# Patient Record
Sex: Female | Born: 1935
Health system: Southern US, Community
[De-identification: ages and names within clinical notes are randomized; demographics above are authoritative.]

## PROBLEM LIST (undated history)

## (undated) DIAGNOSIS — E039 Hypothyroidism, unspecified: Secondary | ICD-10-CM

## (undated) DIAGNOSIS — F0390 Unspecified dementia without behavioral disturbance: Secondary | ICD-10-CM

## (undated) DIAGNOSIS — R0989 Other specified symptoms and signs involving the circulatory and respiratory systems: Secondary | ICD-10-CM

## (undated) DIAGNOSIS — I10 Essential (primary) hypertension: Secondary | ICD-10-CM

## (undated) DIAGNOSIS — E7800 Pure hypercholesterolemia, unspecified: Secondary | ICD-10-CM

## (undated) HISTORY — DX: Other specified symptoms and signs involving the circulatory and respiratory systems: R09.89

## (undated) HISTORY — PX: CARPAL TUNNEL RELEASE: SHX101

## (undated) HISTORY — DX: Essential (primary) hypertension: I10

## (undated) HISTORY — DX: Unspecified dementia, unspecified severity, without behavioral disturbance, psychotic disturbance, mood disturbance, and anxiety: F03.90

## (undated) HISTORY — DX: Hypothyroidism, unspecified: E03.9

## (undated) HISTORY — DX: Pure hypercholesterolemia, unspecified: E78.00

---

## 1989-01-27 HISTORY — PX: CHOLECYSTECTOMY: SHX55

## 2004-05-01 ENCOUNTER — Ambulatory Visit: Payer: Self-pay | Admitting: Internal Medicine

## 2004-05-02 ENCOUNTER — Ambulatory Visit: Payer: Self-pay | Admitting: Internal Medicine

## 2005-04-23 ENCOUNTER — Ambulatory Visit: Payer: Self-pay | Admitting: Internal Medicine

## 2005-05-06 ENCOUNTER — Ambulatory Visit: Payer: Self-pay | Admitting: Internal Medicine

## 2006-05-08 ENCOUNTER — Ambulatory Visit: Payer: Self-pay | Admitting: Internal Medicine

## 2006-06-11 ENCOUNTER — Ambulatory Visit: Payer: Self-pay | Admitting: Gastroenterology

## 2007-01-31 ENCOUNTER — Emergency Department: Payer: Self-pay | Admitting: Emergency Medicine

## 2007-05-11 ENCOUNTER — Ambulatory Visit: Payer: Self-pay | Admitting: Internal Medicine

## 2008-05-18 ENCOUNTER — Ambulatory Visit: Payer: Self-pay | Admitting: Internal Medicine

## 2008-05-23 ENCOUNTER — Ambulatory Visit: Payer: Self-pay | Admitting: Internal Medicine

## 2008-08-24 ENCOUNTER — Ambulatory Visit: Payer: Self-pay | Admitting: Internal Medicine

## 2009-01-30 ENCOUNTER — Emergency Department: Payer: Self-pay | Admitting: Emergency Medicine

## 2009-08-15 ENCOUNTER — Emergency Department: Payer: Self-pay | Admitting: Unknown Physician Specialty

## 2009-12-03 LAB — HM PAP SMEAR: HM Pap smear: NEGATIVE

## 2009-12-11 ENCOUNTER — Ambulatory Visit: Payer: Self-pay | Admitting: Internal Medicine

## 2009-12-13 ENCOUNTER — Ambulatory Visit: Payer: Self-pay | Admitting: Internal Medicine

## 2010-12-30 ENCOUNTER — Ambulatory Visit: Payer: Self-pay | Admitting: Internal Medicine

## 2011-06-05 ENCOUNTER — Emergency Department: Payer: Self-pay | Admitting: Emergency Medicine

## 2011-06-05 LAB — CBC
HCT: 41.4 % (ref 35.0–47.0)
HGB: 13.5 g/dL (ref 12.0–16.0)
MCH: 28.1 pg (ref 26.0–34.0)
MCHC: 32.5 g/dL (ref 32.0–36.0)
MCV: 86 fL (ref 80–100)
Platelet: 245 10*3/uL (ref 150–440)
RBC: 4.79 10*6/uL (ref 3.80–5.20)
RDW: 14 % (ref 11.5–14.5)

## 2011-06-05 LAB — URINALYSIS, COMPLETE
Bilirubin,UR: NEGATIVE
Blood: NEGATIVE
Nitrite: NEGATIVE
Protein: 100

## 2011-06-05 LAB — COMPREHENSIVE METABOLIC PANEL
Albumin: 3.4 g/dL (ref 3.4–5.0)
Alkaline Phosphatase: 104 U/L (ref 50–136)
Anion Gap: 7 (ref 7–16)
BUN: 13 mg/dL (ref 7–18)
Calcium, Total: 9 mg/dL (ref 8.5–10.1)
Co2: 27 mmol/L (ref 21–32)
EGFR (African American): >60
EGFR (Non-African Amer.): >60
Glucose: 137 mg/dL — ABNORMAL HIGH (ref 65–99)
Osmolality: 285 (ref 275–301)
Potassium: 4.2 mmol/L (ref 3.5–5.1)
SGPT (ALT): 17 U/L

## 2011-06-17 ENCOUNTER — Ambulatory Visit: Payer: Self-pay | Admitting: Internal Medicine

## 2011-10-30 ENCOUNTER — Emergency Department: Payer: Self-pay | Admitting: Emergency Medicine

## 2011-10-30 ENCOUNTER — Telehealth: Payer: Self-pay | Admitting: Internal Medicine

## 2011-10-30 DIAGNOSIS — R55 Syncope and collapse: Secondary | ICD-10-CM

## 2011-10-30 NOTE — Telephone Encounter (Signed)
Did EMS take her to er.  If so, need records.  What are specific symptoms now?  How has her blood pressure been running.  Is she still seeing neurology/cardiology?  If so are they aware of this reoccurrence.  Thanks.

## 2011-10-30 NOTE — Telephone Encounter (Signed)
Pt's husband called back he's says that EMS did not take her to ER, She is not having any symptoms as of right now. It only lasted about 10 to 15 min when she was passed out. Her b/p has been around 151/75. She hasn't seen Neuro dr. In a while. She isn't seeing a cardiologist.

## 2011-10-30 NOTE — Telephone Encounter (Signed)
Pt says she had an episode on Monday. She passed out and was saying how bad she felt and she doesn't remember any of it. They called EMS but they were wanting to see someone about this.

## 2011-10-30 NOTE — Telephone Encounter (Signed)
She has had reoccurring episodes like this.  I have previously ordered cardiac testing and holter.  She saw her neurologist - no etiology found.  I would like for her to see a cardiologist asap - (for reoccurring syncope, etc).  If any reoccurring episodes - she needs eval immediately.  Thanks.  If any problems, let us know.

## 2011-10-30 NOTE — Telephone Encounter (Signed)
Left a message for patient to return call.

## 2011-10-31 NOTE — Telephone Encounter (Signed)
Patients spouse advised via telephone, he stated that she hasn't had another syncope episode.  They would like to see the same Cardiologist that she has seen in the past.  They will wait to hear from [place] next week.

## 2011-11-03 NOTE — Telephone Encounter (Signed)
Order for cardiology referral placed.  Refer to Dr Gwen Pounds.

## 2011-11-04 ENCOUNTER — Inpatient Hospital Stay: Payer: Self-pay | Admitting: Internal Medicine

## 2011-11-04 ENCOUNTER — Telehealth: Payer: Self-pay | Admitting: Internal Medicine

## 2011-11-04 LAB — URINALYSIS, COMPLETE
Bacteria: NONE SEEN
Bilirubin,UR: NEGATIVE
Blood: NEGATIVE
Glucose,UR: NEGATIVE mg/dL (ref 0–75)
Ketone: NEGATIVE
Leukocyte Esterase: NEGATIVE
Ph: 7 (ref 4.5–8.0)
RBC,UR: 1 /HPF (ref 0–5)
Specific Gravity: 1.01 (ref 1.003–1.030)
Squamous Epithelial: 8

## 2011-11-04 LAB — COMPREHENSIVE METABOLIC PANEL
Albumin: 3 g/dL — ABNORMAL LOW (ref 3.4–5.0)
Alkaline Phosphatase: 100 U/L (ref 50–136)
Anion Gap: 7 (ref 7–16)
BUN: 14 mg/dL (ref 7–18)
Bilirubin,Total: 0.4 mg/dL (ref 0.2–1.0)
Calcium, Total: 9.1 mg/dL (ref 8.5–10.1)
Chloride: 107 mmol/L (ref 98–107)
Co2: 29 mmol/L (ref 21–32)
Creatinine: 0.97 mg/dL (ref 0.60–1.30)
EGFR (African American): >60
EGFR (Non-African Amer.): 57 — ABNORMAL LOW
Glucose: 103 mg/dL — ABNORMAL HIGH (ref 65–99)
Osmolality: 286 (ref 275–301)
Potassium: 4.2 mmol/L (ref 3.5–5.1)
SGOT(AST): 24 U/L (ref 15–37)
SGPT (ALT): 17 U/L (ref 12–78)
Sodium: 143 mmol/L (ref 136–145)
Total Protein: 7.2 g/dL (ref 6.4–8.2)

## 2011-11-04 LAB — CBC
HCT: 43.9 % (ref 35.0–47.0)
HGB: 14.5 g/dL (ref 12.0–16.0)
MCH: 28.1 pg (ref 26.0–34.0)
MCV: 85 fL (ref 80–100)
RBC: 5.16 10*6/uL (ref 3.80–5.20)

## 2011-11-04 LAB — TROPONIN I
Troponin-I: 0.1 ng/mL — ABNORMAL HIGH
Troponin-I: 0.09 ng/mL — ABNORMAL HIGH

## 2011-11-04 NOTE — Telephone Encounter (Signed)
Mr Amanda [name] came in today to let you know that Ms Amanda [name] had another spell this morning and he had to call ems ,  They came out twice today and once last week,.  The last time that ems came today they took her to [facility]  They admitted her and mr Amanda [name] stated that dr thought he might have been a slight heart attack.  Ms Amanda [name] is in room 421.  It looks like ms Amanda [name] has appointment with [facility] on 11/11/11.  Mr Amanda [name] wanted [place] to cancel this appointment.  Mr Amanda [name] wanted to make sure this was ok to cancel with you.

## 2011-11-04 NOTE — Telephone Encounter (Signed)
Pt in room 241.  Spoke to pt and her husband.  Medicine is planning for a cardiology consult while she is in the hospital.  Will keep scheduled appt for now.  Will keep me posted regarding w/up.

## 2011-11-05 ENCOUNTER — Telehealth: Payer: Self-pay | Admitting: Internal Medicine

## 2011-11-05 DIAGNOSIS — R55 Syncope and collapse: Secondary | ICD-10-CM

## 2011-11-05 LAB — TSH: Thyroid Stimulating Horm: 3.11 u[IU]/mL

## 2011-11-05 LAB — LIPID PANEL
Cholesterol: 194 mg/dL (ref 0–200)
Ldl Cholesterol, Calc: 128 mg/dL — ABNORMAL HIGH (ref 0–100)
Triglycerides: 161 mg/dL (ref 0–200)
VLDL Cholesterol, Calc: 32 mg/dL (ref 5–40)

## 2011-11-05 NOTE — Telephone Encounter (Signed)
Hospital follow up 10.24.13 @ 2:00.

## 2011-11-06 ENCOUNTER — Telehealth: Payer: Self-pay | Admitting: Internal Medicine

## 2011-11-06 NOTE — Telephone Encounter (Signed)
Pt was recently admitted.  She is already scheduled for a hospital follow up with me.

## 2011-11-10 ENCOUNTER — Telehealth: Payer: Self-pay | Admitting: Internal Medicine

## 2011-11-10 NOTE — Telephone Encounter (Signed)
If she is ok to wait until tomorrow - I will see her at 11:30 tomorrow.  Nikki please find out if pt ok to wait - she was just in the hospital.  If ok to wait, then can forward to schedule appt tomorrow.  If unable to wait, she can go to acute care today.  Thanks.

## 2011-11-10 NOTE — Telephone Encounter (Signed)
Pt has UTI and is wanting to know if she should be seen or if she could have something called in to [facility].

## 2011-11-10 NOTE — Telephone Encounter (Signed)
Caller: Crista/Patient; Patient Name: Amanda [name]; PCP: [facility]; Best Callback Phone Number: [phone]; Call regarding Urinary burning, onset 10-12.  All emergent symptoms ruled out per  Urinary Symptoms protocol, see in 24 hrs, due to one or more UTI symptoms and has not been previously evaluated.  No appts on 10-14 or 10-15.  Pt uses [facility], [place], [phone].  Please follow up with Pt.

## 2011-11-10 NOTE — Telephone Encounter (Signed)
Patient and husband notified as instructed by telephone. Was advised by patient's husband that he has an appointment scheduled in the morning with his cancer doctor, but will try to make the 11:30 appointment.

## 2011-11-10 NOTE — Telephone Encounter (Signed)
Patient has an appointment schedule tomorrow at 11:30.

## 2011-11-11 ENCOUNTER — Encounter: Payer: Self-pay | Admitting: Internal Medicine

## 2011-11-11 ENCOUNTER — Ambulatory Visit (INDEPENDENT_AMBULATORY_CARE_PROVIDER_SITE_OTHER): Payer: Medicare Other | Admitting: Internal Medicine

## 2011-11-11 VITALS — BP 122/70 | HR 87 | Temp 97.8°F | Wt 155.0 lb

## 2011-11-11 DIAGNOSIS — E7800 Pure hypercholesterolemia, unspecified: Secondary | ICD-10-CM

## 2011-11-11 DIAGNOSIS — R300 Dysuria: Secondary | ICD-10-CM

## 2011-11-11 DIAGNOSIS — I10 Essential (primary) hypertension: Secondary | ICD-10-CM

## 2011-11-11 DIAGNOSIS — F0390 Unspecified dementia without behavioral disturbance: Secondary | ICD-10-CM

## 2011-11-11 DIAGNOSIS — E039 Hypothyroidism, unspecified: Secondary | ICD-10-CM

## 2011-11-11 LAB — POCT URINALYSIS DIPSTICK
Bilirubin, UA: NEGATIVE
Ketones, UA: NEGATIVE
Nitrite, UA: NEGATIVE
pH, UA: 7

## 2011-11-11 MED ORDER — NITROFURANTOIN MONOHYD MACRO 100 MG PO CAPS: 100.0000 mg | ORAL_CAPSULE | Freq: Two times a day (BID) | ORAL | Status: DC

## 2011-11-11 MED ORDER — NYSTATIN 100000 UNIT/GM EX CREA: TOPICAL_CREAM | Freq: Two times a day (BID) | CUTANEOUS | Status: DC

## 2011-11-11 NOTE — Patient Instructions (Signed)
It was nice seeing you today.  I am sorry you have been having some problems.  I am going to give you an antibiotic today (macrobid).  Take in twice a day.  I also want you to use some nystatin cream on the outside of the vagina to help with the itching.  Let me know if persistent problems.

## 2011-11-12 ENCOUNTER — Ambulatory Visit: Payer: Self-pay | Admitting: Vascular Surgery

## 2011-11-13 ENCOUNTER — Encounter: Payer: Self-pay | Admitting: Internal Medicine

## 2011-11-13 DIAGNOSIS — I10 Essential (primary) hypertension: Secondary | ICD-10-CM | POA: Insufficient documentation

## 2011-11-13 DIAGNOSIS — F0390 Unspecified dementia without behavioral disturbance: Secondary | ICD-10-CM | POA: Insufficient documentation

## 2011-11-13 DIAGNOSIS — E039 Hypothyroidism, unspecified: Secondary | ICD-10-CM | POA: Insufficient documentation

## 2011-11-13 DIAGNOSIS — E7800 Pure hypercholesterolemia, unspecified: Secondary | ICD-10-CM | POA: Insufficient documentation

## 2011-11-13 NOTE — Assessment & Plan Note (Signed)
Seeing neurology.  Continue same med regimen.  Appears to be relatively stable at this point.

## 2011-11-13 NOTE — Assessment & Plan Note (Signed)
Continues on Atorvastatin.  Low cholesterol diet.  Follow lipid panel and liver function.

## 2011-11-13 NOTE — Assessment & Plan Note (Signed)
Continue current thyroid medication.  Follow tsh.

## 2011-11-13 NOTE — Assessment & Plan Note (Signed)
Blood pressure averaging in the 140s (systolid) at home.  138 systolic here (on my check).  Continue current meds as she is doing.  Follow blood pressure.

## 2011-11-13 NOTE — Progress Notes (Signed)
Subjective:    Patient ID: Amanda [name], female    DOB: Mar 17, 1933, 78 y.o.   MRN: [id number]  HPI 78 year old female with past history of hypertension and hypercholesterolemia who comes in today as a work in with concerns regarding a possible urinary tract infection.  These symptoms started 4-5 days ago.  Describes dysuria and some itching.  No discharge.  No intravaginal symptoms.  No hematuria.  She did take AZO prior to coming in.  No abdominal pain or back pain.    She was also recently admitted to [facility] for syncope(?).  Her husband accompanies her.  History obtained from both of them.  She has been having reoccurring episodes (in the morning), where she will start "feeling funny" and "not feeling well".  Will lose consciousness and her "breathing changes" according to her husband.  She saw her neurologist.  Did not feel there was an underlying neurological cause.  Back in the spring, she had a cardiac w/up.  ECHO and stress test unrevealing.  Had a CTA then - did not show a significant blockage.  Recently had more episodes and was admitted - last week.  Dr Mariah Milling evaluated her while in the hospital.  Had a repeat ECHO.  Stress test- not felt warranted at that time.  She apparently did have a follow up carotid ultrasound and is planning to have a follow up CTA tomorrow.  Seeing Dr Wyn Quaker.  Husband reports that the feeling was that these episodes were secondary to blockage and delayed flow to her brain.  These episodes occur in the am.  Husband reports that during the day - she is fine.  Stays active.  Blood pressure now averaging 140s systolic.    Past Medical History  Diagnosis Date  . Hypertension   . Hypercholesterolemia   . Hypothyroidism   . Dementia     Review of Systems Patient denies any headache, lightheadedness or dizziness currently.  No chest pain, tightness or palpitations now.  When these episodes occur, she does notice some chest tightness.  No increased shortness of breath, cough or  congestion.  No nausea or vomiting.  No acid reflux.  No abdominal pain or cramping.  No bowel change, such as diarrhea, constipation, BRBPR or melana.  Urinary symptoms as outlined.  No vaginal symptoms.  Eating and drinking well.         Objective:   Physical Exam Filed Vitals:   11/11/11 1125  BP: 122/70  Pulse: 87  Temp: 97.8 F (41.41 C)   78 year old female in no acute distress.   HEENT:  Nares - clear.  OP- without lesions or erythema.  NECK:  Supple, nontender.   HEART:  Appears to be regular. LUNGS:  Without crackles or wheezing audible.  Respirations even and unlabored.   RADIAL PULSE:  Equal bilaterally.  ABDOMEN:  Soft, nontender.  No audible abdominal bruit.   BACK:  Nontender.  No CVA tenderness.  EXTREMITIES:  No increased edema to be present.                     Assessment & Plan:  UTI.  Urine dip revealed trace leukocytes and trace blood.  Will send for culture.  She is on Amoxicillin for her mouth.  She only has a few pills left.  Will stop the Amoxicillin and start Macrobid 100mg  bid to cover for possible uti.  Fluids.  Explained to her if symptoms changed, worsened or did not resolve, she  was to be reevaluated.    "SPELLS".  Unclear as to the exact etiology.  See above for outline regarding previous w/up.  Her neurologist did not feel these were coming from an underlying neurological etiology.  Previous cardiac w/up negative.  Dr Mariah Milling evaluated her while she was in the hospital.  Had repeat ECHO.  No significant arrhythmia noted while wearing the monitor.  Stress test not felt warranted.  Did have a carotid ultrasound (which per husband did reveal "some blockage").  Planning to have  CTA tomorrow and follow up with Dr Wyn Quaker after the scan.  Continue daily aspirin.  Continue current meds as she is doing.

## 2011-11-14 LAB — URINE CULTURE: Colony Count: 3000

## 2011-11-20 ENCOUNTER — Ambulatory Visit (INDEPENDENT_AMBULATORY_CARE_PROVIDER_SITE_OTHER): Payer: Medicare Other | Admitting: Internal Medicine

## 2011-11-20 ENCOUNTER — Encounter: Payer: Self-pay | Admitting: Internal Medicine

## 2011-11-20 VITALS — BP 140/70 | HR 104 | Temp 97.9°F | Ht 63.0 in | Wt 154.8 lb

## 2011-11-20 DIAGNOSIS — F0390 Unspecified dementia without behavioral disturbance: Secondary | ICD-10-CM

## 2011-11-20 DIAGNOSIS — E039 Hypothyroidism, unspecified: Secondary | ICD-10-CM

## 2011-11-20 DIAGNOSIS — I10 Essential (primary) hypertension: Secondary | ICD-10-CM

## 2011-11-20 DIAGNOSIS — R55 Syncope and collapse: Secondary | ICD-10-CM

## 2011-11-20 MED ORDER — AMLODIPINE BESYLATE 5 MG PO TABS: 5.0000 mg | ORAL_TABLET | Freq: Every day | ORAL | Status: DC

## 2011-11-20 MED ORDER — LEVOTHYROXINE SODIUM 75 MCG PO TABS: 37.5000 ug | ORAL_TABLET | Freq: Every day | ORAL | Status: DC

## 2011-11-20 MED ORDER — LISINOPRIL 10 MG PO TABS: 10.0000 mg | ORAL_TABLET | Freq: Every day | ORAL | Status: DC

## 2011-11-20 NOTE — Patient Instructions (Addendum)
It was nice seeing you again today.  I am glad you have been doing better.  Let us know if any problems.  We will schedule an appt with neurology.

## 2011-11-26 ENCOUNTER — Encounter: Payer: Self-pay | Admitting: Internal Medicine

## 2011-11-26 NOTE — Assessment & Plan Note (Signed)
Blood pressure as outlined.  Continue same med regimen.

## 2011-11-26 NOTE — Assessment & Plan Note (Signed)
Remains on Synthroid.  Follow.   

## 2011-11-26 NOTE — Progress Notes (Signed)
  Subjective:    Patient ID: Amanda [name], female    DOB: 1933-12-15, 78 y.o.   MRN: [id number]  HPI 78 year old female with past history of hypertension, hypercholesterolemia and hypothyroidism who comes in today for a scheduled follow up.  She is accompanied by her husband.  History obtained from both of them.  Was recently admitted to the hospital for continued spells of unresponsiveness.  See last note for details.  Had a CTA of her neck after discharge.  Followed up with vascular surgery.  States everything checked out fine.  Did recently see Dr Gwen Pounds.  Is scheduled to have a stress test next week.  Since her last visit here, she has not had any other "spells".  Has done well.  Feels good.  Breathing stable.  No chest pain or tightness with increased activity or exertion.  Eating and drinking well.  No urinary symptoms now.  Previous symptoms have resolved.  Past Medical History  Diagnosis Date  . Hypertension   . Hypercholesterolemia   . Hypothyroidism   . Dementia     Review of Systems Patient denies any headache, lightheadedness or dizziness.  No chest pain, tightness or palpatations. No increased shortness of breath, cough or congestion.  No acid reflux.  No nausea or vomiting.  No abdominal pain or cramping.  No bowel change, such as diarrhea, constipation, BRBPR or melana.  No urine change.  Specifically denies any dysuria or increased frequency.  Blood pressure has been averaging 140s systolic readings.      Objective:   Physical Exam Filed Vitals:   11/20/11 1422  BP: 140/70  Pulse: 104  Temp: 97.9 F (67.20 C)   78 year old female in no acute distress.   HEENT:  Nares - clear.  OP- without lesions or erythema.  NECK:  Supple, nontender.     HEART:  Appears to be regular. LUNGS:  Without crackles or wheezing audible.  Respirations even and unlabored.   RADIAL PULSE:  Equal bilaterally.  ABDOMEN:  Soft, nontender.  No audible abdominal bruit.   EXTREMITIES:  No increased  edema to be present.      Assessment & Plan:  PERIODS OF UNRESPONSIVE.  Has had cardiac and vascular w/up.  CTA revealed no significant stenosis.  Recent ECHO and previous stress test unrevealing.  Saw Dr Gwen Pounds and is scheduled to have a follow up stress test next week.  These episodes occur in the am.  Have been reoccurring, however she has not had any since her last visit here.  Prior to her admission and a couple of days after her admission, they were occurring frequently.  Blood pressure medicine has been adjusted and it appears now her systolic readings are averaging in the 140s.  (not too low and not too high).  Have asked her husband to continue to monitor her blood pressure and let me know if extreme fluctuations. Still do no have a good answer as to the etiology of the episodes.  Will have her see her neurologist again (given vascular and cardiac w/up negative) to confirm no neurological etiology.  Pt comfortable with this plan.  Continue daily aspirin.   PREVIOUS UTI. Symptoms resolved.  Follow.

## 2011-11-26 NOTE — Assessment & Plan Note (Signed)
[place].  Sees neurology.

## 2011-11-30 ENCOUNTER — Emergency Department: Payer: Self-pay | Admitting: Emergency Medicine

## 2011-12-05 ENCOUNTER — Encounter: Payer: Self-pay | Admitting: Internal Medicine

## 2011-12-08 ENCOUNTER — Other Ambulatory Visit: Payer: Self-pay | Admitting: Internal Medicine

## 2011-12-08 ENCOUNTER — Telehealth: Payer: Self-pay | Admitting: Internal Medicine

## 2011-12-08 NOTE — Telephone Encounter (Signed)
I need hospital records asap to review.  I have no record of her being on these medications.  It is ok to put her on the schedule for the hospital follow up.

## 2011-12-08 NOTE — Telephone Encounter (Signed)
Pt is needing refill on Famotdine 20 mg and Diphenhydramine 25 mg. Pt also has her first appointment with you in January but I found a 30 min appointment next Friday the 22nd would this be ok ???

## 2011-12-08 NOTE — Telephone Encounter (Signed)
Faxed [facility] for records. 

## 2011-12-08 NOTE — Telephone Encounter (Signed)
Famotidine 20 mg and Diphenhydramine 25 mg Pt is needing refill on these meds and she was shceduled in January for her first visit with you from [place] but you had a 30 min slot for next Friday the 22nd would this be ok because she is needing a hospital f/u ???

## 2011-12-08 NOTE — Telephone Encounter (Signed)
I sent Marcelino Duster a message on this pt to get records and clarify meds.  I had a duplicate message.  It is ok to put her on my schedule for then.  Thanks.

## 2011-12-09 NOTE — Telephone Encounter (Signed)
Opened in error

## 2011-12-09 NOTE — Telephone Encounter (Signed)
Pt dropped off the notes from [facility] about pt having hives. She stopped by this morning and said she is itching really badly.

## 2011-12-09 NOTE — Telephone Encounter (Signed)
If increased itching despite prednisone and benadryl needs referral to dermatology. If agreeable - let me know.  If she has a preference of dermatologist she wants to see - let me know.  i will make referral.

## 2011-12-09 NOTE — Telephone Encounter (Signed)
i can see her tomorrow at 2:30 - work in for this

## 2011-12-09 NOTE — Telephone Encounter (Signed)
Called patient to let her know. She stated that she ran out of the medication that she received from [facility]. Her hives have gotten worse she was wanting to see you.

## 2011-12-09 NOTE — Telephone Encounter (Signed)
Left message for patient to return call. Will call back again later.  

## 2011-12-10 ENCOUNTER — Encounter: Payer: Self-pay | Admitting: Internal Medicine

## 2011-12-10 ENCOUNTER — Ambulatory Visit (INDEPENDENT_AMBULATORY_CARE_PROVIDER_SITE_OTHER): Payer: Medicare Other | Admitting: Internal Medicine

## 2011-12-10 VITALS — BP 159/76 | HR 94 | Temp 97.9°F | Ht 63.0 in | Wt 154.2 lb

## 2011-12-10 DIAGNOSIS — M949 Disorder of cartilage, unspecified: Secondary | ICD-10-CM

## 2011-12-10 DIAGNOSIS — I10 Essential (primary) hypertension: Secondary | ICD-10-CM

## 2011-12-10 DIAGNOSIS — M8580 Other specified disorders of bone density and structure, unspecified site: Secondary | ICD-10-CM

## 2011-12-10 NOTE — Patient Instructions (Signed)
It was good seeing you today.  I am sorry you have been having trouble with the rash and itching.  I am going to give you another 6 days of prednisone.  You can also try zyrtec or allegra (is over the counter).  Let me know if a persistent problem.

## 2011-12-14 ENCOUNTER — Encounter: Payer: Self-pay | Admitting: Internal Medicine

## 2011-12-14 DIAGNOSIS — M8580 Other specified disorders of bone density and structure, unspecified site: Secondary | ICD-10-CM | POA: Insufficient documentation

## 2011-12-14 NOTE — Assessment & Plan Note (Signed)
On no medication now and doing well.  Has had no further "spells" per husband.  Feels better.  Follow.

## 2011-12-14 NOTE — Progress Notes (Signed)
  Subjective:    Patient ID: Amanda [name], female    DOB: 1933/11/03, 78 y.o.   MRN: [id number]  HPI 78 year old female with past history of hypercholesterolemia, hypothyroidism and memory loss/mimory disturbance maintained on Aricept.  She comes in today as a work in with concerns regarding persistent hives.  She went to the ER recently.  Was given prednisone taper.  Improved, but then returned.  Denies any new contacts or exposures except for a matress pad.  No facial involvement.  Appears to be more on her legs and arms.  More right side of right leg and right side of left leg.  Increased itching.  No fever or joint aches.  Otherwise doing well.  She has stopped all of her blood pressure medicine.  Blood pressure remaining around 140.  Feels better and has not had any further "spells".  She is accompanied by her husband.  History obtained from both of them.   Past Medical History  Diagnosis Date  . Hypertension   . Hypercholesterolemia   . Hypothyroidism   . Dementia     Review of Systems Patient denies any headache, lightheadedness or dizziness.  No sinus symptoms.   No chest pain, tightness or palpitations.  No increased shortness of breath, cough or congestion.  No nausea or vomiting.  No abdominal pain or cramping.  No bowel change, such as diarrhea, constipation, BRBPR or melana.  No urine change.  Reports the hives and itching as outlined.  No fever or chills.       Objective:   Physical Exam Filed Vitals:   12/10/11 1418  BP: 159/76  Pulse: 94  Temp: 97.9 F (36.6 C)  . 78 year old female in no acute distress.   HEENT:  Nares - clear.  OP- without lesions or erythema.  NECK:  Supple, nontender.    HEART:  Appears to be regular. LUNGS:  Without crackles or wheezing audible.  Respirations even and unlabored.   RADIAL PULSE:  Equal bilaterally.  ABDOMEN:  Soft, nontender.   EXTREMITIES:  No increased edema to be present.              SKIN:  Rash consistent with hives.  More  localized to the armas and legs.  Lateral aspect of right leg and medial aspect of left leg.         Assessment & Plan:  RASH.  Appears to be consistent with hives.  See above.  Will remove the matress pad.  Try to follow for possible triggers or exposures.  Treat with steroid taper and an otc antihistamine.  Follow closely.   Notify me if - does not resolve.

## 2011-12-19 ENCOUNTER — Ambulatory Visit: Payer: Medicare Other | Admitting: Internal Medicine

## 2011-12-28 HISTORY — PX: THORACIC AORTA STENT: SHX2498

## 2012-01-19 ENCOUNTER — Other Ambulatory Visit: Payer: Self-pay | Admitting: Internal Medicine

## 2012-01-19 NOTE — Telephone Encounter (Signed)
Refill Donepezil 10 mg.

## 2012-01-20 MED ORDER — DONEPEZIL HCL 10 MG PO TABS: 5.0000 mg | ORAL_TABLET | Freq: Every day | ORAL | Status: DC

## 2012-01-20 NOTE — Telephone Encounter (Signed)
Sent in to pharmacy.  

## 2012-02-12 ENCOUNTER — Encounter: Payer: Self-pay | Admitting: Internal Medicine

## 2012-02-13 ENCOUNTER — Ambulatory Visit (INDEPENDENT_AMBULATORY_CARE_PROVIDER_SITE_OTHER): Payer: 59 | Admitting: Internal Medicine

## 2012-02-13 ENCOUNTER — Encounter: Payer: Self-pay | Admitting: Internal Medicine

## 2012-02-13 VITALS — BP 140/60 | HR 68 | Temp 97.4°F | Ht 63.0 in | Wt 155.0 lb

## 2012-02-13 DIAGNOSIS — Z139 Encounter for screening, unspecified: Secondary | ICD-10-CM

## 2012-02-13 DIAGNOSIS — E039 Hypothyroidism, unspecified: Secondary | ICD-10-CM

## 2012-02-13 DIAGNOSIS — F0390 Unspecified dementia without behavioral disturbance: Secondary | ICD-10-CM

## 2012-02-13 DIAGNOSIS — I2510 Atherosclerotic heart disease of native coronary artery without angina pectoris: Secondary | ICD-10-CM

## 2012-02-13 DIAGNOSIS — E7800 Pure hypercholesterolemia, unspecified: Secondary | ICD-10-CM

## 2012-02-13 DIAGNOSIS — I10 Essential (primary) hypertension: Secondary | ICD-10-CM

## 2012-02-15 ENCOUNTER — Encounter: Payer: Self-pay | Admitting: Internal Medicine

## 2012-02-15 NOTE — Assessment & Plan Note (Signed)
Stable on aricept.  Continue to follow up with neurology.    

## 2012-02-15 NOTE — Progress Notes (Signed)
Subjective:    Patient ID: Amanda [name], female    DOB: 02/16/1933, 79 y.o.   MRN: [id number]  HPI 79 year old female with past history of hypertension and hypercholesterolemia.  She was just recently admitted to [facility] and found to have CAD s/p stent placement.  She is currently on Plavix.  Feels better.  Doing well.  No further "spells".  No chest pain or tightness.  No sob.  Breathing stable.  Eating and drinking well.  Bowels stable.  No nausea or vomiting. Back on Lisinopril.  Blood pressure has been averaging 140/60.    Past Medical History  Diagnosis Date  . Hypertension   . Hypercholesterolemia   . Hypothyroidism   . Dementia   . Carotid bruit     bilaterally    Outpatient Encounter Prescriptions as of 02/13/2012  Medication Sig Dispense Refill  . atorvastatin (LIPITOR) 80 MG tablet Take 80 mg by mouth daily.      . clopidogrel (PLAVIX) 75 MG tablet Take 75 mg by mouth daily.      Marland Kitchen donepezil (ARICEPT) 10 MG tablet Take 0.5 tablets (5 mg total) by mouth at bedtime.  30 tablet  5  . levothyroxine (SYNTHROID) 75 MCG tablet Take 0.5 tablets (37.5 mcg total) by mouth daily.  90 tablet  3  . lisinopril (PRINIVIL,ZESTRIL) 5 MG tablet Take 5 mg by mouth daily.      . metoprolol succinate (TOPROL-XL) 25 MG 24 hr tablet Take 25 mg by mouth daily.      . [DISCONTINUED] amLODipine (NORVASC) 5 MG tablet Take 1 tablet (5 mg total) by mouth daily.  90 tablet  3  . [DISCONTINUED] amoxicillin (AMOXIL) 500 MG capsule Take 500 mg by mouth 4 (four) times daily.       . [DISCONTINUED] atorvastatin (LIPITOR) 10 MG tablet Take 10 mg by mouth daily.       . [DISCONTINUED] diflunisal (DOLOBID) 500 MG TABS Take 500-1,000 mg by mouth every 12 (twelve) hours as needed.       . [DISCONTINUED] lisinopril (PRINIVIL,ZESTRIL) 10 MG tablet Take 1 tablet (10 mg total) by mouth daily.  90 tablet  3  . [DISCONTINUED] nitrofurantoin, macrocrystal-monohydrate, (MACROBID) 100 MG capsule Take 1 capsule (100 mg total) by  mouth 2 (two) times daily.  10 capsule  0  . [DISCONTINUED] nystatin cream (MYCOSTATIN) Apply topically 2 (two) times daily.  30 g  0    Review of Systems Patient denies any headache, lightheadedness or dizziness. No sinus or allergy symptoms.   No chest pain, tightness or palpitations.  No increased shortness of breath, cough or congestion.  No nausea or vomiting.  No abdominal pain or cramping.  No bowel change, such as diarrhea, constipation, BRBPR or melana.  No urine change.   Blood pressure as outlined.  Feels better after her cardiac intervention.      Objective:   Physical Exam Filed Vitals:   02/13/12 0914  BP: 140/60  Pulse: 68  Temp: 97.4 F (36.3 C)   Blood pressure recheck:  34/66  79 year old female in no acute distress.   HEENT:  Nares - clear.  OP- without lesions or erythema.  NECK:  Supple, nontender.  No audible bruit.   HEART:  Appears to be regular. LUNGS:  Without crackles or wheezing audible.  Respirations even and unlabored.   RADIAL PULSE:  Equal bilaterally.  ABDOMEN:  Soft, nontender.  No audible abdominal bruit.   EXTREMITIES:  No increased edema to  be present.                    Assessment & Plan:  HEALTH MAINTENANCE.  Last physical 12/17/10.  Colonoscopy 06/11/06  - normal.  Mammogram 12/30/10 - BiRADS II.  Schedule a follow up mammogram.

## 2012-02-15 NOTE — Assessment & Plan Note (Signed)
On lipitor.  Just adjusted.  Check lipid panel and liver panel with next fasting labs.

## 2012-02-15 NOTE — Assessment & Plan Note (Signed)
Blood pressure is under good control.  Same medication.  Follow.   

## 2012-03-19 ENCOUNTER — Ambulatory Visit: Payer: Self-pay | Admitting: Internal Medicine

## 2012-04-05 ENCOUNTER — Encounter: Payer: Self-pay | Admitting: Internal Medicine

## 2012-05-06 ENCOUNTER — Telehealth: Payer: Self-pay | Admitting: Internal Medicine

## 2012-05-06 NOTE — Telephone Encounter (Signed)
Left message on voicemail asking for a return call. Please call Clydie Braun from [facility] at [phone] ext. [phone]

## 2012-05-13 ENCOUNTER — Encounter: Payer: Self-pay | Admitting: Internal Medicine

## 2012-05-13 ENCOUNTER — Ambulatory Visit (INDEPENDENT_AMBULATORY_CARE_PROVIDER_SITE_OTHER): Payer: 59 | Admitting: Internal Medicine

## 2012-05-13 VITALS — BP 140/70 | HR 88 | Temp 97.8°F | Ht 62.5 in | Wt 148.5 lb

## 2012-05-13 DIAGNOSIS — I10 Essential (primary) hypertension: Secondary | ICD-10-CM

## 2012-05-13 DIAGNOSIS — M899 Disorder of bone, unspecified: Secondary | ICD-10-CM

## 2012-05-13 DIAGNOSIS — E039 Hypothyroidism, unspecified: Secondary | ICD-10-CM

## 2012-05-13 DIAGNOSIS — M8580 Other specified disorders of bone density and structure, unspecified site: Secondary | ICD-10-CM

## 2012-05-13 DIAGNOSIS — E7800 Pure hypercholesterolemia, unspecified: Secondary | ICD-10-CM

## 2012-05-13 DIAGNOSIS — I2510 Atherosclerotic heart disease of native coronary artery without angina pectoris: Secondary | ICD-10-CM

## 2012-05-13 DIAGNOSIS — F0390 Unspecified dementia without behavioral disturbance: Secondary | ICD-10-CM

## 2012-05-15 ENCOUNTER — Encounter: Payer: Self-pay | Admitting: Internal Medicine

## 2012-05-15 DIAGNOSIS — I2510 Atherosclerotic heart disease of native coronary artery without angina pectoris: Secondary | ICD-10-CM | POA: Insufficient documentation

## 2012-05-15 NOTE — Assessment & Plan Note (Signed)
Stable on aricept.  Continue to follow up with neurology.    

## 2012-05-15 NOTE — Assessment & Plan Note (Signed)
S/p stent placement.  Off plavix.  Continues follow up with Dr Gwen Pounds.  Continue aggressive risk factor modification.  Currently doing well.  Asymptomatic.

## 2012-05-15 NOTE — Assessment & Plan Note (Signed)
Calcium.  Vitamin D.  Weight bearing exercise.    

## 2012-05-15 NOTE — Assessment & Plan Note (Signed)
Blood pressure is under good control.  Same medication.  Follow.  Check metabolic panel.

## 2012-05-15 NOTE — Progress Notes (Signed)
  Subjective:    Patient ID: Amanda [name], female    DOB: Feb 05, 1933, 79 y.o.   MRN: [id number]  HPI 79 year old female with past history of hypertension and hypercholesterolemia.  She was just recently admitted to [facility] and found to have CAD s/p stent placement.  She comes in today to follow up on these issues as well as for a complete physical exam.  She is accompanied by her husband.  History obtained from both of them.  States she is doing well.  No dizziness or lightheadedness.  No chest pain or tightness.  No further "spells".  Breathing stable.  No nausea or vomiting.  No bowel change.  Overall feels good.  Is followed by  Dr Gwen Pounds.  Off plavix.    Past Medical History  Diagnosis Date  . Hypertension   . Hypercholesterolemia   . Hypothyroidism   . Dementia   . Carotid bruit     bilaterally    Outpatient Encounter Prescriptions as of 05/13/2012  Medication Sig Dispense Refill  . aspirin 81 MG tablet Take 81 mg by mouth daily.      Marland Kitchen atorvastatin (LIPITOR) 80 MG tablet Take 80 mg by mouth daily.      Marland Kitchen donepezil (ARICEPT) 10 MG tablet Take 0.5 tablets (5 mg total) by mouth at bedtime.  30 tablet  5  . levothyroxine (SYNTHROID) 75 MCG tablet Take 0.5 tablets (37.5 mcg total) by mouth daily.  90 tablet  3  . lisinopril (PRINIVIL,ZESTRIL) 5 MG tablet Take 5 mg by mouth daily.      . metoprolol succinate (TOPROL-XL) 25 MG 24 hr tablet Take 25 mg by mouth daily.      . [DISCONTINUED] clopidogrel (PLAVIX) 75 MG tablet Take 75 mg by mouth daily.       No facility-administered encounter medications on file as of 05/13/2012.    Review of Systems Patient denies any headache, lightheadedness or dizziness. No sinus or allergy symptoms.   No chest pain, tightness or palpitations.  No increased shortness of breath, cough or congestion.  No nausea or vomiting.  No acid reflux.  No abdominal pain or cramping.  No bowel change, such as diarrhea, constipation, BRBPR or melana.  No urine change.    Blood pressures doing well.  Feels better after her cardiac intervention.  Overall feels she is doing well.      Objective:   Physical Exam  Filed Vitals:   05/13/12 1013  BP: 140/70  Pulse: 88  Temp: 97.8 F (36.6 C)   Blood pressure recheck:  33/88  79 year old female in no acute distress.   HEENT:  Nares- clear.  Oropharynx - without lesions. NECK:  Supple.  Nontender.  No audible bruit.  HEART:  Appears to be regular. LUNGS:  No crackles or wheezing audible.  Respirations even and unlabored.  RADIAL PULSE:  Equal bilaterally.    BREASTS:  No nipple discharge or nipple retraction present.  Could not appreciate any distinct nodules or axillary adenopathy.  ABDOMEN:  Soft, nontender.  Bowel sounds present and normal.  No audible abdominal bruit.  GU:  She declined.   EXTREMITIES:  No increased edema present.  DP pulses palpable and equal bilaterally.            Assessment & Plan:  HEALTH MAINTENANCE.  Physical today.  Colonoscopy 06/11/06  - normal.  Mammogram 03/19/12 - BiRADS II.

## 2012-05-15 NOTE — Assessment & Plan Note (Signed)
Remains on Synthroid.  Follow.   

## 2012-05-15 NOTE — Assessment & Plan Note (Signed)
On lipitor.  Just adjusted.  Check lipid panel and liver panel with next fasting labs.

## 2012-05-27 ENCOUNTER — Other Ambulatory Visit (INDEPENDENT_AMBULATORY_CARE_PROVIDER_SITE_OTHER): Payer: 59

## 2012-05-27 DIAGNOSIS — E039 Hypothyroidism, unspecified: Secondary | ICD-10-CM

## 2012-05-27 DIAGNOSIS — I10 Essential (primary) hypertension: Secondary | ICD-10-CM

## 2012-05-27 DIAGNOSIS — I2510 Atherosclerotic heart disease of native coronary artery without angina pectoris: Secondary | ICD-10-CM

## 2012-05-27 DIAGNOSIS — E7800 Pure hypercholesterolemia, unspecified: Secondary | ICD-10-CM

## 2012-05-27 LAB — CBC WITH DIFFERENTIAL/PLATELET
Basophils Absolute: 0 10*3/uL (ref 0.0–0.1)
Basophils Relative: 0.5 % (ref 0.0–3.0)
Hemoglobin: 13.4 g/dL (ref 12.0–15.0)
Lymphocytes Relative: 21.8 % (ref 12.0–46.0)
Monocytes Relative: 8.4 % (ref 3.0–12.0)
Neutro Abs: 5.2 10*3/uL (ref 1.4–7.7)
RBC: 4.81 Mil/uL (ref 3.87–5.11)
RDW: 14.6 % (ref 11.5–14.6)

## 2012-05-27 LAB — BASIC METABOLIC PANEL
CO2: 30 mEq/L (ref 19–32)
Chloride: 103 mEq/L (ref 96–112)
Creatinine, Ser: 0.8 mg/dL (ref 0.4–1.2)

## 2012-05-27 LAB — LIPID PANEL
Cholesterol: 120 mg/dL (ref 0–200)
LDL Cholesterol: 64 mg/dL (ref 0–99)
Total CHOL/HDL Ratio: 3

## 2012-05-27 LAB — HEPATIC FUNCTION PANEL
ALT: 12 U/L (ref 0–35)
AST: 16 U/L (ref 0–37)
Alkaline Phosphatase: 90 U/L (ref 39–117)
Total Bilirubin: 0.9 mg/dL (ref 0.3–1.2)

## 2012-05-28 ENCOUNTER — Encounter: Payer: Self-pay | Admitting: *Deleted

## 2012-06-10 ENCOUNTER — Ambulatory Visit (INDEPENDENT_AMBULATORY_CARE_PROVIDER_SITE_OTHER): Payer: Medicare Other | Admitting: Internal Medicine

## 2012-06-10 ENCOUNTER — Encounter: Payer: Self-pay | Admitting: Internal Medicine

## 2012-06-10 VITALS — BP 130/64 | HR 64 | Temp 97.8°F | Ht 62.5 in

## 2012-06-10 DIAGNOSIS — M899 Disorder of bone, unspecified: Secondary | ICD-10-CM

## 2012-06-10 DIAGNOSIS — E039 Hypothyroidism, unspecified: Secondary | ICD-10-CM

## 2012-06-10 DIAGNOSIS — I10 Essential (primary) hypertension: Secondary | ICD-10-CM

## 2012-06-10 DIAGNOSIS — E0789 Other specified disorders of thyroid: Secondary | ICD-10-CM

## 2012-06-10 DIAGNOSIS — F0390 Unspecified dementia without behavioral disturbance: Secondary | ICD-10-CM

## 2012-06-10 DIAGNOSIS — E7800 Pure hypercholesterolemia, unspecified: Secondary | ICD-10-CM

## 2012-06-10 DIAGNOSIS — M8580 Other specified disorders of bone density and structure, unspecified site: Secondary | ICD-10-CM

## 2012-06-10 DIAGNOSIS — I2510 Atherosclerotic heart disease of native coronary artery without angina pectoris: Secondary | ICD-10-CM

## 2012-06-14 ENCOUNTER — Encounter: Payer: Self-pay | Admitting: Internal Medicine

## 2012-06-14 NOTE — Assessment & Plan Note (Signed)
Blood pressure is under good control.  Same medication.  Follow.  Follow metabolic panel.   

## 2012-06-14 NOTE — Assessment & Plan Note (Signed)
On lipitor.  Lipid profile 05/27/12 revealed total cholesterol 120, triglycerides 90, HDL 38 and LDL 64.  Continue current med regimen and diet/exercise.

## 2012-06-14 NOTE — Progress Notes (Signed)
  Subjective:    Patient ID: Amanda [name], female    DOB: Jan 25, 1934, 79 y.o.   MRN: [id number]  HPI 79 year old female with past history of hypertension and hypercholesterolemia.  She was just recently admitted to [facility] and found to have CAD s/p stent placement.  She comes in today for a scheduled follow up. She is accompanied by her husband.  History obtained from both of them.  States she is doing well.  No dizziness or lightheadedness.  No chest pain or tightness.  No further "spells".  Breathing stable.  No nausea or vomiting.  No bowel change.  Overall feels good.  Is followed by  Dr Gwen Pounds.   Past Medical History  Diagnosis Date  . Hypertension   . Hypercholesterolemia   . Hypothyroidism   . Dementia   . Carotid bruit     bilaterally    Outpatient Encounter Prescriptions as of 06/10/2012  Medication Sig Dispense Refill  . aspirin 325 MG EC tablet Take 325 mg by mouth daily.      Marland Kitchen atorvastatin (LIPITOR) 80 MG tablet Take 80 mg by mouth daily.      Marland Kitchen donepezil (ARICEPT) 10 MG tablet Take 0.5 tablets (5 mg total) by mouth at bedtime.  30 tablet  5  . levothyroxine (SYNTHROID) 75 MCG tablet Take 0.5 tablets (37.5 mcg total) by mouth daily.  90 tablet  3  . lisinopril (PRINIVIL,ZESTRIL) 5 MG tablet Take 5 mg by mouth daily.      . metoprolol succinate (TOPROL-XL) 25 MG 24 hr tablet Take 25 mg by mouth daily.      . [DISCONTINUED] aspirin 81 MG tablet Take 81 mg by mouth daily.       No facility-administered encounter medications on file as of 06/10/2012.    Review of Systems Patient denies any headache, lightheadedness or dizziness. No sinus or allergy symptoms.   No chest pain, tightness or palpitations.  No increased shortness of breath, cough or congestion.  No nausea or vomiting.  No acid reflux.  No abdominal pain or cramping.  No bowel change, such as diarrhea, constipation, BRBPR or melana.  No urine change.   Blood pressures doing well.  Overall feels she is doing well.  Trying  to stay active.      Objective:   Physical Exam  Filed Vitals:   06/10/12 1342  BP: 130/64  Pulse: 64  Temp: 97.8 F (57.8 C)   79 year old female in no acute distress.   HEENT:  Nares- clear.  Oropharynx - without lesions. NECK:  Supple.  Nontender.  No audible bruit.  Increased thyroid fullness.  Non tender.  HEART:  Appears to be regular. LUNGS:  No crackles or wheezing audible.  Respirations even and unlabored.  RADIAL PULSE:  Equal bilaterally.   ABDOMEN:  Soft, nontender.  Bowel sounds present and normal.  No audible abdominal bruit.   EXTREMITIES:  No increased edema present.  DP pulses palpable and equal bilaterally.            Assessment & Plan:  NECK FULLNESS.  Question of thyroid fullness.  Check thyroid ultrasound.  On levothyroxine.  Recent tsh wnl.   HEALTH MAINTENANCE.  Physical last visit.  Colonoscopy 06/11/06  - normal.  Mammogram 03/19/12 - BiRADS II.

## 2012-06-14 NOTE — Assessment & Plan Note (Signed)
Stable on aricept.  Continue to follow up with neurology.    

## 2012-06-14 NOTE — Assessment & Plan Note (Signed)
S/p stent placement.  Off plavix.  Continues follow up with Dr Kowalski.  Continue aggressive risk factor modification.  Currently doing well.  Asymptomatic.   

## 2012-06-14 NOTE — Assessment & Plan Note (Signed)
Remains on Synthroid.  Follow.   

## 2012-06-14 NOTE — Assessment & Plan Note (Signed)
Calcium.  Vitamin D.  Weight bearing exercise.    

## 2012-06-16 ENCOUNTER — Ambulatory Visit: Payer: Self-pay | Admitting: Internal Medicine

## 2012-06-19 ENCOUNTER — Telehealth: Payer: Self-pay | Admitting: Internal Medicine

## 2012-06-19 DIAGNOSIS — E041 Nontoxic single thyroid nodule: Secondary | ICD-10-CM

## 2012-06-19 NOTE — Telephone Encounter (Signed)
pts husband notified of thyroid ultrasound results.  Left nodule 1.4cm - needs referral to endocrinology.  Order placed for Dr Renae Fickle to evaluate and biopsy if needed.

## 2012-11-12 ENCOUNTER — Ambulatory Visit (INDEPENDENT_AMBULATORY_CARE_PROVIDER_SITE_OTHER): Payer: Medicare Other | Admitting: Internal Medicine

## 2012-11-12 ENCOUNTER — Encounter: Payer: Self-pay | Admitting: Internal Medicine

## 2012-11-12 VITALS — BP 130/60 | HR 63 | Temp 97.8°F | Ht 62.5 in | Wt 152.5 lb

## 2012-11-12 DIAGNOSIS — M8580 Other specified disorders of bone density and structure, unspecified site: Secondary | ICD-10-CM

## 2012-11-12 DIAGNOSIS — E039 Hypothyroidism, unspecified: Secondary | ICD-10-CM

## 2012-11-12 DIAGNOSIS — I10 Essential (primary) hypertension: Secondary | ICD-10-CM

## 2012-11-12 DIAGNOSIS — E7800 Pure hypercholesterolemia, unspecified: Secondary | ICD-10-CM

## 2012-11-12 DIAGNOSIS — F0390 Unspecified dementia without behavioral disturbance: Secondary | ICD-10-CM

## 2012-11-12 DIAGNOSIS — I2510 Atherosclerotic heart disease of native coronary artery without angina pectoris: Secondary | ICD-10-CM

## 2012-11-12 DIAGNOSIS — Z23 Encounter for immunization: Secondary | ICD-10-CM

## 2012-11-12 DIAGNOSIS — M899 Disorder of bone, unspecified: Secondary | ICD-10-CM

## 2012-11-12 NOTE — Progress Notes (Signed)
  Subjective:    Patient ID: Amanda [name], female    DOB: [birth year], 79 y.o.   MRN: [id number]  HPI 79 year old female with past history of hypertension and hypercholesterolemia.  She was just recently admitted to [facility] and found to have CAD and is s/p stent placement.  She comes in today for a scheduled follow up. She is accompanied by her husband.  History obtained from both of them.  States she is doing well.  No dizziness or lightheadedness.  No chest pain or tightness.  No further "spells".  Breathing stable.  No nausea or vomiting.  No bowel change.  Overall feels good.  Is followed by  Dr Gwen Pounds.   Past Medical History  Diagnosis Date  . Hypertension   . Hypercholesterolemia   . Hypothyroidism   . Dementia   . Carotid bruit     bilaterally    Outpatient Encounter Prescriptions as of 11/12/2012  Medication Sig Dispense Refill  . aspirin 325 MG EC tablet Take 325 mg by mouth daily.      Marland Kitchen atorvastatin (LIPITOR) 80 MG tablet Take 80 mg by mouth daily.      Marland Kitchen donepezil (ARICEPT) 10 MG tablet Take 0.5 tablets (5 mg total) by mouth at bedtime.  30 tablet  5  . levothyroxine (SYNTHROID) 75 MCG tablet Take 0.5 tablets (37.5 mcg total) by mouth daily.  90 tablet  3  . lisinopril (PRINIVIL,ZESTRIL) 5 MG tablet Take 5 mg by mouth daily.      . metoprolol succinate (TOPROL-XL) 25 MG 24 hr tablet Take 25 mg by mouth daily.       No facility-administered encounter medications on file as of 11/12/2012.    Review of Systems Patient denies any headache, lightheadedness or dizziness. No sinus or allergy symptoms.   No chest pain, tightness or palpitations.  No increased shortness of breath, cough or congestion.  No nausea or vomiting.  No acid reflux.  No abdominal pain or cramping.  No bowel change, such as diarrhea, constipation, BRBPR or melana.  No urine change.   Blood pressures doing well.  Overall feels she is doing well.  Trying to stay active.  Is planning cataract surgery soon.  Has been  for her pre op evaluation.  We discussed contacting cardiology - for their clearance.       Objective:   Physical Exam  Filed Vitals:   11/12/12 0853  BP: 130/60  Pulse: 63  Temp: 97.8 F (36.6 C)   Blood pressure recheck:  148/64, pulse 37  79 year old female in no acute distress.   HEENT:  Nares- clear.  Oropharynx - without lesions. NECK:  Supple.  Nontender.   Increased thyroid fullness.  Non tender.  HEART:  Appears to be regular. LUNGS:  No crackles or wheezing audible.  Respirations even and unlabored.  RADIAL PULSE:  Equal bilaterally.   ABDOMEN:  Soft, nontender.  Bowel sounds present and normal.  No audible abdominal bruit.   EXTREMITIES:  No increased edema present.  DP pulses palpable and equal bilaterally.            Assessment & Plan:  NECK FULLNESS.  On levothyroxine.  Recent tsh wnl.   Followed by Dr Renae Fickle.  Follow planned in 1/15.   HEALTH MAINTENANCE.  Physical 05/13/12.  Colonoscopy 06/11/06  - normal.  Mammogram 03/19/12 - BiRADS II.

## 2012-11-12 NOTE — Assessment & Plan Note (Signed)
Stable on aricept.  Continue to follow up with neurology.    

## 2012-11-12 NOTE — Assessment & Plan Note (Signed)
Calcium.  Vitamin D.  Weight bearing exercise.    

## 2012-11-12 NOTE — Assessment & Plan Note (Addendum)
S/p stent placement.  Off plavix.  Continues follow up with Dr Gwen Pounds.  Continue aggressive risk factor modification.  Currently doing well.  Asymptomatic.  Has follow up planned with Dr Gwen Pounds in 12/14.  Instructed to contact him regarding up coming surgery - for cardiac clearance and recs.

## 2012-11-12 NOTE — Assessment & Plan Note (Signed)
On lipitor.  Lipid profile 05/27/12 revealed total cholesterol 120, triglycerides 90, HDL 38 and LDL 64.  Continue current med regimen and diet/exercise.

## 2012-11-12 NOTE — Assessment & Plan Note (Signed)
Remains on Synthroid.  Follow.   

## 2012-11-12 NOTE — Assessment & Plan Note (Signed)
Blood pressure is under good control.  Same medication.  Follow.  Follow metabolic panel.   

## 2012-11-14 ENCOUNTER — Encounter: Payer: Self-pay | Admitting: Internal Medicine

## 2012-11-17 ENCOUNTER — Ambulatory Visit: Payer: Self-pay | Admitting: Ophthalmology

## 2012-11-22 ENCOUNTER — Other Ambulatory Visit: Payer: Self-pay | Admitting: *Deleted

## 2012-11-22 MED ORDER — LEVOTHYROXINE SODIUM 75 MCG PO TABS: 37.5000 ug | ORAL_TABLET | Freq: Every day | ORAL | Status: DC

## 2012-11-22 NOTE — Telephone Encounter (Signed)
Eprescribed.

## 2012-12-01 ENCOUNTER — Other Ambulatory Visit: Payer: Self-pay | Admitting: *Deleted

## 2012-12-01 MED ORDER — DONEPEZIL HCL 10 MG PO TABS: 5.0000 mg | ORAL_TABLET | Freq: Every day | ORAL | Status: DC

## 2012-12-01 NOTE — Telephone Encounter (Signed)
Eprescribed.

## 2012-12-14 ENCOUNTER — Telehealth: Payer: Self-pay | Admitting: *Deleted

## 2012-12-14 DIAGNOSIS — E039 Hypothyroidism, unspecified: Secondary | ICD-10-CM

## 2012-12-14 DIAGNOSIS — E7800 Pure hypercholesterolemia, unspecified: Secondary | ICD-10-CM

## 2012-12-14 DIAGNOSIS — I10 Essential (primary) hypertension: Secondary | ICD-10-CM

## 2012-12-14 DIAGNOSIS — M8580 Other specified disorders of bone density and structure, unspecified site: Secondary | ICD-10-CM

## 2012-12-14 NOTE — Telephone Encounter (Signed)
Pt is coming in tomorrow what labs and dx?  

## 2012-12-14 NOTE — Telephone Encounter (Signed)
Orders placed for labs

## 2012-12-15 ENCOUNTER — Other Ambulatory Visit (INDEPENDENT_AMBULATORY_CARE_PROVIDER_SITE_OTHER): Payer: Medicare Other

## 2012-12-15 DIAGNOSIS — M8580 Other specified disorders of bone density and structure, unspecified site: Secondary | ICD-10-CM

## 2012-12-15 DIAGNOSIS — E039 Hypothyroidism, unspecified: Secondary | ICD-10-CM

## 2012-12-15 DIAGNOSIS — I10 Essential (primary) hypertension: Secondary | ICD-10-CM

## 2012-12-15 DIAGNOSIS — E7800 Pure hypercholesterolemia, unspecified: Secondary | ICD-10-CM

## 2012-12-15 LAB — BASIC METABOLIC PANEL
BUN: 14 mg/dL (ref 6–23)
CO2: 30 mEq/L (ref 19–32)
Calcium: 9.8 mg/dL (ref 8.4–10.5)
Creatinine, Ser: 0.8 mg/dL (ref 0.4–1.2)
GFR: 77.14 mL/min (ref >60.00)
Glucose, Bld: 106 mg/dL — ABNORMAL HIGH (ref 70–99)
Sodium: 138 mEq/L (ref 135–145)

## 2012-12-15 LAB — HEPATIC FUNCTION PANEL
ALT: 21 U/L (ref 0–35)
AST: 21 U/L (ref 0–37)
Alkaline Phosphatase: 96 U/L (ref 39–117)
Bilirubin, Direct: 0.2 mg/dL (ref 0.0–0.3)
Total Bilirubin: 1.2 mg/dL (ref 0.3–1.2)

## 2012-12-15 LAB — TSH: TSH: 2.29 u[IU]/mL (ref 0.35–5.50)

## 2012-12-15 LAB — LIPID PANEL
HDL: 40.2 mg/dL (ref >39.00)
Total CHOL/HDL Ratio: 3
Triglycerides: 106 mg/dL (ref 0.0–149.0)

## 2012-12-16 ENCOUNTER — Other Ambulatory Visit: Payer: Self-pay | Admitting: Internal Medicine

## 2012-12-16 ENCOUNTER — Encounter: Payer: Self-pay | Admitting: *Deleted

## 2012-12-16 DIAGNOSIS — E876 Hypokalemia: Secondary | ICD-10-CM

## 2012-12-16 DIAGNOSIS — R739 Hyperglycemia, unspecified: Secondary | ICD-10-CM

## 2012-12-16 NOTE — Progress Notes (Signed)
Orders placed for f/u labs.  

## 2012-12-29 ENCOUNTER — Other Ambulatory Visit: Payer: Self-pay | Admitting: *Deleted

## 2013-01-13 ENCOUNTER — Other Ambulatory Visit (INDEPENDENT_AMBULATORY_CARE_PROVIDER_SITE_OTHER): Payer: Medicare Other

## 2013-01-13 DIAGNOSIS — R739 Hyperglycemia, unspecified: Secondary | ICD-10-CM

## 2013-01-13 DIAGNOSIS — R7309 Other abnormal glucose: Secondary | ICD-10-CM

## 2013-01-13 DIAGNOSIS — E876 Hypokalemia: Secondary | ICD-10-CM

## 2013-01-13 LAB — HEMOGLOBIN A1C: Hgb A1c MFr Bld: 6.6 % — ABNORMAL HIGH (ref 4.6–6.5)

## 2013-01-14 LAB — GLUCOSE, FASTING: Glucose, Fasting: 100 mg/dL — ABNORMAL HIGH (ref 70–99)

## 2013-01-17 ENCOUNTER — Ambulatory Visit (INDEPENDENT_AMBULATORY_CARE_PROVIDER_SITE_OTHER): Payer: Medicare Other | Admitting: Internal Medicine

## 2013-01-17 ENCOUNTER — Encounter: Payer: Self-pay | Admitting: Internal Medicine

## 2013-01-17 VITALS — BP 120/70 | HR 69 | Temp 97.9°F | Ht 62.5 in | Wt 149.5 lb

## 2013-01-17 DIAGNOSIS — M899 Disorder of bone, unspecified: Secondary | ICD-10-CM

## 2013-01-17 DIAGNOSIS — E7800 Pure hypercholesterolemia, unspecified: Secondary | ICD-10-CM

## 2013-01-17 DIAGNOSIS — I2510 Atherosclerotic heart disease of native coronary artery without angina pectoris: Secondary | ICD-10-CM

## 2013-01-17 DIAGNOSIS — I10 Essential (primary) hypertension: Secondary | ICD-10-CM

## 2013-01-17 DIAGNOSIS — F0390 Unspecified dementia without behavioral disturbance: Secondary | ICD-10-CM

## 2013-01-17 DIAGNOSIS — M8580 Other specified disorders of bone density and structure, unspecified site: Secondary | ICD-10-CM

## 2013-01-17 DIAGNOSIS — E039 Hypothyroidism, unspecified: Secondary | ICD-10-CM

## 2013-01-17 NOTE — Progress Notes (Signed)
Pre-visit discussion using our clinic review tool. No additional management support is needed unless otherwise documented below in the visit note.  

## 2013-01-21 ENCOUNTER — Encounter: Payer: Self-pay | Admitting: Internal Medicine

## 2013-01-21 NOTE — Progress Notes (Signed)
  Subjective:    Patient ID: Amanda [name], female    DOB: [birth year], 79 y.o.   MRN: [id number]  HPI 79 year old female with past history of hypertension and hypercholesterolemia.  She was just recently admitted to [facility] and found to have CAD and is s/p stent placement.  She comes in today for a f/u to discuss elevated a1c.  She is accompanied by her husband.  History obtained from both of them.  States she is doing well.  [name] dizziness or lightheadedness.  [name] chest pain or tightness.  [name] further "spells".  Breathing stable.  [name] nausea or vomiting.  [name] bowel change.  Overall feels good.  Is followed by  Dr Gwen Pounds.  We discussed her recent elevated a1c.  Discussed diabetes.  Discussed low carb diet.  Discussed [facility] referral and monitoring sugars.  They desire to just try diet adjustment first.     Past Medical History  Diagnosis Date  . Hypertension   . Hypercholesterolemia   . Hypothyroidism   . Dementia   . Carotid bruit     bilaterally    Outpatient Encounter Prescriptions as of 01/17/2013  Medication Sig  . aspirin 325 MG EC tablet Take 325 mg by mouth daily.  Marland Kitchen atorvastatin (LIPITOR) 80 MG tablet Take 80 mg by mouth daily.  Marland Kitchen donepezil (ARICEPT) 10 MG tablet Take 0.5 tablets (5 mg total) by mouth at bedtime.  Marland Kitchen levothyroxine (SYNTHROID) 75 MCG tablet Take 0.5 tablets (37.5 mcg total) by mouth daily.  Marland Kitchen lisinopril (PRINIVIL,ZESTRIL) 5 MG tablet Take 5 mg by mouth daily.  . metoprolol succinate (TOPROL-XL) 25 MG 24 hr tablet Take 25 mg by mouth daily.    Review of Systems Patient denies any headache, lightheadedness or dizziness. [name] sinus or allergy symptoms.   [name] chest pain, tightness or palpitations.  [name] increased shortness of breath, cough or congestion.  [name] nausea or vomiting.  [name] acid reflux.  [name] abdominal pain or cramping.  [name] bowel change, such as diarrhea, constipation, BRBPR or melana.  [name] urine change.   Blood pressures doing well.  Overall feels she is doing well.  Trying  to stay active.  Discussed low carb diet.  Eats a lot of bread.  [name] soft drinks.        Objective:   Physical Exam  Filed Vitals:   01/17/13 1434  BP: 120/70  Pulse: 69  Temp: 97.9 F (36.6 C)   Blood pressure recheck:  148/64, pulse 15  79 year old female in [name] acute distress.   HEENT:  Nares- clear.  Oropharynx - without lesions. NECK:  Supple.  Nontender.   Increased thyroid fullness.  Non tender.  HEART:  Appears to be regular. LUNGS:  [name] crackles or wheezing audible.  Respirations even and unlabored.  RADIAL PULSE:  Equal bilaterally.   ABDOMEN:  Soft, nontender.  Bowel sounds present and normal.  [name] audible abdominal bruit.   EXTREMITIES:  [name] increased edema present.  DP pulses palpable and equal bilaterally.            Assessment & Plan:  NECK FULLNESS.  On levothyroxine.  Recent tsh wnl.   Followed by Dr Renae Fickle.  Follow planned in 1/15.   HEALTH MAINTENANCE.  Physical 05/13/12.  Colonoscopy 06/11/06  - normal.  Mammogram 03/19/12 - BiRADS II.

## 2013-01-21 NOTE — Assessment & Plan Note (Signed)
Blood pressure is under good control.  Same medication.  Follow.  Follow metabolic panel.   

## 2013-01-21 NOTE — Assessment & Plan Note (Signed)
Calcium.  Vitamin D.  Weight bearing exercise.    

## 2013-01-21 NOTE — Assessment & Plan Note (Signed)
Remains on Synthroid.  Follow.   

## 2013-01-21 NOTE — Assessment & Plan Note (Signed)
S/p stent placement.  Off plavix.  Continues follow up with Dr Gwen Pounds.  Continue aggressive risk factor modification.  Currently doing well.  Asymptomatic.  Continues follow up with Dr Gwen Pounds.

## 2013-01-21 NOTE — Assessment & Plan Note (Signed)
Stable on aricept.  Continue to follow up with neurology.    

## 2013-01-21 NOTE — Assessment & Plan Note (Signed)
On lipitor.  Lipid profile 12/15/12 revealed total cholesterol 129, triglycerides 106, HDL 40 and LDL 68.  Continue current med regimen and diet/exercise.

## 2013-02-01 ENCOUNTER — Other Ambulatory Visit: Payer: Self-pay | Admitting: *Deleted

## 2013-02-02 MED ORDER — ATORVASTATIN CALCIUM 80 MG PO TABS: 80.0000 mg | ORAL_TABLET | Freq: Every day | ORAL | Status: DC

## 2013-02-02 MED ORDER — LISINOPRIL 5 MG PO TABS: 5.0000 mg | ORAL_TABLET | Freq: Every day | ORAL | Status: DC

## 2013-02-02 MED ORDER — LEVOTHYROXINE SODIUM 75 MCG PO TABS: 37.5000 ug | ORAL_TABLET | Freq: Every day | ORAL | Status: DC

## 2013-02-09 ENCOUNTER — Ambulatory Visit: Payer: Self-pay | Admitting: Ophthalmology

## 2013-03-03 ENCOUNTER — Other Ambulatory Visit: Payer: Self-pay | Admitting: *Deleted

## 2013-03-03 MED ORDER — METOPROLOL SUCCINATE ER 25 MG PO TB24: 25.0000 mg | ORAL_TABLET | Freq: Every day | ORAL | Status: DC

## 2013-04-28 ENCOUNTER — Telehealth: Payer: Self-pay | Admitting: Internal Medicine

## 2013-04-28 NOTE — Telephone Encounter (Signed)
Came into office.  States needs refill of medication for memory.  They cannot remember the name of the medication.  States [facility] has sent over request with no response.

## 2013-04-28 NOTE — Telephone Encounter (Signed)
I spoke with pharmacy & notified them that all Rx's should still have refills remaining. She stated that the Aricept was being requested for refill too soon by the patient. I verified that directions are 1/2 tablet QHS #30. I also gave a verbal refill on the Levothyroxine (was sent in January '15). Tried to notify patient & phone number remained busy.

## 2013-05-17 ENCOUNTER — Encounter: Payer: Self-pay | Admitting: Internal Medicine

## 2013-05-17 ENCOUNTER — Ambulatory Visit (INDEPENDENT_AMBULATORY_CARE_PROVIDER_SITE_OTHER): Payer: Medicare Other | Admitting: Internal Medicine

## 2013-05-17 VITALS — BP 110/70 | HR 52 | Temp 97.7°F | Ht 63.0 in | Wt 135.0 lb

## 2013-05-17 DIAGNOSIS — I10 Essential (primary) hypertension: Secondary | ICD-10-CM

## 2013-05-17 DIAGNOSIS — Z23 Encounter for immunization: Secondary | ICD-10-CM

## 2013-05-17 DIAGNOSIS — E7800 Pure hypercholesterolemia, unspecified: Secondary | ICD-10-CM

## 2013-05-17 DIAGNOSIS — F0390 Unspecified dementia without behavioral disturbance: Secondary | ICD-10-CM

## 2013-05-17 DIAGNOSIS — E1159 Type 2 diabetes mellitus with other circulatory complications: Secondary | ICD-10-CM | POA: Insufficient documentation

## 2013-05-17 DIAGNOSIS — E119 Type 2 diabetes mellitus without complications: Secondary | ICD-10-CM

## 2013-05-17 DIAGNOSIS — E039 Hypothyroidism, unspecified: Secondary | ICD-10-CM

## 2013-05-17 DIAGNOSIS — M949 Disorder of cartilage, unspecified: Secondary | ICD-10-CM

## 2013-05-17 DIAGNOSIS — M899 Disorder of bone, unspecified: Secondary | ICD-10-CM

## 2013-05-17 DIAGNOSIS — I2510 Atherosclerotic heart disease of native coronary artery without angina pectoris: Secondary | ICD-10-CM

## 2013-05-17 DIAGNOSIS — Z1239 Encounter for other screening for malignant neoplasm of breast: Secondary | ICD-10-CM

## 2013-05-17 DIAGNOSIS — M8580 Other specified disorders of bone density and structure, unspecified site: Secondary | ICD-10-CM

## 2013-05-17 LAB — LIPID PANEL
CHOL/HDL RATIO: 3
Cholesterol: 109 mg/dL (ref 0–200)
HDL: 38.6 mg/dL — AB (ref >39.00)
LDL CALC: 59 mg/dL (ref 0–99)
Triglycerides: 58 mg/dL (ref 0.0–149.0)
VLDL: 11.6 mg/dL (ref 0.0–40.0)

## 2013-05-17 LAB — HEMOGLOBIN A1C: Hgb A1c MFr Bld: 6.2 % (ref 4.6–6.5)

## 2013-05-17 LAB — MICROALBUMIN / CREATININE URINE RATIO
CREATININE, U: 223.8 mg/dL
MICROALB UR: 1.3 mg/dL (ref 0.0–1.9)
MICROALB/CREAT RATIO: 0.6 mg/g (ref 0.0–30.0)

## 2013-05-17 LAB — CBC WITH DIFFERENTIAL/PLATELET
BASOS PCT: 0.7 % (ref 0.0–3.0)
Basophils Absolute: 0 10*3/uL (ref 0.0–0.1)
EOS PCT: 3.3 % (ref 0.0–5.0)
Eosinophils Absolute: 0.2 10*3/uL (ref 0.0–0.7)
HCT: 40.3 % (ref 36.0–46.0)
HEMOGLOBIN: 13.1 g/dL (ref 12.0–15.0)
LYMPHS PCT: 23.8 % (ref 12.0–46.0)
Lymphs Abs: 1.3 10*3/uL (ref 0.7–4.0)
MCHC: 32.6 g/dL (ref 30.0–36.0)
MCV: 87.1 fl (ref 78.0–100.0)
MONOS PCT: 10.3 % (ref 3.0–12.0)
Monocytes Absolute: 0.6 10*3/uL (ref 0.1–1.0)
NEUTROS ABS: 3.5 10*3/uL (ref 1.4–7.7)
NEUTROS PCT: 61.9 % (ref 43.0–77.0)
Platelets: 230 10*3/uL (ref 150.0–400.0)
RBC: 4.62 Mil/uL (ref 3.87–5.11)
RDW: 15.3 % — ABNORMAL HIGH (ref 11.5–14.6)
WBC: 5.6 10*3/uL (ref 4.5–10.5)

## 2013-05-17 LAB — BASIC METABOLIC PANEL
BUN: 14 mg/dL (ref 6–23)
CO2: 29 mEq/L (ref 19–32)
Calcium: 9.6 mg/dL (ref 8.4–10.5)
Chloride: 103 mEq/L (ref 96–112)
Creatinine, Ser: 0.7 mg/dL (ref 0.4–1.2)
GFR: 88.94 mL/min (ref >60.00)
Glucose, Bld: 94 mg/dL (ref 70–99)
Potassium: 5.1 mEq/L (ref 3.5–5.1)
Sodium: 141 mEq/L (ref 135–145)

## 2013-05-17 LAB — HEPATIC FUNCTION PANEL
ALT: 148 U/L — ABNORMAL HIGH (ref 0–35)
AST: 172 U/L — ABNORMAL HIGH (ref 0–37)
Albumin: 3.5 g/dL (ref 3.5–5.2)
Alkaline Phosphatase: 170 U/L — ABNORMAL HIGH (ref 39–117)
Bilirubin, Direct: 0.1 mg/dL (ref 0.0–0.3)
Total Bilirubin: 0.7 mg/dL (ref 0.3–1.2)
Total Protein: 6.9 g/dL (ref 6.0–8.3)

## 2013-05-17 NOTE — Progress Notes (Signed)
Subjective:    Patient ID: Junius CreamerJean Cappiello, female    DOB: May 23, 1933, 80 y.o.   MRN: [id number]  HPI 80 year old female with past history of hypertension and hypercholesterolemia.  She also has known CAD and is s/p stent placement.  She comes in today to follow up on these issues as well as for a complete physical exam.  She is accompanied by her husband.  History obtained from both of them.  No dizziness or lightheadedness.  No chest pain or tightness.  No further "spells".  Breathing stable.  No nausea or vomiting.  No bowel change.  Overall feels good.  Is followed by  Dr Gwen PoundsKowalski.  Previously had elevated a1c.  Has adjusted her diet.  Stays active.  Overall she feels she is doing well.      Past Medical History  Diagnosis Date  . Hypertension   . Hypercholesterolemia   . Hypothyroidism   . Dementia   . Carotid bruit     bilaterally    Outpatient Encounter Prescriptions as of 05/17/2013  Medication Sig  . aspirin 325 MG EC tablet Take 325 mg by mouth daily.  Marland Kitchen. atorvastatin (LIPITOR) 80 MG tablet Take 1 tablet (80 mg total) by mouth daily.  Marland Kitchen. donepezil (ARICEPT) 10 MG tablet Take 0.5 tablets (5 mg total) by mouth at bedtime.  Marland Kitchen. levothyroxine (SYNTHROID) 75 MCG tablet Take 0.5 tablets (37.5 mcg total) by mouth daily.  Marland Kitchen. lisinopril (PRINIVIL,ZESTRIL) 5 MG tablet Take 1 tablet (5 mg total) by mouth daily.  . metoprolol succinate (TOPROL-XL) 25 MG 24 hr tablet Take 1 tablet (25 mg total) by mouth daily.    Review of Systems Patient denies any headache, lightheadedness or dizziness.  No sinus or allergy symptoms.   No chest pain, tightness or palpitations.  No increased shortness of breath, cough or congestion.  No nausea or vomiting.  No acid reflux.  No abdominal pain or cramping.  No bowel change, such as diarrhea, constipation, BRBPR or melana.  No urine change.   Blood pressures doing well.  Overall feels she is doing well.  Trying to stay active.  Has adjusted her diet.  Has lost weight.  Eating regular meals.         Objective:   Physical Exam  Filed Vitals:   05/17/13 1027  BP: 110/70  Pulse: 52  Temp: 97.7 F (36.5 C)   Blood pressure recheck:  59140/2372  80 year old female in no acute distress.   HEENT:  Nares- clear.  Oropharynx - without lesions. NECK:  Supple.  Nontender.  No audible bruit.  HEART:  Appears to be regular. LUNGS:  No crackles or wheezing audible.  Respirations even and unlabored.  RADIAL PULSE:  Equal bilaterally.    BREASTS:  No nipple discharge or nipple retraction present.  Could not appreciate any distinct nodules or axillary adenopathy.  ABDOMEN:  Soft, nontender.  Bowel sounds present and normal.  No audible abdominal bruit.  GU:  Not performed.     EXTREMITIES:  No increased edema present.  DP pulses palpable and equal bilaterally.      FEET:  No skin lesions.        Assessment & Plan:  NECK FULLNESS.  On levothyroxine.  Recent tsh wnl.   Followed by Dr Renae FicklePaul.  Did not keep follow up.    HEALTH MAINTENANCE.  Physical today.  Colonoscopy 06/11/06  - normal.  Mammogram 03/19/12 - BiRADS II.  Schedule a f/u mammogram.

## 2013-05-17 NOTE — Progress Notes (Signed)
Pre visit review using our clinic review tool, if applicable. No additional management support is needed unless otherwise documented below in the visit note. 

## 2013-05-18 ENCOUNTER — Other Ambulatory Visit: Payer: Self-pay | Admitting: Internal Medicine

## 2013-05-18 DIAGNOSIS — R945 Abnormal results of liver function studies: Secondary | ICD-10-CM

## 2013-05-18 DIAGNOSIS — R7989 Other specified abnormal findings of blood chemistry: Secondary | ICD-10-CM

## 2013-05-18 NOTE — Progress Notes (Signed)
Order placed for f/u liver panel.  

## 2013-05-19 ENCOUNTER — Encounter: Payer: Self-pay | Admitting: Emergency Medicine

## 2013-05-21 ENCOUNTER — Encounter: Payer: Self-pay | Admitting: Internal Medicine

## 2013-05-21 NOTE — Assessment & Plan Note (Signed)
Calcium.  Vitamin D.  Weight bearing exercise.

## 2013-05-21 NOTE — Assessment & Plan Note (Signed)
Remains on Synthroid.  Follow.

## 2013-05-21 NOTE — Assessment & Plan Note (Signed)
S/p stent placement.  Off plavix.  Continue aggressive risk factor modification.  Currently doing well.  Asymptomatic.  Continues follow up with Dr Gwen PoundsKowalski.

## 2013-05-21 NOTE — Assessment & Plan Note (Signed)
On lipitor.  Continue current med regimen and diet/exercise.   Check lipid panel and liver function.

## 2013-05-21 NOTE — Assessment & Plan Note (Signed)
Has adjusted her diet.  Has lost weight.  Eating and drinking well.  Has cut out sugars.  Follow.  Check met b and a1c.

## 2013-05-21 NOTE — Assessment & Plan Note (Signed)
Blood pressure is under good control.  Same medication.  Follow.  Follow metabolic panel.

## 2013-05-21 NOTE — Assessment & Plan Note (Signed)
Stable on aricept.  Continue to follow up with neurology.

## 2013-05-24 ENCOUNTER — Other Ambulatory Visit (INDEPENDENT_AMBULATORY_CARE_PROVIDER_SITE_OTHER): Payer: Medicare Other

## 2013-05-24 DIAGNOSIS — R945 Abnormal results of liver function studies: Secondary | ICD-10-CM

## 2013-05-24 DIAGNOSIS — R7989 Other specified abnormal findings of blood chemistry: Secondary | ICD-10-CM

## 2013-05-24 LAB — HEPATIC FUNCTION PANEL
ALT: 123 U/L — ABNORMAL HIGH (ref 0–35)
AST: 132 U/L — ABNORMAL HIGH (ref 0–37)
Albumin: 3.4 g/dL — ABNORMAL LOW (ref 3.5–5.2)
Alkaline Phosphatase: 141 U/L — ABNORMAL HIGH (ref 39–117)
Bilirubin, Direct: 0 mg/dL (ref 0.0–0.3)
Total Bilirubin: 0.4 mg/dL (ref 0.3–1.2)
Total Protein: 6.5 g/dL (ref 6.0–8.3)

## 2013-05-25 ENCOUNTER — Telehealth: Payer: Self-pay | Admitting: Internal Medicine

## 2013-05-25 ENCOUNTER — Other Ambulatory Visit: Payer: Self-pay | Admitting: Internal Medicine

## 2013-05-25 DIAGNOSIS — R7989 Other specified abnormal findings of blood chemistry: Secondary | ICD-10-CM

## 2013-05-25 DIAGNOSIS — R945 Abnormal results of liver function studies: Secondary | ICD-10-CM

## 2013-05-25 NOTE — Telephone Encounter (Signed)
Order placed for abdominal ultrasound.   

## 2013-05-25 NOTE — Progress Notes (Signed)
Order placed for f/u labs.  

## 2013-05-31 ENCOUNTER — Ambulatory Visit: Payer: Self-pay | Admitting: Internal Medicine

## 2013-05-31 LAB — HM MAMMOGRAPHY: HM MAMMO: NEGATIVE

## 2013-06-01 ENCOUNTER — Encounter: Payer: Self-pay | Admitting: Internal Medicine

## 2013-06-07 ENCOUNTER — Ambulatory Visit: Payer: Self-pay | Admitting: Internal Medicine

## 2013-06-08 ENCOUNTER — Other Ambulatory Visit (INDEPENDENT_AMBULATORY_CARE_PROVIDER_SITE_OTHER): Payer: Medicare Other

## 2013-06-08 ENCOUNTER — Telehealth: Payer: Self-pay | Admitting: Internal Medicine

## 2013-06-08 ENCOUNTER — Encounter: Payer: Self-pay | Admitting: *Deleted

## 2013-06-08 DIAGNOSIS — R7989 Other specified abnormal findings of blood chemistry: Secondary | ICD-10-CM

## 2013-06-08 DIAGNOSIS — R945 Abnormal results of liver function studies: Secondary | ICD-10-CM

## 2013-06-08 LAB — HEPATIC FUNCTION PANEL
ALK PHOS: 101 U/L (ref 39–117)
ALT: 54 U/L — ABNORMAL HIGH (ref 0–35)
AST: 56 U/L — AB (ref 0–37)
Albumin: 3.4 g/dL — ABNORMAL LOW (ref 3.5–5.2)
BILIRUBIN TOTAL: 0.6 mg/dL (ref 0.2–1.2)
Bilirubin, Direct: 0 mg/dL (ref 0.0–0.3)
Total Protein: 6.2 g/dL (ref 6.0–8.3)

## 2013-06-08 NOTE — Telephone Encounter (Signed)
See phone message.  I placed the order for the referral to GI for abnormal liver function tests.  [facility]

## 2013-06-08 NOTE — Telephone Encounter (Signed)
Notify pts husband that Ms Amanda [name] recent abdominal ultrasound was negative - did not reveal any acute abnormality.  Should be off lipitor now.  Given the persistent elevation in her liver tests, I would like to refer her to GI for further evaluation.  If agreeable, let me know and I will place the order for the referral.  Should be coming in soon for repeat liver tests.  (looks like scheduled today 06/08/13).

## 2013-06-08 NOTE — Telephone Encounter (Signed)
Pt notified of results while in office today for labs. Pt willing to proceed with referral & would prefer to go somewhere in [place].

## 2013-06-09 ENCOUNTER — Encounter: Payer: Self-pay | Admitting: *Deleted

## 2013-06-19 NOTE — Telephone Encounter (Signed)
I have sent information to [facility] on 06/11/13 all other documentation will be in the note section of the referral.

## 2013-06-24 ENCOUNTER — Encounter: Payer: Self-pay | Admitting: Internal Medicine

## 2013-07-20 ENCOUNTER — Ambulatory Visit: Payer: Self-pay | Admitting: Gastroenterology

## 2013-07-20 LAB — CREATININE, SERUM
CREATININE: 0.81 mg/dL (ref 0.60–1.30)
EGFR (African American): >60
EGFR (Non-African Amer.): >60

## 2013-08-29 ENCOUNTER — Other Ambulatory Visit: Payer: Self-pay | Admitting: *Deleted

## 2013-08-29 MED ORDER — METOPROLOL SUCCINATE ER 25 MG PO TB24: 25.0000 mg | ORAL_TABLET | Freq: Every day | ORAL | Status: DC

## 2013-09-21 ENCOUNTER — Encounter: Payer: Self-pay | Admitting: Internal Medicine

## 2013-09-21 ENCOUNTER — Ambulatory Visit (INDEPENDENT_AMBULATORY_CARE_PROVIDER_SITE_OTHER): Payer: Medicare Other | Admitting: Internal Medicine

## 2013-09-21 VITALS — BP 120/60 | HR 60 | Temp 97.5°F | Ht 63.0 in | Wt 135.5 lb

## 2013-09-21 DIAGNOSIS — M8580 Other specified disorders of bone density and structure, unspecified site: Secondary | ICD-10-CM

## 2013-09-21 DIAGNOSIS — M899 Disorder of bone, unspecified: Secondary | ICD-10-CM

## 2013-09-21 DIAGNOSIS — M949 Disorder of cartilage, unspecified: Secondary | ICD-10-CM

## 2013-09-21 DIAGNOSIS — I10 Essential (primary) hypertension: Secondary | ICD-10-CM

## 2013-09-21 DIAGNOSIS — Z23 Encounter for immunization: Secondary | ICD-10-CM

## 2013-09-21 DIAGNOSIS — E7800 Pure hypercholesterolemia, unspecified: Secondary | ICD-10-CM

## 2013-09-21 DIAGNOSIS — E119 Type 2 diabetes mellitus without complications: Secondary | ICD-10-CM

## 2013-09-21 DIAGNOSIS — I2510 Atherosclerotic heart disease of native coronary artery without angina pectoris: Secondary | ICD-10-CM

## 2013-09-21 DIAGNOSIS — F0390 Unspecified dementia without behavioral disturbance: Secondary | ICD-10-CM

## 2013-09-21 DIAGNOSIS — E039 Hypothyroidism, unspecified: Secondary | ICD-10-CM

## 2013-09-21 MED ORDER — DONEPEZIL HCL 10 MG PO TABS: 10.0000 mg | ORAL_TABLET | Freq: Every day | ORAL | Status: DC

## 2013-09-21 NOTE — Progress Notes (Signed)
  Subjective:    Patient ID: Amanda [name], female    DOB: 1933-07-21, 80 y.o.   MRN: [id number]  HPI 80 year old female with past history of hypertension and hypercholesterolemia.  She also has known CAD and is s/p stent placement.  She comes in today for a scheduled follow up.   She is accompanied by her husband.  History obtained from both of them.  No dizziness or lightheadedness.  No chest pain or tightness.  No further "spells".  Breathing stable.  No nausea or vomiting.  No bowel change.  Overall feels good.  Is followed by Dr Gwen Pounds.  Previously had elevated a1c.  Has adjusted her diet.  Stays active.  Overall she feels she is doing well.   Husband states that her blood pressure was running lower a few days ago (116/48).  He held her lisinopril for a couple of days.  Back on now.  She is feeling good.     Past Medical History  Diagnosis Date  . Hypertension   . Hypercholesterolemia   . Hypothyroidism   . Dementia   . Carotid bruit     bilaterally    Outpatient Encounter Prescriptions as of 09/21/2013  Medication Sig  . aspirin 325 MG EC tablet Take 325 mg by mouth daily.  Marland Kitchen atorvastatin (LIPITOR) 80 MG tablet Take 1 tablet (80 mg total) by mouth daily.  Marland Kitchen donepezil (ARICEPT) 10 MG tablet Take 0.5 tablets (5 mg total) by mouth at bedtime.  Marland Kitchen levothyroxine (SYNTHROID) 75 MCG tablet Take 0.5 tablets (37.5 mcg total) by mouth daily.  Marland Kitchen lisinopril (PRINIVIL,ZESTRIL) 5 MG tablet Take 1 tablet (5 mg total) by mouth daily.  . metoprolol succinate (TOPROL-XL) 25 MG 24 hr tablet Take 1 tablet (25 mg total) by mouth daily.    Review of Systems Patient denies any headache, lightheadedness or dizziness.  No sinus or allergy symptoms.   No chest pain, tightness or palpitations.  No increased shortness of breath, cough or congestion.  No nausea or vomiting.  No acid reflux.  No abdominal pain or cramping.  No bowel change, such as diarrhea, constipation, BRBPR or melana.  No urine change.    Blood pressure as outlined.  Overall feels she is doing well.  Trying to stay active.  Has adjusted her diet.        Objective:   Physical Exam  Filed Vitals:   09/21/13 0930  BP: 120/60  Pulse: 60  Temp: 97.5 F (36.4 C)   Blood pressure recheck:  69/69  80 year old female in no acute distress.   HEENT:  Nares- clear.  Oropharynx - without lesions. NECK:  Supple.  Nontender.  No audible bruit.  HEART:  Appears to be regular. LUNGS:  No crackles or wheezing audible.  Respirations even and unlabored.  RADIAL PULSE:  Equal bilaterally.   ABDOMEN:  Soft, nontender.  Bowel sounds present and normal.  No audible abdominal bruit.    EXTREMITIES:  No increased edema present.  DP pulses palpable and equal bilaterally.      FEET:  No skin lesions.        Assessment & Plan:  NECK FULLNESS.  On levothyroxine.  Recent tsh wnl.   Was followed by Dr Renae Fickle.     HEALTH MAINTENANCE.  Physical 05/17/13  Colonoscopy 06/11/06  - normal.  Mammogram 05/31/13 - BiRADS I.

## 2013-09-21 NOTE — Progress Notes (Signed)
Pre visit review using our clinic review tool, if applicable. No additional management support is needed unless otherwise documented below in the visit note. 

## 2013-09-25 ENCOUNTER — Encounter: Payer: Self-pay | Admitting: Internal Medicine

## 2013-09-25 NOTE — Assessment & Plan Note (Signed)
Remains on Synthroid.  Follow.   

## 2013-09-25 NOTE — Assessment & Plan Note (Signed)
On lipitor.  Continue current med regimen and diet/exercise.   Check lipid panel and liver function.     

## 2013-09-25 NOTE — Assessment & Plan Note (Signed)
Blood pressure has been under good control.  Apparently checked low a few days ago.  Husband held the lisinopril for a couple of days.  Back on lisinopril now.  Blood pressure looks good.  Will continue same medication regimen for now.  Follow pressures.  Follow metabolic panel.

## 2013-09-25 NOTE — Assessment & Plan Note (Signed)
Relatively stable on aricept.  Husband would like to increase aricept to  q day.  Will increase.  Follow.  Continue to follow up with neurology.

## 2013-09-25 NOTE — Assessment & Plan Note (Signed)
S/p stent placement.  Off plavix.  Continue aggressive risk factor modification.  Currently doing well.  Asymptomatic.  Continues follow up with Dr Kowalski.    

## 2013-09-25 NOTE — Assessment & Plan Note (Signed)
Has adjusted her diet.  Eating and drinking well.  Follow.  Check met b and a1c.

## 2013-09-25 NOTE — Assessment & Plan Note (Signed)
Calcium.  Vitamin D.  Weight bearing exercise.    

## 2013-09-28 ENCOUNTER — Other Ambulatory Visit (INDEPENDENT_AMBULATORY_CARE_PROVIDER_SITE_OTHER): Payer: Medicare Other

## 2013-09-28 ENCOUNTER — Other Ambulatory Visit: Payer: Self-pay | Admitting: Internal Medicine

## 2013-09-28 DIAGNOSIS — E039 Hypothyroidism, unspecified: Secondary | ICD-10-CM

## 2013-09-28 DIAGNOSIS — E7800 Pure hypercholesterolemia, unspecified: Secondary | ICD-10-CM

## 2013-09-28 DIAGNOSIS — E119 Type 2 diabetes mellitus without complications: Secondary | ICD-10-CM

## 2013-09-28 LAB — TSH: TSH: 3 u[IU]/mL (ref 0.35–4.50)

## 2013-09-28 LAB — LIPID PANEL
CHOLESTEROL: 196 mg/dL (ref 0–200)
HDL: 46.6 mg/dL (ref >39.00)
LDL CALC: 127 mg/dL — AB (ref 0–99)
NonHDL: 149.4
TRIGLYCERIDES: 110 mg/dL (ref 0.0–149.0)
Total CHOL/HDL Ratio: 4
VLDL: 22 mg/dL (ref 0.0–40.0)

## 2013-09-28 LAB — BASIC METABOLIC PANEL
BUN: 11 mg/dL (ref 6–23)
CALCIUM: 9.4 mg/dL (ref 8.4–10.5)
CO2: 30 mEq/L (ref 19–32)
Chloride: 105 mEq/L (ref 96–112)
Creatinine, Ser: 0.8 mg/dL (ref 0.4–1.2)
GFR: 78.15 mL/min (ref >60.00)
GLUCOSE: 91 mg/dL (ref 70–99)
Potassium: 4.5 mEq/L (ref 3.5–5.1)
SODIUM: 142 meq/L (ref 135–145)

## 2013-09-28 LAB — HEPATIC FUNCTION PANEL
ALK PHOS: 80 U/L (ref 39–117)
ALT: 14 U/L (ref 0–35)
AST: 21 U/L (ref 0–37)
Albumin: 3.6 g/dL (ref 3.5–5.2)
BILIRUBIN DIRECT: 0.1 mg/dL (ref 0.0–0.3)
Total Bilirubin: 0.9 mg/dL (ref 0.2–1.2)
Total Protein: 6.6 g/dL (ref 6.0–8.3)

## 2013-09-28 LAB — HEMOGLOBIN A1C: Hgb A1c MFr Bld: 6.2 % (ref 4.6–6.5)

## 2013-09-28 NOTE — Progress Notes (Signed)
Order placed for f/u labs.  

## 2013-09-29 ENCOUNTER — Other Ambulatory Visit: Payer: Self-pay | Admitting: *Deleted

## 2013-09-29 MED ORDER — PRAVASTATIN SODIUM 10 MG PO TABS: 10.0000 mg | ORAL_TABLET | Freq: Every day | ORAL | Status: DC

## 2013-10-04 ENCOUNTER — Other Ambulatory Visit: Payer: Self-pay | Admitting: *Deleted

## 2013-10-04 MED ORDER — LISINOPRIL 5 MG PO TABS: 5.0000 mg | ORAL_TABLET | Freq: Every day | ORAL | Status: DC

## 2013-10-20 ENCOUNTER — Other Ambulatory Visit (INDEPENDENT_AMBULATORY_CARE_PROVIDER_SITE_OTHER): Payer: Medicare Other

## 2013-10-20 DIAGNOSIS — E7800 Pure hypercholesterolemia, unspecified: Secondary | ICD-10-CM

## 2013-10-20 LAB — HEPATIC FUNCTION PANEL
ALBUMIN: 3.9 g/dL (ref 3.5–5.2)
ALT: 18 U/L (ref 0–35)
AST: 25 U/L (ref 0–37)
Alkaline Phosphatase: 85 U/L (ref 39–117)
Bilirubin, Direct: 0.2 mg/dL (ref 0.0–0.3)
Total Bilirubin: 0.7 mg/dL (ref 0.2–1.2)
Total Protein: 6.4 g/dL (ref 6.0–8.3)

## 2013-11-09 ENCOUNTER — Telehealth: Payer: Self-pay | Admitting: *Deleted

## 2013-11-09 DIAGNOSIS — E7800 Pure hypercholesterolemia, unspecified: Secondary | ICD-10-CM

## 2013-11-09 NOTE — Telephone Encounter (Signed)
Order placed for labs.

## 2013-11-09 NOTE — Telephone Encounter (Signed)
Pt is coming in tomorrow what labs and dx?  

## 2013-11-10 ENCOUNTER — Other Ambulatory Visit (INDEPENDENT_AMBULATORY_CARE_PROVIDER_SITE_OTHER): Payer: Medicare Other

## 2013-11-10 DIAGNOSIS — E7800 Pure hypercholesterolemia, unspecified: Secondary | ICD-10-CM

## 2013-11-10 LAB — HEPATIC FUNCTION PANEL
ALT: 15 U/L (ref 0–35)
AST: 23 U/L (ref 0–37)
Albumin: 3.6 g/dL (ref 3.5–5.2)
Alkaline Phosphatase: 79 U/L (ref 39–117)
BILIRUBIN TOTAL: 0.7 mg/dL (ref 0.2–1.2)
Bilirubin, Direct: 0 mg/dL (ref 0.0–0.3)
Total Protein: 7.2 g/dL (ref 6.0–8.3)

## 2013-11-11 ENCOUNTER — Encounter: Payer: Self-pay | Admitting: *Deleted

## 2013-11-11 ENCOUNTER — Other Ambulatory Visit: Payer: Self-pay | Admitting: Internal Medicine

## 2013-11-11 DIAGNOSIS — Z79899 Other long term (current) drug therapy: Secondary | ICD-10-CM

## 2013-11-11 NOTE — Progress Notes (Signed)
Order placed for f/u liver panel.  

## 2013-12-12 ENCOUNTER — Other Ambulatory Visit: Payer: Medicare Other

## 2013-12-13 ENCOUNTER — Other Ambulatory Visit (INDEPENDENT_AMBULATORY_CARE_PROVIDER_SITE_OTHER): Payer: Medicare Other

## 2013-12-13 DIAGNOSIS — Z79899 Other long term (current) drug therapy: Secondary | ICD-10-CM

## 2013-12-14 ENCOUNTER — Encounter: Payer: Self-pay | Admitting: *Deleted

## 2013-12-14 LAB — HEPATIC FUNCTION PANEL
ALT: 16 U/L (ref 0–35)
AST: 20 U/L (ref 0–37)
Albumin: 3.7 g/dL (ref 3.5–5.2)
Alkaline Phosphatase: 72 U/L (ref 39–117)
BILIRUBIN DIRECT: 0.1 mg/dL (ref 0.0–0.3)
Total Bilirubin: 0.5 mg/dL (ref 0.2–1.2)
Total Protein: 6.5 g/dL (ref 6.0–8.3)

## 2013-12-19 ENCOUNTER — Other Ambulatory Visit: Payer: Self-pay | Admitting: *Deleted

## 2013-12-19 MED ORDER — PRAVASTATIN SODIUM 10 MG PO TABS: 10.0000 mg | ORAL_TABLET | Freq: Every day | ORAL | Status: DC

## 2014-01-24 ENCOUNTER — Ambulatory Visit: Payer: Medicare Other | Admitting: Internal Medicine

## 2014-03-16 ENCOUNTER — Encounter: Payer: Self-pay | Admitting: Internal Medicine

## 2014-03-16 ENCOUNTER — Ambulatory Visit (INDEPENDENT_AMBULATORY_CARE_PROVIDER_SITE_OTHER): Payer: Medicare Other | Admitting: Internal Medicine

## 2014-03-16 VITALS — BP 137/62 | HR 57 | Temp 97.4°F | Ht 63.0 in | Wt 132.2 lb

## 2014-03-16 DIAGNOSIS — I10 Essential (primary) hypertension: Secondary | ICD-10-CM

## 2014-03-16 DIAGNOSIS — R739 Hyperglycemia, unspecified: Secondary | ICD-10-CM

## 2014-03-16 DIAGNOSIS — E039 Hypothyroidism, unspecified: Secondary | ICD-10-CM

## 2014-03-16 DIAGNOSIS — I2510 Atherosclerotic heart disease of native coronary artery without angina pectoris: Secondary | ICD-10-CM

## 2014-03-16 DIAGNOSIS — E7800 Pure hypercholesterolemia, unspecified: Secondary | ICD-10-CM

## 2014-03-16 MED ORDER — LEVOTHYROXINE SODIUM 75 MCG PO TABS: 37.5000 ug | ORAL_TABLET | Freq: Every day | ORAL | Status: DC

## 2014-03-16 MED ORDER — PRAVASTATIN SODIUM 10 MG PO TABS: 10.0000 mg | ORAL_TABLET | Freq: Every day | ORAL | Status: DC

## 2014-03-16 MED ORDER — DONEPEZIL HCL 10 MG PO TABS: 10.0000 mg | ORAL_TABLET | Freq: Every day | ORAL | Status: DC

## 2014-03-16 MED ORDER — LISINOPRIL 5 MG PO TABS: 5.0000 mg | ORAL_TABLET | Freq: Every day | ORAL | Status: DC

## 2014-03-16 MED ORDER — METOPROLOL SUCCINATE ER 25 MG PO TB24: 25.0000 mg | ORAL_TABLET | Freq: Every day | ORAL | Status: DC

## 2014-03-16 NOTE — Progress Notes (Signed)
Pre visit review using our clinic review tool, if applicable. No additional management support is needed unless otherwise documented below in the visit note. 

## 2014-03-19 ENCOUNTER — Encounter: Payer: Self-pay | Admitting: Internal Medicine

## 2014-03-19 NOTE — Assessment & Plan Note (Signed)
Stable. Asymptomatic.

## 2014-03-19 NOTE — Progress Notes (Signed)
Patient ID: Amanda [name], female   DOB: 09-Jan-1934, 81 y.o.   MRN: [id number]   Subjective:    Patient ID: Amanda [name], female    DOB: 09-Jan-1934, 81 y.o.   MRN: [id number]  HPI  The patient is here for annual Medicare wellness examination and management of other chronic and acute problems.   The risk factors are reflected in the social history.  The roster of all physicians providing medical care to patient -  [facility] Dr Marlowe AschoffModlin - dentist Dr Gwen PoundsKowalski - cardiology  Activities of daily living:  The patient is 100% independent in all ADLs: dressing, toileting, feeding as well as independent mobility  Home safety : The patient has smoke detectors in the home. She  wears seatbelts.  There are firearms at home. There is no violence in the home.   There is no risks for hepatitis, STDs or HIV. There is no history of blood transfusion. She has no travel history to infectious disease endemic areas of the world.  The patient is followed regularly by her dentist.  She has seen her eye doctor in the last year.  She reports no hearing difficulty.   She does not  have excessive sun exposure. Discussed the need for sun protection: hats, long sleeves and use of sunscreen if there is significant sun exposure.   Diet: the importance of a healthy diet is discussed. She tries to watch what she eats.    The benefits of regular aerobic exercise were discussed. She tries to stay active.    Depression screen: there are no signs or vegative symptoms of depression- irritability, change in appetite, anhedonia, sadness/tearfullness.  Cognitive assessment: the patient manages some of her financial and personal affairs and is actively engaged. They could not relate day,date,year.  Recalled 1/3 objects at 3 minutes.    The following portions of the patient's history were reviewed and updated as appropriate: allergies, current medications, past family history, past medical history,  past surgical  history, past social history  and problem list.  Visual acuity was not assessed since she has regular follow up with her ophthalmologist. Hearing and body mass index were reviewed.   During the course of the visit the patient was educated and counseled about appropriate screening and preventive services including : fall prevention , diabetes screening, nutrition counseling, colorectal cancer screening, and recommended immunizations.     Past Medical History  Diagnosis Date  . Hypertension   . Hypercholesterolemia   . Hypothyroidism   . Dementia   . Carotid bruit     bilaterally    Outpatient Encounter Prescriptions as of 03/16/2014  Medication Sig  . aspirin 325 MG EC tablet Take 325 mg by mouth daily.  Marland Kitchen. donepezil (ARICEPT) 10 MG tablet Take 1 tablet (10 mg total) by mouth at bedtime.  Marland Kitchen. levothyroxine (SYNTHROID) 75 MCG tablet Take 0.5 tablets (37.5 mcg total) by mouth daily.  Marland Kitchen. lisinopril (PRINIVIL,ZESTRIL) 5 MG tablet Take 1 tablet (5 mg total) by mouth daily.  . metoprolol succinate (TOPROL-XL) 25 MG 24 hr tablet Take 1 tablet (25 mg total) by mouth daily.  . pravastatin (PRAVACHOL) 10 MG tablet Take 1 tablet (10 mg total) by mouth daily.  . [DISCONTINUED] donepezil (ARICEPT) 10 MG tablet Take 1 tablet (10 mg total) by mouth at bedtime.  . [DISCONTINUED] levothyroxine (SYNTHROID) 75 MCG tablet Take 0.5 tablets (37.5 mcg total) by mouth daily.  . [DISCONTINUED] lisinopril (PRINIVIL,ZESTRIL) 5 MG tablet Take 1 tablet (5 mg  total) by mouth daily.  . [DISCONTINUED] metoprolol succinate (TOPROL-XL) 25 MG 24 hr tablet Take 1 tablet (25 mg total) by mouth daily.  . [DISCONTINUED] pravastatin (PRAVACHOL) 10 MG tablet Take 1 tablet (10 mg total) by mouth daily.    Review of Systems  Constitutional: Negative for appetite change and unexpected weight change.  HENT: Negative for congestion and sinus pressure.   Respiratory: Negative for cough, chest tightness and shortness of breath.     Cardiovascular: Negative for chest pain, palpitations and leg swelling.  Gastrointestinal: Negative for nausea, vomiting, abdominal pain and diarrhea.  Genitourinary: Negative for dysuria and difficulty urinating.  Musculoskeletal: Negative for back pain and joint swelling.  Neurological: Negative for dizziness, light-headedness and headaches.  Psychiatric/Behavioral: Negative for behavioral problems and agitation.       Objective:    Physical Exam  Constitutional: She appears well-developed and well-nourished. No distress.  HENT:  Nose: Nose normal.  Mouth/Throat: Oropharynx is clear and moist.  Neck: Neck supple. No thyromegaly present.  Cardiovascular: Normal rate and regular rhythm.   Pulmonary/Chest: Breath sounds normal. No respiratory distress. She has no wheezes.  Abdominal: Soft. Bowel sounds are normal. There is no tenderness.  Musculoskeletal: She exhibits no edema.  Lymphadenopathy:    She has no cervical adenopathy.    BP 137/62 mmHg  Pulse 57  Temp(Src) 97.4 F (36.3 C) (Oral)  Ht  (1.6 m)  Wt 132 lb 4 oz (59.988 kg)  BMI 23.43 kg/m2  SpO2 97% Wt Readings from Last 3 Encounters:  03/16/14 132 lb 4 oz (59.988 kg)  09/21/13 135 lb 8 oz (61.462 kg)  05/17/13 135 lb (61.236 kg)     Lab Results  Component Value Date   WBC 5.6 05/17/2013   HGB 13.1 05/17/2013   HCT 40.3 05/17/2013   PLT 230.0 05/17/2013   GLUCOSE 91 09/28/2013   CHOL 196 09/28/2013   TRIG 110.0 09/28/2013   HDL 46.60 09/28/2013   LDLCALC 127* 09/28/2013   ALT 16 12/13/2013   AST 20 12/13/2013   NA 142 09/28/2013   K 4.5 09/28/2013   CL 105 09/28/2013   CREATININE 0.8 09/28/2013   BUN 11 09/28/2013   CO2 30 09/28/2013   TSH 3.00 09/28/2013   HGBA1C 6.2 09/28/2013   MICROALBUR 1.3 05/17/2013       Assessment & Plan:  This is a routine wellness examination for this patient.  I reviewed all health maintenance protocols including mammography, colonoscopy, bone density.   Needed referrals placed. Age and diagnosis appropriate screening labs were ordered.  Her immunization history was reviewed and appropriate vaccinations were ordered.  Her current medications and allergies were reviewed and needed refills of her chronic medications were ordered. The plan fo ryearly health maintenance was discussed.     MEDICARE ATTESTATION I have personally reviewed:  The patient's medical and social history. The use of alcohol, tobacco and illicit drugs. The current medications and supplements. The patient's function ability including ADLs, fall risks, home safety risks, cognitive, and hearing and visual impairment.   Diet and physical activities. Evaluation for depression and mood disorders.    The patient's weight, height and BMI have been recorded in the chart.  I have made referrals, counseled and provided education to the patient based on review of the above.   Problem List Items Addressed This Visit    CAD (coronary artery disease)    Stable.  Asymptomatic.        Relevant Medications  lisinopril (PRINIVIL,ZESTRIL) tablet   metoprolol succinate (TOPROL-XL) 24 hr tablet   pravastatin (PRAVACHOL) tablet   Hypercholesteremia    Low cholesterol diet and exercise.  Follow lipid panel.        Relevant Medications   lisinopril (PRINIVIL,ZESTRIL) tablet   metoprolol succinate (TOPROL-XL) 24 hr tablet   pravastatin (PRAVACHOL) tablet   Hypertension - Primary    Blood pressure well controlled.        Relevant Medications   lisinopril (PRINIVIL,ZESTRIL) tablet   metoprolol succinate (TOPROL-XL) 24 hr tablet   pravastatin (PRAVACHOL) tablet       Dale [place], MD

## 2014-03-19 NOTE — Assessment & Plan Note (Signed)
Low cholesterol diet and exercise.  Follow lipid panel.   

## 2014-03-19 NOTE — Assessment & Plan Note (Signed)
Blood pressure well controlled

## 2014-03-23 ENCOUNTER — Other Ambulatory Visit (INDEPENDENT_AMBULATORY_CARE_PROVIDER_SITE_OTHER): Payer: Medicare Other

## 2014-03-23 DIAGNOSIS — E7800 Pure hypercholesterolemia, unspecified: Secondary | ICD-10-CM

## 2014-03-23 DIAGNOSIS — R739 Hyperglycemia, unspecified: Secondary | ICD-10-CM | POA: Diagnosis not present

## 2014-03-23 DIAGNOSIS — E039 Hypothyroidism, unspecified: Secondary | ICD-10-CM | POA: Diagnosis not present

## 2014-03-23 DIAGNOSIS — I10 Essential (primary) hypertension: Secondary | ICD-10-CM | POA: Diagnosis not present

## 2014-03-23 DIAGNOSIS — E119 Type 2 diabetes mellitus without complications: Secondary | ICD-10-CM

## 2014-03-23 LAB — HEPATIC FUNCTION PANEL
ALT: 10 U/L (ref 0–35)
AST: 17 U/L (ref 0–37)
Albumin: 3.9 g/dL (ref 3.5–5.2)
Alkaline Phosphatase: 76 U/L (ref 39–117)
BILIRUBIN DIRECT: 0.2 mg/dL (ref 0.0–0.3)
BILIRUBIN TOTAL: 0.7 mg/dL (ref 0.2–1.2)
Total Protein: 6.7 g/dL (ref 6.0–8.3)

## 2014-03-23 LAB — BASIC METABOLIC PANEL
BUN: 19 mg/dL (ref 6–23)
CALCIUM: 9.7 mg/dL (ref 8.4–10.5)
CO2: 32 mEq/L (ref 19–32)
CREATININE: 0.88 mg/dL (ref 0.40–1.20)
Chloride: 104 mEq/L (ref 96–112)
GFR: 65.9 mL/min (ref >60.00)
Glucose, Bld: 102 mg/dL — ABNORMAL HIGH (ref 70–99)
Potassium: 4.8 mEq/L (ref 3.5–5.1)
Sodium: 140 mEq/L (ref 135–145)

## 2014-03-23 LAB — LIPID PANEL
Cholesterol: 186 mg/dL (ref 0–200)
HDL: 46.8 mg/dL (ref >39.00)
LDL Cholesterol: 119 mg/dL — ABNORMAL HIGH (ref 0–99)
NonHDL: 139.2
TRIGLYCERIDES: 99 mg/dL (ref 0.0–149.0)
Total CHOL/HDL Ratio: 4
VLDL: 19.8 mg/dL (ref 0.0–40.0)

## 2014-03-23 LAB — HEMOGLOBIN A1C: HEMOGLOBIN A1C: 6.2 % (ref 4.6–6.5)

## 2014-03-23 LAB — TSH: TSH: 1.89 u[IU]/mL (ref 0.35–4.50)

## 2014-03-24 ENCOUNTER — Other Ambulatory Visit: Payer: Self-pay | Admitting: *Deleted

## 2014-03-24 MED ORDER — PRAVASTATIN SODIUM 10 MG PO TABS: 20.0000 mg | ORAL_TABLET | Freq: Every day | ORAL | Status: DC

## 2014-04-14 ENCOUNTER — Telehealth: Payer: Self-pay | Admitting: *Deleted

## 2014-04-14 DIAGNOSIS — E7800 Pure hypercholesterolemia, unspecified: Secondary | ICD-10-CM

## 2014-04-14 NOTE — Telephone Encounter (Signed)
Order placed for labs.

## 2014-04-14 NOTE — Telephone Encounter (Signed)
Pt coming in Monday what labs and dx? 

## 2014-04-17 ENCOUNTER — Other Ambulatory Visit (INDEPENDENT_AMBULATORY_CARE_PROVIDER_SITE_OTHER): Payer: Medicare Other

## 2014-04-17 DIAGNOSIS — E7800 Pure hypercholesterolemia, unspecified: Secondary | ICD-10-CM

## 2014-04-17 LAB — HEPATIC FUNCTION PANEL
ALK PHOS: 70 U/L (ref 39–117)
ALT: 9 U/L (ref 0–35)
AST: 17 U/L (ref 0–37)
Albumin: 3.9 g/dL (ref 3.5–5.2)
BILIRUBIN DIRECT: 0.1 mg/dL (ref 0.0–0.3)
BILIRUBIN TOTAL: 0.5 mg/dL (ref 0.2–1.2)
Total Protein: 6.4 g/dL (ref 6.0–8.3)

## 2014-04-18 ENCOUNTER — Encounter: Payer: Self-pay | Admitting: *Deleted

## 2014-05-06 IMAGING — CT CT HEAD WITHOUT CONTRAST
1 series · 16 of 30 positions shown, 20 images · non-contrast
Comparison: none

REASON FOR EXAM: weakness
COMMENTS:

[Series 2: without · axial · non-contrast · 0.38mm/px · z∈[-635,-500]mm · 16 of 30 slices shown, 20 images]
[im 2/30  brain]
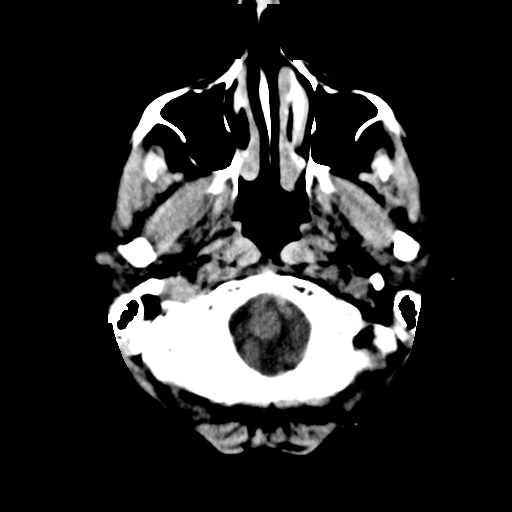
[im 2/30  bone]
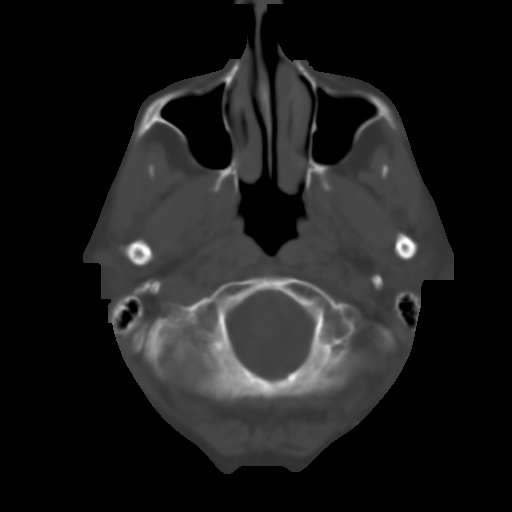
[im 4/30  brain]
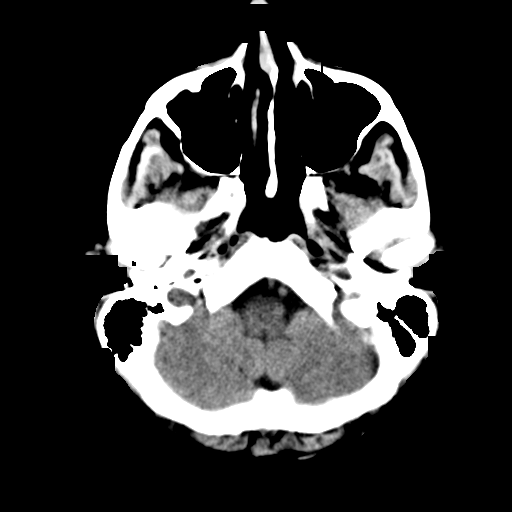
[im 6/30  brain]
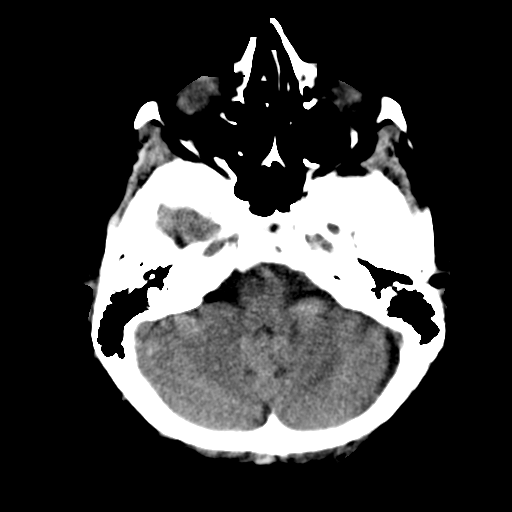
[im 8/30  brain]
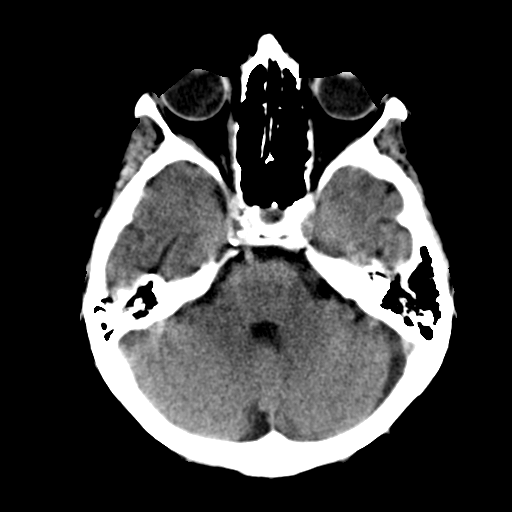
[im 9/30  brain]
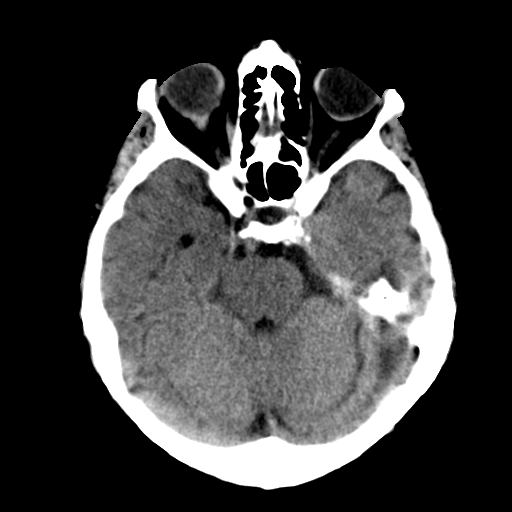
[im 9/30  bone]
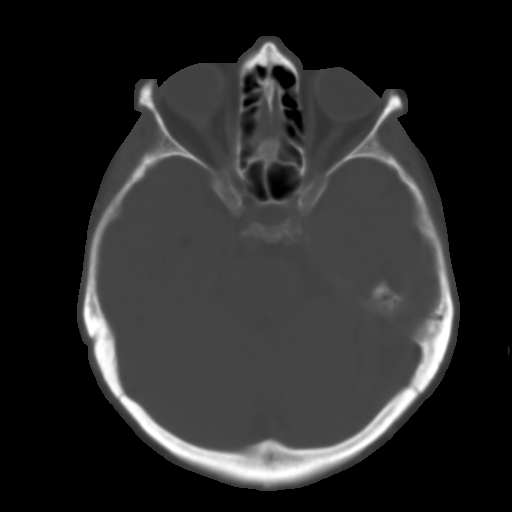
[im 11/30  brain]
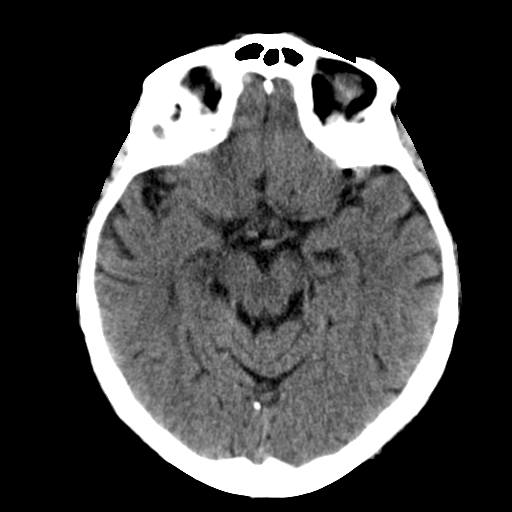
[im 13/30  brain]
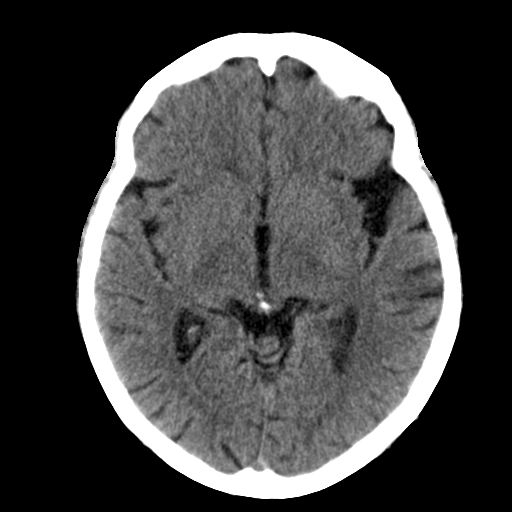
[im 15/30  brain]
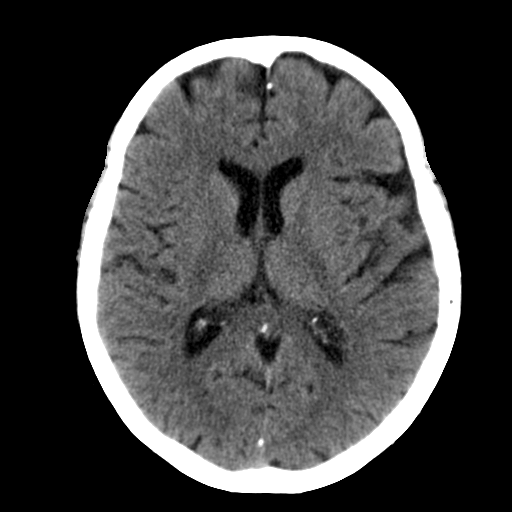
[im 16/30  brain]
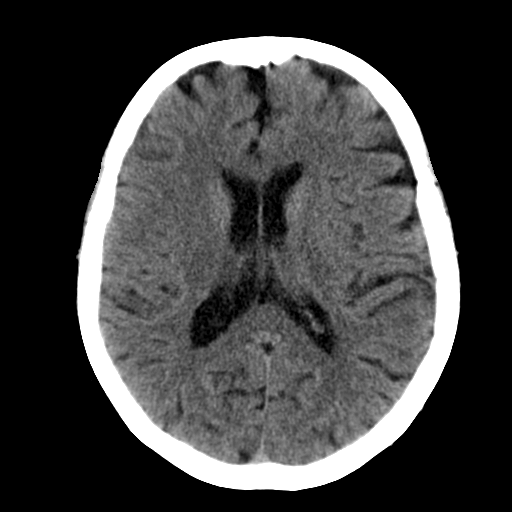
[im 16/30  bone]
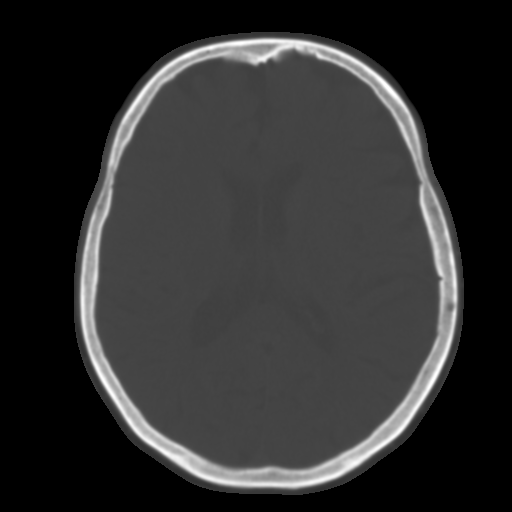
[im 18/30  brain]
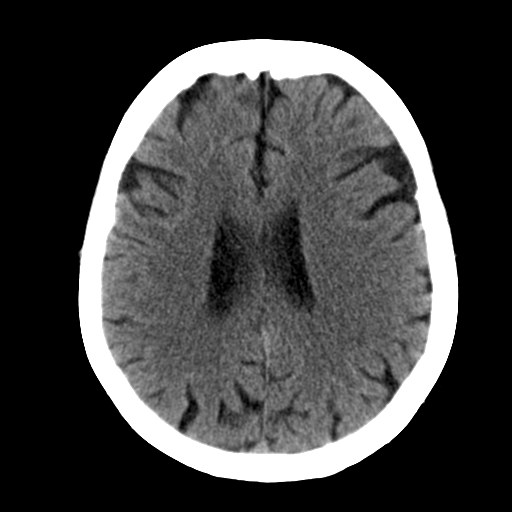
[im 20/30  brain]
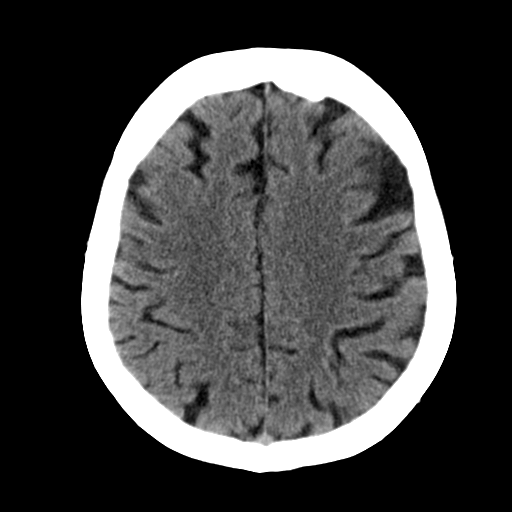
[im 22/30  brain]
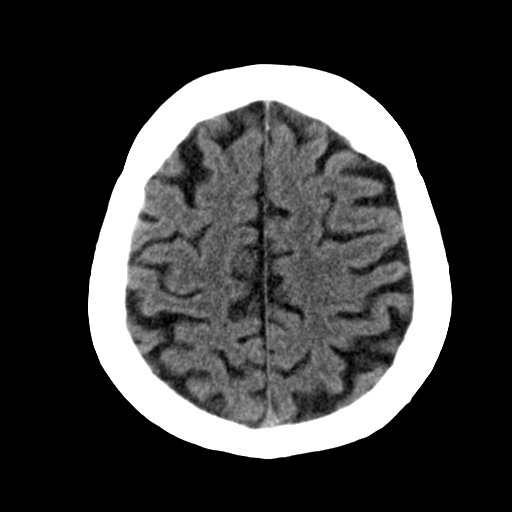
[im 23/30  brain]
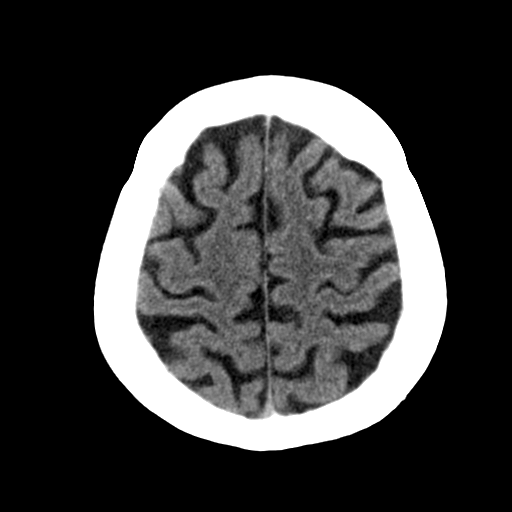
[im 23/30  bone]
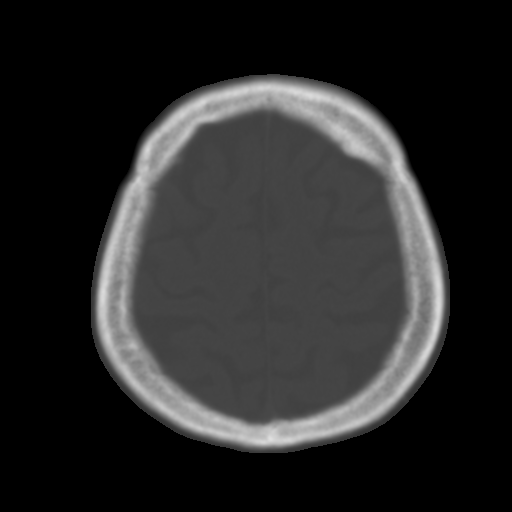
[im 25/30  brain]
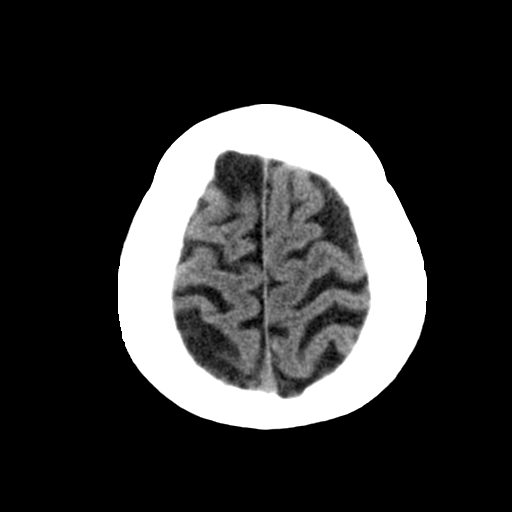
[im 27/30  brain]
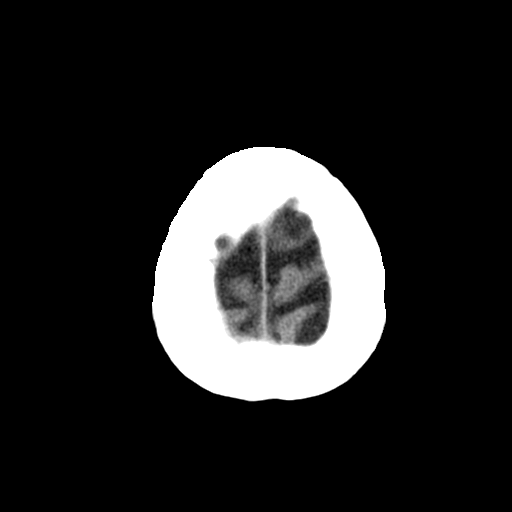
[im 29/30  brain]
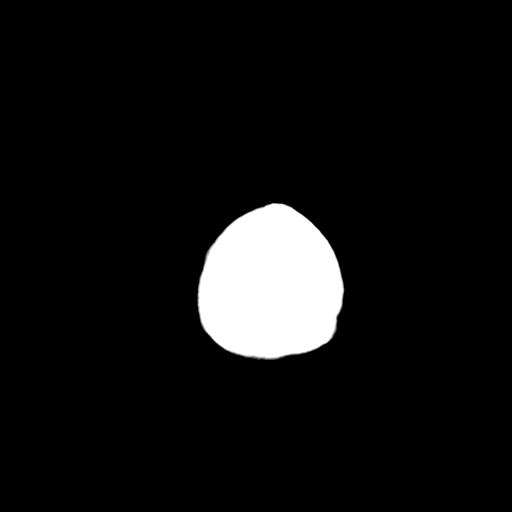

[16 of 30 positions shown; findings below may reference images not displayed]

PROCEDURE:     CT  - CT HEAD WITHOUT CONTRAST  - November 04, 2011 [DATE]

RESULT:     Noncontrast CT of the brain is compared to the study dated 05 June, 2011.

The ventricles and sulci are normal for the patient's age and show
appropriate atrophy. There is no intracranial hemorrhage, mass, mass effect
or territorial infarct evident. There is no significant change. The
ventricles and sulci are otherwise unremarkable. The calvarium is intact.
The sinuses are clear. The mastoid air cells are normal. The orbits are
unremarkable.
IMPRESSION: 1. Age-appropriate atrophy. No acute intracranial abnormality or significant
interval change.

[REDACTED]

## 2014-05-07 IMAGING — US US CAROTID DUPLEX BILAT
1 series · 13 of 24 positions shown · non-contrast
Comparison: none

REASON FOR EXAM: syncope
COMMENTS:

PROCEDURE:     US  - US CAROTID DOPPLER BILATERAL  - November 05, 2011  [DATE]
RESULT:     Comparison: 06/05/2011
TECHNIQUE: Gray-scale, color Doppler, and spectral Doppler images were
obtained of the extracranial carotid artery systems and vertebral arteries
in the neck.

[Series 1: us carotid duplex bilat · 13 of 71 slices shown]
[im 1/71]
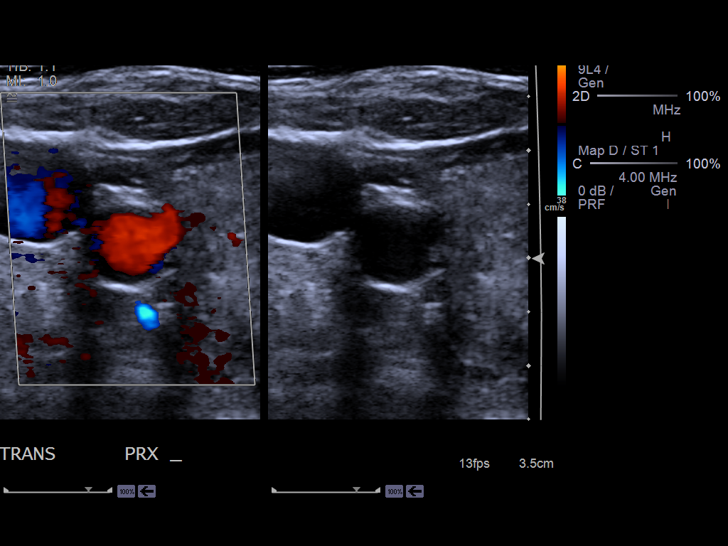
[im 7/71]
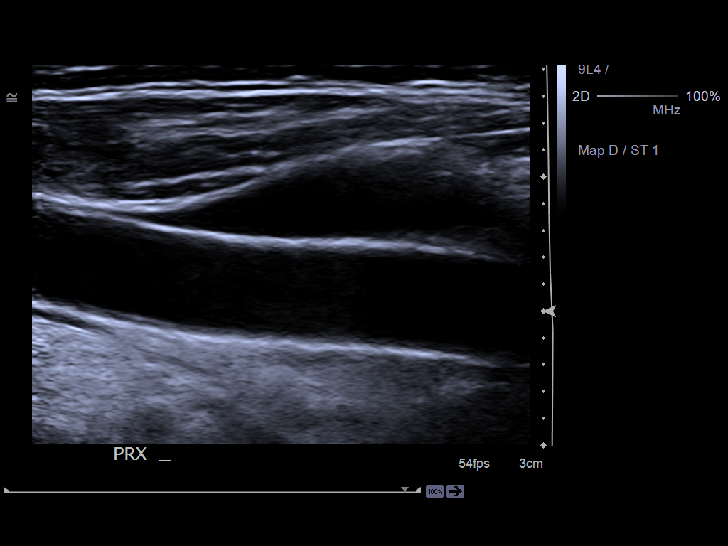
[im 13/71]
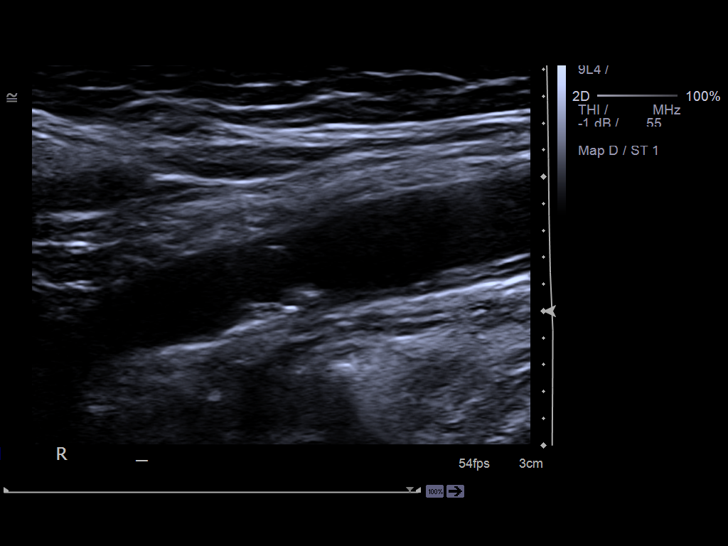
[im 19/71]
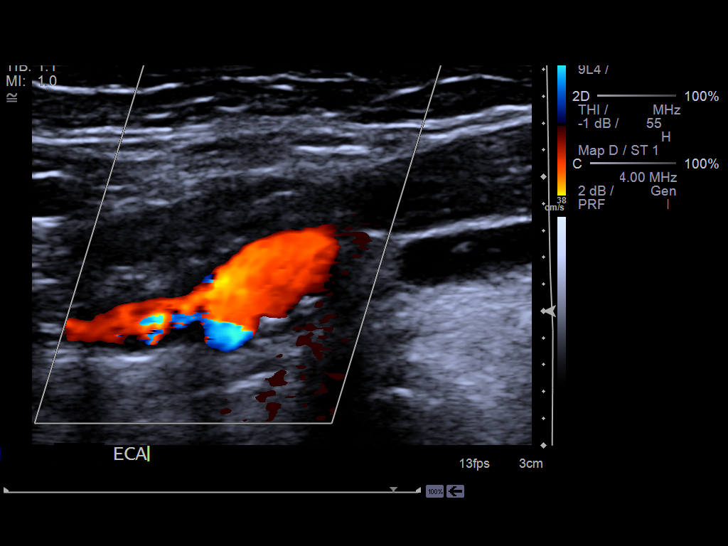
[im 25/71]
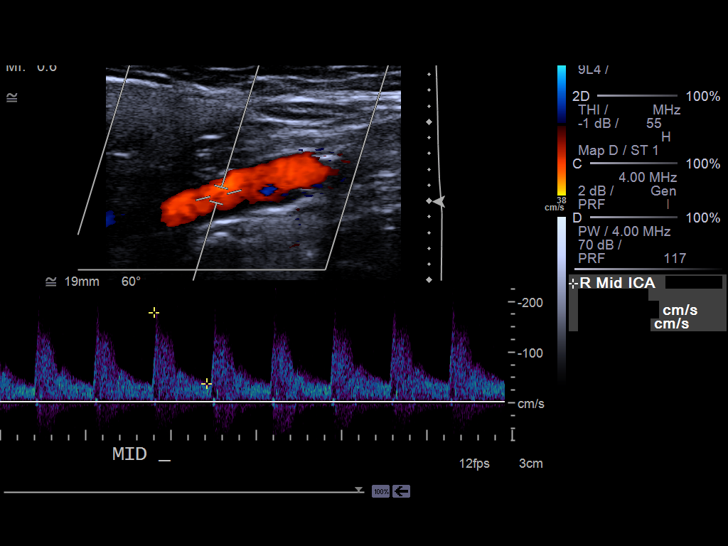
[im 31/71]
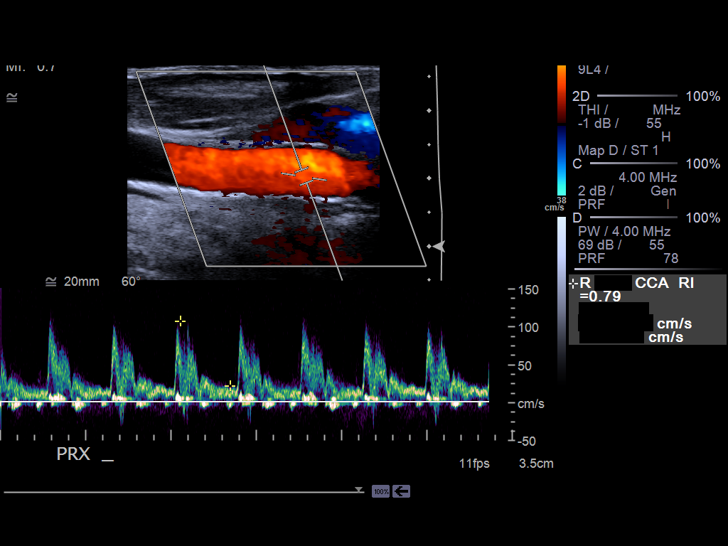
[im 37/71]
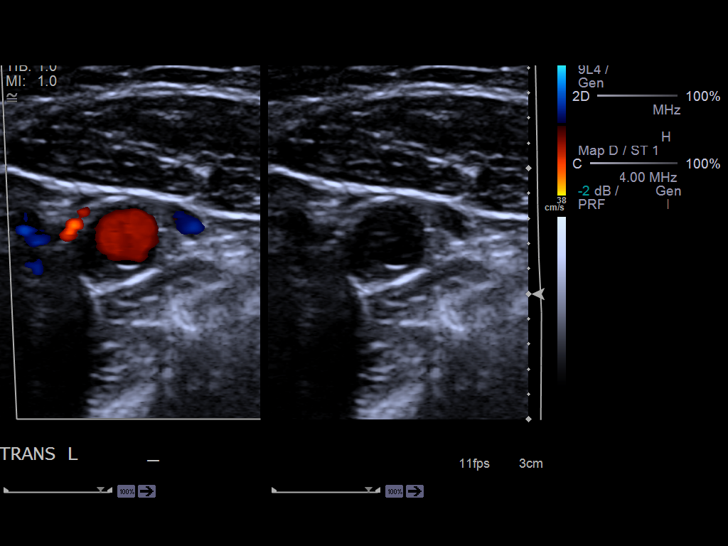
[im 40/71]
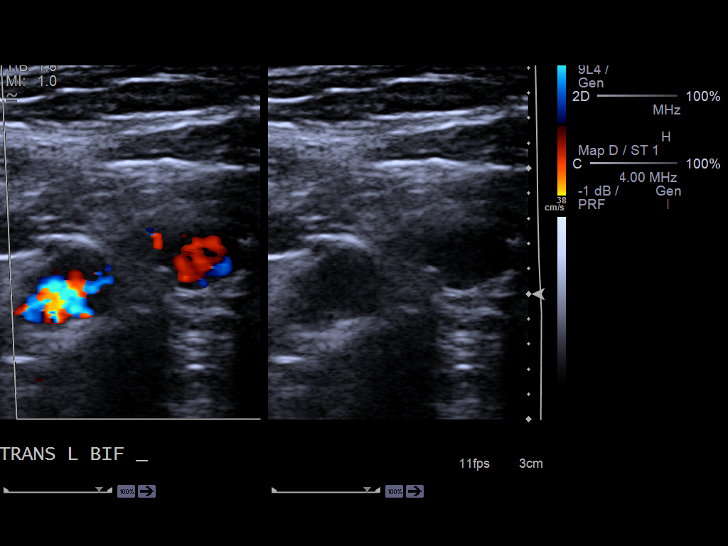
[im 46/71]
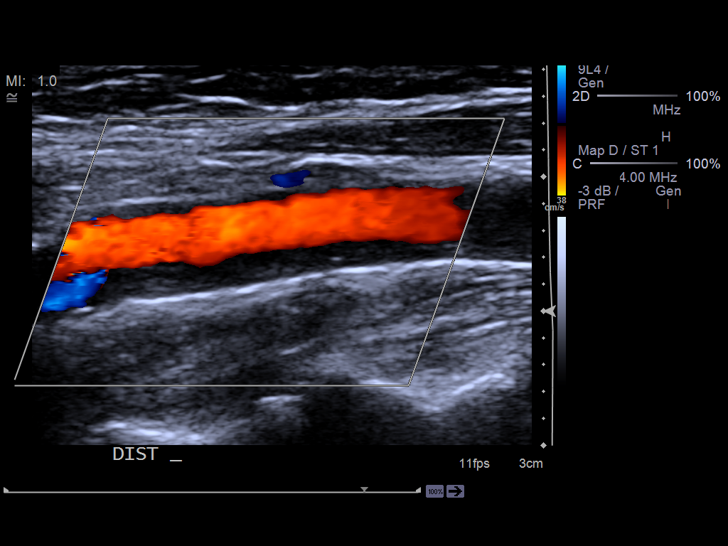
[im 52/71]
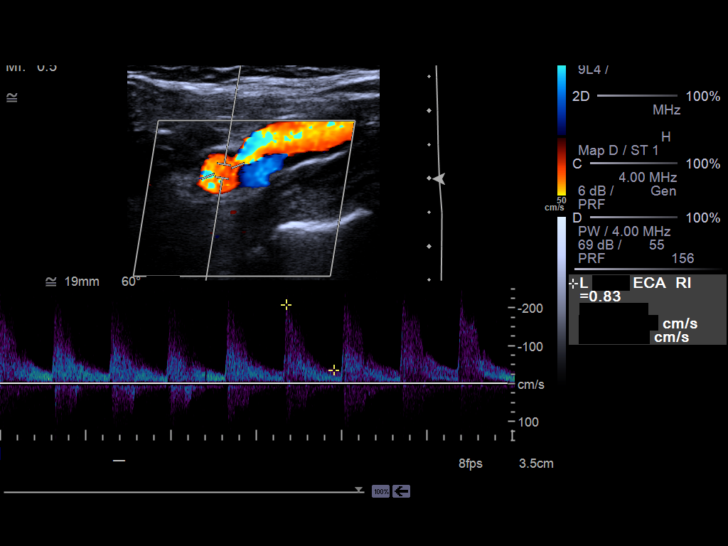
[im 58/71]
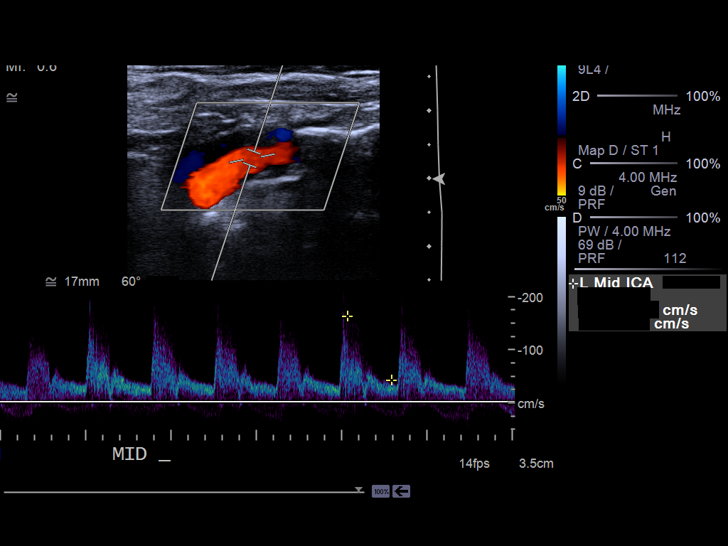
[im 64/71]
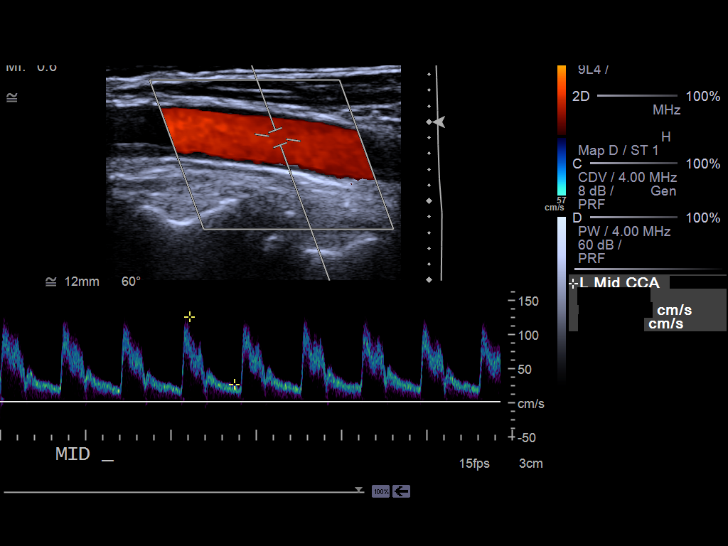
[im 71/71]
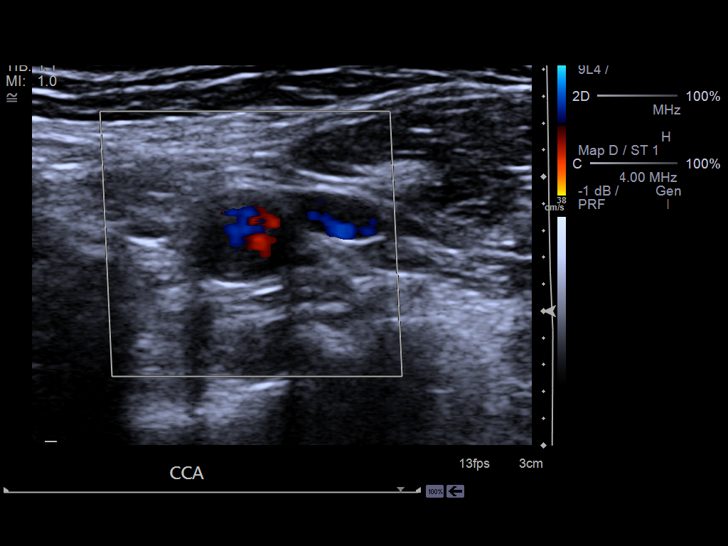

[13 of 24 positions shown; findings below may reference images not displayed]

FINDINGS: Mild atherosclerotic plaque is demonstrated in the right common carotid
artery, carotid bulb, and internal carotid artery. By visual inspection,
there is less than 50% stenosis. However, the peak systolic velocity in the
right mid internal carotid artery is 179 cm/sec, which would correlate with
an estimated stenosis of 50-75%. The right external carotid artery peak
systolic velocity is 304 cm/sec, which suggests a stenosis of this vessel.

Atherosclerotic plaque is demonstrated in the left common carotid artery,
carotid bulb, and left internal carotid artery. Shadowing from the
atherosclerotic plaque limits accurate visual inspection. However, there
appears to be an area of at least 50% stenosis in the internal carotid
artery. The peak systolic velocity in the distal left internal carotid
artery is 232 cm/sec, which would correlate with at least a 50-75% stenosis.

The vertebral arteries are patent bilaterally.
IMPRESSION: 1. Findings concerning for at least a 50-75% stenosis in the left internal
carotid artery. Further evaluation with CTA of the neck is suggested.
2. Peak systolic velocities in the right internal carotid suggest a 50-75%
stenosis. However, this is not appreciated on the grayscale images. This
could be further evaluated with the aforementioned CTA of the neck.

[REDACTED]

## 2014-05-16 NOTE — H&P (Signed)
PATIENT NAME:  Amanda [name], Amanda [name] MR#:  [id number] DATE OF BIRTH:  December 26, 1933  DATE OF ADMISSION:  11/04/2011  PRIMARY CARE PHYSICIAN: Dale [place], MD  REFERRING PHYSICIAN: Daryel November, MD  CHIEF COMPLAINT: Passed out today.  HISTORY OF PRESENT ILLNESS: The patient is a 80 year old Caucasian female with a history of hypertension, hyperlipidemia, and hypothyroidism who presented to the ED with syncope episode today. The patient is alert, awake, and oriented, in no acute distress. According to the patient and the patient's husband, the patient felt very sick this morning.  She looked very pale and passed out for about 3 to 5 minutes. The patient denies any headache, dizziness, nausea, or diaphoresis before and after syncope. The patient denies any dizziness, incontinence, or weakness. No fever, chills, or headache. No chest pain, palpitations, orthopnea, or nocturnal dyspnea. She was brought to the ED by EMS and was noted to have an elevated troponin at 0.1. She was treated with aspirin and Lovenox.   PAST MEDICAL HISTORY:  1. Hypertension. 2. Hyperlipidemia. 3. Hypothyroidism. 4. Dementia.   PAST SURGICAL HISTORY: Cholecystectomy.   FAMILY HISTORY: No obvious heart attack, stroke, hypertension, or diabetes.   REVIEW OF SYSTEMS: CONSTITUTIONAL: The patient denies any fever or chills. No headache or dizziness. No weakness or weight loss. EYES: No double vision or blurred vision.  ENT: No postnasal drip, epistaxis, slurred speech, or dysphagia. CARDIOVASCULAR: No chest pain, palpitations, orthopnea, or nocturnal dyspnea. No edema. PULMONARY: No cough, sputum, shortness of breath, or hematemesis. GASTROINTESTINAL: No abdominal pain, nausea, vomiting, or diarrhea. No melena or bloody stool. GU: No dysuria, hematuria, or incontinence. SKIN: No rash or jaundice. HEMATOLOGY: No easy bruising or bleeding. NEUROLOGY: Positive for syncope and loss of consciousness, but no seizure. No slurred speech or  dysphagia. PSYCH: No depression. No anxiety.   PHYSICAL EXAMINATION:   VITAL SIGNS: Temperature 98.3, blood pressure 188/83, pulse 73, respirations 18, and oxygen saturation 99% on room air.   GENERAL: The patient is alert, awake, and oriented, in no acute distress.   HEENT: Pupils are round, equal, and reactive to light and accommodation. Moist oral mucosa. Clear oropharynx.   NECK: Supple. No JVD or carotid bruits. No lymphadenopathy. No thyromegaly.   CARDIOVASCULAR: S1 and S2 regular rate and rhythm. No murmurs or gallops.  PULMONARY: Bilateral air entry. No wheezing or rales. No use of accessory muscles to breathe.   ABDOMEN: Soft. No distention or tenderness. No organomegaly. Bowel sounds present.   EXTREMITIES: No edema, clubbing, or cyanosis. No calf tenderness. Strong pedal pulses.   SKIN: No rash or jaundice.   NEUROLOGIC: Alert and oriented x3. No focal deficit. Power 5/5. Sensation intact. Deep tendon reflexes 2+.   LABS/RADIOLOGIC STUDIES: CAT scan of the head shows age appropriate atrophy. No acute intracranial abnormality.   Chest x-ray: No acute cardiopulmonary disease.  CBC normal. Glucose 103, BUN 14, and creatinine 0.97. Electrolytes normal.   Urinalysis is negative for urinary tract infection.   Troponin 0.1.   EKG showed normal sinus rhythm at 70 beats per minute.   IMPRESSION:  1. Syncope of unclear etiology. 2. Elevated troponin, rule out NSTEMI.  3. Accelerated hypertension.  4. Hyperlipidemia.  5. Hypothyroidism.  6. Dementia.   PLAN OF TREATMENT: The patient will be admitted to the telemetry floor. We will continue Lovenox 1 mg/kg subcutaneous every 12 hours. Continue aspirin 325 mg p.o. daily and start Zocor 40 mg at bedtime. We will continue lisinopril 10 mg p.o. daily and start Lopressor. We  will follow up troponin level, lipid panel, Hemoglobin A1c, and TSH. We will get cardiology consult. Continue home medication. GI prophylaxis.   I  discussed the patient's situation and plan of treatment with the patient and the patient's husband.   TIME SPENT: About 55 minutes.  ____________________________ Shaune PollackQing Jaequan Propes, MD qc:slb D: 11/04/2011 13:05:08 ET T: 11/04/2011 13:18:38 ET JOB#: [id number]  cc: Shaune PollackQing Meeka Cartelli, MD, <Dictator> Dale Durhamharlene Scott, MD Shaune PollackQING Andy Allende MD ELECTRONICALLY SIGNED 11/04/2011 15:20

## 2014-05-16 NOTE — H&P (Signed)
PATIENT NAME:  Amanda [name], Beadie N MR#:  [id number] DATE OF BIRTH:  01/18/1934  DATE OF ADMISSION:  11/04/2011  ADDENDUM:  ALLERGIES: Sulfa drugs.   HOME MEDICATIONS:  1. Vitamin D3 400 international units p.o. daily.  2. Vitamin B12 1000 mcg p.o. daily.  3. Synthroid 75 mcg 0.5 tab p.o. daily.  4. Lisinopril 10 mg p.o. daily.  5. Donepezil 10 mg p.o. daily.  6. Diflunisal 500 mg 1 to 2 tablets every 12 hours p.r.n. for pain.  7. Amoxicillin 500 mg p.o. 4 times a day.   ____________________________ Shaune PollackQing Miguelangel Korn, MD qc:drc D: 11/04/2011 13:04:53 ET T: 11/04/2011 13:14:37 ET JOB#: [id number]  cc: Shaune PollackQing Kadience Macchi, MD, <Dictator> Shaune PollackQING Eryc Bodey MD ELECTRONICALLY SIGNED 11/04/2011 15:21

## 2014-05-16 NOTE — Consult Note (Signed)
  General Aspect 81-year-old Caucasian female with a history of hypertension, hyperlipidemia, and hypothyroidism who presented to the ED with syncope episode. Cardiology was consulted for syncope.   The patient is a poor historian and her husband has provided the full history . He reports that she has had 4 episodes of near syncope/syncope over the past year. First episode was one year ago with significant workup at that time including evaluation at [facility] . Details are unavailable . Second episode occurred in the morning as she was waking up 6 months ago with an episode that appeared like she rolled her eyes in the back of her head, agonal breathing, holding her chest . Episode several weeks ago with similar, while she woke up in the morning holding her chest, laying back in the bed with labored breathing and loss of consciousness . She had episode yesterday morning the day of admission . He reports she had additional episode this morning while in the hospital on telemetry . The husband called the nurses, they evaluated the telemetry and there was no significant arrhythmia .   Patient denies any symptoms during the daytime. Husband reports episodes have typically been in bed.  According to the husband, yesterday She looked very pale and passed out for about 3 to 5 minutes. No postictal symptoms. The patient denies any dizziness, incontinence, or weakness. No fever, chills, or headache. No chest pain, palpitations, orthopnea, or nocturnal dyspnea.    Present Illness .  PAST MEDICAL HISTORY:  1. Hypertension. 2. Hyperlipidemia. 3. Hypothyroidism. 4. Dementia.   PAST SURGICAL HISTORY: Cholecystectomy.   FAMILY HISTORY: No obvious heart attack, stroke, hypertension, or diabetes.  Social history: No alcohol, no smoking, lives at home with her husband   Physical Exam:   GEN well developed, well nourished, no acute distress    HEENT red conjunctivae    NECK supple  bruit    RESP  normal resp effort    CARD Regular rate and rhythm  Murmur    Murmur Systolic    Systolic Murmur Out flow    ABD denies tenderness  soft    LYMPH negative neck    EXTR negative edema    SKIN normal to palpation    NEURO motor/sensory function intact    PSYCH alert, poor insight   Review of Systems:   Subjective/Chief Complaint syncope, episodes of lightheadedness, pallor, agonal breathing lasting for several minutes occurring early in the morning    General: No Complaints     Skin: No Complaints     ENT: No Complaints     Eyes: No Complaints     Neck: No Complaints     Respiratory: Short of breath     Cardiovascular: No Complaints     Gastrointestinal: No Complaints     Genitourinary: No Complaints     Vascular: No Complaints     Musculoskeletal: No Complaints     Neurologic: No Complaints     Hematologic: No Complaints     Endocrine: No Complaints     Psychiatric: Dementia     Review of Systems: All other systems were reviewed and found to be negative     ROS Pt not able to provide ROS     Medications/Allergies Reviewed Medications/Allergies reviewed      Dementia:    high cholesterol:    thyroid:    Cholecystectomy:        Admit Diagnosis:   ELEVATED TROPONIN: 04-Nov-2011, Active, ELEVATED TROPONIN        Admit Reason:   Elevated troponin: (790.6) Active, ICD9, Other abnormal blood chemistry  Home Medications: Medication Instructions Status  amoxicillin 500 mg oral capsule 1 cap(s) orally 4 times a day until gone  Active  lisinopril 10 mg oral tablet 1 tab(s) orally once a day Active  diflunisal 500 mg oral tablet 1-2 tab(s) orally every 12 hours, As Needed- for Pain  Active  Vitamin D3 400 intl units oral tablet 1 tab(s) orally once a day Active  donepezil 10 mg oral tablet 1 tab(s) orally once a day (at bedtime) Active  Synthroid 75 mcg (0.075 mg) oral tablet 0.5 tab(s) orally once a day first thing in the morning on an empty  stomach Active  Vitamin B12 1000 mcg oral tablet 1 tab(s) orally once a day Active   Lab Results:  Thyroid:  09-Oct-13 02:00    Thyroid Stimulating Hormone 3.11 (0.45-4.50 (International Unit)  ----------------------- Pregnant patients have  different reference  ranges for TSH:  - - - - - - - - - -  Pregnant, first trimetser:  0.36 - 2.50 uIU/mL)  Hepatic:  08-Oct-13 09:34    Bilirubin, Total 0.4   Alkaline Phosphatase 100   SGPT (ALT) 17   SGOT (AST) 24   Total Protein, Serum 7.2   Albumin, Serum  3.0  Routine Chem:  08-Oct-13 09:34    Result Comment TROPONIN - RESULTS VERIFIED BY REPEAT TESTING.  - CALLED TO SABRINA GRIFFIN AT 1113  - BY GA ON 11/04/11  - READ-BACK PROCESS PERFORMED.  Result(s) reported on 04 Nov 2011 at 11:15AM.   Glucose, Serum  103   BUN 14   Creatinine (comp) 0.97   Sodium, Serum 143   Potassium, Serum 4.2   Chloride, Serum 107   CO2, Serum 29   Calcium (Total), Serum 9.1   Osmolality (calc) 286   eGFR (African American) >60   eGFR (Non-African American)  57 (eGFR values <60mL/min/1.73 m2 may be an indication of chronic kidney disease (CKD). Calculated eGFR is useful in patients with stable renal function. The eGFR calculation will not be reliable in acutely ill patients when serum creatinine is changing rapidly. It is not useful in  patients on dialysis. The eGFR calculation may not be applicable to patients at the low and high extremes of body sizes, pregnant women, and vegetarians.)   Anion Gap 7  09-Oct-13 02:00    Result Comment troponin - RESULTS VERIFIED BY REPEAT TESTING.  - prev c/ @ 1113 11/04/11 mpg  Result(s) reported on 05 Nov 2011 at 02:45AM.   Hemoglobin A1c ([facility]) 6.1 (The American Diabetes Association recommends that a primary goal of therapy should be <7% and that physicians should reevaluate the treatment regimen in patients with HbA1c values consistently >8%.)   Cholesterol, Serum 194   Triglycerides, Serum 161   HDL  (INHOUSE)  34   VLDL Cholesterol Calculated 32   LDL Cholesterol Calculated  128 (Result(s) reported on 05 Nov 2011 at 02:42AM.)  Cardiac:  08-Oct-13 09:34    Troponin I  0.10 (0.00-0.05 0.05 ng/mL or less: NEGATIVE  Repeat testing in 3-6 hrs  if clinically indicated. >0.05 ng/mL: POTENTIAL  MYOCARDIAL INJURY. Repeat  testing in 3-6 hrs if  clinically indicated. NOTE: An increase or decrease  of 30% or more on serial  testing suggests a  clinically important change)  09-Oct-13 02:00    Troponin I  0.09 (0.00-0.05 0.05 ng/mL or less: NEGATIVE  Repeat testing in 3-6 hrs  if clinically   indicated. >0.05 ng/mL: POTENTIAL  MYOCARDIAL INJURY. Repeat  testing in 3-6 hrs if  clinically indicated. NOTE: An increase or decrease  of 30% or more on serial  testing suggests a  clinically important change)  Routine UA:  08-Oct-13 09:34    Color (UA) Yellow   Clarity (UA) Hazy   Glucose (UA) Negative   Bilirubin (UA) Negative   Ketones (UA) Negative   Specific Gravity (UA) 1.010   Blood (UA) Negative   pH (UA) 7.0   Protein (UA) Negative   Nitrite (UA) Negative   Leukocyte Esterase (UA) Negative (Result(s) reported on 04 Nov 2011 at 11:23AM.)   RBC (UA) 1 /HPF   WBC (UA) 3 /HPF   Bacteria (UA) NONE SEEN   Epithelial Cells (UA) 8 /HPF   Mucous (UA) PRESENT (Result(s) reported on 04 Nov 2011 at 11:23AM.)  Routine Hem:  08-Oct-13 09:34    WBC (CBC) 8.8   RBC (CBC) 5.16   Hemoglobin (CBC) 14.5   Hematocrit (CBC) 43.9   Platelet Count (CBC) 281 (Result(s) reported on 04 Nov 2011 at 10:04AM.)   MCV 85   MCH 28.1   MCHC 33.0   RDW 14.5   EKG:   Interpretation EKG shows NSR with no st or t wave changes, rate 70    Sulfa drugs: Rash  Vital Signs/Nurse's Notes: **Vital Signs.:   09-Oct-13 07:32   Vital Signs Type Routine   Temperature Temperature (F) 97.6   Celsius 36.4   Temperature Source oral   Pulse Pulse 75   Systolic BP Systolic BP 789   Diastolic BP (mmHg)  Diastolic BP (mmHg) 65   Mean BP 104   Pulse Ox % Pulse Ox % 95   Pulse Ox Activity Level  At rest   Oxygen Delivery Room Air/ 21 %     Impression 81 year old Caucasian female with a history of hypertension, hyperlipidemia, and hypothyroidism who presented to the ED with syncope episode. Cardiology was consulted for syncope.  1) Syncope episodes over the past year x 4, 2 epsiodes in the past several weeks,  3 of 4 episodes happened while in bed, getting up in the AM Echo is normal, no arrhythmia on telemetry She had an epsiode this Am, nothing noted on tele. --etiology uncertain, possible neurologic issue? Seizure? Though no postictal changes. She appears to have dementia, uncertain if related, period of hypoxia? --Could place noturnal oxymetry and monitor --cardiac enz neg, echo normal, less likely ischemia as no exertional symptoms, and episodes typically have been first thing in the morning. Stress test would likely be unrevealing. --Possible sleep apnea? --Need to obtain workup done at [facility] for same problem  2) carotid bruit husband reports pervious carotid u/s need results  uncertain if related  3) dementia Patient is a poor historian. Otherwise, physically relatively well functioning  4) hypertension On low-dose lisinopril   Electronic Signatures: Ida Rogue (MD)  (Signed 09-Oct-13 13:23)  Authored: General Aspect/Present Illness, History and Physical Exam, Review of System, Past Medical History, Health Issues, Home Medications, Labs, EKG , Allergies, Vital Signs/Nurse's Notes, Impression/Plan   Last Updated: 09-Oct-13 13:23 by Ida Rogue (MD)

## 2014-05-16 NOTE — Discharge Summary (Signed)
PATIENT NAME:  Amanda [name], Amanda [name] MR#:  [id number] DATE OF BIRTH:  10-Feb-1933  DATE OF ADMISSION:  11/04/2011 DATE OF DISCHARGE:  11/05/2011  PRESENTING COMPLAINT: Passed out today.  DISCHARGE DIAGNOSES:  1. Syncopal episode, unclear etiology. The patient has had extensive work-up as an outpatient and here in the hospital, negative.  2. Left carotid artery stenosis 50 to 75% stenosis in the left internal carotid.   3. Dementia.  4. Hyperlipidemia.  5. Hypothyroidism.   CONDITION ON DISCHARGE: Fair.   MEDICATIONS:  1. Amoxicillin 500 mg, 1 capsule 4 times daily until completed.  2. Lisinopril 10 mg daily.  3. Diflunisal 500 mg, 1 to 2 tablets every 12 hours as needed.  4. Vitamin D3 400 international units daily.  5. Donepezil10 mg at bedtime.  6. Synthroid 75 mcg p.o. daily.  7. Norvasc 5 mg daily.   FOLLOWUP:  1. Follow up with Dr. Dale Durhamharlene Scott on Thursday, 11/20/2011. 2. Follow up with Dr. Dew/Dr. Gilda CreaseSchnier in 1 to 2 weeks.   BRIEF SUMMARY OF HOSPITAL COURSE: Amanda [name] is a pleasant 81 year old Caucasian female with history of dementia and hyperlipidemia who comes into the Emergency Room, brought in by her husband, with:  1. Syncopal episode. Episode appears to be of unclear etiology.  Per her husband, the patient usually has these episodes in the morning where she is sitting and slumps down. No seizures noted. [facility]. She [facility] artery stenosis and was told to come back for followup, for which the patient has not made an appointment. This was about two years ago. Her ultrasound carotid this time showed 50 to 75% stenosis of the left carotid artery. CT of the head is negative. On telemetry the patient remained in normal sinus rhythm. Cardiac enzymes were negative. The patient was seen by Dr. Mariah MillingGollan. No further cardiac work-up was recommended.  2. Accelerated  hypertension, on lisinopril. The patient did have some fluctuations in blood pressure. We added a small dose of Norvasc.  3. Hypothyroidism. Remained a Synthroid.  4. Dementia.   The patient ambulated well. The discharge plan was discussed with the patient's husband. Further work-up as outpatient, which is followup with [facility], Dr. Wyn Quakerew and Dr. Gilda CreaseSchnier for CT angiography of the neck and  further evaluation of her left carotid artery stenosis.    CODE STATUS: The patient remained a FULL CODE.   TIME SPENT: 40 minutes.    ____________________________ Wylie HailSona A. Allena KatzPatel, MD sap:bjt D: 11/06/2011 13:45:31 ET T: 11/07/2011 11:07:19 ET JOB#: [id number]  cc: Jahlia Omura A. Allena KatzPatel, MD, <Dictator> Annice NeedyJason S. Dew, MD Renford DillsGregory G. Schnier, MD Dale Durhamharlene Scott, MD Willow OraSONA A Braidyn Scorsone MD ELECTRONICALLY SIGNED 11/07/2011 21:00

## 2014-05-19 ENCOUNTER — Encounter: Payer: Self-pay | Admitting: Internal Medicine

## 2014-05-19 ENCOUNTER — Ambulatory Visit (INDEPENDENT_AMBULATORY_CARE_PROVIDER_SITE_OTHER): Payer: Medicare Other | Admitting: Internal Medicine

## 2014-05-19 VITALS — BP 148/66 | HR 50 | Temp 97.7°F | Resp 14 | Ht 62.5 in | Wt 132.4 lb

## 2014-05-19 DIAGNOSIS — E7800 Pure hypercholesterolemia, unspecified: Secondary | ICD-10-CM

## 2014-05-19 DIAGNOSIS — I2510 Atherosclerotic heart disease of native coronary artery without angina pectoris: Secondary | ICD-10-CM | POA: Diagnosis not present

## 2014-05-19 DIAGNOSIS — Z1239 Encounter for other screening for malignant neoplasm of breast: Secondary | ICD-10-CM | POA: Diagnosis not present

## 2014-05-19 DIAGNOSIS — I10 Essential (primary) hypertension: Secondary | ICD-10-CM | POA: Diagnosis not present

## 2014-05-19 DIAGNOSIS — E039 Hypothyroidism, unspecified: Secondary | ICD-10-CM

## 2014-05-19 DIAGNOSIS — F0390 Unspecified dementia without behavioral disturbance: Secondary | ICD-10-CM

## 2014-05-19 DIAGNOSIS — E119 Type 2 diabetes mellitus without complications: Secondary | ICD-10-CM | POA: Diagnosis not present

## 2014-05-19 DIAGNOSIS — Z0000 Encounter for general adult medical examination without abnormal findings: Secondary | ICD-10-CM

## 2014-05-19 NOTE — Progress Notes (Signed)
Pre visit review using our clinic review tool, if applicable. No additional management support is needed unless otherwise documented below in the visit note. 

## 2014-05-19 NOTE — Progress Notes (Addendum)
Patient ID: SHERROL VICARS, female   DOB: Feb 08, 1933, 81 y.o.   MRN: [id number]   Subjective:    Patient ID: Javier Docker, female    DOB: Jul 06, 1933, 81 y.o.   MRN: [id number]  HPI Patient here for her physical exam.  Is doing well.  Feels good.  Staying active.  Denies any chest pain or tightness.  No sob.  Eating and drinking well.  Blood pressure has been doing well on outside checks.  Memory is stable - per husband.      Past Medical History  Diagnosis Date  . Hypertension   . Hypercholesterolemia   . Hypothyroidism   . Dementia   . Carotid bruit     bilaterally    Current Outpatient Prescriptions on File Prior to Visit  Medication Sig Dispense Refill  . aspirin 325 MG EC tablet Take 325 mg by mouth daily.    Marland Kitchen donepezil (ARICEPT) 10 MG tablet Take 1 tablet (10 mg total) by mouth at bedtime. 90 tablet 1  . levothyroxine (SYNTHROID) 75 MCG tablet Take 0.5 tablets (37.5 mcg total) by mouth daily. 90 tablet 1  . lisinopril (PRINIVIL,ZESTRIL) 5 MG tablet Take 1 tablet (5 mg total) by mouth daily. 90 tablet 1  . metoprolol succinate (TOPROL-XL) 25 MG 24 hr tablet Take 1 tablet (25 mg total) by mouth daily. 90 tablet 1  . pravastatin (PRAVACHOL) 10 MG tablet Take 2 tablets (20 mg total) by mouth daily. 180 tablet 1   No current facility-administered medications on file prior to visit.    Review of Systems  Constitutional: Negative for appetite change and unexpected weight change.  HENT: Negative for congestion and sinus pressure.   Respiratory: Negative for cough, chest tightness and shortness of breath.   Cardiovascular: Negative for chest pain, palpitations and leg swelling.  Gastrointestinal: Negative for nausea, vomiting, abdominal pain and diarrhea.  Endocrine: Negative for polyuria.  Neurological: Negative for dizziness, light-headedness and headaches.  Psychiatric/Behavioral: Negative for dysphoric mood and agitation.       Objective:     Blood pressure recheck:   146/68, pulse 56-60  Physical Exam  Constitutional: She appears well-developed and well-nourished. No distress.  HENT:  Nose: Nose normal.  Mouth/Throat: Oropharynx is clear and moist.  Neck: Neck supple. No thyromegaly present.  Cardiovascular: Normal rate and regular rhythm.   Pulmonary/Chest: Breath sounds normal. No respiratory distress. She has no wheezes.  Abdominal: Soft. Bowel sounds are normal. There is no tenderness.  Musculoskeletal: She exhibits no edema or tenderness.  Lymphadenopathy:    She has no cervical adenopathy.  Psychiatric: She has a normal mood and affect. Her behavior is normal.    BP 148/66 mmHg  Pulse 50  Temp(Src) 97.7 F (36.5 C) (Oral)  Resp 14  Ht 5' 2.5" (1.588 m)  Wt 132 lb 6.4 oz (60.056 kg)  BMI 23.82 kg/m2  SpO2 97% Wt Readings from Last 3 Encounters:  05/19/14 132 lb 6.4 oz (60.056 kg)  03/16/14 132 lb 4 oz (59.988 kg)  09/21/13 135 lb 8 oz (61.462 kg)     Lab Results  Component Value Date   WBC 5.6 05/17/2013   HGB 13.1 05/17/2013   HCT 40.3 05/17/2013   PLT 230.0 05/17/2013   GLUCOSE 102* 03/23/2014   CHOL 186 03/23/2014   TRIG 99.0 03/23/2014   HDL 46.80 03/23/2014   LDLCALC 119* 03/23/2014   ALT 9 04/17/2014   AST 17 04/17/2014   NA 140 03/23/2014   K  4.8 03/23/2014   CL 104 03/23/2014   CREATININE 0.88 03/23/2014   BUN 19 03/23/2014   CO2 32 03/23/2014   TSH 1.89 03/23/2014   HGBA1C 6.2 03/23/2014   MICROALBUR 1.3 05/17/2013       Assessment & Plan:   Problem List Items Addressed This Visit    CAD (coronary artery disease)    Seeing cardiology.  Currently asymptomatic.  Continue risk factor modification.       Relevant Orders   CBC with Differential/Platelet   Dementia    Husband reports memory is stable.  On aricept.  Follow.  Sees neurology.       Diabetes    Follow metabolic panel and a1c.  Sugars have been well controlled.  Weight stable.        Relevant Orders   Hemoglobin A1c   Microalbumin /  creatinine urine ratio   Health care maintenance    Mammogram 05/31/13 - Birads I.  Physical 05/19/14.  Colonoscopy 06/11/06 - normal.        Hypercholesteremia    Low cholesterol diet and exercise.  On pravastatin.  Follow lipid panel and liver function tests.        Relevant Orders   Lipid panel   Hepatic function panel   Hypertension    Blood pressure as outlined.  Has been doing well at home.  Same medication regimen.  Follow pressures.  Follow metabolic panel.        Relevant Orders   Basic metabolic panel   Hypothyroidism    On replacement.  Follow tsh.        Other Visit Diagnoses    Breast cancer screening    -  Primary    Relevant Orders    MM Digital Screening        Dale DurhamSCOTT, Valene Villa, MD

## 2014-05-20 ENCOUNTER — Encounter: Payer: Self-pay | Admitting: Internal Medicine

## 2014-05-20 DIAGNOSIS — Z515 Encounter for palliative care: Secondary | ICD-10-CM | POA: Insufficient documentation

## 2014-05-20 DIAGNOSIS — Z0000 Encounter for general adult medical examination without abnormal findings: Secondary | ICD-10-CM | POA: Insufficient documentation

## 2014-05-20 NOTE — Assessment & Plan Note (Signed)
Follow metabolic panel and a1c.  Sugars have been well controlled.  Weight stable.

## 2014-05-20 NOTE — Assessment & Plan Note (Signed)
Mammogram 05/31/13 - Birads I.  Physical 05/19/14.  Colonoscopy 06/11/06 - normal.

## 2014-05-20 NOTE — Assessment & Plan Note (Signed)
Husband reports memory is stable.  On aricept.  Follow.  Sees neurology.

## 2014-05-20 NOTE — Addendum Note (Signed)
Addended by: Charm BargesSCOTT, Jahaad Penado S on: 05/20/2014 06:18 AM   Modules accepted: Kipp BroodSmartSet

## 2014-05-20 NOTE — Assessment & Plan Note (Signed)
Blood pressure as outlined.  Has been doing well at home.  Same medication regimen.  Follow pressures.  Follow metabolic panel.

## 2014-05-20 NOTE — Assessment & Plan Note (Signed)
On replacement.  Follow tsh.  

## 2014-05-20 NOTE — Assessment & Plan Note (Signed)
Seeing cardiology.  Currently asymptomatic.  Continue risk factor modification.

## 2014-05-20 NOTE — Assessment & Plan Note (Signed)
Low cholesterol diet and exercise.  On pravastatin.  Follow lipid panel and liver function tests.   

## 2014-08-21 ENCOUNTER — Other Ambulatory Visit (INDEPENDENT_AMBULATORY_CARE_PROVIDER_SITE_OTHER): Payer: Medicare Other

## 2014-08-21 DIAGNOSIS — I10 Essential (primary) hypertension: Secondary | ICD-10-CM | POA: Diagnosis not present

## 2014-08-21 DIAGNOSIS — E119 Type 2 diabetes mellitus without complications: Secondary | ICD-10-CM

## 2014-08-21 DIAGNOSIS — I2510 Atherosclerotic heart disease of native coronary artery without angina pectoris: Secondary | ICD-10-CM | POA: Diagnosis not present

## 2014-08-21 DIAGNOSIS — E7800 Pure hypercholesterolemia, unspecified: Secondary | ICD-10-CM

## 2014-08-21 LAB — HEPATIC FUNCTION PANEL
ALBUMIN: 3.8 g/dL (ref 3.5–5.2)
ALK PHOS: 66 U/L (ref 39–117)
ALT: 8 U/L (ref 0–35)
AST: 15 U/L (ref 0–37)
BILIRUBIN TOTAL: 0.6 mg/dL (ref 0.2–1.2)
Bilirubin, Direct: 0.1 mg/dL (ref 0.0–0.3)
Total Protein: 6.1 g/dL (ref 6.0–8.3)

## 2014-08-21 LAB — CBC WITH DIFFERENTIAL/PLATELET
BASOS PCT: 0.6 % (ref 0.0–3.0)
Basophils Absolute: 0 10*3/uL (ref 0.0–0.1)
EOS PCT: 1.9 % (ref 0.0–5.0)
Eosinophils Absolute: 0.1 10*3/uL (ref 0.0–0.7)
HCT: 41.8 % (ref 36.0–46.0)
Hemoglobin: 13.9 g/dL (ref 12.0–15.0)
LYMPHS ABS: 1.5 10*3/uL (ref 0.7–4.0)
LYMPHS PCT: 22.3 % (ref 12.0–46.0)
MCHC: 33.3 g/dL (ref 30.0–36.0)
MCV: 88.6 fl (ref 78.0–100.0)
MONO ABS: 0.5 10*3/uL (ref 0.1–1.0)
Monocytes Relative: 7.6 % (ref 3.0–12.0)
NEUTROS ABS: 4.6 10*3/uL (ref 1.4–7.7)
Neutrophils Relative %: 67.6 % (ref 43.0–77.0)
Platelets: 203 10*3/uL (ref 150.0–400.0)
RBC: 4.72 Mil/uL (ref 3.87–5.11)
RDW: 14.1 % (ref 11.5–15.5)
WBC: 6.8 10*3/uL (ref 4.0–10.5)

## 2014-08-21 LAB — BASIC METABOLIC PANEL
BUN: 20 mg/dL (ref 6–23)
CALCIUM: 9.6 mg/dL (ref 8.4–10.5)
CHLORIDE: 105 meq/L (ref 96–112)
CO2: 30 mEq/L (ref 19–32)
CREATININE: 0.88 mg/dL (ref 0.40–1.20)
GFR: 65.83 mL/min (ref >60.00)
GLUCOSE: 83 mg/dL (ref 70–99)
Potassium: 4.4 mEq/L (ref 3.5–5.1)
Sodium: 143 mEq/L (ref 135–145)

## 2014-08-21 LAB — MICROALBUMIN / CREATININE URINE RATIO
Creatinine,U: 221.2 mg/dL
Microalb Creat Ratio: 0.8 mg/g (ref 0.0–30.0)
Microalb, Ur: 1.8 mg/dL (ref 0.0–1.9)

## 2014-08-21 LAB — LIPID PANEL
CHOLESTEROL: 156 mg/dL (ref 0–200)
HDL: 46.8 mg/dL (ref >39.00)
LDL Cholesterol: 93 mg/dL (ref 0–99)
NONHDL: 109.2
Total CHOL/HDL Ratio: 3
Triglycerides: 79 mg/dL (ref 0.0–149.0)
VLDL: 15.8 mg/dL (ref 0.0–40.0)

## 2014-08-21 LAB — HEMOGLOBIN A1C: Hgb A1c MFr Bld: 5.9 % (ref 4.6–6.5)

## 2014-08-23 ENCOUNTER — Encounter: Payer: Self-pay | Admitting: Internal Medicine

## 2014-08-23 ENCOUNTER — Ambulatory Visit (INDEPENDENT_AMBULATORY_CARE_PROVIDER_SITE_OTHER): Payer: Medicare Other | Admitting: Internal Medicine

## 2014-08-23 VITALS — BP 120/80 | HR 52 | Temp 97.9°F | Ht 62.5 in | Wt 135.1 lb

## 2014-08-23 DIAGNOSIS — E119 Type 2 diabetes mellitus without complications: Secondary | ICD-10-CM | POA: Diagnosis not present

## 2014-08-23 DIAGNOSIS — Z0000 Encounter for general adult medical examination without abnormal findings: Secondary | ICD-10-CM

## 2014-08-23 DIAGNOSIS — E039 Hypothyroidism, unspecified: Secondary | ICD-10-CM

## 2014-08-23 DIAGNOSIS — I2510 Atherosclerotic heart disease of native coronary artery without angina pectoris: Secondary | ICD-10-CM

## 2014-08-23 DIAGNOSIS — I10 Essential (primary) hypertension: Secondary | ICD-10-CM

## 2014-08-23 DIAGNOSIS — E7800 Pure hypercholesterolemia, unspecified: Secondary | ICD-10-CM

## 2014-08-23 DIAGNOSIS — F0390 Unspecified dementia without behavioral disturbance: Secondary | ICD-10-CM

## 2014-08-23 DIAGNOSIS — R42 Dizziness and giddiness: Secondary | ICD-10-CM

## 2014-08-23 NOTE — Progress Notes (Signed)
Pre visit review using our clinic review tool, if applicable. No additional management support is needed unless otherwise documented below in the visit note. 

## 2014-08-23 NOTE — Progress Notes (Signed)
Patient ID: Amanda [name], female   DOB: 12/11/1933, 81 y.o.   MRN: [id number]   Subjective:    Patient ID: Amanda [name], female    DOB: 11-Nov-1933, 81 y.o.   MRN: [id number]  HPI  Patient here for a scheduled follow up.  She is accompanied by her husband.  History obtained from both of them.  About three weeks ago, she had an episode of room spinning/dizziness.  Was in church - sitting.  Got up to leave and noticed room spinning.  Continued.  EMS called.  Evaluated.  Discussed ER evaluation.  Declined because was feeling better.  No headache.  Was fatigued.  Lasted approximately two days.  Resolved.  Has not reoccurred.  Does not have a history of inner ear.  No cardiac symptoms with increased activity or exertion.  No sob. Eating and drinking well.  No acid reflux.  No nausea or vomiting.  Bowels stable.     Past Medical History  Diagnosis Date  . Hypertension   . Hypercholesterolemia   . Hypothyroidism   . Dementia   . Carotid bruit     bilaterally    Outpatient Encounter Prescriptions as of 08/23/2014  Medication Sig  . aspirin 325 MG EC tablet Take 325 mg by mouth daily.  Marland Kitchen donepezil (ARICEPT) 10 MG tablet Take 1 tablet (10 mg total) by mouth at bedtime.  Marland Kitchen levothyroxine (SYNTHROID) 75 MCG tablet Take 0.5 tablets (37.5 mcg total) by mouth daily.  Marland Kitchen lisinopril (PRINIVIL,ZESTRIL) 5 MG tablet Take 1 tablet (5 mg total) by mouth daily.  . metoprolol succinate (TOPROL-XL) 25 MG 24 hr tablet Take 1 tablet (25 mg total) by mouth daily.  . pravastatin (PRAVACHOL) 10 MG tablet Take 2 tablets (20 mg total) by mouth daily. (Patient taking differently: Take 10 mg by mouth daily. )   No facility-administered encounter medications on file as of 08/23/2014.    Review of Systems  Constitutional: Negative for appetite change and unexpected weight change.  HENT: Negative for congestion and sinus pressure.   Respiratory: Negative for cough, chest tightness and shortness of breath.     Cardiovascular: Negative for chest pain, palpitations and leg swelling.  Gastrointestinal: Negative for nausea, vomiting, abdominal pain and diarrhea.  Genitourinary: Negative for dysuria and difficulty urinating.  Skin: Negative for color change and rash.  Neurological: Positive for dizziness (previous episode of dizziness/room spinning.  has resolved now. ). Negative for headaches.  Psychiatric/Behavioral: Negative for dysphoric mood and agitation.       Objective:    Physical Exam  Constitutional: She appears well-developed and well-nourished. No distress.  HENT:  Nose: Nose normal.  Mouth/Throat: Oropharynx is clear and moist.  Neck: Neck supple. No thyromegaly present.  Cardiovascular: Normal rate and regular rhythm.   Pulmonary/Chest: Breath sounds normal. No respiratory distress. She has no wheezes.  Abdominal: Soft. Bowel sounds are normal. There is no tenderness.  Musculoskeletal: She exhibits no edema or tenderness.  Lymphadenopathy:    She has no cervical adenopathy.  Skin: No rash noted. No erythema.  Psychiatric: She has a normal mood and affect. Her behavior is normal.    BP 120/80 mmHg  Pulse 52  Temp(Src) 97.9 F (36.6 C) (Oral)  Ht 5' 2.5" (1.588 m)  Wt 135 lb 2 oz (61.292 kg)  BMI 24.31 kg/m2  SpO2 98% Wt Readings from Last 3 Encounters:  08/23/14 135 lb 2 oz (61.292 kg)  05/19/14 132 lb 6.4 oz (60.056 kg)  03/16/14 132 lb  4 oz (59.988 kg)     Lab Results  Component Value Date   WBC 6.8 08/21/2014   HGB 13.9 08/21/2014   HCT 41.8 08/21/2014   PLT 203.0 08/21/2014   GLUCOSE 83 08/21/2014   CHOL 156 08/21/2014   TRIG 79.0 08/21/2014   HDL 46.80 08/21/2014   LDLCALC 93 08/21/2014   ALT 8 08/21/2014   AST 15 08/21/2014   NA 143 08/21/2014   K 4.4 08/21/2014   CL 105 08/21/2014   CREATININE 0.88 08/21/2014   BUN 20 08/21/2014   CO2 30 08/21/2014   TSH 1.89 03/23/2014   HGBA1C 5.9 08/21/2014   MICROALBUR 1.8 08/21/2014       Assessment  & Plan:   Problem List Items Addressed This Visit    CAD (coronary artery disease) - Primary    Seeing cardiology.  Continue risk factor modification.  Currently doing well.        Dementia    On aricept.  Sees neurology.        Diabetes    Follow sugars.  Low carb diet and exercise.  Follow met b and a1c.   Lab Results  Component Value Date   HGBA1C 5.9 08/21/2014        Relevant Orders   Hemoglobin A1c   Dizziness    Had the episode a few weeks ago as outlined.  Resolved.  No reoccurrence.  Discussed further w/up.  They want to monitor.  Follow.       Health care maintenance    Physical 05/19/14.  Colonoscopy 06/11/06.  Mammogram ordered.  Need to schedule.        Hypercholesteremia    On pravastatin.  Low cholesterol diet and exercise.  Follow lipid panel and liver function tests.        Relevant Orders   Hepatic function panel   Lipid panel   Hypertension    Blood pressure has been under good control.  Continue same medication regimen.  Follow pressures.  Follow metabolic panel.        Relevant Orders   Basic metabolic panel   Hypothyroidism    On thyroid replacement.  Follow tsh.         I spent 25 minutes with the patient and more than 50% of the time was spent in consultation regarding the above.     Einar Pheasant, MD

## 2014-08-27 ENCOUNTER — Encounter: Payer: Self-pay | Admitting: Internal Medicine

## 2014-08-27 DIAGNOSIS — R42 Dizziness and giddiness: Secondary | ICD-10-CM | POA: Insufficient documentation

## 2014-08-27 NOTE — Assessment & Plan Note (Signed)
Physical 05/19/14.  Colonoscopy 06/11/06.  Mammogram ordered.  Need to schedule.

## 2014-08-27 NOTE — Assessment & Plan Note (Signed)
On aricept.  Sees neurology.   

## 2014-08-27 NOTE — Assessment & Plan Note (Signed)
On thyroid replacement.  Follow tsh.  

## 2014-08-27 NOTE — Assessment & Plan Note (Signed)
Seeing cardiology.  Continue risk factor modification.  Currently doing well.

## 2014-08-27 NOTE — Assessment & Plan Note (Signed)
Had the episode a few weeks ago as outlined.  Resolved.  No reoccurrence.  Discussed further w/up.  They want to monitor.  Follow.

## 2014-08-27 NOTE — Assessment & Plan Note (Signed)
On pravastatin.  Low cholesterol diet and exercise.  Follow lipid panel and liver function tests.   

## 2014-08-27 NOTE — Assessment & Plan Note (Signed)
Follow sugars.  Low carb diet and exercise.  Follow met b and a1c.   Lab Results  Component Value Date   HGBA1C 5.9 08/21/2014

## 2014-08-27 NOTE — Assessment & Plan Note (Signed)
Blood pressure has been under good control.  Continue same medication regimen.  Follow pressures.  Follow metabolic panel.   

## 2014-08-31 ENCOUNTER — Ambulatory Visit
Admission: RE | Admit: 2014-08-31 | Discharge: 2014-08-31 | Disposition: A | Discharge status: Home / Self Care | Payer: Medicare Other | Source: Ambulatory Visit | Attending: Internal Medicine | Admitting: Internal Medicine

## 2014-08-31 DIAGNOSIS — Z1231 Encounter for screening mammogram for malignant neoplasm of breast: Secondary | ICD-10-CM | POA: Insufficient documentation

## 2014-08-31 DIAGNOSIS — R922 Inconclusive mammogram: Secondary | ICD-10-CM | POA: Diagnosis not present

## 2014-08-31 DIAGNOSIS — Z1239 Encounter for other screening for malignant neoplasm of breast: Secondary | ICD-10-CM

## 2014-09-01 ENCOUNTER — Other Ambulatory Visit: Payer: Self-pay | Admitting: Internal Medicine

## 2014-09-01 DIAGNOSIS — R928 Other abnormal and inconclusive findings on diagnostic imaging of breast: Secondary | ICD-10-CM

## 2014-09-01 NOTE — Progress Notes (Signed)
Order placed for f/u left breast mammogram and ultrasound.   

## 2014-09-04 ENCOUNTER — Other Ambulatory Visit: Payer: Self-pay | Admitting: Internal Medicine

## 2014-09-05 ENCOUNTER — Encounter: Payer: Self-pay | Admitting: *Deleted

## 2014-09-05 ENCOUNTER — Other Ambulatory Visit (INDEPENDENT_AMBULATORY_CARE_PROVIDER_SITE_OTHER): Payer: Medicare Other

## 2014-09-05 DIAGNOSIS — E119 Type 2 diabetes mellitus without complications: Secondary | ICD-10-CM | POA: Diagnosis not present

## 2014-09-05 DIAGNOSIS — I10 Essential (primary) hypertension: Secondary | ICD-10-CM | POA: Diagnosis not present

## 2014-09-05 DIAGNOSIS — E7800 Pure hypercholesterolemia, unspecified: Secondary | ICD-10-CM

## 2014-09-05 LAB — HEPATIC FUNCTION PANEL
ALBUMIN: 4 g/dL (ref 3.5–5.2)
ALT: 9 U/L (ref 0–35)
AST: 16 U/L (ref 0–37)
Alkaline Phosphatase: 66 U/L (ref 39–117)
Bilirubin, Direct: 0.2 mg/dL (ref 0.0–0.3)
TOTAL PROTEIN: 6.8 g/dL (ref 6.0–8.3)
Total Bilirubin: 0.5 mg/dL (ref 0.2–1.2)

## 2014-09-05 LAB — HEMOGLOBIN A1C: Hgb A1c MFr Bld: 6.1 % (ref 4.6–6.5)

## 2014-09-05 LAB — BASIC METABOLIC PANEL
BUN: 15 mg/dL (ref 6–23)
CHLORIDE: 104 meq/L (ref 96–112)
CO2: 31 mEq/L (ref 19–32)
Calcium: 9.9 mg/dL (ref 8.4–10.5)
Creatinine, Ser: 0.82 mg/dL (ref 0.40–1.20)
GFR: 71.42 mL/min (ref >60.00)
GLUCOSE: 101 mg/dL — AB (ref 70–99)
Potassium: 4.6 mEq/L (ref 3.5–5.1)
Sodium: 141 mEq/L (ref 135–145)

## 2014-09-05 LAB — LIPID PANEL
CHOL/HDL RATIO: 3
Cholesterol: 155 mg/dL (ref 0–200)
HDL: 47.3 mg/dL (ref >39.00)
LDL Cholesterol: 81 mg/dL (ref 0–99)
NonHDL: 107.94
Triglycerides: 134 mg/dL (ref 0.0–149.0)
VLDL: 26.8 mg/dL (ref 0.0–40.0)

## 2014-09-06 ENCOUNTER — Ambulatory Visit
Admission: RE | Admit: 2014-09-06 | Discharge: 2014-09-06 | Disposition: A | Discharge status: Home / Self Care | Payer: Medicare Other | Source: Ambulatory Visit | Attending: Internal Medicine | Admitting: Internal Medicine

## 2014-09-06 ENCOUNTER — Ambulatory Visit: Payer: Medicare Other

## 2014-09-06 ENCOUNTER — Other Ambulatory Visit: Payer: Self-pay | Admitting: Internal Medicine

## 2014-09-06 DIAGNOSIS — R928 Other abnormal and inconclusive findings on diagnostic imaging of breast: Secondary | ICD-10-CM | POA: Diagnosis present

## 2014-09-14 ENCOUNTER — Other Ambulatory Visit: Payer: Self-pay | Admitting: Internal Medicine

## 2014-09-26 ENCOUNTER — Ambulatory Visit (INDEPENDENT_AMBULATORY_CARE_PROVIDER_SITE_OTHER): Payer: Medicare Other | Admitting: *Deleted

## 2014-09-26 DIAGNOSIS — Z23 Encounter for immunization: Secondary | ICD-10-CM

## 2014-11-08 ENCOUNTER — Other Ambulatory Visit: Payer: Self-pay | Admitting: *Deleted

## 2014-11-08 MED ORDER — LISINOPRIL 5 MG PO TABS: 5.0000 mg | ORAL_TABLET | Freq: Every day | ORAL | Status: DC

## 2014-11-08 MED ORDER — PRAVASTATIN SODIUM 10 MG PO TABS: 10.0000 mg | ORAL_TABLET | Freq: Every day | ORAL | Status: DC

## 2014-12-26 ENCOUNTER — Other Ambulatory Visit (INDEPENDENT_AMBULATORY_CARE_PROVIDER_SITE_OTHER): Payer: Medicare Other

## 2014-12-26 ENCOUNTER — Telehealth: Payer: Self-pay | Admitting: *Deleted

## 2014-12-26 DIAGNOSIS — E119 Type 2 diabetes mellitus without complications: Secondary | ICD-10-CM

## 2014-12-26 DIAGNOSIS — I10 Essential (primary) hypertension: Secondary | ICD-10-CM | POA: Diagnosis not present

## 2014-12-26 DIAGNOSIS — E7800 Pure hypercholesterolemia, unspecified: Secondary | ICD-10-CM

## 2014-12-26 DIAGNOSIS — E039 Hypothyroidism, unspecified: Secondary | ICD-10-CM

## 2014-12-26 LAB — HEPATIC FUNCTION PANEL
ALT: 9 U/L (ref 0–35)
AST: 15 U/L (ref 0–37)
Albumin: 3.7 g/dL (ref 3.5–5.2)
Alkaline Phosphatase: 69 U/L (ref 39–117)
BILIRUBIN TOTAL: 0.6 mg/dL (ref 0.2–1.2)
Bilirubin, Direct: 0.1 mg/dL (ref 0.0–0.3)
Total Protein: 6.2 g/dL (ref 6.0–8.3)

## 2014-12-26 LAB — LIPID PANEL
Cholesterol: 183 mg/dL (ref 0–200)
HDL: 51.4 mg/dL (ref >39.00)
LDL CALC: 111 mg/dL — AB (ref 0–99)
NonHDL: 132.05
Total CHOL/HDL Ratio: 4
Triglycerides: 106 mg/dL (ref 0.0–149.0)
VLDL: 21.2 mg/dL (ref 0.0–40.0)

## 2014-12-26 LAB — BASIC METABOLIC PANEL
BUN: 18 mg/dL (ref 6–23)
CHLORIDE: 104 meq/L (ref 96–112)
CO2: 33 mEq/L — ABNORMAL HIGH (ref 19–32)
Calcium: 9.4 mg/dL (ref 8.4–10.5)
Creatinine, Ser: 0.83 mg/dL (ref 0.40–1.20)
GFR: 70.37 mL/min (ref >60.00)
Glucose, Bld: 82 mg/dL (ref 70–99)
Potassium: 4.9 mEq/L (ref 3.5–5.1)
SODIUM: 143 meq/L (ref 135–145)

## 2014-12-26 LAB — HEMOGLOBIN A1C: Hgb A1c MFr Bld: 6.1 % (ref 4.6–6.5)

## 2014-12-26 NOTE — Telephone Encounter (Signed)
Labs and dx?  

## 2014-12-26 NOTE — Telephone Encounter (Signed)
Orders placed for labs

## 2014-12-27 ENCOUNTER — Ambulatory Visit (INDEPENDENT_AMBULATORY_CARE_PROVIDER_SITE_OTHER): Payer: Medicare Other | Admitting: Internal Medicine

## 2014-12-27 ENCOUNTER — Encounter: Payer: Self-pay | Admitting: Internal Medicine

## 2014-12-27 VITALS — BP 130/70 | HR 80 | Temp 98.2°F | Resp 18 | Ht 63.0 in | Wt 140.0 lb

## 2014-12-27 DIAGNOSIS — I10 Essential (primary) hypertension: Secondary | ICD-10-CM

## 2014-12-27 DIAGNOSIS — Z0000 Encounter for general adult medical examination without abnormal findings: Secondary | ICD-10-CM | POA: Diagnosis not present

## 2014-12-27 DIAGNOSIS — W19XXXA Unspecified fall, initial encounter: Secondary | ICD-10-CM

## 2014-12-27 DIAGNOSIS — E039 Hypothyroidism, unspecified: Secondary | ICD-10-CM

## 2014-12-27 DIAGNOSIS — E7800 Pure hypercholesterolemia, unspecified: Secondary | ICD-10-CM

## 2014-12-27 DIAGNOSIS — I2510 Atherosclerotic heart disease of native coronary artery without angina pectoris: Secondary | ICD-10-CM

## 2014-12-27 DIAGNOSIS — E119 Type 2 diabetes mellitus without complications: Secondary | ICD-10-CM

## 2014-12-27 DIAGNOSIS — F0390 Unspecified dementia without behavioral disturbance: Secondary | ICD-10-CM

## 2014-12-27 MED ORDER — PRAVASTATIN SODIUM 20 MG PO TABS
20.0000 mg | ORAL_TABLET | Freq: Every day | ORAL | Status: DC
Start: 2014-12-27 — End: 2015-10-22

## 2014-12-27 MED ORDER — MUPIROCIN 2 % EX OINT: 1.0000 "application " | TOPICAL_OINTMENT | Freq: Two times a day (BID) | CUTANEOUS | Status: DC

## 2014-12-27 NOTE — Patient Instructions (Signed)
Change pravastatin to 20mg  per day

## 2014-12-27 NOTE — Progress Notes (Signed)
Pre-visit discussion using our clinic review tool. No additional management support is needed unless otherwise documented below in the visit note.  

## 2014-12-27 NOTE — Progress Notes (Signed)
Patient ID: Amanda [name], female   DOB: [birth year], 81 y.o.   MRN: [id number]   Subjective:    Patient ID: Amanda [name], female    DOB: 28-Dec-1933, 81 y.o.   MRN: [id number]  HPI  Patient with past history of hypercholesterolemia, CAD, hypertension and hypothyroidism.  She comes in today to follow up on these issues as well as for a complete physical exam. She is accompanied by her husband.  History obtained from both of them.  She tripped over the dishwasher door.  [place] [address] and hit her left leg and left upper arm.  Her left hip is bruised.  Is better.  Able to walk.  No significant pain.  No head injury.  No chest pain or tightness. No sob.  No acid reflux.  No abdominal pain or cramping.  Bowels stable.  Overall she feels good.     Past Medical History  Diagnosis Date  . Hypertension   . Hypercholesterolemia   . Hypothyroidism   . Dementia   . Carotid bruit     bilaterally   Past Surgical History  Procedure Laterality Date  . Carpal tunnel release    . Cholecystectomy  1991  . Thoracic aorta stent  12/13    heart stent   Family History  Problem Relation Age of Onset  . Hypertension Father   . Lung cancer Father   . Arthritis    . Hypertension Mother   . Dementia Mother   . Breast cancer      cousin  . Colon cancer Neg Hx    Social History   Social History  . Marital Status: Married    Spouse Name: N/A  . Number of Children: 2  . Years of Education: N/A   Occupational History  . retired Pharmacist, hospital    Social History Main Topics  . Smoking status: Never Smoker   . Smokeless tobacco: Never Used  . Alcohol Use: No  . Drug Use: No  . Sexual Activity: Not Asked   Other Topics Concern  . None   Social History Narrative   She is an only child. 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    Outpatient Encounter Prescriptions as of 12/27/2014  Medication Sig  . aspirin 325 MG EC tablet Take 325 mg by mouth daily.  Marland Kitchen donepezil (ARICEPT) 10  MG tablet TAKE ONE TABLET AT BEDTIME.  Marland Kitchen levothyroxine (SYNTHROID) 75 MCG tablet Take 0.5 tablets (37.5 mcg total) by mouth daily.  Marland Kitchen lisinopril (PRINIVIL,ZESTRIL) 5 MG tablet Take 1 tablet (5 mg total) by mouth daily.  . metoprolol succinate (TOPROL-XL) 25 MG 24 hr tablet TAKE (1) TABLET BY MOUTH EVERY DAY  . [DISCONTINUED] pravastatin (PRAVACHOL) 10 MG tablet Take 1 tablet (10 mg total) by mouth daily.  . mupirocin ointment (BACTROBAN) 2 % Place 1 application into the nose 2 (two) times daily.  . pravastatin (PRAVACHOL) 20 MG tablet Take 1 tablet (20 mg total) by mouth daily.   No facility-administered encounter medications on file as of 12/27/2014.    Review of Systems  Constitutional: Negative for appetite change and unexpected weight change.  HENT: Negative for congestion and sinus pressure.   Eyes: Negative for pain and visual disturbance.  Respiratory: Negative for cough, chest tightness and shortness of breath.   Cardiovascular: Negative for chest pain, palpitations and leg swelling.  Gastrointestinal: Negative for nausea, vomiting, abdominal pain and diarrhea.  Genitourinary: Negative for dysuria and difficulty urinating.  Musculoskeletal: Negative for back pain and joint swelling.  Skin: Negative for color change and rash.  Neurological: Negative for dizziness, light-headedness and headaches.  Hematological: Negative for adenopathy. Does not bruise/bleed easily.  Psychiatric/Behavioral: Negative for dysphoric mood and agitation.       Objective:    Physical Exam  Constitutional: She is oriented to person, place, and time. She appears well-developed and well-nourished. No distress.  HENT:  Nose: Nose normal.  Mouth/Throat: Oropharynx is clear and moist.  Eyes: Right eye exhibits no discharge. Left eye exhibits no discharge. No scleral icterus.  Neck: Neck supple. No thyromegaly present.  Cardiovascular: Normal rate and regular rhythm.   Pulmonary/Chest: Breath sounds  normal. No accessory muscle usage. No tachypnea. No respiratory distress. She has no decreased breath sounds. She has no wheezes. She has no rhonchi. Right breast exhibits no inverted nipple, no mass, no nipple discharge and no tenderness (no axillary adenopathy). Left breast exhibits no inverted nipple, no mass, no nipple discharge and no tenderness (no axilarry adenopathy).  Abdominal: Soft. Bowel sounds are normal. There is no tenderness.  Musculoskeletal: She exhibits no edema or tenderness.  Lymphadenopathy:    She has no cervical adenopathy.  Neurological: She is alert and oriented to person, place, and time.  Skin: Skin is warm. No rash noted. No erythema.  Hematoma left hip.  Able to walk without significant difficulty or pain.  Good rom.    Psychiatric: She has a normal mood and affect. Her behavior is normal.    BP 130/70 mmHg  Pulse 80  Temp(Src) 98.2 F (36.8 C) (Oral)  Resp 18  Ht _0  (1.6 m)  Wt 140 lb (63.504 kg)  BMI 24.81 kg/m2  SpO2 94% Wt Readings from Last 3 Encounters:  12/27/14 140 lb (63.504 kg)  08/23/14 135 lb 2 oz (61.292 kg)  05/19/14 132 lb 6.4 oz (60.056 kg)     Lab Results  Component Value Date   WBC 6.8 08/21/2014   HGB 13.9 08/21/2014   HCT 41.8 08/21/2014   PLT 203.0 08/21/2014   GLUCOSE 82 12/26/2014   CHOL 183 12/26/2014   TRIG 106.0 12/26/2014   HDL 51.40 12/26/2014   LDLCALC 111* 12/26/2014   ALT 9 12/26/2014   AST 15 12/26/2014   NA 143 12/26/2014   K 4.9 12/26/2014   CL 104 12/26/2014   CREATININE 0.83 12/26/2014   BUN 18 12/26/2014   CO2 33* 12/26/2014   TSH 1.89 03/23/2014   HGBA1C 6.1 12/26/2014   MICROALBUR 1.8 08/21/2014    Mm Diag Breast Tomo Uni Left  09/18/2014  CLINICAL DATA:  Recall from screening mammogram. EXAM: DIGITAL DIAGNOSTIC LEFT MAMMOGRAM WITH 3D TOMOSYNTHESIS AND CAD COMPARISON:  Previous exam(s). ACR Breast Density Category b: There are scattered areas of fibroglandular density. FINDINGS: Additional  evaluation of the left breast demonstrates no persistent abnormality. The appearance noted on the screening study is consistent with a summation shadow Mammographic images were processed with CAD. IMPRESSION: No persistent abnormality on additional evaluation of the left breast. RECOMMENDATION: Bilateral screening mammography in 1 year. I have discussed the findings and recommendations with the patient. Results were also provided in writing at the conclusion of the visit. If applicable, a reminder letter will be sent to the patient regarding the next appointment. BI-RADS CATEGORY  1: Negative. Electronically Signed   By: Altamese Cabal M.D.   On: 09/18/2014 11:47       Assessment & Plan:   Problem List Items Addressed This  Visit    CAD (coronary artery disease)    Sees cardiology.  Continue risk factor modification.  Stable.  Just evaluated 08/2014.        Relevant Medications   pravastatin (PRAVACHOL) 20 MG tablet   Dementia    On aricept.  Sees neurology.        Diabetes ([place])    Low carb diet and exercise.  Follow met b and a1c.        Relevant Medications   pravastatin (PRAVACHOL) 20 MG tablet   Fall    S/p fall - one week ago.  No significant pain.  Resolving bruising and hematoma - left upper arm and left leg.  Hold on xray.  Follow.        Health care maintenance    Physical today 12/27/14.   Mammogram 09/18/14 - Birads I.  Colonoscopy 06/11/06.        Hypercholesteremia    Lo w cholesterol diet and exercise.   Lab Results  Component Value Date   CHOL 183 12/26/2014   HDL 51.40 12/26/2014   LDLCALC 111* 12/26/2014   TRIG 106.0 12/26/2014   CHOLHDL 4 12/26/2014  LDL slightly elevated above goal with h/o CAD.  Increase pravastatin to 51m q day.  Follow.        Relevant Medications   pravastatin (PRAVACHOL) 20 MG tablet   Hypertension    Blood pressure has been under good control.  Continue same medication regimen.  Follow pressures.  Follow metabolic panel.          Relevant Medications   pravastatin (PRAVACHOL) 20 MG tablet   Hypothyroidism    On thyroid replacement.  Follow tsh.         Other Visit Diagnoses    Hypercholesterolemia    -  Primary    Relevant Medications    pravastatin (PRAVACHOL) 20 MG tablet    Other Relevant Orders    Hepatic function panel        SEinar Pheasant MD

## 2014-12-31 ENCOUNTER — Encounter: Payer: Self-pay | Admitting: Internal Medicine

## 2014-12-31 DIAGNOSIS — W19XXXA Unspecified fall, initial encounter: Secondary | ICD-10-CM | POA: Insufficient documentation

## 2014-12-31 NOTE — Assessment & Plan Note (Signed)
Lo w cholesterol diet and exercise.   Lab Results  Component Value Date   CHOL 183 12/26/2014   HDL 51.40 12/26/2014   LDLCALC 111* 12/26/2014   TRIG 106.0 12/26/2014   CHOLHDL 4 12/26/2014  LDL slightly elevated above goal with h/o CAD.  Increase pravastatin to 20mg  q day.  Follow.

## 2014-12-31 NOTE — Assessment & Plan Note (Signed)
Low carb diet and exercise.  Follow met b and a1c.   

## 2014-12-31 NOTE — Assessment & Plan Note (Signed)
Physical today 12/27/14.   Mammogram 09/18/14 - Birads I.  Colonoscopy 06/11/06.

## 2014-12-31 NOTE — Assessment & Plan Note (Signed)
On thyroid replacement.  Follow tsh.  

## 2014-12-31 NOTE — Assessment & Plan Note (Signed)
On aricept.  Sees neurology.

## 2014-12-31 NOTE — Assessment & Plan Note (Signed)
S/p fall - one week ago.  No significant pain.  Resolving bruising and hematoma - left upper arm and left leg.  Hold on xray.  Follow.

## 2014-12-31 NOTE — Assessment & Plan Note (Signed)
Sees cardiology.  Continue risk factor modification.  Stable.  Just evaluated 08/2014.

## 2014-12-31 NOTE — Assessment & Plan Note (Addendum)
Blood pressure has been under good control.  Continue same medication regimen.  Follow pressures.  Follow metabolic panel.   

## 2015-01-26 ENCOUNTER — Other Ambulatory Visit (INDEPENDENT_AMBULATORY_CARE_PROVIDER_SITE_OTHER): Payer: Medicare Other

## 2015-01-26 DIAGNOSIS — E7800 Pure hypercholesterolemia, unspecified: Secondary | ICD-10-CM

## 2015-01-26 LAB — HEPATIC FUNCTION PANEL
ALBUMIN: 4 g/dL (ref 3.5–5.2)
ALT: 9 U/L (ref 0–35)
AST: 16 U/L (ref 0–37)
Alkaline Phosphatase: 76 U/L (ref 39–117)
BILIRUBIN TOTAL: 0.6 mg/dL (ref 0.2–1.2)
Bilirubin, Direct: 0.1 mg/dL (ref 0.0–0.3)
Total Protein: 6.7 g/dL (ref 6.0–8.3)

## 2015-01-29 ENCOUNTER — Other Ambulatory Visit: Payer: Self-pay | Admitting: Internal Medicine

## 2015-01-29 DIAGNOSIS — E7800 Pure hypercholesterolemia, unspecified: Secondary | ICD-10-CM

## 2015-01-29 NOTE — Progress Notes (Signed)
Order placed for f/u liver panel.  

## 2015-01-30 ENCOUNTER — Encounter: Payer: Self-pay | Admitting: *Deleted

## 2015-02-19 ENCOUNTER — Other Ambulatory Visit: Payer: Self-pay | Admitting: Nurse Practitioner

## 2015-03-13 ENCOUNTER — Other Ambulatory Visit (INDEPENDENT_AMBULATORY_CARE_PROVIDER_SITE_OTHER): Payer: Medicare Other

## 2015-03-13 DIAGNOSIS — E7800 Pure hypercholesterolemia, unspecified: Secondary | ICD-10-CM

## 2015-03-13 LAB — HEPATIC FUNCTION PANEL
ALT: 8 U/L (ref 0–35)
AST: 16 U/L (ref 0–37)
Albumin: 3.7 g/dL (ref 3.5–5.2)
Alkaline Phosphatase: 63 U/L (ref 39–117)
BILIRUBIN TOTAL: 0.4 mg/dL (ref 0.2–1.2)
Bilirubin, Direct: 0.1 mg/dL (ref 0.0–0.3)
Total Protein: 6.3 g/dL (ref 6.0–8.3)

## 2015-03-14 ENCOUNTER — Encounter: Payer: Self-pay | Admitting: *Deleted

## 2015-04-09 ENCOUNTER — Other Ambulatory Visit: Payer: Self-pay

## 2015-04-09 MED ORDER — DONEPEZIL HCL 10 MG PO TABS: ORAL_TABLET | ORAL | Status: DC

## 2015-04-09 NOTE — Telephone Encounter (Signed)
Please advise refill, thanks 

## 2015-04-09 NOTE — Telephone Encounter (Signed)
ok'd refill aricept #90 with one refill.

## 2015-04-13 ENCOUNTER — Other Ambulatory Visit: Payer: Self-pay

## 2015-04-13 MED ORDER — LISINOPRIL 5 MG PO TABS: 5.0000 mg | ORAL_TABLET | Freq: Every day | ORAL | Status: DC

## 2015-04-17 ENCOUNTER — Other Ambulatory Visit: Payer: Self-pay

## 2015-04-17 MED ORDER — LEVOTHYROXINE SODIUM 75 MCG PO TABS: 37.5000 ug | ORAL_TABLET | Freq: Every day | ORAL | Status: DC

## 2015-04-27 ENCOUNTER — Ambulatory Visit (INDEPENDENT_AMBULATORY_CARE_PROVIDER_SITE_OTHER): Payer: Medicare Other | Admitting: Internal Medicine

## 2015-04-27 ENCOUNTER — Encounter: Payer: Self-pay | Admitting: Internal Medicine

## 2015-04-27 VITALS — BP 162/50 | HR 76 | Temp 97.7°F | Resp 17 | Ht 63.0 in | Wt 143.0 lb

## 2015-04-27 DIAGNOSIS — I2510 Atherosclerotic heart disease of native coronary artery without angina pectoris: Secondary | ICD-10-CM

## 2015-04-27 DIAGNOSIS — F0390 Unspecified dementia without behavioral disturbance: Secondary | ICD-10-CM

## 2015-04-27 DIAGNOSIS — E7800 Pure hypercholesterolemia, unspecified: Secondary | ICD-10-CM

## 2015-04-27 DIAGNOSIS — E119 Type 2 diabetes mellitus without complications: Secondary | ICD-10-CM

## 2015-04-27 DIAGNOSIS — I10 Essential (primary) hypertension: Secondary | ICD-10-CM

## 2015-04-27 DIAGNOSIS — E039 Hypothyroidism, unspecified: Secondary | ICD-10-CM | POA: Diagnosis not present

## 2015-04-27 MED ORDER — LISINOPRIL 10 MG PO TABS: 10.0000 mg | ORAL_TABLET | Freq: Every day | ORAL | Status: DC

## 2015-04-27 MED ORDER — METOPROLOL SUCCINATE ER 25 MG PO TB24: ORAL_TABLET | ORAL | Status: DC

## 2015-04-27 MED ORDER — LEVOTHYROXINE SODIUM 75 MCG PO TABS: 37.5000 ug | ORAL_TABLET | Freq: Every day | ORAL | Status: DC

## 2015-04-27 MED ORDER — DONEPEZIL HCL 10 MG PO TABS: ORAL_TABLET | ORAL | Status: DC

## 2015-04-27 NOTE — Progress Notes (Signed)
Patient ID: Amanda [name], female   DOB: 1933-09-09, 82 y.o.   MRN: [id number]   Subjective:    Patient ID: Amanda [name], female    DOB: 01-31-1933, 82 y.o.   MRN: [id number]  HPI  Patient here for a scheduled follow up.  She is accompanied by her husband.  History obtained from both of them.  She is doing well.  Feels good.  Stays active.  No cardiac symptoms with increased activity or exertion.  No sob.  No acid reflux.  No abdominal pain or cramping.  Bowels stable.  Memory stable.  On aricept.  Blood pressure elevated.  Saw cardiology.  They had recommended increasing lisinopril to 98m q day.  She continues on 564mper day now.     Past Medical History  Diagnosis Date  . Hypertension   . Hypercholesterolemia   . Hypothyroidism   . Dementia   . Carotid bruit     bilaterally   Past Surgical History  Procedure Laterality Date  . Carpal tunnel release    . Cholecystectomy  1991  . Thoracic aorta stent  12/13    heart stent   Family History  Problem Relation Age of Onset  . Hypertension Father   . Lung cancer Father   . Arthritis    . Hypertension Mother   . Dementia Mother   . Breast cancer      cousin  . Colon cancer Neg Hx    Social History   Social History  . Marital Status: Married    Spouse Name: N/A  . Number of Children: 2  . Years of Education: N/A   Occupational History  . retired tePharmacist, hospital  Social History Main Topics  . Smoking status: Never Smoker   . Smokeless tobacco: Never Used  . Alcohol Use: No  . Drug Use: No  . Sexual Activity: Not Asked   Other Topics Concern  . None   Social History Narrative   She is an only child. 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    Outpatient Encounter Prescriptions as of 04/27/2015  Medication Sig  . aspirin 325 MG EC tablet Take 325 mg by mouth daily.  . Marland Kitchenonepezil (ARICEPT) 10 MG tablet TAKE ONE TABLET AT BEDTIME.  . Marland Kitchenevothyroxine (SYNTHROID) 75 MCG tablet Take 0.5 tablets  (37.5 mcg total) by mouth daily.  . metoprolol succinate (TOPROL-XL) 25 MG 24 hr tablet TAKE ONE (1) TABLET BY MOUTH ONCE DAILY  . mupirocin ointment (BACTROBAN) 2 % Place 1 application into the nose 2 (two) times daily.  . pravastatin (PRAVACHOL) 20 MG tablet Take 1 tablet (20 mg total) by mouth daily.  . [DISCONTINUED] donepezil (ARICEPT) 10 MG tablet TAKE ONE TABLET AT BEDTIME.  . [DISCONTINUED] levothyroxine (SYNTHROID) 75 MCG tablet Take 0.5 tablets (37.5 mcg total) by mouth daily.  . [DISCONTINUED] lisinopril (PRINIVIL,ZESTRIL) 5 MG tablet Take 1 tablet (5 mg total) by mouth daily.  . [DISCONTINUED] metoprolol succinate (TOPROL-XL) 25 MG 24 hr tablet TAKE ONE (1) TABLET BY MOUTH ONCE DAILY  . lisinopril (PRINIVIL,ZESTRIL) 10 MG tablet Take 1 tablet (10 mg total) by mouth daily.   No facility-administered encounter medications on file as of 04/27/2015.    Review of Systems  Constitutional: Negative for appetite change and unexpected weight change.  HENT: Negative for congestion and sinus pressure.   Respiratory: Negative for cough, chest tightness and shortness of breath.   Cardiovascular: Negative for chest  pain, palpitations and leg swelling.  Gastrointestinal: Negative for nausea, vomiting, abdominal pain and diarrhea.  Genitourinary: Negative for dysuria and difficulty urinating.  Musculoskeletal: Negative for back pain and joint swelling.  Skin: Negative for color change and rash.  Neurological: Negative for dizziness, light-headedness and headaches.  Psychiatric/Behavioral: Negative for dysphoric mood and agitation.       Objective:     Blood pressure rechecked by me:  158/60  Physical Exam  Constitutional: She appears well-developed and well-nourished. No distress.  HENT:  Nose: Nose normal.  Mouth/Throat: Oropharynx is clear and moist.  Neck: Neck supple. No thyromegaly present.  Cardiovascular: Normal rate and regular rhythm.   Pulmonary/Chest: Breath sounds normal.  No respiratory distress. She has no wheezes.  Abdominal: Soft. Bowel sounds are normal. There is no tenderness.  Musculoskeletal: She exhibits no edema or tenderness.  Lymphadenopathy:    She has no cervical adenopathy.  Skin: No rash noted. No erythema.  Psychiatric: She has a normal mood and affect. Her behavior is normal.    BP 162/50 mmHg  Pulse 76  Temp(Src) 97.7 F (36.5 C) (Oral)  Resp 17  Ht 5' 3" (1.6 m)  Wt 143 lb (64.864 kg)  BMI 25.34 kg/m2  SpO2 96% Wt Readings from Last 3 Encounters:  04/27/15 143 lb (64.864 kg)  12/27/14 140 lb (63.504 kg)  08/23/14 135 lb 2 oz (61.292 kg)     Lab Results  Component Value Date   WBC 6.8 08/21/2014   HGB 13.9 08/21/2014   HCT 41.8 08/21/2014   PLT 203.0 08/21/2014   GLUCOSE 82 12/26/2014   CHOL 183 12/26/2014   TRIG 106.0 12/26/2014   HDL 51.40 12/26/2014   LDLCALC 111* 12/26/2014   ALT 8 03/13/2015   AST 16 03/13/2015   NA 143 12/26/2014   K 4.9 12/26/2014   CL 104 12/26/2014   CREATININE 0.83 12/26/2014   BUN 18 12/26/2014   CO2 33* 12/26/2014   TSH 1.89 03/23/2014   HGBA1C 6.1 12/26/2014   MICROALBUR 1.8 08/21/2014    Mm Diag Breast Tomo Uni Left  09/18/2014  CLINICAL DATA:  Recall from screening mammogram. EXAM: DIGITAL DIAGNOSTIC LEFT MAMMOGRAM WITH 3D TOMOSYNTHESIS AND CAD COMPARISON:  Previous exam(s). ACR Breast Density Category b: There are scattered areas of fibroglandular density. FINDINGS: Additional evaluation of the left breast demonstrates no persistent abnormality. The appearance noted on the screening study is consistent with a summation shadow Mammographic images were processed with CAD. IMPRESSION: No persistent abnormality on additional evaluation of the left breast. RECOMMENDATION: Bilateral screening mammography in 1 year. I have discussed the findings and recommendations with the patient. Results were also provided in writing at the conclusion of the visit. If applicable, a reminder letter will be  sent to the patient regarding the next appointment. BI-RADS CATEGORY  1: Negative. Electronically Signed   By: Polk  Jackson M.D.   On: 09/18/2014 11:47       Assessment & Plan:   Problem List Items Addressed This Visit    CAD (coronary artery disease)    Sees cardiology.  Stable.  Continue risk factor modification.        Relevant Medications   metoprolol succinate (TOPROL-XL) 25 MG 24 hr tablet   lisinopril (PRINIVIL,ZESTRIL) 10 MG tablet   Dementia    Followed by neurology.  Sees neurology.        Relevant Medications   donepezil (ARICEPT) 10 MG tablet   Diabetes ([facility])    Low carb diet   and exercise.  Follow met b and a1c.       Relevant Medications   lisinopril (PRINIVIL,ZESTRIL) 10 MG tablet   Other Relevant Orders   Hemoglobin A1c   Hypercholesteremia    Low cholesterol diet and exercise.  Follow lipid panel.  On pravastatin.  Follow liver panel.       Relevant Medications   metoprolol succinate (TOPROL-XL) 25 MG 24 hr tablet   lisinopril (PRINIVIL,ZESTRIL) 10 MG tablet   Other Relevant Orders   Lipid panel   Hepatic function panel   Hypertension - Primary    Blood pressure elevated.  Increase lisinopril to 52m q day.  Follow pressures.  Follow metabolic panel.  Have her husband spot check her pressure.        Relevant Medications   metoprolol succinate (TOPROL-XL) 25 MG 24 hr tablet   lisinopril (PRINIVIL,ZESTRIL) 10 MG tablet   Other Relevant Orders   Basic metabolic panel   Hypothyroidism    On thyroid replacement.  Follow tsh.        Relevant Medications   metoprolol succinate (TOPROL-XL) 25 MG 24 hr tablet   levothyroxine (SYNTHROID) 75 MCG tablet   Other Relevant Orders   TSH       SEinar Pheasant MD

## 2015-04-27 NOTE — Progress Notes (Signed)
Pre-visit discussion using our clinic review tool. No additional management support is needed unless otherwise documented below in the visit note.  

## 2015-04-29 ENCOUNTER — Encounter: Payer: Self-pay | Admitting: Internal Medicine

## 2015-04-29 NOTE — Assessment & Plan Note (Signed)
On thyroid replacement.  Follow tsh.  

## 2015-04-29 NOTE — Assessment & Plan Note (Signed)
Low cholesterol diet and exercise.  Follow lipid panel.  On pravastatin.  Follow liver panel.

## 2015-04-29 NOTE — Assessment & Plan Note (Signed)
Blood pressure elevated.  Increase lisinopril to 10mg  q day.  Follow pressures.  Follow metabolic panel.  Have her husband spot check her pressure.

## 2015-04-29 NOTE — Assessment & Plan Note (Signed)
Sees cardiology.  Stable.  Continue risk factor modification.

## 2015-04-29 NOTE — Assessment & Plan Note (Signed)
Low carb diet and exercise.  Follow met b and a1c.  

## 2015-04-29 NOTE — Assessment & Plan Note (Signed)
Followed by neurology.  Sees neurology.

## 2015-08-02 ENCOUNTER — Other Ambulatory Visit: Payer: Self-pay | Admitting: Internal Medicine

## 2015-08-02 DIAGNOSIS — Z1231 Encounter for screening mammogram for malignant neoplasm of breast: Secondary | ICD-10-CM

## 2015-08-23 ENCOUNTER — Other Ambulatory Visit (INDEPENDENT_AMBULATORY_CARE_PROVIDER_SITE_OTHER): Payer: Medicare Other

## 2015-08-23 DIAGNOSIS — I10 Essential (primary) hypertension: Secondary | ICD-10-CM | POA: Diagnosis not present

## 2015-08-23 DIAGNOSIS — E119 Type 2 diabetes mellitus without complications: Secondary | ICD-10-CM

## 2015-08-23 DIAGNOSIS — E7800 Pure hypercholesterolemia, unspecified: Secondary | ICD-10-CM | POA: Diagnosis not present

## 2015-08-23 DIAGNOSIS — E039 Hypothyroidism, unspecified: Secondary | ICD-10-CM | POA: Diagnosis not present

## 2015-08-23 LAB — HEPATIC FUNCTION PANEL
ALT: 9 U/L (ref 0–35)
AST: 15 U/L (ref 0–37)
Albumin: 4.2 g/dL (ref 3.5–5.2)
Alkaline Phosphatase: 76 U/L (ref 39–117)
BILIRUBIN DIRECT: 0.1 mg/dL (ref 0.0–0.3)
TOTAL PROTEIN: 7.4 g/dL (ref 6.0–8.3)
Total Bilirubin: 0.7 mg/dL (ref 0.2–1.2)

## 2015-08-23 LAB — LIPID PANEL
CHOLESTEROL: 202 mg/dL — AB (ref 0–200)
HDL: 50.8 mg/dL (ref >39.00)
LDL CALC: 123 mg/dL — AB (ref 0–99)
NonHDL: 150.78
TRIGLYCERIDES: 140 mg/dL (ref 0.0–149.0)
Total CHOL/HDL Ratio: 4
VLDL: 28 mg/dL (ref 0.0–40.0)

## 2015-08-23 LAB — TSH: TSH: 2.13 u[IU]/mL (ref 0.35–4.50)

## 2015-08-23 LAB — BASIC METABOLIC PANEL
BUN: 31 mg/dL — ABNORMAL HIGH (ref 6–23)
CALCIUM: 10.3 mg/dL (ref 8.4–10.5)
CO2: 31 meq/L (ref 19–32)
CREATININE: 1.22 mg/dL — AB (ref 0.40–1.20)
Chloride: 102 mEq/L (ref 96–112)
GFR: 45.04 mL/min — AB (ref >60.00)
GLUCOSE: 114 mg/dL — AB (ref 70–99)
Potassium: 5.2 mEq/L — ABNORMAL HIGH (ref 3.5–5.1)
SODIUM: 139 meq/L (ref 135–145)

## 2015-08-23 LAB — HEMOGLOBIN A1C: HEMOGLOBIN A1C: 6.3 % (ref 4.6–6.5)

## 2015-08-24 NOTE — Progress Notes (Signed)
Advised. Agrees with treatment plan. Allene Dillon, CMA

## 2015-08-28 ENCOUNTER — Ambulatory Visit (INDEPENDENT_AMBULATORY_CARE_PROVIDER_SITE_OTHER): Payer: Medicare Other | Admitting: Internal Medicine

## 2015-08-28 ENCOUNTER — Encounter: Payer: Self-pay | Admitting: Internal Medicine

## 2015-08-28 VITALS — BP 122/80 | HR 44 | Temp 97.6°F | Resp 17 | Ht 63.0 in | Wt 145.0 lb

## 2015-08-28 DIAGNOSIS — F0390 Unspecified dementia without behavioral disturbance: Secondary | ICD-10-CM | POA: Diagnosis not present

## 2015-08-28 DIAGNOSIS — R748 Abnormal levels of other serum enzymes: Secondary | ICD-10-CM

## 2015-08-28 DIAGNOSIS — E119 Type 2 diabetes mellitus without complications: Secondary | ICD-10-CM

## 2015-08-28 DIAGNOSIS — E7800 Pure hypercholesterolemia, unspecified: Secondary | ICD-10-CM

## 2015-08-28 DIAGNOSIS — E039 Hypothyroidism, unspecified: Secondary | ICD-10-CM

## 2015-08-28 DIAGNOSIS — I2510 Atherosclerotic heart disease of native coronary artery without angina pectoris: Secondary | ICD-10-CM

## 2015-08-28 DIAGNOSIS — I10 Essential (primary) hypertension: Secondary | ICD-10-CM

## 2015-08-28 DIAGNOSIS — R7989 Other specified abnormal findings of blood chemistry: Secondary | ICD-10-CM

## 2015-08-28 LAB — BASIC METABOLIC PANEL
BUN: 20 mg/dL (ref 6–23)
CALCIUM: 10.3 mg/dL (ref 8.4–10.5)
CO2: 31 mEq/L (ref 19–32)
Chloride: 104 mEq/L (ref 96–112)
Creatinine, Ser: 0.99 mg/dL (ref 0.40–1.20)
GFR: 57.32 mL/min — AB (ref >60.00)
GLUCOSE: 99 mg/dL (ref 70–99)
POTASSIUM: 5.3 meq/L — AB (ref 3.5–5.1)
Sodium: 143 mEq/L (ref 135–145)

## 2015-08-28 MED ORDER — DONEPEZIL HCL 10 MG PO TABS: ORAL_TABLET | ORAL | 3 refills | Status: DC

## 2015-08-28 NOTE — Progress Notes (Signed)
Patient ID: Amanda [name], female   DOB: October 09, 1933, 82 y.o.   MRN: [id number]   Subjective:    Patient ID: Amanda [name], female    DOB: 09-26-1933, 82 y.o.   MRN: [id number]  HPI  Patient here for a scheduled follow up.  She is accompanied by her husband.  History obtained from both of them.  She is doing well.  Feels good.  Stays active.  No cardiac symptoms with increased activity or exertion.  No sob.  No acid reflux.  No abdominal pain or cramping.  Bowels stable.  Discussed staying hydrated.  Discussed recent labs.  Elevated creatinine.  Memory stable.     Past Medical History:  Diagnosis Date  . Carotid bruit    bilaterally  . Dementia   . Hypercholesterolemia   . Hypertension   . Hypothyroidism    Past Surgical History:  Procedure Laterality Date  . CARPAL TUNNEL RELEASE    . CHOLECYSTECTOMY  1991  . THORACIC AORTA STENT  12/13   heart stent   Family History  Problem Relation Age of Onset  . Hypertension Father   . Lung cancer Father   . Hypertension Mother   . Dementia Mother   . Arthritis    . Breast cancer      cousin  . Colon cancer Neg Hx    Social History   Social History  . Marital status: Married    Spouse name: N/A  . Number of children: 2  . Years of education: N/A   Occupational History  . retired Pharmacist, hospital    Social History Main Topics  . Smoking status: Never Smoker  . Smokeless tobacco: Never Used  . Alcohol use No  . Drug use: No  . Sexual activity: Not Asked   Other Topics Concern  . None   Social History Narrative   She is an only child. 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    Outpatient Encounter Prescriptions as of 08/28/2015  Medication Sig  . aspirin 325 MG EC tablet Take 325 mg by mouth daily.  Marland Kitchen donepezil (ARICEPT) 10 MG tablet TAKE ONE TABLET AT BEDTIME.  Marland Kitchen levothyroxine (SYNTHROID) 75 MCG tablet Take 0.5 tablets (37.5 mcg total) by mouth daily.  Marland Kitchen lisinopril (PRINIVIL,ZESTRIL) 20 MG  tablet Take 20 mg by mouth daily.  . metoprolol succinate (TOPROL-XL) 25 MG 24 hr tablet TAKE ONE (1) TABLET BY MOUTH ONCE DAILY  . pravastatin (PRAVACHOL) 20 MG tablet Take 1 tablet (20 mg total) by mouth daily.  . [DISCONTINUED] donepezil (ARICEPT) 10 MG tablet TAKE ONE TABLET AT BEDTIME.  . [DISCONTINUED] lisinopril (PRINIVIL,ZESTRIL) 10 MG tablet Take 1 tablet (10 mg total) by mouth daily.  . [DISCONTINUED] mupirocin ointment (BACTROBAN) 2 % Place 1 application into the nose 2 (two) times daily.   No facility-administered encounter medications on file as of 08/28/2015.     Review of Systems  Constitutional: Negative for appetite change and unexpected weight change.  HENT: Negative for congestion and sinus pressure.   Respiratory: Negative for cough, chest tightness and shortness of breath.   Cardiovascular: Negative for chest pain, palpitations and leg swelling.  Gastrointestinal: Negative for abdominal pain, diarrhea, nausea and vomiting.  Genitourinary: Negative for difficulty urinating and dysuria.  Musculoskeletal: Negative for back pain and joint swelling.  Skin: Negative for color change and rash.  Neurological: Negative for dizziness, light-headedness and headaches.  Psychiatric/Behavioral: Negative for agitation and dysphoric mood.  Objective:     Blood pressure rechecked by me:  138/68  Physical Exam  Constitutional: She appears well-developed and well-nourished. No distress.  HENT:  Nose: Nose normal.  Mouth/Throat: Oropharynx is clear and moist.  Neck: Neck supple. No thyromegaly present.  Cardiovascular: Normal rate and regular rhythm.   Pulmonary/Chest: Breath sounds normal. No respiratory distress. She has no wheezes.  Abdominal: Soft. Bowel sounds are normal. There is no tenderness.  Musculoskeletal: She exhibits no edema or tenderness.  Lymphadenopathy:    She has no cervical adenopathy.  Skin: No rash noted. No erythema.  Psychiatric: She has a normal  mood and affect. Her behavior is normal.    BP 122/80 (BP Location: Left Arm, Patient Position: Sitting, Cuff Size: Normal)   Pulse (!) 44   Temp 97.6 F (36.4 C) (Oral)   Resp 17   Ht 5' 3"  (1.6 m)   Wt 145 lb (65.8 kg)   SpO2 99%   BMI 25.69 kg/m  Wt Readings from Last 3 Encounters:  08/28/15 145 lb (65.8 kg)  04/27/15 143 lb (64.9 kg)  12/27/14 140 lb (63.5 kg)     Lab Results  Component Value Date   WBC 6.8 08/21/2014   HGB 13.9 08/21/2014   HCT 41.8 08/21/2014   PLT 203.0 08/21/2014   GLUCOSE 99 08/28/2015   CHOL 202 (H) 08/23/2015   TRIG 140.0 08/23/2015   HDL 50.80 08/23/2015   LDLCALC 123 (H) 08/23/2015   ALT 9 08/23/2015   AST 15 08/23/2015   NA 143 08/28/2015   K 5.3 (H) 08/28/2015   CL 104 08/28/2015   CREATININE 0.99 08/28/2015   BUN 20 08/28/2015   CO2 31 08/28/2015   TSH 2.13 08/23/2015   HGBA1C 6.3 08/23/2015   MICROALBUR 1.8 08/21/2014    Mm Diag Breast Tomo Uni Left  Result Date: 09/18/2014 CLINICAL DATA:  Recall from screening mammogram. EXAM: DIGITAL DIAGNOSTIC LEFT MAMMOGRAM WITH 3D TOMOSYNTHESIS AND CAD COMPARISON:  Previous exam(s). ACR Breast Density Category b: There are scattered areas of fibroglandular density. FINDINGS: Additional evaluation of the left breast demonstrates no persistent abnormality. The appearance noted on the screening study is consistent with a summation shadow Mammographic images were processed with CAD. IMPRESSION: No persistent abnormality on additional evaluation of the left breast. RECOMMENDATION: Bilateral screening mammography in 1 year. I have discussed the findings and recommendations with the patient. Results were also provided in writing at the conclusion of the visit. If applicable, a reminder letter will be sent to the patient regarding the next appointment. BI-RADS CATEGORY  1: Negative. Electronically Signed   By: Altamese Cabal M.D.   On: 09/18/2014 11:47       Assessment & Plan:   Problem List Items  Addressed This Visit    CAD (coronary artery disease)    Sees cardiology.  Continue risk factor modification.  Stable.       Relevant Medications   lisinopril (PRINIVIL,ZESTRIL) 20 MG tablet   Dementia    Followed by neurology.  On aricept.  Relatively stable.       Relevant Medications   donepezil (ARICEPT) 10 MG tablet   Diabetes (HCC)    Low carb diet and exercise.  Follow met b and a1c        Relevant Medications   lisinopril (PRINIVIL,ZESTRIL) 20 MG tablet   Elevated serum creatinine - Primary    Discussed with her and her husband today.  Recent lab with slightly elevated creatinine.  Stay hydrated.  Recheck.      Relevant Orders   Basic metabolic panel (Completed)   Hypercholesteremia    On pravastatin.  Low cholesterol diet and exercise.  Follow lipid panel and liver function tests.  Discussed recent lab values.  Hold on increasing.  Follow.       Relevant Medications   lisinopril (PRINIVIL,ZESTRIL) 20 MG tablet   Hypertension    Blood pressure under good control.  Continue same medication regimen.  Follow pressures.  Follow metabolic panel.        Relevant Medications   lisinopril (PRINIVIL,ZESTRIL) 20 MG tablet   Hypothyroidism    On thyroid replacement.  Follow tsh.        Other Visit Diagnoses   None.      Einar Pheasant, MD

## 2015-08-28 NOTE — Progress Notes (Signed)
Pre-visit discussion using our clinic review tool. No additional management support is needed unless otherwise documented below in the visit note.  

## 2015-08-30 ENCOUNTER — Telehealth: Payer: Self-pay | Admitting: Internal Medicine

## 2015-08-30 DIAGNOSIS — E875 Hyperkalemia: Secondary | ICD-10-CM

## 2015-08-30 NOTE — Telephone Encounter (Signed)
Order placed for stat potassium to be drawn at [facility]

## 2015-09-02 ENCOUNTER — Encounter: Payer: Self-pay | Admitting: Internal Medicine

## 2015-09-02 DIAGNOSIS — R7989 Other specified abnormal findings of blood chemistry: Secondary | ICD-10-CM | POA: Insufficient documentation

## 2015-09-02 NOTE — Assessment & Plan Note (Signed)
On thyroid replacement.  Follow tsh.  

## 2015-09-02 NOTE — Assessment & Plan Note (Signed)
Followed by neurology.  On aricept.  Relatively stable.

## 2015-09-02 NOTE — Assessment & Plan Note (Signed)
Discussed with her and her husband today.  Recent lab with slightly elevated creatinine.  Stay hydrated.  Recheck.

## 2015-09-02 NOTE — Assessment & Plan Note (Signed)
Low carb diet and exercise.  Follow met b and a1c.   

## 2015-09-02 NOTE — Assessment & Plan Note (Signed)
Blood pressure under good control.  Continue same medication regimen.  Follow pressures.  Follow metabolic panel.   

## 2015-09-02 NOTE — Assessment & Plan Note (Signed)
On pravastatin.  Low cholesterol diet and exercise.  Follow lipid panel and liver function tests.  Discussed recent lab values.  Hold on increasing.  Follow.

## 2015-09-02 NOTE — Assessment & Plan Note (Signed)
Sees cardiology.  Continue risk factor modification.  Stable.

## 2015-09-03 ENCOUNTER — Other Ambulatory Visit
Admission: RE | Admit: 2015-09-03 | Discharge: 2015-09-03 | Disposition: A | Discharge status: Home / Self Care | Payer: Medicare Other | Source: Ambulatory Visit | Attending: Internal Medicine | Admitting: Internal Medicine

## 2015-09-03 ENCOUNTER — Other Ambulatory Visit: Payer: Self-pay | Admitting: Internal Medicine

## 2015-09-03 ENCOUNTER — Ambulatory Visit
Admission: RE | Admit: 2015-09-03 | Discharge: 2015-09-03 | Disposition: A | Discharge status: Home / Self Care | Payer: Medicare Other | Source: Ambulatory Visit | Attending: Internal Medicine | Admitting: Internal Medicine

## 2015-09-03 DIAGNOSIS — E875 Hyperkalemia: Secondary | ICD-10-CM | POA: Insufficient documentation

## 2015-09-03 DIAGNOSIS — Z1231 Encounter for screening mammogram for malignant neoplasm of breast: Secondary | ICD-10-CM | POA: Diagnosis present

## 2015-09-03 LAB — POTASSIUM: Potassium: 4.1 mmol/L (ref 3.5–5.1)

## 2015-10-22 ENCOUNTER — Other Ambulatory Visit: Payer: Self-pay

## 2015-10-22 MED ORDER — PRAVASTATIN SODIUM 20 MG PO TABS: 20.0000 mg | ORAL_TABLET | Freq: Every day | ORAL | 1 refills | Status: DC

## 2015-12-28 ENCOUNTER — Encounter: Payer: Self-pay | Admitting: Internal Medicine

## 2015-12-28 ENCOUNTER — Ambulatory Visit (INDEPENDENT_AMBULATORY_CARE_PROVIDER_SITE_OTHER): Payer: Medicare Other | Admitting: Internal Medicine

## 2015-12-28 DIAGNOSIS — E039 Hypothyroidism, unspecified: Secondary | ICD-10-CM | POA: Diagnosis not present

## 2015-12-28 DIAGNOSIS — F0390 Unspecified dementia without behavioral disturbance: Secondary | ICD-10-CM

## 2015-12-28 DIAGNOSIS — E119 Type 2 diabetes mellitus without complications: Secondary | ICD-10-CM | POA: Diagnosis not present

## 2015-12-28 DIAGNOSIS — I2510 Atherosclerotic heart disease of native coronary artery without angina pectoris: Secondary | ICD-10-CM

## 2015-12-28 DIAGNOSIS — E7800 Pure hypercholesterolemia, unspecified: Secondary | ICD-10-CM

## 2015-12-28 DIAGNOSIS — I10 Essential (primary) hypertension: Secondary | ICD-10-CM

## 2015-12-28 MED ORDER — DONEPEZIL HCL 10 MG PO TABS: ORAL_TABLET | ORAL | 3 refills | Status: DC

## 2015-12-28 MED ORDER — LEVOTHYROXINE SODIUM 75 MCG PO TABS: 37.5000 ug | ORAL_TABLET | Freq: Every day | ORAL | 1 refills | Status: DC

## 2015-12-28 MED ORDER — METOPROLOL SUCCINATE ER 25 MG PO TB24: ORAL_TABLET | ORAL | 3 refills | Status: DC

## 2015-12-28 MED ORDER — LISINOPRIL 20 MG PO TABS: 20.0000 mg | ORAL_TABLET | Freq: Every day | ORAL | 3 refills | Status: DC

## 2015-12-28 MED ORDER — PRAVASTATIN SODIUM 20 MG PO TABS: 20.0000 mg | ORAL_TABLET | Freq: Every day | ORAL | 3 refills | Status: DC

## 2015-12-28 NOTE — Progress Notes (Signed)
Patient ID: Amanda [name], female   DOB: 1933/05/09, 82 y.o.   MRN: [id number]   Subjective:    Patient ID: Amanda [name], female    DOB: 1933-07-12, 82 y.o.   MRN: [id number]  HPI  Patient here for her physical exam.  She is accompanied by her husband.  History obtained from both of them.  She has a history of CAD.  Sees cardiology.  Just evaluated 12/11/15.  Felt things were stable.  She feels good.  Stays active.  No chest pain.  No sob.  No acid reflux.  No abdominal pain or cramping.  Bowels stable.  Overall feels good.  Husband reports her memory change - stable.     Past Medical History:  Diagnosis Date  . Carotid bruit    bilaterally  . Dementia   . Hypercholesterolemia   . Hypertension   . Hypothyroidism    Past Surgical History:  Procedure Laterality Date  . CARPAL TUNNEL RELEASE    . CHOLECYSTECTOMY  1991  . THORACIC AORTA STENT  12/13   heart stent   Family History  Problem Relation Age of Onset  . Hypertension Father   . Lung cancer Father   . Hypertension Mother   . Dementia Mother   . Arthritis    . Breast cancer Cousin   . Colon cancer Neg Hx    Social History   Social History  . Marital status: Married    Spouse name: N/A  . Number of children: 2  . Years of education: N/A   Occupational History  . retired Pharmacist, hospital    Social History Main Topics  . Smoking status: Never Smoker  . Smokeless tobacco: Never Used  . Alcohol use No  . Drug use: No  . Sexual activity: Not Asked   Other Topics Concern  . None   Social History Narrative   She is an only child. 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    Outpatient Encounter Prescriptions as of 12/28/2015  Medication Sig  . aspirin 325 MG EC tablet Take 325 mg by mouth daily.  Marland Kitchen donepezil (ARICEPT) 10 MG tablet TAKE ONE TABLET AT BEDTIME.  Marland Kitchen levothyroxine (SYNTHROID) 75 MCG tablet Take 0.5 tablets (37.5 mcg total) by mouth daily.  Marland Kitchen lisinopril (PRINIVIL,ZESTRIL) 20 MG  tablet Take 1 tablet (20 mg total) by mouth daily.  . metoprolol succinate (TOPROL-XL) 25 MG 24 hr tablet TAKE ONE (1) TABLET BY MOUTH ONCE DAILY  . pravastatin (PRAVACHOL) 20 MG tablet Take 1 tablet (20 mg total) by mouth daily.  . [DISCONTINUED] donepezil (ARICEPT) 10 MG tablet TAKE ONE TABLET AT BEDTIME.  . [DISCONTINUED] levothyroxine (SYNTHROID) 75 MCG tablet Take 0.5 tablets (37.5 mcg total) by mouth daily.  . [DISCONTINUED] lisinopril (PRINIVIL,ZESTRIL) 20 MG tablet Take 20 mg by mouth daily.  . [DISCONTINUED] metoprolol succinate (TOPROL-XL) 25 MG 24 hr tablet TAKE ONE (1) TABLET BY MOUTH ONCE DAILY  . [DISCONTINUED] pravastatin (PRAVACHOL) 20 MG tablet Take 1 tablet (20 mg total) by mouth daily.   No facility-administered encounter medications on file as of 12/28/2015.     Review of Systems  Constitutional: Negative for appetite change and unexpected weight change.  HENT: Negative for congestion and sinus pain.   Respiratory: Negative for cough, chest tightness and shortness of breath.   Cardiovascular: Negative for chest pain, palpitations and leg swelling.  Gastrointestinal: Negative for abdominal pain, diarrhea, nausea and vomiting.  Musculoskeletal: Negative for  back pain and joint swelling.  Skin: Negative for color change and rash.  Neurological: Negative for dizziness, light-headedness and headaches.  Psychiatric/Behavioral: Negative for agitation and dysphoric mood.       Objective:     Blood pressure rechecked by me:  156/78  Physical Exam  Constitutional: She appears well-developed and well-nourished. No distress.  HENT:  Nose: Nose normal.  Mouth/Throat: Oropharynx is clear and moist.  Neck: Neck supple. No thyromegaly present.  Cardiovascular: Normal rate and regular rhythm.   Pulmonary/Chest: Breath sounds normal. No respiratory distress. She has no wheezes.  Abdominal: Soft. Bowel sounds are normal. There is no tenderness.  Musculoskeletal: She exhibits no  edema or tenderness.  Lymphadenopathy:    She has no cervical adenopathy.  Skin: No rash noted. No erythema.  Psychiatric: She has a normal mood and affect. Her behavior is normal.    BP (!) 162/82   Pulse 60   Temp 97.6 F (36.4 C) (Oral)   Ht 5' 3"  (1.6 m)   Wt 149 lb 9.6 oz (67.9 kg)   SpO2 98%   BMI 26.50 kg/m  Wt Readings from Last 3 Encounters:  12/28/15 149 lb 9.6 oz (67.9 kg)  08/28/15 145 lb (65.8 kg)  04/27/15 143 lb (64.9 kg)     Lab Results  Component Value Date   WBC 6.8 08/21/2014   HGB 13.9 08/21/2014   HCT 41.8 08/21/2014   PLT 203.0 08/21/2014   GLUCOSE 99 08/28/2015   CHOL 202 (H) 08/23/2015   TRIG 140.0 08/23/2015   HDL 50.80 08/23/2015   LDLCALC 123 (H) 08/23/2015   ALT 9 08/23/2015   AST 15 08/23/2015   NA 143 08/28/2015   K 4.1 09/03/2015   CL 104 08/28/2015   CREATININE 0.99 08/28/2015   BUN 20 08/28/2015   CO2 31 08/28/2015   TSH 2.13 08/23/2015   HGBA1C 6.3 08/23/2015   MICROALBUR 1.8 08/21/2014    Mm Screening Breast Tomo Bilateral  Result Date: 09/03/2015 CLINICAL DATA:  Screening. EXAM: 2D DIGITAL SCREENING BILATERAL MAMMOGRAM WITH CAD AND ADJUNCT TOMO COMPARISON:  Previous exam(s). ACR Breast Density Category b: There are scattered areas of fibroglandular density. FINDINGS: There are no findings suspicious for malignancy. Images were processed with CAD. IMPRESSION: No mammographic evidence of malignancy. A result letter of this screening mammogram will be mailed directly to the patient. RECOMMENDATION: Screening mammogram in one year. (Code:SM-B-01Y) BI-RADS CATEGORY  1: Negative. Electronically Signed   By: Franki Cabot M.D.   On: 09/03/2015 11:17       Assessment & Plan:   Problem List Items Addressed This Visit    CAD (coronary artery disease)    Continue risk factor modification.  Followed by cardiology.       Relevant Medications   lisinopril (PRINIVIL,ZESTRIL) 20 MG tablet   metoprolol succinate (TOPROL-XL) 25 MG 24 hr  tablet   pravastatin (PRAVACHOL) 20 MG tablet   Dementia    Followed by neurology.  Stable.  On aricept.        Relevant Medications   donepezil (ARICEPT) 10 MG tablet   Diabetes (HCC)    Low carb diet and exercise.  Follow met b and a1c.       Relevant Medications   lisinopril (PRINIVIL,ZESTRIL) 20 MG tablet   pravastatin (PRAVACHOL) 20 MG tablet   Other Relevant Orders   Hemoglobin A1c   Hypercholesteremia    On pravastatin.  Low cholesterol diet and exercise.  Follow lipid panel and liver  function tests.        Relevant Medications   lisinopril (PRINIVIL,ZESTRIL) 20 MG tablet   metoprolol succinate (TOPROL-XL) 25 MG 24 hr tablet   pravastatin (PRAVACHOL) 20 MG tablet   Other Relevant Orders   Hepatic function panel   Lipid panel   Hypertension    Blood pressure elevated today.  Has been under good control at home. [place] with cardiology.  Hold on making changes in her medications.  Follow pressures.  Follow metabolic panel.        Relevant Medications   lisinopril (PRINIVIL,ZESTRIL) 20 MG tablet   metoprolol succinate (TOPROL-XL) 25 MG 24 hr tablet   pravastatin (PRAVACHOL) 20 MG tablet   Other Relevant Orders   CBC with Differential/Platelet   Basic metabolic panel   Hypothyroidism    On thyroid replacement.  Follow tsh.        Relevant Medications   levothyroxine (SYNTHROID) 75 MCG tablet   metoprolol succinate (TOPROL-XL) 25 MG 24 hr tablet       Einar Pheasant, MD

## 2015-12-28 NOTE — Progress Notes (Signed)
Pre visit review using our clinic review tool, if applicable. No additional management support is needed unless otherwise documented below in the visit note. 

## 2015-12-29 ENCOUNTER — Encounter: Payer: Self-pay | Admitting: Internal Medicine

## 2015-12-29 NOTE — Assessment & Plan Note (Signed)
Continue risk factor modification.  Followed by cardiology.  

## 2015-12-29 NOTE — Assessment & Plan Note (Signed)
On thyroid replacement.  Follow tsh.  

## 2015-12-29 NOTE — Assessment & Plan Note (Signed)
Followed by neurology.  Stable.  On aricept.

## 2015-12-29 NOTE — Assessment & Plan Note (Signed)
On pravastatin.  Low cholesterol diet and exercise.  Follow lipid panel and liver function tests.   

## 2015-12-29 NOTE — Assessment & Plan Note (Signed)
Blood pressure elevated today.  Has been under good control at home. [place] with cardiology.  Hold on making changes in her medications.  Follow pressures.  Follow metabolic panel.

## 2015-12-29 NOTE — Assessment & Plan Note (Signed)
Low carb diet and exercise.  Follow met b and a1c.  

## 2016-03-06 ENCOUNTER — Other Ambulatory Visit (INDEPENDENT_AMBULATORY_CARE_PROVIDER_SITE_OTHER): Payer: Medicare Other

## 2016-03-06 DIAGNOSIS — E119 Type 2 diabetes mellitus without complications: Secondary | ICD-10-CM

## 2016-03-06 DIAGNOSIS — I10 Essential (primary) hypertension: Secondary | ICD-10-CM

## 2016-03-06 DIAGNOSIS — E7800 Pure hypercholesterolemia, unspecified: Secondary | ICD-10-CM

## 2016-03-06 LAB — HEPATIC FUNCTION PANEL
ALBUMIN: 3.9 g/dL (ref 3.5–5.2)
ALK PHOS: 72 U/L (ref 39–117)
ALT: 7 U/L (ref 0–35)
AST: 14 U/L (ref 0–37)
BILIRUBIN TOTAL: 0.5 mg/dL (ref 0.2–1.2)
Bilirubin, Direct: 0.1 mg/dL (ref 0.0–0.3)
Total Protein: 6.6 g/dL (ref 6.0–8.3)

## 2016-03-06 LAB — BASIC METABOLIC PANEL
BUN: 14 mg/dL (ref 6–23)
CALCIUM: 9.5 mg/dL (ref 8.4–10.5)
CO2: 34 mEq/L — ABNORMAL HIGH (ref 19–32)
CREATININE: 0.97 mg/dL (ref 0.40–1.20)
Chloride: 107 mEq/L (ref 96–112)
GFR: 58.61 mL/min — AB (ref >60.00)
GLUCOSE: 112 mg/dL — AB (ref 70–99)
Potassium: 5 mEq/L (ref 3.5–5.1)
Sodium: 143 mEq/L (ref 135–145)

## 2016-03-06 LAB — CBC WITH DIFFERENTIAL/PLATELET
BASOS ABS: 0.1 10*3/uL (ref 0.0–0.1)
Basophils Relative: 1.3 % (ref 0.0–3.0)
Eosinophils Absolute: 0.2 10*3/uL (ref 0.0–0.7)
Eosinophils Relative: 2.8 % (ref 0.0–5.0)
HCT: 43.8 % (ref 36.0–46.0)
Hemoglobin: 14.4 g/dL (ref 12.0–15.0)
LYMPHS ABS: 1.6 10*3/uL (ref 0.7–4.0)
Lymphocytes Relative: 22.2 % (ref 12.0–46.0)
MCHC: 33 g/dL (ref 30.0–36.0)
MCV: 87.8 fl (ref 78.0–100.0)
MONOS PCT: 11.1 % (ref 3.0–12.0)
Monocytes Absolute: 0.8 10*3/uL (ref 0.1–1.0)
NEUTROS PCT: 62.6 % (ref 43.0–77.0)
Neutro Abs: 4.4 10*3/uL (ref 1.4–7.7)
Platelets: 278 10*3/uL (ref 150.0–400.0)
RBC: 4.99 Mil/uL (ref 3.87–5.11)
RDW: 14.3 % (ref 11.5–15.5)
WBC: 7.1 10*3/uL (ref 4.0–10.5)

## 2016-03-06 LAB — LIPID PANEL
CHOLESTEROL: 176 mg/dL (ref 0–200)
HDL: 45.6 mg/dL (ref >39.00)
LDL Cholesterol: 104 mg/dL — ABNORMAL HIGH (ref 0–99)
NonHDL: 130.54
Total CHOL/HDL Ratio: 4
Triglycerides: 134 mg/dL (ref 0.0–149.0)
VLDL: 26.8 mg/dL (ref 0.0–40.0)

## 2016-03-06 LAB — HEMOGLOBIN A1C: Hgb A1c MFr Bld: 6.4 % (ref 4.6–6.5)

## 2016-03-10 ENCOUNTER — Other Ambulatory Visit: Payer: Self-pay

## 2016-03-10 MED ORDER — PRAVASTATIN SODIUM 40 MG PO TABS: 40.0000 mg | ORAL_TABLET | Freq: Every day | ORAL | 1 refills | Status: DC

## 2016-05-07 ENCOUNTER — Encounter: Payer: Self-pay | Admitting: Internal Medicine

## 2016-05-07 ENCOUNTER — Ambulatory Visit (INDEPENDENT_AMBULATORY_CARE_PROVIDER_SITE_OTHER): Payer: Medicare Other | Admitting: Internal Medicine

## 2016-05-07 VITALS — BP 140/80 | HR 65 | Temp 97.6°F | Resp 12 | Ht 63.0 in | Wt 155.8 lb

## 2016-05-07 DIAGNOSIS — E7800 Pure hypercholesterolemia, unspecified: Secondary | ICD-10-CM

## 2016-05-07 DIAGNOSIS — F0390 Unspecified dementia without behavioral disturbance: Secondary | ICD-10-CM

## 2016-05-07 DIAGNOSIS — E119 Type 2 diabetes mellitus without complications: Secondary | ICD-10-CM | POA: Diagnosis not present

## 2016-05-07 DIAGNOSIS — I2510 Atherosclerotic heart disease of native coronary artery without angina pectoris: Secondary | ICD-10-CM | POA: Diagnosis not present

## 2016-05-07 DIAGNOSIS — Z23 Encounter for immunization: Secondary | ICD-10-CM

## 2016-05-07 DIAGNOSIS — I10 Essential (primary) hypertension: Secondary | ICD-10-CM | POA: Diagnosis not present

## 2016-05-07 DIAGNOSIS — Z1231 Encounter for screening mammogram for malignant neoplasm of breast: Secondary | ICD-10-CM | POA: Diagnosis not present

## 2016-05-07 DIAGNOSIS — Z1239 Encounter for other screening for malignant neoplasm of breast: Secondary | ICD-10-CM

## 2016-05-07 DIAGNOSIS — E039 Hypothyroidism, unspecified: Secondary | ICD-10-CM

## 2016-05-07 NOTE — Progress Notes (Signed)
Patient ID: Amanda [name], female   DOB: 12/26/1933, 83 y.o.   MRN: [id number]   Subjective:    Patient ID: Amanda [name], female    DOB: [birth year], 84 y.o.   MRN: [id number]  HPI  Patient here for a scheduled follow up.  She has known CAD.  Followed by Dr Amanda [name].  Stable.  Stays active. No chest pain.  No sob.  No acid reflux.  No abdominal pain.  Bowels moving.  Discussed recent labs.  On increased dose of pravastatin now.  Overall feels good.  Husband feels memory is overall stable.     Past Medical History:  Diagnosis Date  . Carotid bruit    bilaterally  . Dementia   . Hypercholesterolemia   . Hypertension   . Hypothyroidism    Past Surgical History:  Procedure Laterality Date  . CARPAL TUNNEL RELEASE    . CHOLECYSTECTOMY  1991  . THORACIC AORTA STENT  12/13   heart stent   Family History  Problem Relation Age of Onset  . Hypertension Father   . Lung cancer Father   . Hypertension Mother   . Dementia Mother   . Arthritis    . Breast cancer Cousin   . Colon cancer Neg Hx    Social History   Social History  . Marital status: Married    Spouse name: N/A  . Number of children: 2  . Years of education: N/A   Occupational History  . retired Pharmacist, hospital    Social History Main Topics  . Smoking status: Never Smoker  . Smokeless tobacco: Never Used  . Alcohol use No  . Drug use: No  . Sexual activity: Not Asked   Other Topics Concern  . None   Social History Narrative   She is an only child. 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    Outpatient Encounter Prescriptions as of 05/07/2016  Medication Sig  . aspirin 325 MG EC tablet Take 325 mg by mouth daily.  Marland Kitchen donepezil (ARICEPT) 10 MG tablet TAKE ONE TABLET AT BEDTIME.  Marland Kitchen levothyroxine (SYNTHROID) 75 MCG tablet Take 0.5 tablets (37.5 mcg total) by mouth daily.  Marland Kitchen lisinopril (PRINIVIL,ZESTRIL) 20 MG tablet Take 1 tablet (20 mg total) by mouth daily.  . metoprolol succinate  (TOPROL-XL) 25 MG 24 hr tablet TAKE ONE (1) TABLET BY MOUTH ONCE DAILY  . pravastatin (PRAVACHOL) 40 MG tablet Take 1 tablet (40 mg total) by mouth daily.   No facility-administered encounter medications on file as of 05/07/2016.     Review of Systems  Constitutional: Negative for appetite change and unexpected weight change.  HENT: Negative for congestion and sinus pressure.   Respiratory: Negative for cough, chest tightness and shortness of breath.   Cardiovascular: Negative for chest pain, palpitations and leg swelling.  Gastrointestinal: Negative for abdominal pain, diarrhea, nausea and vomiting.  Genitourinary: Negative for difficulty urinating and dysuria.  Musculoskeletal: Negative for back pain and joint swelling.  Skin: Negative for color change and rash.  Neurological: Negative for dizziness, light-headedness and headaches.  Psychiatric/Behavioral: Negative for agitation and dysphoric mood.       Objective:    Physical Exam  Constitutional: She appears well-developed and well-nourished. No distress.  HENT:  Nose: Nose normal.  Mouth/Throat: Oropharynx is clear and moist.  Neck: Neck supple. No thyromegaly present.  Cardiovascular: Normal rate and regular rhythm.   Pulmonary/Chest: Breath sounds normal. No respiratory distress. She has no  wheezes.  Abdominal: Soft. Bowel sounds are normal. There is no tenderness.  Musculoskeletal: She exhibits no edema or tenderness.  Lymphadenopathy:    She has no cervical adenopathy.  Skin: No rash noted. No erythema.  Psychiatric: She has a normal mood and affect. Her behavior is normal.    BP 140/80 (BP Location: Left Arm, Patient Position: Sitting, Cuff Size: Normal)   Pulse 65   Temp 97.6 F (36.4 C) (Oral)   Resp 12   Ht 5' 3" (1.6 m)   Wt 155 lb 12.8 oz (70.7 kg)   SpO2 96%   BMI 27.60 kg/m  Wt Readings from Last 3 Encounters:  05/07/16 155 lb 12.8 oz (70.7 kg)  12/28/15 149 lb 9.6 oz (67.9 kg)  08/28/15 145 lb (65.8  kg)     Lab Results  Component Value Date   WBC 7.1 03/06/2016   HGB 14.4 03/06/2016   HCT 43.8 03/06/2016   PLT 278.0 03/06/2016   GLUCOSE 112 (H) 03/06/2016   CHOL 176 03/06/2016   TRIG 134.0 03/06/2016   HDL 45.60 03/06/2016   LDLCALC 104 (H) 03/06/2016   ALT 7 03/06/2016   AST 14 03/06/2016   NA 143 03/06/2016   K 5.0 03/06/2016   CL 107 03/06/2016   CREATININE 0.97 03/06/2016   BUN 14 03/06/2016   CO2 34 (H) 03/06/2016   TSH 2.13 08/23/2015   HGBA1C 6.4 03/06/2016   MICROALBUR 1.8 08/21/2014    Mm Screening Breast Tomo Bilateral  Result Date: 09/03/2015 CLINICAL DATA:  Screening. EXAM: 2D DIGITAL SCREENING BILATERAL MAMMOGRAM WITH CAD AND ADJUNCT TOMO COMPARISON:  Previous exam(s). ACR Breast Density Category b: There are scattered areas of fibroglandular density. FINDINGS: There are no findings suspicious for malignancy. Images were processed with CAD. IMPRESSION: No mammographic evidence of malignancy. A result letter of this screening mammogram will be mailed directly to the patient. RECOMMENDATION: Screening mammogram in one year. (Code:SM-B-01Y) BI-RADS CATEGORY  1: Negative. Electronically Signed   By: Franki Cabot M.D.   On: 09/03/2015 11:17       Assessment & Plan:   Problem List Items Addressed This Visit    CAD (coronary artery disease)    Continue risk factor modification.  Stable.  Followed by cardiology.       Dementia    Followed by neurology.  Stable.  On aricept.        Diabetes ([place])    Low carb diet and exercise.  Follow met b and a1c.        Relevant Orders   Hemoglobin A1c   Microalbumin / creatinine urine ratio   Hypercholesteremia    Low cholesterol diet and exercise.  On pravastatin.  Follow lipid panel and liver function tests.        Relevant Orders   Lipid panel   Hepatic function panel   Hypertension    Blood pressure as outlined.  Same medication regimen.  Follow pressures.  Have him spot check her pressure.  Notify me if  elevation.  Follow metabolic panel.        Relevant Orders   Basic metabolic panel   Hypothyroidism    On thyroid replacement.  Follow tsh.       Relevant Orders   TSH    Other Visit Diagnoses    Screening for breast cancer    -  Primary   Relevant Orders   MM DIGITAL SCREENING BILATERAL   Need for pneumococcal vaccination  Einar Pheasant, MD

## 2016-05-07 NOTE — Progress Notes (Signed)
Pre-visit discussion using our clinic review tool. No additional management support is needed unless otherwise documented below in the visit note.  

## 2016-05-11 ENCOUNTER — Encounter: Payer: Self-pay | Admitting: Internal Medicine

## 2016-05-11 NOTE — Assessment & Plan Note (Signed)
On thyroid replacement.  Follow tsh.  

## 2016-05-11 NOTE — Assessment & Plan Note (Signed)
Followed by neurology.  Stable.  On aricept.   

## 2016-05-11 NOTE — Assessment & Plan Note (Signed)
Continue risk factor modification.  Stable.  Followed by cardiology.  

## 2016-05-11 NOTE — Assessment & Plan Note (Signed)
Low cholesterol diet and exercise.  On pravastatin.  Follow lipid panel and liver function tests.   

## 2016-05-11 NOTE — Assessment & Plan Note (Signed)
Blood pressure as outlined.  Same medication regimen.  Follow pressures.  Have him spot check her pressure.  Notify me if elevation.  Follow metabolic panel.

## 2016-05-11 NOTE — Assessment & Plan Note (Signed)
Low carb diet and exercise.  Follow met b and a1c.   

## 2016-05-24 ENCOUNTER — Ambulatory Visit
Admission: EM | Admit: 2016-05-24 | Discharge: 2016-05-24 | Disposition: A | Discharge status: Home / Self Care | Payer: Medicare Other | Attending: Family Medicine | Admitting: Family Medicine

## 2016-05-24 DIAGNOSIS — L03116 Cellulitis of left lower limb: Secondary | ICD-10-CM

## 2016-05-24 MED ORDER — DOXYCYCLINE HYCLATE 100 MG PO CAPS: 100.0000 mg | ORAL_CAPSULE | Freq: Two times a day (BID) | ORAL | 0 refills | Status: DC

## 2016-05-24 NOTE — ED Triage Notes (Signed)
Patient complains of insect bite on her left calf. Patient states that this occurred one week ago. Patient states that area has been worsening and she drew a ring around the redness last night and it has gone outside of the ring.

## 2016-05-24 NOTE — Discharge Instructions (Signed)
Antibiotic twice daily with food.  Avoid direct sunlight.  If it worsens, go to ER.  Take care  Dr. Adriana Simas

## 2016-05-24 NOTE — ED Provider Notes (Signed)
[facility]    CSN: [id number] Arrival date & time: 05/24/16  4098  History   Chief Complaint Chief Complaint  Patient presents with  . Insect Bite   HPI  83 year old female with dementia and prediabetes presents with the above complaint.  Patient states that she noticed an area of concern on her left lower leg, middle calf approximately 1 week ago. She is not sure whether she suffered an insect bite or she injured it some other way. She states that over the past week the redness has been worsening and has started to spread. No associated fevers or chills. No nausea or vomiting. She otherwise feels well. No medications or interventions tried. No other associated symptoms. No other complaints at this time.   Past Medical History:  Diagnosis Date  . Carotid bruit    bilaterally  . Dementia   . Hypercholesterolemia   . Hypertension   . Hypothyroidism    Patient Active Problem List   Diagnosis Date Noted  . Elevated serum creatinine 09/02/2015  . Fall 12/31/2014  . Health care maintenance 05/20/2014  . Prediabetes 05/17/2013  . CAD (coronary artery disease) 05/15/2012  . Osteopenia 12/14/2011  . Hypertension 11/13/2011  . Hypercholesteremia 11/13/2011  . Dementia 11/13/2011  . Hypothyroidism 11/13/2011   Past Surgical History:  Procedure Laterality Date  . CARPAL TUNNEL RELEASE    . CHOLECYSTECTOMY  1991  . THORACIC AORTA STENT  12/13   heart stent   OB History    No data available     Home Medications    Prior to Admission medications   Medication Sig Start Date End Date Taking? Authorizing Provider  aspirin 325 MG EC tablet Take 325 mg by mouth daily.   Yes Historical Provider, MD  donepezil (ARICEPT) 10 MG tablet TAKE ONE TABLET AT BEDTIME. 12/28/15  Yes Dale Fosston, MD  levothyroxine (SYNTHROID) 75 MCG tablet Take 0.5 tablets (37.5 mcg total) by mouth daily. 12/28/15  Yes Dale [place], MD  lisinopril (PRINIVIL,ZESTRIL) 20 MG tablet Take 1  tablet (20 mg total) by mouth daily. 12/28/15  Yes Dale Eschbach, MD  metoprolol succinate (TOPROL-XL) 25 MG 24 hr tablet TAKE ONE (1) TABLET BY MOUTH ONCE DAILY 12/28/15  Yes Dale [place], MD  pravastatin (PRAVACHOL) 40 MG tablet Take 1 tablet (40 mg total) by mouth daily. 03/10/16  Yes Dale [place], MD  doxycycline (VIBRAMYCIN) 100 MG capsule Take 1 capsule (100 mg total) by mouth 2 (two) times daily. 05/24/16   Tommie Sams, DO    Family History Family History  Problem Relation Age of Onset  . Hypertension Father   . Lung cancer Father   . Hypertension Mother   . Dementia Mother   . Arthritis    . Breast cancer Cousin   . Colon cancer Neg Hx     Social History Social History  Substance Use Topics  . Smoking status: Never Smoker  . Smokeless tobacco: Never Used  . Alcohol use No     Allergies   Sulfa antibiotics   Review of Systems Review of Systems  Skin:       Papule and redness.   All other systems reviewed and are negative.  Physical Exam Triage Vital Signs ED Triage Vitals  Enc Vitals Group     BP 05/24/16 0848 (!) 142/76     Pulse Rate 05/24/16 0848 80     Resp 05/24/16 0848 17     Temp 05/24/16 0848 97.6 F (36.4 C)  Temp Source 05/24/16 0848 Oral     SpO2 05/24/16 0848 100 %     Weight 05/24/16 0847 155 lb (70.3 kg)     Height 05/24/16 0847  (1.6 m)     Head Circumference --      Peak Flow --      Pain Score 05/24/16 0847 0     Pain Loc --      Pain Edu? --      Excl. in GC? --     Updated Vital Signs BP (!) 142/76 (BP Location: Left Arm)   Pulse 80   Temp 97.6 F (36.4 C) (Oral)   Resp 17   Ht  (1.6 m)   Wt 155 lb (70.3 kg)   SpO2 100%   BMI 27.46 kg/m   Physical Exam  Constitutional: She appears well-developed. No distress.  HENT:  Head: Normocephalic and atraumatic.  Eyes: Conjunctivae are normal.  Neck: Normal range of motion.  Cardiovascular: Normal rate and regular rhythm.   Pulmonary/Chest: Effort normal and  breath sounds normal.  Abdominal: Soft. She exhibits no distension. There is no tenderness.  Musculoskeletal: Normal range of motion.  Neurological: She is alert.  Skin:  Left lower leg -middle calf with a 5 mm papule with surrounding erythema which extends out approximately 8 cm. Palpable warmth. She has some induration. No fluctuance. No drainage.   Psychiatric: She has a normal mood and affect.  Vitals reviewed.  UC Treatments / Results  Labs (all labs ordered are listed, but only abnormal results are displayed) Labs Reviewed - No data to display  EKG  EKG Interpretation None       Radiology No results found.  Procedures Procedures (including critical care time)  Medications Ordered in UC Medications - No data to display   Initial Impression / Assessment and Plan / UC Course  I have reviewed the triage vital signs and the nursing notes.  Pertinent labs & imaging results that were available during my care of the patient were reviewed by me and considered in my medical decision making (see chart for details).   83 year old female presents with a possible insect bite. There appears to be significant cellulitis. Treating with doxycycline.  Final Clinical Impressions(s) / UC Diagnoses   Final diagnoses:  Cellulitis of left lower extremity   New Prescriptions New Prescriptions   DOXYCYCLINE (VIBRAMYCIN) 100 MG CAPSULE    Take 1 capsule (100 mg total) by mouth 2 (two) times daily.     Tommie Sams, DO 05/24/16 [phone]

## 2016-05-28 ENCOUNTER — Ambulatory Visit (INDEPENDENT_AMBULATORY_CARE_PROVIDER_SITE_OTHER): Payer: Medicare Other

## 2016-05-28 VITALS — BP 138/76 | HR 64 | Temp 98.4°F | Resp 12 | Ht 63.0 in | Wt 154.0 lb

## 2016-05-28 DIAGNOSIS — Z0000 Encounter for general adult medical examination without abnormal findings: Secondary | ICD-10-CM | POA: Diagnosis not present

## 2016-05-28 NOTE — Progress Notes (Signed)
Subjective:   Amanda [name] is a 83 y.o. female who presents for Medicare Annual (Subsequent) preventive examination.  Review of Systems:  No ROS.  Medicare Wellness Visit.  Cardiac Risk Factors include: advanced age (>75men, >6 women);hypertension     Objective:     Vitals: BP 138/76 (BP Location: Left Arm, Patient Position: Sitting, Cuff Size: Normal)   Pulse 64   Temp 98.4 F (36.9 C) (Oral)   Resp 12   Ht  (1.6 m)   Wt 154 lb (69.9 kg)   SpO2 97%   BMI 27.28 kg/m   Body mass index is 27.28 kg/m.   Tobacco History  Smoking Status  . Never Smoker  Smokeless Tobacco  . Never Used     Counseling given: Not Answered   Past Medical History:  Diagnosis Date  . Carotid bruit    bilaterally  . Dementia   . Hypercholesterolemia   . Hypertension   . Hypothyroidism    Past Surgical History:  Procedure Laterality Date  . CARPAL TUNNEL RELEASE    . CHOLECYSTECTOMY  1991  . THORACIC AORTA STENT  12/13   heart stent   Family History  Problem Relation Age of Onset  . Hypertension Father   . Lung cancer Father   . Hypertension Mother   . Dementia Mother   . Arthritis    . Breast cancer Cousin   . Colon cancer Neg Hx    History  Sexual Activity  . Sexual activity: Not on file    Outpatient Encounter Prescriptions as of 05/28/2016  Medication Sig  . aspirin 325 MG EC tablet Take 325 mg by mouth daily.  Marland Kitchen donepezil (ARICEPT) 10 MG tablet TAKE ONE TABLET AT BEDTIME.  Marland Kitchen doxycycline (VIBRAMYCIN) 100 MG capsule Take 1 capsule (100 mg total) by mouth 2 (two) times daily.  Marland Kitchen levothyroxine (SYNTHROID) 75 MCG tablet Take 0.5 tablets (37.5 mcg total) by mouth daily.  Marland Kitchen lisinopril (PRINIVIL,ZESTRIL) 20 MG tablet Take 1 tablet (20 mg total) by mouth daily.  . metoprolol succinate (TOPROL-XL) 25 MG 24 hr tablet TAKE ONE (1) TABLET BY MOUTH ONCE DAILY  . pravastatin (PRAVACHOL) 40 MG tablet Take 1 tablet (40 mg total) by mouth daily.   No facility-administered  encounter medications on file as of 05/28/2016.     Activities of Daily Living In your present state of health, do you have any difficulty performing the following activities: 05/28/2016  Hearing? N  Vision? N  Difficulty concentrating or making decisions? Y  Walking or climbing stairs? N  Dressing or bathing? N  Doing errands, shopping? N  Preparing Food and eating ? Y  Using the Toilet? N  In the past six months, have you accidently leaked urine? N  Do you have problems with loss of bowel control? N  Managing your Medications? Y  Managing your Finances? Y  Housekeeping or managing your Housekeeping? Y  Some recent data might be hidden    Patient Care Team: Dale [place], MD as PCP - General (Internal Medicine)    Assessment:    This is a routine wellness examination for Amanda [name]. The goal of the wellness visit is to assist the patient how to close the gaps in care and create a preventative care plan for the patient.Husband presents at appointment, HIPAA compliant.  Osteoporosis risk reviewed.  Medications reviewed; taking without issues or barriers.  Safety issues reviewed; smoke detectors in the home. No firearms in the home.  Wears seatbelts when  driving or riding with others. Patient does wear sunscreen or protective clothing when in direct sunlight. No violence in the home.  Depression- PHQ 2 &9 complete.  No signs/symptoms or verbal communication regarding little pleasure in doing things, feeling down, depressed or hopeless. No changes in sleeping, energy, eating, concentrating.  No thoughts of self harm or harm towards others.  Time spent on this topic is 8 minutes.   Patient is alert, normal appearance, oriented to person/place/and time.   Patient displays appropriate judgement and can read correct time from watch face.  No new identified risk were noted.  No failures at ADL's or IADL's. Husband assists as needed.  BMI- discussed the importance of a healthy diet, water  intake and exercise. Educational material provided.   HTN- followed by PCP.  Dental- every six months.  Eye- Visual acuity not assessed per patient preference since they have regular follow up with the ophthalmologist.  Wears corrective lenses.  Sleep patterns- Sleeps 8 hours at night.  Wakes feeling rested.  TDAP vaccine deferred per patient preference.  Follow up with insurance.  Educational material provided.  Dexa Scan discussed. Declined per patient preference.  Patient Concerns: None at this time. Follow up with PCP as needed.  Exercise Activities and Dietary recommendations Current Exercise Habits: Home exercise routine, Type of exercise: walking (Yard work), Time (Minutes): 20, Frequency (Times/Week): 4, Weekly Exercise (Minutes/Week): 80, Intensity: Mild  Goals    . Healthy Lifestyle          Stay active Stay hydrated Low carb foods Continue cross word puzzles/reading      Fall Risk Fall Risk  05/28/2016 12/28/2015 12/27/2014 03/16/2014 05/17/2013  Falls in the past year? No No Yes No No  Number falls in past yr: - - 1 - -  Injury with Fall? - - No - -   Depression Screen PHQ 2/9 Scores 05/28/2016 12/28/2015 12/27/2014 03/16/2014  PHQ - 2 Score 0 0 0 0  PHQ- 9 Score 0 - - -     Cognitive Function  Dx of Dementia on file.   6CIT Screen 05/28/2016  What Year? 4 points  What month? 3 points  What time? 0 points  Count back from 20 0 points  Months in reverse 0 points  Repeat phrase 10 points  Total Score 17    Immunization History  Administered Date(s) Administered  . Influenza,inj,Quad PF,36+ Mos 11/12/2012, 09/21/2013, 09/26/2014  . Influenza-Unspecified 11/28/2015  . Pneumococcal Conjugate-13 05/17/2013  . Pneumococcal Polysaccharide-23 05/07/2016   Screening Tests Health Maintenance  Topic Date Due  . FOOT EXAM  05/09/1945  . OPHTHALMOLOGY EXAM  05/09/1945  . TETANUS/TDAP  05/10/1954  . DEXA SCAN  05/09/2000  . INFLUENZA VACCINE  08/27/2016  .  MAMMOGRAM  09/02/2016  . HEMOGLOBIN A1C  09/03/2016  . PNA vac Low Risk Adult  Completed      Plan:    End of life planning; Advance aging; Advanced directives discussed. Copy of current HCPOA/Living Will requested.    I have personally reviewed and noted the following in the patient's chart:   . Medical and social history . Use of alcohol, tobacco or illicit drugs  . Current medications and supplements . Functional ability and status . Nutritional status . Physical activity . Advanced directives . List of other physicians . Hospitalizations, surgeries, and ER visits in previous 12 months . Vitals . Screenings to include cognitive, depression, and falls . Referrals and appointments  In addition, I have reviewed and discussed  with patient certain preventive protocols, quality metrics, and best practice recommendations. A written personalized care plan for preventive services as well as general preventive health recommendations were provided to patient.     Ashok Pall, LPN  02/01/1094

## 2016-05-28 NOTE — Patient Instructions (Addendum)
  Amanda [name] , Thank you for taking time to come for your Medicare Wellness Visit. I appreciate your ongoing commitment to your health goals. Please review the following plan we discussed and let me know if I can assist you in the future.   Follow up with Dr. Lorin Picket as needed.    Bring a copy of your Health Care Power of Attorney and/or Living Will to be scanned into chart.  Have a great day! These are the goals we discussed: Goals    . Healthy Lifestyle          Stay active Stay hydrated Low carb foods Continue cross word puzzles/reading       This is a list of the screening recommended for you and due dates:  Health Maintenance  Topic Date Due  . Complete foot exam   05/09/1945  . Eye exam for diabetics  05/09/1945  . Tetanus Vaccine  05/10/1954  . DEXA scan (bone density measurement)  05/09/2000  . Flu Shot  08/27/2016  . Mammogram  09/02/2016  . Hemoglobin A1C  09/03/2016  . Pneumonia vaccines  Completed

## 2016-05-29 NOTE — Progress Notes (Signed)
  I have reviewed the above information and agree with above.   Ceola Para, MD 

## 2016-05-30 ENCOUNTER — Ambulatory Visit (INDEPENDENT_AMBULATORY_CARE_PROVIDER_SITE_OTHER): Payer: Medicare Other | Admitting: Internal Medicine

## 2016-05-30 ENCOUNTER — Encounter: Payer: Self-pay | Admitting: Internal Medicine

## 2016-05-30 VITALS — BP 128/64 | HR 61 | Temp 97.9°F | Resp 14 | Wt 154.1 lb

## 2016-05-30 DIAGNOSIS — I10 Essential (primary) hypertension: Secondary | ICD-10-CM | POA: Diagnosis not present

## 2016-05-30 DIAGNOSIS — L03119 Cellulitis of unspecified part of limb: Secondary | ICD-10-CM | POA: Diagnosis not present

## 2016-05-30 NOTE — Progress Notes (Signed)
Pre visit review using our clinic review tool, if applicable. No additional management support is needed unless otherwise documented below in the visit note. 

## 2016-05-30 NOTE — Progress Notes (Signed)
Patient ID: Amanda [name], female   DOB: [birth year], 83 y.o.   MRN: [id number]   Subjective:    Patient ID: Amanda [name], female    DOB: 1933-05-30, 83 y.o.   MRN: [id number]  HPI  Patient here as a work in with concerns regarding f/u for cellulitis of left lower leg.  She saw Dr Adriana Simas on 05/24/16.  Note reviewed.  Placed on doxycycline.  She is accompanied by her husband.  History obtained from both of them.  Leg improved.  No surrounding redness.  No pain.  Eating and drinking well.  No nausea or vomiting.  Bowels moving.     Past Medical History:  Diagnosis Date  . Carotid bruit    bilaterally  . Dementia   . Hypercholesterolemia   . Hypertension   . Hypothyroidism    Past Surgical History:  Procedure Laterality Date  . CARPAL TUNNEL RELEASE    . CHOLECYSTECTOMY  1991  . THORACIC AORTA STENT  12/13   heart stent   Family History  Problem Relation Age of Onset  . Hypertension Father   . Lung cancer Father   . Hypertension Mother   . Dementia Mother   . Arthritis Unknown   . Breast cancer Cousin   . Colon cancer Neg Hx    Social History   Social History  . Marital status: Married    Spouse name: N/A  . Number of children: 2  . Years of education: N/A   Occupational History  . retired Runner, broadcasting/film/video    Social History Main Topics  . Smoking status: Never Smoker  . Smokeless tobacco: Never Used  . Alcohol use No  . Drug use: No  . Sexual activity: Not Asked   Other Topics Concern  . None   Social History Narrative   She is an only child. 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    Outpatient Encounter Prescriptions as of 05/30/2016  Medication Sig  . aspirin 325 MG EC tablet Take 325 mg by mouth daily.  Marland Kitchen donepezil (ARICEPT) 10 MG tablet TAKE ONE TABLET AT BEDTIME.  Marland Kitchen doxycycline (VIBRAMYCIN) 100 MG capsule Take 1 capsule (100 mg total) by mouth 2 (two) times daily.  Marland Kitchen levothyroxine (SYNTHROID) 75 MCG tablet Take 0.5 tablets (37.5 mcg  total) by mouth daily.  Marland Kitchen lisinopril (PRINIVIL,ZESTRIL) 20 MG tablet Take 1 tablet (20 mg total) by mouth daily.  . metoprolol succinate (TOPROL-XL) 25 MG 24 hr tablet TAKE ONE (1) TABLET BY MOUTH ONCE DAILY  . pravastatin (PRAVACHOL) 40 MG tablet Take 1 tablet (40 mg total) by mouth daily.   No facility-administered encounter medications on file as of 05/30/2016.     Review of Systems  Constitutional: Negative for appetite change, fever and unexpected weight change.  Cardiovascular: Negative for leg swelling.  Gastrointestinal: Negative for diarrhea, nausea and vomiting.  Musculoskeletal: Negative for joint swelling and myalgias.  Skin: Negative for color change and rash.       Objective:    Physical Exam  Constitutional: She appears well-developed and well-nourished. No distress.  HENT:  Nose: Nose normal.  Mouth/Throat: Oropharynx is clear and moist.  Cardiovascular: Normal rate and regular rhythm.   Pulmonary/Chest: Breath sounds normal. No respiratory distress. She has no wheezes.  Musculoskeletal: She exhibits no edema or tenderness.  Small lesion - lower leg.  No surrounding erythema.  No pain.  No swelling.    Skin: No rash noted. No  erythema.    BP 128/64 (BP Location: Left Arm, Patient Position: Sitting, Cuff Size: Normal)   Pulse 61   Temp 97.9 F (36.6 C) (Oral)   Resp 14   Wt 154 lb 2 oz (69.9 kg)   SpO2 98%   BMI 27.30 kg/m  Wt Readings from Last 3 Encounters:  05/30/16 154 lb 2 oz (69.9 kg)  05/28/16 154 lb (69.9 kg)  05/24/16 155 lb (70.3 kg)     Lab Results  Component Value Date   WBC 7.1 03/06/2016   HGB 14.4 03/06/2016   HCT 43.8 03/06/2016   PLT 278.0 03/06/2016   GLUCOSE 112 (H) 03/06/2016   CHOL 176 03/06/2016   TRIG 134.0 03/06/2016   HDL 45.60 03/06/2016   LDLCALC 104 (H) 03/06/2016   ALT 7 03/06/2016   AST 14 03/06/2016   NA 143 03/06/2016   K 5.0 03/06/2016   CL 107 03/06/2016   CREATININE 0.97 03/06/2016   BUN 14 03/06/2016    CO2 34 (H) 03/06/2016   TSH 2.13 08/23/2015   HGBA1C 6.4 03/06/2016   MICROALBUR 1.8 08/21/2014       Assessment & Plan:   Problem List Items Addressed This Visit    Hypertension    Blood pressure under good control.  Continue same medication regimen.  Follow pressures.  Follow metabolic panel.         Other Visit Diagnoses    Cellulitis of lower extremity, unspecified laterality    -  Primary   improved/resolved.   instructed to complete abx.  no swelling.  no erythema.  notify me if any worsening.         Dale DurhamSCOTT, Faith Patricelli, MD

## 2016-05-30 NOTE — Patient Instructions (Signed)
Complete the antibiotic.    Examples of probiotics are:  florastor, align or culturelle.

## 2016-06-08 ENCOUNTER — Encounter: Payer: Self-pay | Admitting: Internal Medicine

## 2016-06-08 NOTE — Assessment & Plan Note (Signed)
Blood pressure under good control.  Continue same medication regimen.  Follow pressures.  Follow metabolic panel.   

## 2016-07-03 ENCOUNTER — Other Ambulatory Visit: Payer: Self-pay

## 2016-07-03 MED ORDER — PRAVASTATIN SODIUM 40 MG PO TABS: 40.0000 mg | ORAL_TABLET | Freq: Every day | ORAL | 1 refills | Status: DC

## 2016-09-04 ENCOUNTER — Ambulatory Visit: Admission: RE | Admit: 2016-09-04 | Payer: Medicare Other | Source: Ambulatory Visit

## 2016-09-08 ENCOUNTER — Other Ambulatory Visit: Payer: Medicare Other

## 2016-09-09 ENCOUNTER — Ambulatory Visit: Payer: Medicare Other | Admitting: Internal Medicine

## 2016-09-17 ENCOUNTER — Ambulatory Visit
Admission: RE | Admit: 2016-09-17 | Discharge: 2016-09-17 | Disposition: A | Discharge status: Home / Self Care | Payer: Medicare Other | Source: Ambulatory Visit | Attending: Internal Medicine | Admitting: Internal Medicine

## 2016-09-17 DIAGNOSIS — Z1231 Encounter for screening mammogram for malignant neoplasm of breast: Secondary | ICD-10-CM | POA: Insufficient documentation

## 2016-09-17 DIAGNOSIS — Z1239 Encounter for other screening for malignant neoplasm of breast: Secondary | ICD-10-CM

## 2016-09-23 ENCOUNTER — Other Ambulatory Visit: Payer: Self-pay

## 2016-09-23 MED ORDER — PRAVASTATIN SODIUM 40 MG PO TABS: 40.0000 mg | ORAL_TABLET | Freq: Every day | ORAL | 2 refills | Status: DC

## 2016-10-24 ENCOUNTER — Other Ambulatory Visit (INDEPENDENT_AMBULATORY_CARE_PROVIDER_SITE_OTHER): Payer: Medicare Other

## 2016-10-24 DIAGNOSIS — E7800 Pure hypercholesterolemia, unspecified: Secondary | ICD-10-CM

## 2016-10-24 DIAGNOSIS — E039 Hypothyroidism, unspecified: Secondary | ICD-10-CM

## 2016-10-24 DIAGNOSIS — E119 Type 2 diabetes mellitus without complications: Secondary | ICD-10-CM | POA: Diagnosis not present

## 2016-10-24 DIAGNOSIS — I10 Essential (primary) hypertension: Secondary | ICD-10-CM

## 2016-10-24 LAB — BASIC METABOLIC PANEL
BUN: 12 mg/dL (ref 6–23)
CALCIUM: 9.8 mg/dL (ref 8.4–10.5)
CO2: 31 meq/L (ref 19–32)
Chloride: 102 mEq/L (ref 96–112)
Creatinine, Ser: 0.94 mg/dL (ref 0.40–1.20)
GFR: 60.67 mL/min (ref >60.00)
Glucose, Bld: 112 mg/dL — ABNORMAL HIGH (ref 70–99)
Potassium: 4.6 mEq/L (ref 3.5–5.1)
SODIUM: 138 meq/L (ref 135–145)

## 2016-10-24 LAB — HEPATIC FUNCTION PANEL
ALT: 8 U/L (ref 0–35)
AST: 14 U/L (ref 0–37)
Albumin: 3.9 g/dL (ref 3.5–5.2)
Alkaline Phosphatase: 65 U/L (ref 39–117)
BILIRUBIN DIRECT: 0.1 mg/dL (ref 0.0–0.3)
BILIRUBIN TOTAL: 0.5 mg/dL (ref 0.2–1.2)
Total Protein: 7 g/dL (ref 6.0–8.3)

## 2016-10-24 LAB — LIPID PANEL
CHOLESTEROL: 176 mg/dL (ref 0–200)
HDL: 41.6 mg/dL (ref >39.00)
LDL CALC: 111 mg/dL — AB (ref 0–99)
NONHDL: 134.56
Total CHOL/HDL Ratio: 4
Triglycerides: 116 mg/dL (ref 0.0–149.0)
VLDL: 23.2 mg/dL (ref 0.0–40.0)

## 2016-10-24 LAB — TSH: TSH: 1.86 u[IU]/mL (ref 0.35–4.50)

## 2016-10-24 LAB — HEMOGLOBIN A1C: HEMOGLOBIN A1C: 6.3 % (ref 4.6–6.5)

## 2016-10-24 LAB — MICROALBUMIN / CREATININE URINE RATIO
CREATININE, U: 451.5 mg/dL
MICROALB/CREAT RATIO: 1 mg/g (ref 0.0–30.0)
Microalb, Ur: 4.5 mg/dL — ABNORMAL HIGH (ref 0.0–1.9)

## 2016-10-27 ENCOUNTER — Encounter: Payer: Self-pay | Admitting: Internal Medicine

## 2016-10-27 ENCOUNTER — Ambulatory Visit (INDEPENDENT_AMBULATORY_CARE_PROVIDER_SITE_OTHER): Payer: Medicare Other | Admitting: Internal Medicine

## 2016-10-27 DIAGNOSIS — I10 Essential (primary) hypertension: Secondary | ICD-10-CM | POA: Diagnosis not present

## 2016-10-27 DIAGNOSIS — E039 Hypothyroidism, unspecified: Secondary | ICD-10-CM | POA: Diagnosis not present

## 2016-10-27 DIAGNOSIS — I2510 Atherosclerotic heart disease of native coronary artery without angina pectoris: Secondary | ICD-10-CM | POA: Diagnosis not present

## 2016-10-27 DIAGNOSIS — F0390 Unspecified dementia without behavioral disturbance: Secondary | ICD-10-CM

## 2016-10-27 DIAGNOSIS — Z23 Encounter for immunization: Secondary | ICD-10-CM | POA: Diagnosis not present

## 2016-10-27 DIAGNOSIS — R739 Hyperglycemia, unspecified: Secondary | ICD-10-CM | POA: Diagnosis not present

## 2016-10-27 DIAGNOSIS — E7800 Pure hypercholesterolemia, unspecified: Secondary | ICD-10-CM

## 2016-10-27 MED ORDER — METOPROLOL SUCCINATE ER 25 MG PO TB24: ORAL_TABLET | ORAL | 3 refills | Status: DC

## 2016-10-27 MED ORDER — LISINOPRIL 20 MG PO TABS: 20.0000 mg | ORAL_TABLET | Freq: Every day | ORAL | 3 refills | Status: DC

## 2016-10-27 MED ORDER — PRAVASTATIN SODIUM 40 MG PO TABS: 40.0000 mg | ORAL_TABLET | Freq: Every day | ORAL | 3 refills | Status: DC

## 2016-10-27 MED ORDER — LEVOTHYROXINE SODIUM 75 MCG PO TABS: 37.5000 ug | ORAL_TABLET | Freq: Every day | ORAL | 3 refills | Status: DC

## 2016-10-27 MED ORDER — DONEPEZIL HCL 10 MG PO TABS: ORAL_TABLET | ORAL | 3 refills | Status: DC

## 2016-10-27 NOTE — Progress Notes (Signed)
Patient ID: Amanda [name], female   DOB: [birth year], 83 y.o.   MRN: [id number]   Subjective:    Patient ID: Amanda [name], female    DOB: 06-Oct-1933, 83 y.o.   MRN: [id number]  HPI  Patient here for a scheduled follow up.  Cardiology evaluated 10/15/16.  Felt things were stable.  No changes made.  She is accompanied by her husband.  History obtained from both of them.  Overall they feel she is doing well.  No chest pain.  No sob.  No acid reflux.  No abdominal pain.  Bowels moving.  Still having issues with her memory.  Husband does not feel needs anything more at this time.  Husband to start chemo soon.     Past Medical History:  Diagnosis Date  . Carotid bruit    bilaterally  . Dementia   . Hypercholesterolemia   . Hypertension   . Hypothyroidism    Past Surgical History:  Procedure Laterality Date  . CARPAL TUNNEL RELEASE    . CHOLECYSTECTOMY  1991  . THORACIC AORTA STENT  12/13   heart stent   Family History  Problem Relation Age of Onset  . Hypertension Father   . Lung cancer Father   . Hypertension Mother   . Dementia Mother   . Arthritis Unknown   . Breast cancer Cousin   . Colon cancer Neg Hx    Social History   Social History  . Marital status: Married    Spouse name: N/A  . Number of children: 2  . Years of education: N/A   Occupational History  . retired Pharmacist, hospital    Social History Main Topics  . Smoking status: Never Smoker  . Smokeless tobacco: Never Used  . Alcohol use No  . Drug use: No  . Sexual activity: Not Asked   Other Topics Concern  . None   Social History Narrative   She is an only child. 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    Outpatient Encounter Prescriptions as of 10/27/2016  Medication Sig  . aspirin 325 MG EC tablet Take 325 mg by mouth daily.  Marland Kitchen donepezil (ARICEPT) 10 MG tablet TAKE ONE TABLET AT BEDTIME.  Marland Kitchen doxycycline (VIBRAMYCIN) 100 MG capsule Take 1 capsule (100 mg total) by mouth 2 (two)  times daily.  Marland Kitchen levothyroxine (SYNTHROID) 75 MCG tablet Take 0.5 tablets (37.5 mcg total) by mouth daily.  Marland Kitchen lisinopril (PRINIVIL,ZESTRIL) 20 MG tablet Take 1 tablet (20 mg total) by mouth daily.  . metoprolol succinate (TOPROL-XL) 25 MG 24 hr tablet TAKE ONE (1) TABLET BY MOUTH ONCE DAILY  . pravastatin (PRAVACHOL) 40 MG tablet Take 1 tablet (40 mg total) by mouth daily.  . [DISCONTINUED] donepezil (ARICEPT) 10 MG tablet TAKE ONE TABLET AT BEDTIME.  . [DISCONTINUED] levothyroxine (SYNTHROID) 75 MCG tablet Take 0.5 tablets (37.5 mcg total) by mouth daily.  . [DISCONTINUED] lisinopril (PRINIVIL,ZESTRIL) 20 MG tablet Take 1 tablet (20 mg total) by mouth daily.  . [DISCONTINUED] metoprolol succinate (TOPROL-XL) 25 MG 24 hr tablet TAKE ONE (1) TABLET BY MOUTH ONCE DAILY  . [DISCONTINUED] pravastatin (PRAVACHOL) 40 MG tablet Take 1 tablet (40 mg total) by mouth daily.   No facility-administered encounter medications on file as of 10/27/2016.     Review of Systems  Constitutional: Negative for appetite change and unexpected weight change.  HENT: Negative for congestion and sinus pressure.   Respiratory: Negative for cough, chest tightness and  shortness of breath.   Cardiovascular: Negative for chest pain, palpitations and leg swelling.  Gastrointestinal: Negative for abdominal pain, diarrhea, nausea and vomiting.  Genitourinary: Negative for difficulty urinating and dysuria.  Musculoskeletal: Negative for back pain and joint swelling.  Skin: Negative for color change and rash.  Neurological: Negative for dizziness, light-headedness and headaches.  Psychiatric/Behavioral: Negative for agitation and dysphoric mood.       Objective:    Physical Exam  Constitutional: She appears well-developed and well-nourished. No distress.  HENT:  Nose: Nose normal.  Mouth/Throat: Oropharynx is clear and moist.  Neck: Neck supple. No thyromegaly present.  Cardiovascular: Normal rate and regular rhythm.     Pulmonary/Chest: Breath sounds normal. No respiratory distress. She has no wheezes.  Abdominal: Soft. Bowel sounds are normal. There is no tenderness.  Musculoskeletal: She exhibits no edema or tenderness.  Lymphadenopathy:    She has no cervical adenopathy.  Skin: No rash noted. No erythema.  Psychiatric: She has a normal mood and affect. Her behavior is normal.    BP 130/62 (BP Location: Left Arm, Patient Position: Sitting, Cuff Size: Normal)   Pulse 62   Temp 98.6 F (37 C) (Oral)   Ht _0  (1.6 m)   Wt 150 lb 6.4 oz (68.2 kg)   SpO2 96%   BMI 26.64 kg/m  Wt Readings from Last 3 Encounters:  10/27/16 150 lb 6.4 oz (68.2 kg)  05/30/16 154 lb 2 oz (69.9 kg)  05/28/16 154 lb (69.9 kg)     Lab Results  Component Value Date   WBC 7.1 03/06/2016   HGB 14.4 03/06/2016   HCT 43.8 03/06/2016   PLT 278.0 03/06/2016   GLUCOSE 112 (H) 10/24/2016   CHOL 176 10/24/2016   TRIG 116.0 10/24/2016   HDL 41.60 10/24/2016   LDLCALC 111 (H) 10/24/2016   ALT 8 10/24/2016   AST 14 10/24/2016   NA 138 10/24/2016   K 4.6 10/24/2016   CL 102 10/24/2016   CREATININE 0.94 10/24/2016   BUN 12 10/24/2016   CO2 31 10/24/2016   TSH 1.86 10/24/2016   HGBA1C 6.3 10/24/2016   MICROALBUR 4.5 (H) 10/24/2016    Mm Screening Breast Tomo Bilateral  Result Date: 09/18/2016 CLINICAL DATA:  Screening. EXAM: 2D DIGITAL SCREENING BILATERAL MAMMOGRAM WITH CAD AND ADJUNCT TOMO COMPARISON:  Previous exam(s). ACR Breast Density Category b: There are scattered areas of fibroglandular density. FINDINGS: There are no findings suspicious for malignancy. Images were processed with CAD. IMPRESSION: No mammographic evidence of malignancy. A result letter of this screening mammogram will be mailed directly to the patient. RECOMMENDATION: Screening mammogram in one year. (Code:SM-B-01Y) BI-RADS CATEGORY  1: Negative. Electronically Signed   By: Ammie Ferrier M.D.   On: 09/18/2016 08:58       Assessment & Plan:    Problem List Items Addressed This Visit    CAD (coronary artery disease)    Just evaluated by cardiology.  No changes made.  Stable.        Relevant Medications   lisinopril (PRINIVIL,ZESTRIL) 20 MG tablet   metoprolol succinate (TOPROL-XL) 25 MG 24 hr tablet   pravastatin (PRAVACHOL) 40 MG tablet   Dementia    On aricept.  Followed by neurology.       Relevant Medications   donepezil (ARICEPT) 10 MG tablet   Hypercholesteremia    On pravastatin 4m q day.  Liver function tests increased on lipitor.  Discussed increasing pravastatin.  Husband prefers not to make changes  at this time.  Follow.        Relevant Medications   lisinopril (PRINIVIL,ZESTRIL) 20 MG tablet   metoprolol succinate (TOPROL-XL) 25 MG 24 hr tablet   pravastatin (PRAVACHOL) 40 MG tablet   Other Relevant Orders   Hepatic function panel   Lipid panel   Hyperglycemia    Low carb diet and exercise.  Follow met b and a1c.        Relevant Orders   Hemoglobin A1c   Hypertension    Blood pressure under reasonable. control.  Continue same medication regimen.  Follow pressures.  Follow metabolic panel.  Discussed blood pressure today.  Desires not to adjust medication.        Relevant Medications   lisinopril (PRINIVIL,ZESTRIL) 20 MG tablet   metoprolol succinate (TOPROL-XL) 25 MG 24 hr tablet   pravastatin (PRAVACHOL) 40 MG tablet   Other Relevant Orders   CBC with Differential/Platelet   Basic metabolic panel   Hypothyroidism    On thyroid replacement.  Follow tsh.       Relevant Medications   levothyroxine (SYNTHROID) 75 MCG tablet   metoprolol succinate (TOPROL-XL) 25 MG 24 hr tablet    Other Visit Diagnoses    Encounter for immunization       Relevant Medications   donepezil (ARICEPT) 10 MG tablet   levothyroxine (SYNTHROID) 75 MCG tablet   lisinopril (PRINIVIL,ZESTRIL) 20 MG tablet   metoprolol succinate (TOPROL-XL) 25 MG 24 hr tablet   pravastatin (PRAVACHOL) 40 MG tablet   Other  Relevant Orders   Flu vaccine HIGH DOSE PF (Completed)       Einar Pheasant, MD

## 2016-10-28 ENCOUNTER — Encounter: Payer: Self-pay | Admitting: Internal Medicine

## 2016-10-28 NOTE — Assessment & Plan Note (Addendum)
Blood pressure under reasonable. control.  Continue same medication regimen.  Follow pressures.  Follow metabolic panel.  Discussed blood pressure today.  Desires not to adjust medication.

## 2016-10-28 NOTE — Assessment & Plan Note (Signed)
Just evaluated by cardiology.  No changes made.  Stable.

## 2016-10-28 NOTE — Assessment & Plan Note (Signed)
Low carb diet and exercise.  Follow met b and a1c.   

## 2016-10-28 NOTE — Assessment & Plan Note (Signed)
On aricept.  Followed by neurology.  

## 2016-10-28 NOTE — Assessment & Plan Note (Signed)
On pravastatin  q day.  Liver function tests increased on lipitor.  Discussed increasing pravastatin.  Husband prefers not to make changes at this time.  Follow.

## 2016-10-28 NOTE — Assessment & Plan Note (Signed)
On thyroid replacement.  Follow tsh.  

## 2016-11-03 ENCOUNTER — Telehealth: Payer: Self-pay | Admitting: Internal Medicine

## 2016-11-03 NOTE — Telephone Encounter (Signed)
Pt daughter Harriett Sine called and stated that she would like to speak with Dr. Lorin Picket about her mom's overall health. She has some concerns about her dementia and noticed that pt has been very anxious and sobbing all the time. Daughter would like to know if something for the anxiety. No DPR listed so going to have them come in and sign one.

## 2016-11-04 NOTE — Telephone Encounter (Signed)
Need information.  Pt was just seen and doing ok.  Husband reported pt stable.   Just need more information.

## 2016-11-04 NOTE — Telephone Encounter (Signed)
Do you know this patient family? Am I ok to talk to daughter?

## 2016-11-05 NOTE — Telephone Encounter (Signed)
Called daughter states she went to visit the last weekend they have noticed a big change in her anxiety level last few weeks. She is very tearfull and becoming suspicious thinks family is talking about her. Wanted to make you aware and see if there are any medications that may help. She was informed that we do not have DPR to talk to her about records. But I have documented all information. And will let you know and we will address.

## 2016-11-05 NOTE — Telephone Encounter (Signed)
She is seeing neurology and is on aricept.  Can schedule a f/u appt if desires and can discuss in more detail.

## 2016-11-10 NOTE — Telephone Encounter (Signed)
Spoke to husband wold like to have app. I do not see anything open.

## 2016-11-10 NOTE — Telephone Encounter (Signed)
I can see her on 06/21/16 if no one else holding to be put in at this time.

## 2016-11-11 NOTE — Telephone Encounter (Signed)
Is this correct date that you want me to check?

## 2016-11-11 NOTE — Telephone Encounter (Signed)
Left message to return call to our office.  

## 2016-11-11 NOTE — Telephone Encounter (Signed)
See if can come in 11/21/16 at 12:00 if not being held for someone.

## 2016-11-12 NOTE — Telephone Encounter (Signed)
I have called and let patient know the can come in on 10/26 at 12:00. Can you put in schedule?

## 2016-11-12 NOTE — Telephone Encounter (Signed)
Scheduled

## 2016-11-21 ENCOUNTER — Encounter: Payer: Self-pay | Admitting: Internal Medicine

## 2016-11-21 ENCOUNTER — Ambulatory Visit (INDEPENDENT_AMBULATORY_CARE_PROVIDER_SITE_OTHER): Payer: Medicare Other | Admitting: Internal Medicine

## 2016-11-21 DIAGNOSIS — E039 Hypothyroidism, unspecified: Secondary | ICD-10-CM

## 2016-11-21 DIAGNOSIS — I2510 Atherosclerotic heart disease of native coronary artery without angina pectoris: Secondary | ICD-10-CM

## 2016-11-21 DIAGNOSIS — I10 Essential (primary) hypertension: Secondary | ICD-10-CM | POA: Diagnosis not present

## 2016-11-21 DIAGNOSIS — E7800 Pure hypercholesterolemia, unspecified: Secondary | ICD-10-CM

## 2016-11-21 DIAGNOSIS — F0390 Unspecified dementia without behavioral disturbance: Secondary | ICD-10-CM

## 2016-11-21 DIAGNOSIS — R739 Hyperglycemia, unspecified: Secondary | ICD-10-CM

## 2016-11-21 MED ORDER — MEMANTINE HCL 5 MG PO TABS: 5.0000 mg | ORAL_TABLET | Freq: Two times a day (BID) | ORAL | 2 refills | Status: DC

## 2016-11-21 NOTE — Progress Notes (Signed)
Patient ID: Amanda [name], female   DOB: 08/03/1933, 83 y.o.   MRN: [id number]   Subjective:    Patient ID: Amanda [name], female    DOB: [birth year], 83 y.o.   MRN: [id number]  HPI  Patient here as a work in to discuss some concerns regarding memory and increased emotions.  Daughter was visiting and was concerned.  appt made.  She is accompanied by her husband.  History obtained from both of them.  Husband reports no increased crying episodes.  Emotionally - stable.  Does question if there are any other medications to use for her memory.  Discussed treatment options.  Eating.  No nausea or vomiting.  Bowels moving.  No abdominal pain.     Past Medical History:  Diagnosis Date  . Carotid bruit    bilaterally  . Dementia   . Hypercholesterolemia   . Hypertension   . Hypothyroidism    Past Surgical History:  Procedure Laterality Date  . CARPAL TUNNEL RELEASE    . CHOLECYSTECTOMY  1991  . THORACIC AORTA STENT  12/13   heart stent   Family History  Problem Relation Age of Onset  . Hypertension Father   . Lung cancer Father   . Hypertension Mother   . Dementia Mother   . Arthritis Unknown   . Breast cancer Cousin   . Colon cancer Neg Hx    Social History   Social History  . Marital status: Married    Spouse name: N/A  . Number of children: 2  . Years of education: N/A   Occupational History  . retired Pharmacist, hospital    Social History Main Topics  . Smoking status: Never Smoker  . Smokeless tobacco: Never Used  . Alcohol use No  . Drug use: No  . Sexual activity: Not Asked   Other Topics Concern  . None   Social History Narrative   She is an only child. 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    Outpatient Encounter Prescriptions as of 11/21/2016  Medication Sig  . aspirin 325 MG EC tablet Take 325 mg by mouth daily.  Marland Kitchen donepezil (ARICEPT) 10 MG tablet TAKE ONE TABLET AT BEDTIME.  Marland Kitchen levothyroxine (SYNTHROID) 75 MCG tablet Take 0.5  tablets (37.5 mcg total) by mouth daily.  Marland Kitchen lisinopril (PRINIVIL,ZESTRIL) 20 MG tablet Take 1 tablet (20 mg total) by mouth daily.  . metoprolol succinate (TOPROL-XL) 25 MG 24 hr tablet TAKE ONE (1) TABLET BY MOUTH ONCE DAILY  . pravastatin (PRAVACHOL) 40 MG tablet Take 1 tablet (40 mg total) by mouth daily.  . [DISCONTINUED] doxycycline (VIBRAMYCIN) 100 MG capsule Take 1 capsule (100 mg total) by mouth 2 (two) times daily.  . memantine (NAMENDA) 5 MG tablet Take 1 tablet (5 mg total) by mouth 2 (two) times daily.   No facility-administered encounter medications on file as of 11/21/2016.     Review of Systems  Constitutional: Negative for appetite change and unexpected weight change.  HENT: Negative for congestion and sinus pressure.   Respiratory: Negative for cough, chest tightness and shortness of breath.   Cardiovascular: Negative for chest pain, palpitations and leg swelling.  Gastrointestinal: Negative for abdominal pain, diarrhea, nausea and vomiting.  Genitourinary: Negative for difficulty urinating and dysuria.  Musculoskeletal: Negative for joint swelling and myalgias.  Skin: Negative for color change and rash.  Neurological: Negative for dizziness, light-headedness and headaches.  Psychiatric/Behavioral: Negative for agitation and dysphoric mood.  Objective:    Physical Exam  Constitutional: She appears well-developed and well-nourished. No distress.  HENT:  Nose: Nose normal.  Mouth/Throat: Oropharynx is clear and moist.  Neck: Neck supple. No thyromegaly present.  Cardiovascular: Normal rate and regular rhythm.   Pulmonary/Chest: Breath sounds normal. No respiratory distress. She has no wheezes.  Abdominal: Soft. Bowel sounds are normal. There is no tenderness.  Musculoskeletal: She exhibits no edema or tenderness.  Lymphadenopathy:    She has no cervical adenopathy.  Skin: No rash noted. No erythema.  Psychiatric: She has a normal mood and affect. Her  behavior is normal.    BP 132/64 (BP Location: Left Arm, Patient Position: Sitting, Cuff Size: Normal)   Pulse 82   Temp 98.6 F (37 C) (Oral)   Resp 14   Ht 5' 3"  (1.6 m)   Wt 152 lb 12.8 oz (69.3 kg)   BMI 27.07 kg/m  Wt Readings from Last 3 Encounters:  11/21/16 152 lb 12.8 oz (69.3 kg)  10/27/16 150 lb 6.4 oz (68.2 kg)  05/30/16 154 lb 2 oz (69.9 kg)     Lab Results  Component Value Date   WBC 7.1 03/06/2016   HGB 14.4 03/06/2016   HCT 43.8 03/06/2016   PLT 278.0 03/06/2016   GLUCOSE 112 (H) 10/24/2016   CHOL 176 10/24/2016   TRIG 116.0 10/24/2016   HDL 41.60 10/24/2016   LDLCALC 111 (H) 10/24/2016   ALT 8 10/24/2016   AST 14 10/24/2016   NA 138 10/24/2016   K 4.6 10/24/2016   CL 102 10/24/2016   CREATININE 0.94 10/24/2016   BUN 12 10/24/2016   CO2 31 10/24/2016   TSH 1.86 10/24/2016   HGBA1C 6.3 10/24/2016   MICROALBUR 4.5 (H) 10/24/2016    Mm Screening Breast Tomo Bilateral  Result Date: 09/18/2016 CLINICAL DATA:  Screening. EXAM: 2D DIGITAL SCREENING BILATERAL MAMMOGRAM WITH CAD AND ADJUNCT TOMO COMPARISON:  Previous exam(s). ACR Breast Density Category b: There are scattered areas of fibroglandular density. FINDINGS: There are no findings suspicious for malignancy. Images were processed with CAD. IMPRESSION: No mammographic evidence of malignancy. A result letter of this screening mammogram will be mailed directly to the patient. RECOMMENDATION: Screening mammogram in one year. (Code:SM-B-01Y) BI-RADS CATEGORY  1: Negative. Electronically Signed   By: Ammie Ferrier M.D.   On: 09/18/2016 08:58       Assessment & Plan:   Problem List Items Addressed This Visit    CAD (coronary artery disease)    Followed by cardiology.  Stable.        Dementia    Discussed with them today.  Discussed treatment options.  On aricept.  Will add namenda 24m bid.  Follow closely.  Call with update.        Relevant Medications   memantine (NAMENDA) 5 MG tablet    Hypercholesteremia    On pravastatin.  Follow lipid panel and liver function tests.        Hyperglycemia    Low carb diet and exercise.  Follow met b and a1c.        Hypertension    Blood pressure under good control.  Continue same medication regimen.  Follow pressures.  Follow metabolic panel.        Hypothyroidism    On thyroid replacement.  Follow tsh.            SEinar Pheasant MD

## 2016-11-23 ENCOUNTER — Encounter: Payer: Self-pay | Admitting: Internal Medicine

## 2016-11-23 NOTE — Assessment & Plan Note (Signed)
Blood pressure under good control.  Continue same medication regimen.  Follow pressures.  Follow metabolic panel.   

## 2016-11-23 NOTE — Assessment & Plan Note (Signed)
Low carb diet and exercise.  Follow met b and a1c.   

## 2016-11-23 NOTE — Assessment & Plan Note (Signed)
Followed by cardiology. Stable.   

## 2016-11-23 NOTE — Assessment & Plan Note (Signed)
On pravastatin.  Follow lipid panel and liver function tests.   

## 2016-11-23 NOTE — Assessment & Plan Note (Signed)
On thyroid replacement.  Follow tsh.  

## 2016-11-23 NOTE — Assessment & Plan Note (Signed)
Discussed with them today.  Discussed treatment options.  On aricept.  Will add namenda 5mg  bid.  Follow closely.  Call with update.

## 2017-02-27 ENCOUNTER — Other Ambulatory Visit (INDEPENDENT_AMBULATORY_CARE_PROVIDER_SITE_OTHER): Payer: Medicare Other

## 2017-02-27 DIAGNOSIS — E7800 Pure hypercholesterolemia, unspecified: Secondary | ICD-10-CM

## 2017-02-27 DIAGNOSIS — R739 Hyperglycemia, unspecified: Secondary | ICD-10-CM

## 2017-02-27 DIAGNOSIS — I10 Essential (primary) hypertension: Secondary | ICD-10-CM | POA: Diagnosis not present

## 2017-02-27 LAB — HEPATIC FUNCTION PANEL
ALT: 7 U/L (ref 0–35)
AST: 12 U/L (ref 0–37)
Albumin: 3.9 g/dL (ref 3.5–5.2)
Alkaline Phosphatase: 74 U/L (ref 39–117)
BILIRUBIN DIRECT: 0.1 mg/dL (ref 0.0–0.3)
BILIRUBIN TOTAL: 0.5 mg/dL (ref 0.2–1.2)
Total Protein: 7.5 g/dL (ref 6.0–8.3)

## 2017-02-27 LAB — CBC WITH DIFFERENTIAL/PLATELET
BASOS PCT: 0.8 % (ref 0.0–3.0)
Basophils Absolute: 0.1 10*3/uL (ref 0.0–0.1)
Eosinophils Absolute: 0.2 10*3/uL (ref 0.0–0.7)
Eosinophils Relative: 1.8 % (ref 0.0–5.0)
HEMATOCRIT: 42 % (ref 36.0–46.0)
HEMOGLOBIN: 13.7 g/dL (ref 12.0–15.0)
LYMPHS PCT: 16.4 % (ref 12.0–46.0)
Lymphs Abs: 1.7 10*3/uL (ref 0.7–4.0)
MCHC: 32.5 g/dL (ref 30.0–36.0)
MCV: 87.2 fl (ref 78.0–100.0)
MONOS PCT: 9 % (ref 3.0–12.0)
Monocytes Absolute: 0.9 10*3/uL (ref 0.1–1.0)
Neutro Abs: 7.4 10*3/uL (ref 1.4–7.7)
Neutrophils Relative %: 72 % (ref 43.0–77.0)
Platelets: 346 10*3/uL (ref 150.0–400.0)
RBC: 4.82 Mil/uL (ref 3.87–5.11)
RDW: 14.7 % (ref 11.5–15.5)
WBC: 10.3 10*3/uL (ref 4.0–10.5)

## 2017-02-27 LAB — LIPID PANEL
CHOLESTEROL: 190 mg/dL (ref 0–200)
HDL: 47.6 mg/dL (ref >39.00)
LDL CALC: 117 mg/dL — AB (ref 0–99)
NONHDL: 142.45
Total CHOL/HDL Ratio: 4
Triglycerides: 125 mg/dL (ref 0.0–149.0)
VLDL: 25 mg/dL (ref 0.0–40.0)

## 2017-02-27 LAB — BASIC METABOLIC PANEL
BUN: 18 mg/dL (ref 6–23)
CHLORIDE: 103 meq/L (ref 96–112)
CO2: 32 mEq/L (ref 19–32)
Calcium: 9.8 mg/dL (ref 8.4–10.5)
Creatinine, Ser: 0.99 mg/dL (ref 0.40–1.20)
GFR: 57.1 mL/min — ABNORMAL LOW (ref >60.00)
GLUCOSE: 120 mg/dL — AB (ref 70–99)
Potassium: 4.9 mEq/L (ref 3.5–5.1)
Sodium: 141 mEq/L (ref 135–145)

## 2017-02-27 LAB — HEMOGLOBIN A1C: HEMOGLOBIN A1C: 6.6 % — AB (ref 4.6–6.5)

## 2017-03-03 ENCOUNTER — Encounter: Payer: Self-pay | Admitting: Internal Medicine

## 2017-03-03 ENCOUNTER — Ambulatory Visit (INDEPENDENT_AMBULATORY_CARE_PROVIDER_SITE_OTHER): Payer: Medicare Other | Admitting: Internal Medicine

## 2017-03-03 VITALS — BP 136/62 | HR 78 | Temp 97.6°F | Resp 18 | Wt 151.6 lb

## 2017-03-03 DIAGNOSIS — Z0001 Encounter for general adult medical examination with abnormal findings: Secondary | ICD-10-CM | POA: Diagnosis not present

## 2017-03-03 DIAGNOSIS — F0390 Unspecified dementia without behavioral disturbance: Secondary | ICD-10-CM

## 2017-03-03 DIAGNOSIS — Z0000 Encounter for general adult medical examination without abnormal findings: Secondary | ICD-10-CM

## 2017-03-03 DIAGNOSIS — E039 Hypothyroidism, unspecified: Secondary | ICD-10-CM

## 2017-03-03 DIAGNOSIS — I2510 Atherosclerotic heart disease of native coronary artery without angina pectoris: Secondary | ICD-10-CM

## 2017-03-03 DIAGNOSIS — E7800 Pure hypercholesterolemia, unspecified: Secondary | ICD-10-CM

## 2017-03-03 DIAGNOSIS — E1159 Type 2 diabetes mellitus with other circulatory complications: Secondary | ICD-10-CM | POA: Diagnosis not present

## 2017-03-03 DIAGNOSIS — M791 Myalgia, unspecified site: Secondary | ICD-10-CM | POA: Diagnosis not present

## 2017-03-03 NOTE — Assessment & Plan Note (Addendum)
Physical today 03/03/17.  Mammogram 09/18/16 - Birads I.  Colonoscopy 06/11/06.  Discussed shingles vaccine (shingrix)

## 2017-03-03 NOTE — Progress Notes (Addendum)
Patient ID: Amanda [name], female   DOB: 12/14/1933, 83 y.o.   MRN: [id number]   Subjective:    Patient ID: Amanda [name], female    DOB: 02/11/1933, 83 y.o.   MRN: [id number]  HPI  Patient here for her physical exam.  She is accompanied by her husband.  History obtained from both of them.  She reports she is doing relatively well.  Husband reports that she was having problems with muscle aching.  States she was having problems getting up, etc.  He stopped the cholesterol medication two weeks ago and states she is much improved.  She reports still noticing some aching, but "not bad".  Husband also wants to stop namenda.  States he is concerned this is contributing as well.  No chest pain.  Breathing stable.  No acid reflux.  No abdominal pain.  Bowels moving.  Discussed labs.     Past Medical History:  Diagnosis Date  . Carotid bruit    bilaterally  . Dementia   . Hypercholesterolemia   . Hypertension   . Hypothyroidism    Past Surgical History:  Procedure Laterality Date  . CARPAL TUNNEL RELEASE    . CHOLECYSTECTOMY  1991  . THORACIC AORTA STENT  12/13   heart stent   Family History  Problem Relation Age of Onset  . Hypertension Father   . Lung cancer Father   . Hypertension Mother   . Dementia Mother   . Arthritis Unknown   . Breast cancer Cousin   . Colon cancer Neg Hx    Social History   Socioeconomic History  . Marital status: Married    Spouse name: None  . Number of children: 2  . Years of education: None  . Highest education level: None  Social Needs  . Financial resource strain: None  . Food insecurity - worry: None  . Food insecurity - inability: None  . Transportation needs - medical: None  . Transportation needs - non-medical: None  Occupational History  . Occupation: retired teacher  Tobacco Use  . Smoking status: Never Smoker  . Smokeless tobacco: Never Used  Substance and Sexual Activity  . Alcohol use: No    Alcohol/week: 0.0 oz  . Drug use: No    . Sexual activity: None  Other Topics Concern  . None  Social History Narrative   She is an only child. 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    Outpatient Encounter Medications as of 03/03/2017  Medication Sig  . aspirin 325 MG EC tablet Take 325 mg by mouth daily.  . donepezil (ARICEPT) 10 MG tablet TAKE ONE TABLET AT BEDTIME.  . levothyroxine (SYNTHROID) 75 MCG tablet Take 0.5 tablets (37.5 mcg total) by mouth daily.  . lisinopril (PRINIVIL,ZESTRIL) 20 MG tablet Take 1 tablet (20 mg total) by mouth daily.  . metoprolol succinate (TOPROL-XL) 25 MG 24 hr tablet TAKE ONE (1) TABLET BY MOUTH ONCE DAILY  . [DISCONTINUED] memantine (NAMENDA) 5 MG tablet Take 1 tablet (5 mg total) by mouth 2 (two) times daily. (Patient not taking: Reported on 03/03/2017)  . [DISCONTINUED] pravastatin (PRAVACHOL) 40 MG tablet Take 1 tablet (40 mg total) by mouth daily. (Patient not taking: Reported on 03/03/2017)   No facility-administered encounter medications on file as of 03/03/2017.     Review of Systems  Constitutional: Negative for appetite change and unexpected weight change.  HENT: Negative for congestion and sinus pressure.   Eyes:   Negative for pain and visual disturbance.  Respiratory: Negative for cough, chest tightness and shortness of breath.   Cardiovascular: Negative for chest pain, palpitations and leg swelling.  Gastrointestinal: Negative for abdominal pain, diarrhea, nausea and vomiting.  Genitourinary: Negative for difficulty urinating and dysuria.  Musculoskeletal: Negative for joint swelling.       Muscle aches as outlined.  Improved.    Skin: Negative for color change and rash.  Neurological: Negative for dizziness, light-headedness and headaches.  Hematological: Negative for adenopathy. Does not bruise/bleed easily.  Psychiatric/Behavioral: Negative for agitation and dysphoric mood.       Objective:     Blood pressure rechecked by me:   136/62  Physical Exam  Constitutional: She is oriented to person, place, and time. She appears well-developed and well-nourished. No distress.  HENT:  Nose: Nose normal.  Mouth/Throat: Oropharynx is clear and moist.  Eyes: Right eye exhibits no discharge. Left eye exhibits no discharge. No scleral icterus.  Neck: Neck supple. No thyromegaly present.  Cardiovascular: Normal rate and regular rhythm.  Pulmonary/Chest: Breath sounds normal. No accessory muscle usage. No tachypnea. No respiratory distress. She has no decreased breath sounds. She has no wheezes. She has no rhonchi. Right breast exhibits no inverted nipple, no mass, no nipple discharge and no tenderness (no axillary adenopathy). Left breast exhibits no inverted nipple, no mass, no nipple discharge and no tenderness (no axilarry adenopathy).  Abdominal: Soft. Bowel sounds are normal. There is no tenderness.  Musculoskeletal: She exhibits no edema or tenderness.  Lymphadenopathy:    She has no cervical adenopathy.  Neurological: She is alert and oriented to person, place, and time.  Skin: Skin is warm. No rash noted. No erythema.  Psychiatric: She has a normal mood and affect. Her behavior is normal.    BP 136/62   Pulse 78   Temp 97.6 F (36.4 C) (Oral)   Resp 18   Wt 151 lb 9.6 oz (68.8 kg)   SpO2 97%   BMI 26.85 kg/m  Wt Readings from Last 3 Encounters:  03/03/17 151 lb 9.6 oz (68.8 kg)  11/21/16 152 lb 12.8 oz (69.3 kg)  10/27/16 150 lb 6.4 oz (68.2 kg)     Lab Results  Component Value Date   WBC 10.3 02/27/2017   HGB 13.7 02/27/2017   HCT 42.0 02/27/2017   PLT 346.0 02/27/2017   GLUCOSE 120 (H) 02/27/2017   CHOL 190 02/27/2017   TRIG 125.0 02/27/2017   HDL 47.60 02/27/2017   LDLCALC 117 (H) 02/27/2017   ALT 7 02/27/2017   AST 12 02/27/2017   NA 141 02/27/2017   K 4.9 02/27/2017   CL 103 02/27/2017   CREATININE 0.99 02/27/2017   BUN 18 02/27/2017   CO2 32 02/27/2017   TSH 1.86 10/24/2016   HGBA1C  6.6 (H) 02/27/2017   MICROALBUR 4.5 (H) 10/24/2016    Mm Screening Breast Tomo Bilateral  Result Date: 09/18/2016 CLINICAL DATA:  Screening. EXAM: 2D DIGITAL SCREENING BILATERAL MAMMOGRAM WITH CAD AND ADJUNCT TOMO COMPARISON:  Previous exam(s). ACR Breast Density Category b: There are scattered areas of fibroglandular density. FINDINGS: There are no findings suspicious for malignancy. Images were processed with CAD. IMPRESSION: No mammographic evidence of malignancy. A result letter of this screening mammogram will be mailed directly to the patient. RECOMMENDATION: Screening mammogram in one year. (Code:SM-B-01Y) BI-RADS CATEGORY  1: Negative. Electronically Signed   By: Michelle  Collins M.D.   On: 09/18/2016 08:58       Assessment &   Plan:   Problem List Items Addressed This Visit    CAD (coronary artery disease)    Followed by cardiology.  Stable. Continue risk factor modification.  Off cholesterol medication as outlined.        Dementia    On aricept.  Wants to stop namenda.  Feels not helping.  Husband also concerned may be contributing to her muscles aches.  Discussed with him.  Will stop.  Follow.        Diabetes mellitus with circulatory complication (HCC)    Low carb diet and exercise.  Recent a1c 6.6.  Follow met b and a1c.       Health care maintenance    Physical today 03/03/17.  Mammogram 09/18/16 - [facility] I.  Colonoscopy 06/11/06.  Discussed shingles vaccine (shingrix)      Hypercholesteremia    Was on pravastatin.  Off now secondary to muscle aches.  Aching has improved.  Follow.  Low cholesterol diet and exercise.  Discussed recent labs.   Lab Results  Component Value Date   CHOL 190 02/27/2017   HDL 47.60 02/27/2017   LDLCALC 117 (H) 02/27/2017   TRIG 125.0 02/27/2017   CHOLHDL 4 02/27/2017        Hypothyroidism    On thyroid replacement.  Follow tsh.       Muscle ache    Was having increased muscle aches as outlined.  Husband stopped pravastatin.  Has  improved.  Wants to stop namenda.  Discussed further w/up and evlauation.  Discussed labs, including esr.  Wants to continue medication adjustment first. If does not continue to improve or if worsens, he will notify me.         Other Visit Diagnoses    Routine general medical examination at a health care facility    -  Primary       Einar Pheasant, MD

## 2017-03-06 ENCOUNTER — Encounter: Payer: Self-pay | Admitting: Internal Medicine

## 2017-03-06 DIAGNOSIS — M791 Myalgia, unspecified site: Secondary | ICD-10-CM | POA: Insufficient documentation

## 2017-03-06 NOTE — Assessment & Plan Note (Signed)
On aricept.  Wants to stop namenda.  Feels not helping.  Husband also concerned may be contributing to her muscles aches.  Discussed with him.  Will stop.  Follow.

## 2017-03-06 NOTE — Assessment & Plan Note (Signed)
Followed by cardiology.  Stable. Continue risk factor modification.  Off cholesterol medication as outlined.

## 2017-03-06 NOTE — Assessment & Plan Note (Signed)
Low carb diet and exercise.  Recent a1c 6.6.  Follow met b and a1c.

## 2017-03-06 NOTE — Assessment & Plan Note (Signed)
Was having increased muscle aches as outlined.  Husband stopped pravastatin.  Has improved.  Wants to stop namenda.  Discussed further w/up and evlauation.  Discussed labs, including esr.  Wants to continue medication adjustment first. If does not continue to improve or if worsens, he will notify me.

## 2017-03-06 NOTE — Assessment & Plan Note (Signed)
Was on pravastatin.  Off now secondary to muscle aches.  Aching has improved.  Follow.  Low cholesterol diet and exercise.  Discussed recent labs.   Lab Results  Component Value Date   CHOL 190 02/27/2017   HDL 47.60 02/27/2017   LDLCALC 117 (H) 02/27/2017   TRIG 125.0 02/27/2017   CHOLHDL 4 02/27/2017

## 2017-03-06 NOTE — Assessment & Plan Note (Signed)
On thyroid replacement.  Follow tsh.  

## 2017-05-07 ENCOUNTER — Ambulatory Visit: Payer: Medicare Other | Admitting: Internal Medicine

## 2017-05-07 ENCOUNTER — Encounter: Payer: Self-pay | Admitting: Internal Medicine

## 2017-05-07 DIAGNOSIS — E039 Hypothyroidism, unspecified: Secondary | ICD-10-CM | POA: Diagnosis not present

## 2017-05-07 DIAGNOSIS — F0390 Unspecified dementia without behavioral disturbance: Secondary | ICD-10-CM

## 2017-05-07 DIAGNOSIS — E7800 Pure hypercholesterolemia, unspecified: Secondary | ICD-10-CM | POA: Diagnosis not present

## 2017-05-07 DIAGNOSIS — E1159 Type 2 diabetes mellitus with other circulatory complications: Secondary | ICD-10-CM | POA: Diagnosis not present

## 2017-05-07 DIAGNOSIS — I2510 Atherosclerotic heart disease of native coronary artery without angina pectoris: Secondary | ICD-10-CM | POA: Diagnosis not present

## 2017-05-07 NOTE — Progress Notes (Signed)
Patient ID: Amanda [name], female   DOB: 27-Feb-1933, 84 y.o.   MRN: [id number]   Subjective:    Patient ID: Amanda [name], female    DOB: May 09, 1933, 84 y.o.   MRN: [id number]  HPI  Patient here for a scheduled follow up.  She is accompanied by her husband.  History obtained from both of them.  Reports she is doing well.  Had intolerance to statin medication.  See last note for details.  Statin stopped.  Pt back to normal.  Energy level is good.  No chest pain.  No sob.  No acid reflux.  No abdominal pain.  Bowels moving.  Overall feels good.  Memory stable on current regimen.  Blood pressure doing well.     Past Medical History:  Diagnosis Date  . Carotid bruit    bilaterally  . Dementia   . Hypercholesterolemia   . Hypertension   . Hypothyroidism    Past Surgical History:  Procedure Laterality Date  . CARPAL TUNNEL RELEASE    . CHOLECYSTECTOMY  1991  . THORACIC AORTA STENT  12/13   heart stent   Family History  Problem Relation Age of Onset  . Hypertension Father   . Lung cancer Father   . Hypertension Mother   . Dementia Mother   . Arthritis Unknown   . Breast cancer Cousin   . Colon cancer Neg Hx    Social History   Socioeconomic History  . Marital status: Married    Spouse name: Not on file  . Number of children: 2  . Years of education: Not on file  . Highest education level: Not on file  Occupational History  . Occupation: retired Tour manager  . Financial resource strain: Not on file  . Food insecurity:    Worry: Not on file    Inability: Not on file  . Transportation needs:    Medical: Not on file    Non-medical: Not on file  Tobacco Use  . Smoking status: Never Smoker  . Smokeless tobacco: Never Used  Substance and Sexual Activity  . Alcohol use: No    Alcohol/week: 0.0 oz  . Drug use: No  . Sexual activity: Not on file  Lifestyle  . Physical activity:    Days per week: Not on file    Minutes per session: Not on file  . Stress: Not on  file  Relationships  . Social connections:    Talks on phone: Not on file    Gets together: Not on file    Attends religious service: Not on file    Active member of club or organization: Not on file    Attends meetings of clubs or organizations: Not on file    Relationship status: Not on file  Other Topics Concern  . Not on file  Social History Narrative   She is an only child. 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    Outpatient Encounter Medications as of 05/07/2017  Medication Sig  . aspirin 325 MG EC tablet Take 325 mg by mouth daily.  Marland Kitchen donepezil (ARICEPT) 10 MG tablet TAKE ONE TABLET AT BEDTIME.  Marland Kitchen levothyroxine (SYNTHROID) 75 MCG tablet Take 0.5 tablets (37.5 mcg total) by mouth daily.  Marland Kitchen lisinopril (PRINIVIL,ZESTRIL) 20 MG tablet Take 1 tablet (20 mg total) by mouth daily.  . metoprolol succinate (TOPROL-XL) 25 MG 24 hr tablet TAKE ONE (1) TABLET BY MOUTH ONCE DAILY  No facility-administered encounter medications on file as of 05/07/2017.     Review of Systems  Constitutional: Negative for appetite change and unexpected weight change.  HENT: Negative for congestion and sinus pressure.   Respiratory: Negative for cough, chest tightness and shortness of breath.   Cardiovascular: Negative for chest pain, palpitations and leg swelling.  Gastrointestinal: Negative for abdominal pain, diarrhea, nausea and vomiting.  Genitourinary: Negative for difficulty urinating and dysuria.  Musculoskeletal: Negative for joint swelling and myalgias.  Skin: Negative for color change and rash.  Neurological: Negative for dizziness, light-headedness and headaches.  Psychiatric/Behavioral: Negative for agitation and dysphoric mood.       Objective:    Physical Exam  Constitutional: She appears well-developed and well-nourished. No distress.  HENT:  Nose: Nose normal.  Mouth/Throat: Oropharynx is clear and moist.  Neck: Neck supple. No thyromegaly  present.  Cardiovascular: Normal rate and regular rhythm.  Pulmonary/Chest: Breath sounds normal. No respiratory distress. She has no wheezes.  Abdominal: Soft. Bowel sounds are normal. There is no tenderness.  Musculoskeletal: She exhibits no edema or tenderness.  Lymphadenopathy:    She has no cervical adenopathy.  Skin: No rash noted. No erythema.  Psychiatric: She has a normal mood and affect. Her behavior is normal.    BP 136/74 (BP Location: Left Arm, Patient Position: Sitting, Cuff Size: Normal)   Pulse 77   Temp 97.7 F (36.5 C) (Oral)   Resp 16   Wt 151 lb 3.2 oz (68.6 kg)   SpO2 94%   BMI 26.78 kg/m  Wt Readings from Last 3 Encounters:  05/07/17 151 lb 3.2 oz (68.6 kg)  03/03/17 151 lb 9.6 oz (68.8 kg)  11/21/16 152 lb 12.8 oz (69.3 kg)     Lab Results  Component Value Date   WBC 10.3 02/27/2017   HGB 13.7 02/27/2017   HCT 42.0 02/27/2017   PLT 346.0 02/27/2017   GLUCOSE 120 (H) 02/27/2017   CHOL 190 02/27/2017   TRIG 125.0 02/27/2017   HDL 47.60 02/27/2017   LDLCALC 117 (H) 02/27/2017   ALT 7 02/27/2017   AST 12 02/27/2017   NA 141 02/27/2017   K 4.9 02/27/2017   CL 103 02/27/2017   CREATININE 0.99 02/27/2017   BUN 18 02/27/2017   CO2 32 02/27/2017   TSH 1.86 10/24/2016   HGBA1C 6.6 (H) 02/27/2017   MICROALBUR 4.5 (H) 10/24/2016    Mm Screening Breast Tomo Bilateral  Result Date: 09/18/2016 CLINICAL DATA:  Screening. EXAM: 2D DIGITAL SCREENING BILATERAL MAMMOGRAM WITH CAD AND ADJUNCT TOMO COMPARISON:  Previous exam(s). ACR Breast Density Category b: There are scattered areas of fibroglandular density. FINDINGS: There are no findings suspicious for malignancy. Images were processed with CAD. IMPRESSION: No mammographic evidence of malignancy. A result letter of this screening mammogram will be mailed directly to the patient. RECOMMENDATION: Screening mammogram in one year. (Code:SM-B-01Y) BI-RADS CATEGORY  1: Negative. Electronically Signed   By:  Ammie Ferrier M.D.   On: 09/18/2016 08:58       Assessment & Plan:   Problem List Items Addressed This Visit    CAD (coronary artery disease)    Followed by cardiology.  Currently without symptoms.  Continue risk factor modification.  Unable to take statin medication.       Dementia    On aricept.  Husband feels stable.        Diabetes mellitus with circulatory complication (HCC)    Low carb diet and exercise.  Follow met b  and a1c.        Relevant Orders   Hemoglobin F6O   Basic metabolic panel   Hypercholesteremia    Off statin medication secondary to intolerance as outlined.  Low cholesterol diet and exercise.  Follow lipid panel and liver function tests.        Relevant Orders   Hepatic function panel   Lipid panel   Hypothyroidism    On thyroid replacement.  Follow tsh.            Einar Pheasant, MD

## 2017-05-10 ENCOUNTER — Encounter: Payer: Self-pay | Admitting: Internal Medicine

## 2017-05-10 NOTE — Assessment & Plan Note (Signed)
Off statin medication secondary to intolerance as outlined.  Low cholesterol diet and exercise.  Follow lipid panel and liver function tests.

## 2017-05-10 NOTE — Assessment & Plan Note (Signed)
On thyroid replacement.  Follow tsh.  

## 2017-05-10 NOTE — Assessment & Plan Note (Signed)
On aricept.  Husband feels stable.

## 2017-05-10 NOTE — Assessment & Plan Note (Signed)
Followed by cardiology.  Currently without symptoms.  Continue risk factor modification.  Unable to take statin medication.

## 2017-05-10 NOTE — Assessment & Plan Note (Signed)
Low carb diet and exercise.  Follow met b and a1c.   

## 2017-05-29 ENCOUNTER — Ambulatory Visit (INDEPENDENT_AMBULATORY_CARE_PROVIDER_SITE_OTHER): Payer: Medicare Other

## 2017-05-29 VITALS — BP 134/72 | HR 78 | Temp 98.1°F | Resp 14 | Ht 63.0 in | Wt 151.8 lb

## 2017-05-29 DIAGNOSIS — Z0000 Encounter for general adult medical examination without abnormal findings: Secondary | ICD-10-CM

## 2017-05-29 NOTE — Progress Notes (Addendum)
Subjective:   Amanda [name] is a 84 y.o. female who presents for Medicare Annual (Subsequent) preventive examination.  Review of Systems:  No ROS.  Medicare Wellness Visit. Additional risk factors are reflected in the social history.  Cardiac Risk Factors include: advanced age (>90men, >54 women);hypertension     Objective:     Vitals: BP 134/72 (BP Location: Left Arm, Patient Position: Sitting, Cuff Size: Normal)   Pulse 78   Temp 98.1 F (36.7 C) (Oral)   Resp 14   Ht  (1.6 m)   Wt 151 lb 12.8 oz (68.9 kg)   SpO2 96%   BMI 26.89 kg/m   Body mass index is 26.89 kg/m.  Advanced Directives 05/29/2017 05/28/2016 05/24/2016  Does Patient Have a Medical Advance Directive? Yes Yes Yes  Type of Advance Directive Living will;Healthcare Power of [employer] Power of [place];Living will Healthcare Power of [place];Living will  Does patient want to make changes to medical advance directive? No - Patient declined No - Patient declined -  Copy of Healthcare Power of Attorney in Chart? No - copy requested No - copy requested -    Tobacco Social History   Tobacco Use  Smoking Status Never Smoker  Smokeless Tobacco Never Used     Counseling given: Not Answered   Clinical Intake:  Pre-visit preparation completed: Yes  Pain : No/denies pain     Nutritional Status: BMI 25 -29 Overweight Diabetes: Yes(Followed by PCP)  How often do you need to have someone help you when you read instructions, pamphlets, or other written materials from your doctor or pharmacy?: 3 - Sometimes  Interpreter Needed?: No     Past Medical History:  Diagnosis Date  . Carotid bruit    bilaterally  . Dementia   . Hypercholesterolemia   . Hypertension   . Hypothyroidism    Past Surgical History:  Procedure Laterality Date  . CARPAL TUNNEL RELEASE    . CHOLECYSTECTOMY  1991  . THORACIC AORTA STENT  12/13   heart stent   Family History  Problem Relation Age of Onset  .  Hypertension Father   . Lung cancer Father   . Hypertension Mother   . Dementia Mother   . Arthritis Unknown   . Breast cancer Cousin   . Colon cancer Neg Hx    Social History   Socioeconomic History  . Marital status: Married    Spouse name: Not on file  . Number of children: 2  . Years of education: Not on file  . Highest education level: Not on file  Occupational History  . Occupation: retired Data processing manager  . Financial resource strain: Not hard at all  . Food insecurity:    Worry: Never true    Inability: Never true  . Transportation needs:    Medical: No    Non-medical: No  Tobacco Use  . Smoking status: Never Smoker  . Smokeless tobacco: Never Used  Substance and Sexual Activity  . Alcohol use: No    Alcohol/week: 0.0 oz  . Drug use: No  . Sexual activity: Not on file  Lifestyle  . Physical activity:    Days per week: Not on file    Minutes per session: Not on file  . Stress: Not on file  Relationships  . Social connections:    Talks on phone: Not on file    Gets together: Not on file    Attends religious service: Not on file  Active member of club or organization: Not on file    Attends meetings of clubs or organizations: Not on file    Relationship status: Not on file  Other Topics Concern  . Not on file  Social History Narrative   She is an only child. 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    Outpatient Encounter Medications as of 05/29/2017  Medication Sig  . aspirin 325 MG EC tablet Take 325 mg by mouth daily.  Marland Kitchen donepezil (ARICEPT) 10 MG tablet TAKE ONE TABLET AT BEDTIME.  Marland Kitchen levothyroxine (SYNTHROID) 75 MCG tablet Take 0.5 tablets (37.5 mcg total) by mouth daily.  Marland Kitchen lisinopril (PRINIVIL,ZESTRIL) 20 MG tablet Take 1 tablet (20 mg total) by mouth daily.  . metoprolol succinate (TOPROL-XL) 25 MG 24 hr tablet TAKE ONE (1) TABLET BY MOUTH ONCE DAILY   No facility-administered encounter medications on file as  of 05/29/2017.     Activities of Daily Living In your present state of health, do you have any difficulty performing the following activities: 05/29/2017  Hearing? N  Vision? N  Difficulty concentrating or making decisions? Y  Comment Dementia  Walking or climbing stairs? N  Dressing or bathing? N  Doing errands, shopping? Y  Comment She does not Engineer, manufacturing and eating ? N  Using the Toilet? N  In the past six months, have you accidently leaked urine? N  Do you have problems with loss of bowel control? N  Managing your Medications? Y  Comment Husband assists as needed  Managing your Finances? Y  Comment Husband assists  Housekeeping or managing your Housekeeping? Y  Comment Husband assists as needed  Some recent data might be hidden    Patient Care Team: Dale [place], MD as PCP - General (Internal Medicine)    Assessment:   This is a routine wellness examination for Amanda [name]. The goal of the wellness visit is to assist the patient how to close the gaps in care and create a preventative care plan for the patient.   The roster of all physicians providing medical care to patient is listed in the Snapshot section of the chart.  Osteoporosis risk reviewed.    Safety issues reviewed; Smoke and carbon monoxide detectors in the home. No firearms in the home. Wears seatbelts when riding with others. No violence in the home.  They do not have excessive sun exposure.  Discussed the need for sun protection: hats, long sleeves and the use of sunscreen if there is significant sun exposure.  BMI- discussed the importance of a healthy diet, water intake and the benefits of aerobic exercise. Educational material provided.   24 hour diet recall: Regular diet  Dental- every 6 months.  Eye- Visual acuity not assessed per patient preference since they have regular follow up with the ophthalmologist.  Wears corrective lenses.  Sleep patterns- Sleeps through the night without  issues.    Patient Concerns: None at this time. Follow up with PCP as needed.  Exercise Activities and Dietary recommendations Current Exercise Habits: Home exercise routine, Type of exercise: stretching;walking, Time (Minutes): 20, Frequency (Times/Week): 4, Weekly Exercise (Minutes/Week): 80, Intensity: Mild  Goals    . INCREASE WATER INTAKE     Stay hydrated       Fall Risk Fall Risk  05/29/2017 05/28/2016 12/28/2015 12/27/2014 03/16/2014  Falls in the past year? No No No Yes No  Number falls in past yr: - - - 1 -  Injury with Fall? - - - No -   Depression Screen PHQ 2/9 Scores 05/29/2017 05/28/2016 12/28/2015 12/27/2014  PHQ - 2 Score 0 0 0 0  PHQ- 9 Score - 0 - -     Cognitive Function MMSE - Mini Mental State Exam 05/29/2017  Not completed: Unable to complete     6CIT Screen 05/28/2016  What Year? 4 points  What month? 3 points  What time? 0 points  Count back from 20 0 points  Months in reverse 0 points  Repeat phrase 10 points  Total Score 17    Immunization History  Administered Date(s) Administered  . Influenza, High Dose Seasonal PF 10/27/2016  . Influenza,inj,Quad PF,6+ Mos 11/12/2012, 09/21/2013, 09/26/2014  . Influenza-Unspecified 12/02/2006, 11/04/2007, 11/13/2008, 11/28/2015  . Pneumococcal Conjugate-13 05/17/2013  . Pneumococcal Polysaccharide-23 05/07/2016    Screening Tests Health Maintenance  Topic Date Due  . FOOT EXAM  05/09/1945  . OPHTHALMOLOGY EXAM  05/09/1945  . TETANUS/TDAP  05/10/1954  . DEXA SCAN  05/09/2000  . INFLUENZA VACCINE  08/27/2017  . HEMOGLOBIN A1C  08/27/2017  . MAMMOGRAM  09/17/2017  . PNA vac Low Risk Adult  Completed      Plan:    End of life planning; Advance aging; Advanced directives discussed. Copy of current HCPOA/Living Will requested.    I have personally reviewed and noted the following in the patient's chart:   . Medical and social history . Use of alcohol, tobacco or illicit drugs  . Current medications and  supplements . Functional ability and status . Nutritional status . Physical activity . Advanced directives . List of other physicians . Hospitalizations, surgeries, and ER visits in previous 12 months . Vitals . Screenings to include cognitive, depression, and falls . Referrals and appointments  In addition, I have reviewed and discussed with patient certain preventive protocols, quality metrics, and best practice recommendations. A written personalized care plan for preventive services as well as general preventive health recommendations were provided to patient.     Ashok Pall, LPN  02/01/1094   Reviewed above information.  Agree with assessment and plan.    Dr Lorin Picket

## 2017-05-29 NOTE — Patient Instructions (Addendum)
  Ms. Viramontes , Thank you for taking time to come for your Medicare Wellness Visit. I appreciate your ongoing commitment to your health goals. Please review the following plan we discussed and let me know if I can assist you in the future.    These are the goals we discussed: Goals    . INCREASE WATER INTAKE     Stay hydrated       This is a list of the screening recommended for you and due dates:  Health Maintenance  Topic Date Due  . Complete foot exam   05/09/1945  . Eye exam for diabetics  05/09/1945  . Tetanus Vaccine  05/10/1954  . DEXA scan (bone density measurement)  05/09/2000  . Flu Shot  08/27/2017  . Hemoglobin A1C  08/27/2017  . Mammogram  09/17/2017  . Pneumonia vaccines  Completed

## 2017-08-11 ENCOUNTER — Other Ambulatory Visit: Payer: Self-pay | Admitting: Internal Medicine

## 2017-08-11 DIAGNOSIS — Z1231 Encounter for screening mammogram for malignant neoplasm of breast: Secondary | ICD-10-CM

## 2017-09-09 ENCOUNTER — Other Ambulatory Visit (INDEPENDENT_AMBULATORY_CARE_PROVIDER_SITE_OTHER): Payer: Medicare Other

## 2017-09-09 DIAGNOSIS — E1159 Type 2 diabetes mellitus with other circulatory complications: Secondary | ICD-10-CM | POA: Diagnosis not present

## 2017-09-09 DIAGNOSIS — E7800 Pure hypercholesterolemia, unspecified: Secondary | ICD-10-CM | POA: Diagnosis not present

## 2017-09-09 LAB — BASIC METABOLIC PANEL
BUN: 15 mg/dL (ref 6–23)
CHLORIDE: 105 meq/L (ref 96–112)
CO2: 30 meq/L (ref 19–32)
Calcium: 10.1 mg/dL (ref 8.4–10.5)
Creatinine, Ser: 1.04 mg/dL (ref 0.40–1.20)
GFR: 53.88 mL/min — ABNORMAL LOW (ref >60.00)
Glucose, Bld: 108 mg/dL — ABNORMAL HIGH (ref 70–99)
Potassium: 4.5 mEq/L (ref 3.5–5.1)
SODIUM: 141 meq/L (ref 135–145)

## 2017-09-09 LAB — LIPID PANEL
CHOL/HDL RATIO: 6
Cholesterol: 220 mg/dL — ABNORMAL HIGH (ref 0–200)
HDL: 38.3 mg/dL — AB (ref >39.00)
LDL CALC: 144 mg/dL — AB (ref 0–99)
NonHDL: 181.21
TRIGLYCERIDES: 184 mg/dL — AB (ref 0.0–149.0)
VLDL: 36.8 mg/dL (ref 0.0–40.0)

## 2017-09-09 LAB — HEPATIC FUNCTION PANEL
ALBUMIN: 3.9 g/dL (ref 3.5–5.2)
ALT: 11 U/L (ref 0–35)
AST: 16 U/L (ref 0–37)
Alkaline Phosphatase: 74 U/L (ref 39–117)
Bilirubin, Direct: 0.1 mg/dL (ref 0.0–0.3)
TOTAL PROTEIN: 7.1 g/dL (ref 6.0–8.3)
Total Bilirubin: 0.5 mg/dL (ref 0.2–1.2)

## 2017-09-09 LAB — HEMOGLOBIN A1C: Hgb A1c MFr Bld: 6.6 % — ABNORMAL HIGH (ref 4.6–6.5)

## 2017-09-11 ENCOUNTER — Ambulatory Visit: Payer: Medicare Other | Admitting: Internal Medicine

## 2017-09-11 ENCOUNTER — Encounter: Payer: Self-pay | Admitting: Internal Medicine

## 2017-09-11 DIAGNOSIS — E1159 Type 2 diabetes mellitus with other circulatory complications: Secondary | ICD-10-CM | POA: Diagnosis not present

## 2017-09-11 DIAGNOSIS — F0390 Unspecified dementia without behavioral disturbance: Secondary | ICD-10-CM

## 2017-09-11 DIAGNOSIS — E039 Hypothyroidism, unspecified: Secondary | ICD-10-CM

## 2017-09-11 DIAGNOSIS — I2510 Atherosclerotic heart disease of native coronary artery without angina pectoris: Secondary | ICD-10-CM | POA: Diagnosis not present

## 2017-09-11 DIAGNOSIS — E7800 Pure hypercholesterolemia, unspecified: Secondary | ICD-10-CM | POA: Diagnosis not present

## 2017-09-11 DIAGNOSIS — R0989 Other specified symptoms and signs involving the circulatory and respiratory systems: Secondary | ICD-10-CM

## 2017-09-11 NOTE — Progress Notes (Signed)
Patient ID: Amanda [name], female   DOB: 03-Mar-1933, 84 y.o.   MRN: [id number]   Subjective:    Patient ID: Amanda [name], female    DOB: [birth year], 84 y.o.   MRN: [id number]  HPI  Patient here for a scheduled follow up.  She is accompanied by her husband.  History obtained from both of them.  She reports she feels good.  Trying to stay active.  No chest pain.  No sob.  No acid reflux.  No abdominal pain.  Bowels moving.  No urine change.  Memory stable.  Had significant side effects with statin medications.  Recent LDL increased.  Discussed cholesterol risk and cholesterol treatment.  Unable to take statin medication.  Discussed other options.  Discussed zetia.     Past Medical History:  Diagnosis Date  . Carotid bruit    bilaterally  . Dementia   . Hypercholesterolemia   . Hypertension   . Hypothyroidism    Past Surgical History:  Procedure Laterality Date  . CARPAL TUNNEL RELEASE    . CHOLECYSTECTOMY  1991  . THORACIC AORTA STENT  12/13   heart stent   Family History  Problem Relation Age of Onset  . Hypertension Father   . Lung cancer Father   . Hypertension Mother   . Dementia Mother   . Arthritis Unknown   . Breast cancer Cousin   . Colon cancer Neg Hx    Social History   Socioeconomic History  . Marital status: Married    Spouse name: Not on file  . Number of children: 2  . Years of education: Not on file  . Highest education level: Not on file  Occupational History  . Occupation: retired Tour manager  . Financial resource strain: Not hard at all  . Food insecurity:    Worry: Never true    Inability: Never true  . Transportation needs:    Medical: No    Non-medical: No  Tobacco Use  . Smoking status: Never Smoker  . Smokeless tobacco: Never Used  Substance and Sexual Activity  . Alcohol use: No    Alcohol/week: 0.0 standard drinks  . Drug use: No  . Sexual activity: Not on file  Lifestyle  . Physical activity:    Days per week: Not on  file    Minutes per session: Not on file  . Stress: Not on file  Relationships  . Social connections:    Talks on phone: Not on file    Gets together: Not on file    Attends religious service: Not on file    Active member of club or organization: Not on file    Attends meetings of clubs or organizations: Not on file    Relationship status: Not on file  Other Topics Concern  . Not on file  Social History Narrative   She is an only child. 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    Outpatient Encounter Medications as of 09/11/2017  Medication Sig  . aspirin 325 MG EC tablet Take 325 mg by mouth daily.  Marland Kitchen donepezil (ARICEPT) 10 MG tablet TAKE ONE TABLET AT BEDTIME.  Marland Kitchen levothyroxine (SYNTHROID) 75 MCG tablet Take 0.5 tablets (37.5 mcg total) by mouth daily.  Marland Kitchen lisinopril (PRINIVIL,ZESTRIL) 20 MG tablet Take 1 tablet (20 mg total) by mouth daily.  . metoprolol succinate (TOPROL-XL) 25 MG 24 hr tablet TAKE ONE (1) TABLET BY MOUTH ONCE DAILY  No facility-administered encounter medications on file as of 09/11/2017.     Review of Systems  Constitutional: Negative for appetite change and unexpected weight change.  HENT: Negative for congestion and sinus pressure.   Respiratory: Negative for cough, chest tightness and shortness of breath.   Cardiovascular: Negative for chest pain, palpitations and leg swelling.  Gastrointestinal: Negative for abdominal pain, diarrhea, nausea and vomiting.  Genitourinary: Negative for difficulty urinating and dysuria.  Musculoskeletal: Negative for joint swelling and myalgias.  Skin: Negative for color change and rash.  Neurological: Negative for dizziness, light-headedness and headaches.  Psychiatric/Behavioral: Negative for agitation and dysphoric mood.       Objective:    Physical Exam  Constitutional: She appears well-developed and well-nourished. No distress.  HENT:  Nose: Nose normal.  Mouth/Throat: Oropharynx  is clear and moist.  Neck: Neck supple. No thyromegaly present.  Cardiovascular: Normal rate and regular rhythm.  Aortic bruit.    Pulmonary/Chest: Breath sounds normal. No respiratory distress. She has no wheezes.  Abdominal: Soft. Bowel sounds are normal. There is no tenderness.  Musculoskeletal: She exhibits no edema or tenderness.  Lymphadenopathy:    She has no cervical adenopathy.  Skin: No rash noted. No erythema.  Psychiatric: She has a normal mood and affect. Her behavior is normal.    BP (!) 142/80   Pulse 72   Temp 98.2 F (36.8 C) (Oral)   Ht '5\' 3"'$  (1.6 m)   Wt 154 lb 3.2 oz (69.9 kg)   SpO2 96%   BMI 27.32 kg/m  Wt Readings from Last 3 Encounters:  09/11/17 154 lb 3.2 oz (69.9 kg)  05/29/17 151 lb 12.8 oz (68.9 kg)  05/07/17 151 lb 3.2 oz (68.6 kg)     Lab Results  Component Value Date   WBC 10.3 02/27/2017   HGB 13.7 02/27/2017   HCT 42.0 02/27/2017   PLT 346.0 02/27/2017   GLUCOSE 108 (H) 09/09/2017   CHOL 220 (H) 09/09/2017   TRIG 184.0 (H) 09/09/2017   HDL 38.30 (L) 09/09/2017   LDLCALC 144 (H) 09/09/2017   ALT 11 09/09/2017   AST 16 09/09/2017   NA 141 09/09/2017   K 4.5 09/09/2017   CL 105 09/09/2017   CREATININE 1.04 09/09/2017   BUN 15 09/09/2017   CO2 30 09/09/2017   TSH 1.86 10/24/2016   HGBA1C 6.6 (H) 09/09/2017   MICROALBUR 4.5 (H) 10/24/2016    Mm Screening Breast Tomo Bilateral  Result Date: 09/18/2016 CLINICAL DATA:  Screening. EXAM: 2D DIGITAL SCREENING BILATERAL MAMMOGRAM WITH CAD AND ADJUNCT TOMO COMPARISON:  Previous exam(s). ACR Breast Density Category b: There are scattered areas of fibroglandular density. FINDINGS: There are no findings suspicious for malignancy. Images were processed with CAD. IMPRESSION: No mammographic evidence of malignancy. A result letter of this screening mammogram will be mailed directly to the patient. RECOMMENDATION: Screening mammogram in one year. (Code:SM-B-01Y) BI-RADS CATEGORY  1: Negative.  Electronically Signed   By: Ammie Ferrier M.D.   On: 09/18/2016 08:58       Assessment & Plan:   Problem List Items Addressed This Visit    Abdominal bruit    Schedule aortic ultrasound.        Relevant Orders   US Aorta   CAD (coronary artery disease)    Continue risk factor modifications.  Currently without symptoms.  Sees cardiology.        Dementia    On aricept.  Stable.        Diabetes  mellitus with circulatory complication (HCC)    Low carb diet and exercise.  Follow met b and a1c.        Relevant Orders   Hemoglobin K3C   Basic metabolic panel   Hypercholesteremia    Low cholesterol diet and exercise.  Had intolerance to statin medication.  Discussed other treatment options.  Discussed zetia.  Wants to work on diet and exercise and hold on any further treatment at this time.  Follow lipid panel.        Relevant Orders   Lipid panel   Hepatic function panel   Hypothyroidism    On thyroid replacement.  Follow tsh.        Relevant Orders   TSH       Einar Pheasant, MD

## 2017-09-14 ENCOUNTER — Telehealth: Payer: Self-pay

## 2017-09-14 DIAGNOSIS — R0989 Other specified symptoms and signs involving the circulatory and respiratory systems: Secondary | ICD-10-CM

## 2017-09-14 NOTE — Assessment & Plan Note (Signed)
Low cholesterol diet and exercise.  Had intolerance to statin medication.  Discussed other treatment options.  Discussed zetia.  Wants to work on diet and exercise and hold on any further treatment at this time.  Follow lipid panel.

## 2017-09-14 NOTE — Assessment & Plan Note (Signed)
On thyroid replacement.  Follow tsh.  

## 2017-09-14 NOTE — Assessment & Plan Note (Signed)
Low carb diet and exercise.  Follow met b and a1c.   

## 2017-09-14 NOTE — Assessment & Plan Note (Signed)
Schedule aortic ultrasound.   

## 2017-09-14 NOTE — Assessment & Plan Note (Signed)
Continue risk factor modifications.  Currently without symptoms.  Sees cardiology.

## 2017-09-14 NOTE — Assessment & Plan Note (Signed)
On aricept.  Stable.

## 2017-09-21 ENCOUNTER — Ambulatory Visit
Admission: RE | Admit: 2017-09-21 | Discharge: 2017-09-21 | Disposition: A | Discharge status: Home / Self Care | Payer: Medicare Other | Source: Ambulatory Visit | Attending: Internal Medicine | Admitting: Internal Medicine

## 2017-09-21 DIAGNOSIS — R0989 Other specified symptoms and signs involving the circulatory and respiratory systems: Secondary | ICD-10-CM | POA: Diagnosis not present

## 2017-09-21 DIAGNOSIS — Z1231 Encounter for screening mammogram for malignant neoplasm of breast: Secondary | ICD-10-CM | POA: Insufficient documentation

## 2017-09-22 ENCOUNTER — Ambulatory Visit
Admission: RE | Admit: 2017-09-22 | Discharge: 2017-09-22 | Disposition: A | Discharge status: Home / Self Care | Payer: Medicare Other | Source: Ambulatory Visit | Attending: Internal Medicine | Admitting: Internal Medicine

## 2017-09-22 DIAGNOSIS — R0989 Other specified symptoms and signs involving the circulatory and respiratory systems: Secondary | ICD-10-CM

## 2017-10-27 ENCOUNTER — Emergency Department
Admission: EM | Admit: 2017-10-27 | Discharge: 2017-10-27 | Disposition: A | Discharge status: Home / Self Care | Payer: Medicare Other | Attending: Emergency Medicine | Admitting: Emergency Medicine

## 2017-10-27 ENCOUNTER — Encounter: Payer: Self-pay | Admitting: Emergency Medicine

## 2017-10-27 ENCOUNTER — Emergency Department: Payer: Medicare Other

## 2017-10-27 ENCOUNTER — Other Ambulatory Visit: Payer: Self-pay

## 2017-10-27 DIAGNOSIS — E039 Hypothyroidism, unspecified: Secondary | ICD-10-CM | POA: Diagnosis not present

## 2017-10-27 DIAGNOSIS — R202 Paresthesia of skin: Secondary | ICD-10-CM

## 2017-10-27 DIAGNOSIS — E1159 Type 2 diabetes mellitus with other circulatory complications: Secondary | ICD-10-CM | POA: Diagnosis not present

## 2017-10-27 DIAGNOSIS — F0280 Dementia in other diseases classified elsewhere without behavioral disturbance: Secondary | ICD-10-CM | POA: Diagnosis not present

## 2017-10-27 DIAGNOSIS — G309 Alzheimer's disease, unspecified: Secondary | ICD-10-CM | POA: Insufficient documentation

## 2017-10-27 DIAGNOSIS — Z79899 Other long term (current) drug therapy: Secondary | ICD-10-CM | POA: Diagnosis not present

## 2017-10-27 DIAGNOSIS — I10 Essential (primary) hypertension: Secondary | ICD-10-CM | POA: Insufficient documentation

## 2017-10-27 DIAGNOSIS — Z7982 Long term (current) use of aspirin: Secondary | ICD-10-CM | POA: Insufficient documentation

## 2017-10-27 LAB — COMPREHENSIVE METABOLIC PANEL
ALBUMIN: 4.1 g/dL (ref 3.5–5.0)
ALT: 11 U/L (ref 0–44)
AST: 22 U/L (ref 15–41)
Alkaline Phosphatase: 78 U/L (ref 38–126)
Anion gap: 8 (ref 5–15)
BUN: 21 mg/dL (ref 8–23)
CHLORIDE: 104 mmol/L (ref 98–111)
CO2: 27 mmol/L (ref 22–32)
CREATININE: 0.89 mg/dL (ref 0.44–1.00)
Calcium: 9.5 mg/dL (ref 8.9–10.3)
GFR calc Af Amer: >60 mL/min (ref >60)
GFR calc non Af Amer: 59 mL/min — ABNORMAL LOW (ref >60)
Glucose, Bld: 148 mg/dL — ABNORMAL HIGH (ref 70–99)
POTASSIUM: 4.1 mmol/L (ref 3.5–5.1)
Sodium: 139 mmol/L (ref 135–145)
Total Bilirubin: 0.5 mg/dL (ref 0.3–1.2)
Total Protein: 7.4 g/dL (ref 6.5–8.1)

## 2017-10-27 LAB — URINALYSIS, COMPLETE (UACMP) WITH MICROSCOPIC
Bacteria, UA: NONE SEEN
Bilirubin Urine: NEGATIVE
GLUCOSE, UA: NEGATIVE mg/dL
Hgb urine dipstick: NEGATIVE
Ketones, ur: NEGATIVE mg/dL
Leukocytes, UA: NEGATIVE
Nitrite: NEGATIVE
PH: 8 (ref 5.0–8.0)
Protein, ur: NEGATIVE mg/dL
SPECIFIC GRAVITY, URINE: 1.015 (ref 1.005–1.030)

## 2017-10-27 LAB — CBC WITH DIFFERENTIAL/PLATELET
BASOS ABS: 0.1 10*3/uL (ref 0–0.1)
Basophils Relative: 1 %
EOS PCT: 1 %
Eosinophils Absolute: 0 10*3/uL (ref 0–0.7)
HEMATOCRIT: 41.2 % (ref 35.0–47.0)
Hemoglobin: 14.2 g/dL (ref 12.0–16.0)
LYMPHS ABS: 1.1 10*3/uL (ref 1.0–3.6)
LYMPHS PCT: 12 %
MCH: 29.7 pg (ref 26.0–34.0)
MCHC: 34.6 g/dL (ref 32.0–36.0)
MCV: 86.1 fL (ref 80.0–100.0)
MONO ABS: 0.7 10*3/uL (ref 0.2–0.9)
Monocytes Relative: 8 %
NEUTROS ABS: 6.9 10*3/uL — AB (ref 1.4–6.5)
Neutrophils Relative %: 78 %
PLATELETS: 270 10*3/uL (ref 150–440)
RBC: 4.79 MIL/uL (ref 3.80–5.20)
RDW: 14.2 % (ref 11.5–14.5)
WBC: 8.8 10*3/uL (ref 3.6–11.0)

## 2017-10-27 LAB — TROPONIN I: Troponin I: <0.03 ng/mL (ref <0.03)

## 2017-10-27 LAB — MAGNESIUM: MAGNESIUM: 2.3 mg/dL (ref 1.7–2.4)

## 2017-10-27 LAB — TSH: TSH: 1.561 u[IU]/mL (ref 0.350–4.500)

## 2017-10-27 NOTE — ED Notes (Signed)
Pt walked to nurses station and back with little to no assistance.  Gait steady.  MD made aware.

## 2017-10-27 NOTE — ED Notes (Signed)
To CT Scan

## 2017-10-27 NOTE — ED Provider Notes (Addendum)
[facility] Emergency Department Provider Note  ____________________________________________   I have reviewed the triage vital signs and the nursing notes. Where available I have reviewed prior notes and, if possible and indicated, outside hospital notes.    HISTORY  Chief Complaint Tingling    HPI Amanda [name] is a 84 y.o. female  Who presents today complaining of tingling over her entire body, both sides, initially was her whole body, and then it was just her hands and feet, now just the tips of her toes bilaterally.  Patient does suffer from Alzheimer's she is at her baseline.  No other neurologic complaints could complains of.  Patient has been apparently complaining of tingling since yesterday but is almost gone now.  No close head injury no neck injury, no difficulty speaking, family states that that she seemed to be a little unsteady on her feet but is back to baseline now.  No fever no chills no abdominal pain no difficulty speaking no other complaints.  No neck pain no trauma no fall     Past Medical History:  Diagnosis Date  . Carotid bruit    bilaterally  . Dementia ([place])   . Hypercholesterolemia   . Hypertension   . Hypothyroidism     Patient Active Problem List   Diagnosis Date Noted  . Abdominal bruit 09/14/2017  . Muscle ache 03/06/2017  . Fall 12/31/2014  . Health care maintenance 05/20/2014  . Diabetes mellitus with circulatory complication ([place]) 16/83/7290  . CAD (coronary artery disease) 05/15/2012  . Osteopenia 12/14/2011  . Hypercholesteremia 11/13/2011  . Dementia ([place]) 11/13/2011  . Hypothyroidism 11/13/2011    Past Surgical History:  Procedure Laterality Date  . CARPAL TUNNEL RELEASE    . CHOLECYSTECTOMY  1991  . THORACIC AORTA STENT  12/13   heart stent    Prior to Admission medications   Medication Sig Start Date End Date Taking? Authorizing Provider  aspirin 325 MG EC tablet Take 325 mg by mouth daily.     [provider]  donepezil (ARICEPT) 10 MG tablet TAKE ONE TABLET AT BEDTIME. 10/27/16   Einar Pheasant, MD  levothyroxine (SYNTHROID) 75 MCG tablet Take 0.5 tablets (37.5 mcg total) by mouth daily. 10/27/16   Einar Pheasant, MD  lisinopril (PRINIVIL,ZESTRIL) 20 MG tablet Take 1 tablet (20 mg total) by mouth daily. 10/27/16   Einar Pheasant, MD  metoprolol succinate (TOPROL-XL) 25 MG 24 hr tablet TAKE ONE (1) TABLET BY MOUTH ONCE DAILY 10/27/16   Einar Pheasant, MD    Allergies Sulfa antibiotics  Family History  Problem Relation Age of Onset  . Hypertension Father   . Lung cancer Father   . Hypertension Mother   . Dementia Mother   . Arthritis Unknown   . Breast cancer Cousin        mat cousin  . Colon cancer Neg Hx     Social History Social History   Tobacco Use  . Smoking status: Never Smoker  . Smokeless tobacco: Never Used  Substance Use Topics  . Alcohol use: No    Alcohol/week: 0.0 standard drinks  . Drug use: No    Review of Systems Constitutional: No fever/chills Eyes: No visual changes. ENT: No sore throat. No stiff neck no neck pain Cardiovascular: Denies chest pain. Respiratory: Denies shortness of breath. Gastrointestinal:   no vomiting.  No diarrhea.  No constipation. Genitourinary: Negative for dysuria. Musculoskeletal: Negative lower extremity swelling Skin: Negative for rash. Neurological: Negative for severe headaches, focal  weakness or numbness.   ____________________________________________   PHYSICAL EXAM:  VITAL SIGNS: ED Triage Vitals  Enc Vitals Group     BP 10/27/17 1039 (!) 144/57     Pulse Rate 10/27/17 1039 79     Resp 10/27/17 1039 16     Temp 10/27/17 1039 (!) 97.3 F (36.3 C)     Temp Source 10/27/17 1039 Oral     SpO2 10/27/17 1039 99 %     Weight 10/27/17 1038 153 lb 14.1 oz (69.8 kg)     Height 10/27/17 1038 5' 6" (1.676 m)     Head Circumference --      Peak Flow --      Pain Score --      Pain Loc --       Pain Edu? --      Excl. in [place]? --     Constitutional: Alert and oriented to name and place unsure of the date, at her baseline. Well appearing and in no acute distress. Eyes: Conjunctivae are normal Head: Atraumatic HEENT: No congestion/rhinnorhea. Mucous membranes are moist.  Oropharynx non-erythematous Neck:   Nontender with no meningismus, no masses, no stridor Cardiovascular: Normal rate, regular rhythm. Grossly normal heart sounds.  Good peripheral circulation. Respiratory: Normal respiratory effort.  No retractions. Lungs CTAB. Abdominal: Soft and nontender. No distention. No guarding no rebound Back:  There is no focal tenderness or step off.  there is no midline tenderness there are no lesions noted. there is no CVA tenderness Musculoskeletal: No lower extremity tenderness, no upper extremity tenderness. No joint effusions, no DVT signs strong distal pulses no edema Neurologic: Cranial nerves II through XII are grossly intact 5 out of 5 strength bilateral upper and lower extremity. Finger to nose within normal limits heel to shin within normal limits, speech is normal with no word finding difficulty or dysarthria, reflexes symmetric, pupils are equally round and reactive to light, there is no pronator drift, sensation is normal, vision is intact to confrontation, gait is within normal limits, there is no nystagmus, normal neurologic exam Skin:  Skin is warm, dry and intact. No rash noted. Psychiatric: Mood and affect are normal. Speech and behavior are normal.  ____________________________________________   LABS (all labs ordered are listed, but only abnormal results are displayed)  Labs Reviewed  COMPREHENSIVE METABOLIC PANEL - Abnormal; Notable for the following components:      Result Value   Glucose, Bld 148 (*)    GFR calc non Af Amer 59 (*)    All other components within normal limits  CBC WITH DIFFERENTIAL/PLATELET - Abnormal; Notable for the following components:    Neutro Abs 6.9 (*)    All other components within normal limits  MAGNESIUM  TROPONIN I  TSH  URINALYSIS, COMPLETE (UACMP) WITH MICROSCOPIC    Pertinent labs  results that were available during my care of the patient were reviewed by me and considered in my medical decision making (see chart for details). ____________________________________________  EKG  I personally interpreted any EKGs ordered by me or triage Normal sinus rhythm rate 71 bpm no acute ST elevation or depression, normal axis unremarkable EKG ____________________________________________  RADIOLOGY  Pertinent labs & imaging results that were available during my care of the patient were reviewed by me and considered in my medical decision making (see chart for details). If possible, patient and/or family made aware of any abnormal findings.  Dg Chest 2 View  Result Date: 10/27/2017 CLINICAL DATA:  Bilateral hand tingling.  EXAM: CHEST - 2 VIEW COMPARISON:  11/04/2011. FINDINGS: Mediastinum hilar structures normal. Lungs are clear. No pleural effusion or pneumothorax. Heart size normal. No acute bony abnormality. Degenerative change thoracic spine. IMPRESSION: No acute cardiopulmonary disease. Electronically Signed   By: Marcello Moores  Register   On: 10/27/2017 12:00   Ct Head Wo Contrast  Result Date: 10/27/2017 CLINICAL DATA:  Tingling of both hands beginning when patient woke up this morning, baseline dementia, grip strength equal, gait abnormalities, history hypertension EXAM: CT HEAD WITHOUT CONTRAST TECHNIQUE: Contiguous axial images were obtained from the base of the skull through the vertex without intravenous contrast. Sagittal and coronal MPR images reconstructed from axial data set. COMPARISON:  11/04/2011 FINDINGS: Brain: Generalized atrophy. Normal ventricular morphology. No midline shift or mass effect. Otherwise normal appearance of brain parenchyma. No intracranial hemorrhage, mass lesion or evidence acute infarction.  No extra-axial fluid collections. Vascular: Mild atherosclerotic calcification of internal carotid and vertebral arteries at skull base Skull: Intact Sinuses/Orbits: Clear Other: N/A IMPRESSION: Age-related atrophy. No acute intracranial abnormalities. Electronically Signed   By: Lavonia Dana M.D.   On: 10/27/2017 11:48   ____________________________________________    PROCEDURES  Procedure(s) performed: None  Procedures  Critical Care performed: None  ____________________________________________   INITIAL IMPRESSION / ASSESSMENT AND PLAN / ED COURSE  Pertinent labs & imaging results that were available during my care of the patient were reviewed by me and considered in my medical decision making (see chart for details).  She is here with tingling diffusely including her toes and hands and whole body, neurologically intact with no symptoms at this time did have some tingling at the of her toes bilaterally when I met her but that is completely resolved and she walks with a normal gait.  Because of her history of Alzheimer's and some question about the history I did a CT scan which is negative, she has no evidence of acute spinal stenosis she has a normal neurologic exam.  Spinal cord injury would not usually get better completely.  She has reassuring vital signs and blood work.  I am going to talk to neurology about what else we would be able to do for this patient.  There is nothing to indicate that the tingling in her toes is ACS PE or dissection, there is nothing to indicate that she is had a vascular catastrophe or other event of that variety.  There is no indication on her NIH stroke scale that she has had a stroke.     ----------------------------------------- 2:16 PM on 10/27/2017 ----------------------------------------- Patient remains a symptomatic with a normal gait, family feels that she is 100% back to her baseline.  I talked to Dr. Doy Mince, he feels very unlikely this was a  CVA, she does suggest that if there is ongoing family concern a MRI could be obtained prior to discharge.  I discussed this with the family did offer and even recommend an MRI to be definitive given her somewhat atypical symptoms, but they refused.  She is already taking an aspirin every day.  They do not want to stay longer in the emergency room for an MRI.  She feels fine and is eager to go and does not want to go on the machine again she states.  And family are very careful taking her home.  Given that they declined an MRI at this time, we will discharge them with close outpatient follow-up and return precautions given and understood.  He is neurologically intact at discharge.  ____________________________________________  FINAL CLINICAL IMPRESSION(S) / ED DIAGNOSES  Final diagnoses:  None      This chart was dictated using voice recognition software.  Despite best efforts to proofread,  errors can occur which can change meaning.      Schuyler Amor, MD 10/27/17 1417    Schuyler Amor, MD 10/27/17 [phone]

## 2017-10-27 NOTE — Discharge Instructions (Addendum)
It is unclear why you had this tingling, if you have tingling again, or if you have, of more concern, numbness, weakness, difficulty talking or speaking difficulty walking or other neurologic or other concerns, return to the emergency room.  Please follow closely with primary care in the next couple days.  We have offered you an MRI in the emergency department you would prefer to go home which is not unreasonable but it does limit a little bit our work-up here, as we discussed.  Your CT scan was negative.  Continue taking her medications and return to [place] if you have any concerns

## 2017-10-27 NOTE — ED Triage Notes (Addendum)
PT arrived with complaints of tingling of left and right hand beginning per pt when she woke up. Husband reports to ems the symptoms started 2 days ago  CBG 230 for ems. PT had dementia at baseline. Husband not currently at bedside. Pt denies chest pain or shortness of breath. Grips equal. No acute neuro deficits present in triage process.

## 2018-01-13 ENCOUNTER — Other Ambulatory Visit (INDEPENDENT_AMBULATORY_CARE_PROVIDER_SITE_OTHER): Payer: Medicare Other

## 2018-01-13 DIAGNOSIS — E1159 Type 2 diabetes mellitus with other circulatory complications: Secondary | ICD-10-CM

## 2018-01-13 DIAGNOSIS — E7800 Pure hypercholesterolemia, unspecified: Secondary | ICD-10-CM

## 2018-01-13 DIAGNOSIS — E039 Hypothyroidism, unspecified: Secondary | ICD-10-CM | POA: Diagnosis not present

## 2018-01-13 LAB — BASIC METABOLIC PANEL
BUN: 19 mg/dL (ref 6–23)
CALCIUM: 9.5 mg/dL (ref 8.4–10.5)
CO2: 29 mEq/L (ref 19–32)
Chloride: 106 mEq/L (ref 96–112)
Creatinine, Ser: 1.1 mg/dL (ref 0.40–1.20)
GFR: 50.46 mL/min — AB (ref >60.00)
Glucose, Bld: 115 mg/dL — ABNORMAL HIGH (ref 70–99)
Potassium: 4.8 mEq/L (ref 3.5–5.1)
Sodium: 140 mEq/L (ref 135–145)

## 2018-01-13 LAB — LIPID PANEL
CHOL/HDL RATIO: 6
Cholesterol: 226 mg/dL — ABNORMAL HIGH (ref 0–200)
HDL: 38.2 mg/dL — AB (ref >39.00)
LDL CALC: 162 mg/dL — AB (ref 0–99)
NonHDL: 187.61
TRIGLYCERIDES: 126 mg/dL (ref 0.0–149.0)
VLDL: 25.2 mg/dL (ref 0.0–40.0)

## 2018-01-13 LAB — HEPATIC FUNCTION PANEL
ALT: 10 U/L (ref 0–35)
AST: 14 U/L (ref 0–37)
Albumin: 3.9 g/dL (ref 3.5–5.2)
Alkaline Phosphatase: 75 U/L (ref 39–117)
BILIRUBIN DIRECT: 0 mg/dL (ref 0.0–0.3)
BILIRUBIN TOTAL: 0.4 mg/dL (ref 0.2–1.2)
Total Protein: 6.9 g/dL (ref 6.0–8.3)

## 2018-01-13 LAB — TSH: TSH: 4.75 u[IU]/mL — ABNORMAL HIGH (ref 0.35–4.50)

## 2018-01-13 LAB — HEMOGLOBIN A1C: Hgb A1c MFr Bld: 6.5 % (ref 4.6–6.5)

## 2018-01-15 ENCOUNTER — Ambulatory Visit (INDEPENDENT_AMBULATORY_CARE_PROVIDER_SITE_OTHER): Payer: Medicare Other | Admitting: Internal Medicine

## 2018-01-15 ENCOUNTER — Encounter: Payer: Self-pay | Admitting: Internal Medicine

## 2018-01-15 DIAGNOSIS — F0390 Unspecified dementia without behavioral disturbance: Secondary | ICD-10-CM | POA: Diagnosis not present

## 2018-01-15 DIAGNOSIS — E7800 Pure hypercholesterolemia, unspecified: Secondary | ICD-10-CM

## 2018-01-15 DIAGNOSIS — R0989 Other specified symptoms and signs involving the circulatory and respiratory systems: Secondary | ICD-10-CM

## 2018-01-15 DIAGNOSIS — I10 Essential (primary) hypertension: Secondary | ICD-10-CM

## 2018-01-15 DIAGNOSIS — E039 Hypothyroidism, unspecified: Secondary | ICD-10-CM

## 2018-01-15 DIAGNOSIS — I2510 Atherosclerotic heart disease of native coronary artery without angina pectoris: Secondary | ICD-10-CM | POA: Diagnosis not present

## 2018-01-15 DIAGNOSIS — Z23 Encounter for immunization: Secondary | ICD-10-CM

## 2018-01-15 DIAGNOSIS — E1159 Type 2 diabetes mellitus with other circulatory complications: Secondary | ICD-10-CM | POA: Diagnosis not present

## 2018-01-15 MED ORDER — METOPROLOL SUCCINATE ER 25 MG PO TB24: 25.0000 mg | ORAL_TABLET | Freq: Every day | ORAL | 1 refills | Status: DC

## 2018-01-15 MED ORDER — LISINOPRIL 20 MG PO TABS: 20.0000 mg | ORAL_TABLET | Freq: Every day | ORAL | 3 refills | Status: DC

## 2018-01-15 MED ORDER — DONEPEZIL HCL 10 MG PO TABS: 10.0000 mg | ORAL_TABLET | Freq: Every day | ORAL | 3 refills | Status: DC

## 2018-01-15 MED ORDER — LEVOTHYROXINE SODIUM 75 MCG PO TABS: ORAL_TABLET | ORAL | 3 refills | Status: DC

## 2018-01-15 MED ORDER — EZETIMIBE 10 MG PO TABS: 10.0000 mg | ORAL_TABLET | Freq: Every day | ORAL | 1 refills | Status: DC

## 2018-01-15 NOTE — Progress Notes (Signed)
Patient ID: Amanda [name], female   DOB: 1933-11-10, 84 y.o.   MRN: [id number]   Subjective:    Patient ID: Amanda [name], female    DOB: 05-03-1933, 84 y.o.   MRN: [id number]  HPI  Patient here for a scheduled follow up.  She is accompanied by her husband.  History obtained from both of them.  Has been out of her medication.  Husband reports not able to get prescriptions.  No chest pain.  No sob.  No acid reflux.  No abdominal pain.  Bowels moving.  Discussed labs.  Unable to take statin medication.  Intolerance.  Discussed zetia.  Still with memory issues.  Has been on aricept.  Seeing neurology.     Past Medical History:  Diagnosis Date  . Carotid bruit    bilaterally  . Dementia ([place])   . Hypercholesterolemia   . Hypertension   . Hypothyroidism    Past Surgical History:  Procedure Laterality Date  . CARPAL TUNNEL RELEASE    . CHOLECYSTECTOMY  1991  . THORACIC AORTA STENT  12/13   heart stent   Family History  Problem Relation Age of Onset  . Hypertension Father   . Lung cancer Father   . Hypertension Mother   . Dementia Mother   . Arthritis Other   . Breast cancer Cousin        mat cousin  . Colon cancer Neg Hx    Social History   Socioeconomic History  . Marital status: Married    Spouse name: Not on file  . Number of children: 2  . Years of education: Not on file  . Highest education level: Not on file  Occupational History  . Occupation: retired Tour manager  . Financial resource strain: Not hard at all  . Food insecurity:    Worry: Never true    Inability: Never true  . Transportation needs:    Medical: No    Non-medical: No  Tobacco Use  . Smoking status: Never Smoker  . Smokeless tobacco: Never Used  Substance and Sexual Activity  . Alcohol use: No    Alcohol/week: 0.0 standard drinks  . Drug use: No  . Sexual activity: Not on file  Lifestyle  . Physical activity:    Days per week: Not on file    Minutes per session: Not on file  .  Stress: Not on file  Relationships  . Social connections:    Talks on phone: Not on file    Gets together: Not on file    Attends religious service: Not on file    Active member of club or organization: Not on file    Attends meetings of clubs or organizations: Not on file    Relationship status: Not on file  Other Topics Concern  . Not on file  Social History Narrative   She is an only child. 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    Outpatient Encounter Medications as of 01/15/2018  Medication Sig  . aspirin 325 MG EC tablet Take 325 mg by mouth daily.  . [DISCONTINUED] donepezil (ARICEPT) 10 MG tablet TAKE ONE TABLET AT BEDTIME.  . [DISCONTINUED] levothyroxine (SYNTHROID) 75 MCG tablet Take 0.5 tablets (37.5 mcg total) by mouth daily.  . [DISCONTINUED] lisinopril (PRINIVIL,ZESTRIL) 20 MG tablet Take 1 tablet (20 mg total) by mouth daily.  . [DISCONTINUED] metoprolol succinate (TOPROL-XL) 25 MG 24 hr tablet TAKE ONE (1) TABLET  BY MOUTH ONCE DAILY  . donepezil (ARICEPT) 10 MG tablet Take 1 tablet (10 mg total) by mouth at bedtime.  Marland Kitchen ezetimibe (ZETIA) 10 MG tablet Take 1 tablet (10 mg total) by mouth daily.  Marland Kitchen levothyroxine (SYNTHROID) 75 MCG tablet 1/2 tablet q day  . lisinopril (PRINIVIL,ZESTRIL) 20 MG tablet Take 1 tablet (20 mg total) by mouth daily.  . metoprolol succinate (TOPROL XL) 25 MG 24 hr tablet Take 1 tablet (25 mg total) by mouth daily.   No facility-administered encounter medications on file as of 01/15/2018.     Review of Systems  Constitutional: Negative for appetite change and unexpected weight change.  HENT: Negative for congestion and sinus pressure.   Respiratory: Negative for cough, chest tightness and shortness of breath.   Cardiovascular: Negative for chest pain, palpitations and leg swelling.  Gastrointestinal: Negative for abdominal pain, diarrhea, nausea and vomiting.  Genitourinary: Negative for difficulty urinating  and dysuria.  Musculoskeletal: Negative for joint swelling and myalgias.  Skin: Negative for color change and rash.  Neurological: Negative for dizziness, light-headedness and headaches.  Psychiatric/Behavioral: Negative for agitation and dysphoric mood.       Objective:    Physical Exam Constitutional:      General: She is not in acute distress.    Appearance: Normal appearance.  HENT:     Nose: Nose normal. No congestion.     Mouth/Throat:     Pharynx: No oropharyngeal exudate or posterior oropharyngeal erythema.  Neck:     Musculoskeletal: Neck supple. No muscular tenderness.     Thyroid: No thyromegaly.  Cardiovascular:     Rate and Rhythm: Normal rate and regular rhythm.  Pulmonary:     Effort: No respiratory distress.     Breath sounds: Normal breath sounds. No wheezing.  Abdominal:     General: Bowel sounds are normal.     Palpations: Abdomen is soft.     Tenderness: There is no abdominal tenderness.  Musculoskeletal:        General: No swelling or tenderness.  Lymphadenopathy:     Cervical: No cervical adenopathy.  Skin:    Findings: No erythema or rash.  Neurological:     Mental Status: She is alert.  Psychiatric:        Mood and Affect: Mood normal.        Behavior: Behavior normal.     BP 136/78 (BP Location: Left Arm, Cuff Size: Normal)   Pulse 80   Temp 97.6 F (36.4 C) (Oral)   Resp 16   Wt 159 lb 6.4 oz (72.3 kg)   SpO2 95%   BMI 25.73 kg/m  Wt Readings from Last 3 Encounters:  01/15/18 159 lb 6.4 oz (72.3 kg)  10/27/17 153 lb 14.1 oz (69.8 kg)  09/11/17 154 lb 3.2 oz (69.9 kg)     Lab Results  Component Value Date   WBC 8.8 10/27/2017   HGB 14.2 10/27/2017   HCT 41.2 10/27/2017   PLT 270 10/27/2017   GLUCOSE 115 (H) 01/13/2018   CHOL 226 (H) 01/13/2018   TRIG 126.0 01/13/2018   HDL 38.20 (L) 01/13/2018   LDLCALC 162 (H) 01/13/2018   ALT 10 01/13/2018   AST 14 01/13/2018   NA 140 01/13/2018   K 4.8 01/13/2018   CL 106  01/13/2018   CREATININE 1.10 01/13/2018   BUN 19 01/13/2018   CO2 29 01/13/2018   TSH 4.75 (H) 01/13/2018   HGBA1C 6.5 01/13/2018   MICROALBUR 4.5 (H) 10/24/2016  Dg Chest 2 View  Result Date: 10/27/2017 CLINICAL DATA:  Bilateral hand tingling. EXAM: CHEST - 2 VIEW COMPARISON:  11/04/2011. FINDINGS: Mediastinum hilar structures normal. Lungs are clear. No pleural effusion or pneumothorax. Heart size normal. No acute bony abnormality. Degenerative change thoracic spine. IMPRESSION: No acute cardiopulmonary disease. Electronically Signed   By: Marcello Moores  Register   On: 10/27/2017 12:00   Ct Head Wo Contrast  Result Date: 10/27/2017 CLINICAL DATA:  Tingling of both hands beginning when patient woke up this morning, baseline dementia, grip strength equal, gait abnormalities, history hypertension EXAM: CT HEAD WITHOUT CONTRAST TECHNIQUE: Contiguous axial images were obtained from the base of the skull through the vertex without intravenous contrast. Sagittal and coronal MPR images reconstructed from axial data set. COMPARISON:  11/04/2011 FINDINGS: Brain: Generalized atrophy. Normal ventricular morphology. No midline shift or mass effect. Otherwise normal appearance of brain parenchyma. No intracranial hemorrhage, mass lesion or evidence acute infarction. No extra-axial fluid collections. Vascular: Mild atherosclerotic calcification of internal carotid and vertebral arteries at skull base Skull: Intact Sinuses/Orbits: Clear Other: N/A IMPRESSION: Age-related atrophy. No acute intracranial abnormalities. Electronically Signed   By: Lavonia Dana M.D.   On: 10/27/2017 11:48       Assessment & Plan:   Problem List Items Addressed This Visit    Abdominal bruit    Aortic ultrasound 08/2017 - no aneurysm.        CAD (coronary artery disease)    Currently asymptomatic.  Continue risk factor modification.  Followed by cardiology.        Relevant Medications   metoprolol succinate (TOPROL XL) 25 MG 24  hr tablet   lisinopril (PRINIVIL,ZESTRIL) 20 MG tablet   ezetimibe (ZETIA) 10 MG tablet   Dementia (HCC)    On aricept.  Stable.  Followed by neurology.        Relevant Medications   donepezil (ARICEPT) 10 MG tablet   Diabetes mellitus with circulatory complication (HCC)    Low carb diet and exercise.  Follow met b and a1c.        Relevant Medications   lisinopril (PRINIVIL,ZESTRIL) 20 MG tablet   Hypercholesteremia    Discussed recent lab results.  Unable to take statin medication.  Intolerant.  Discussed zetia.  Start zetia. Low cholesterol diet and exercise.  Follow lipid panel.        Relevant Medications   metoprolol succinate (TOPROL XL) 25 MG 24 hr tablet   lisinopril (PRINIVIL,ZESTRIL) 20 MG tablet   ezetimibe (ZETIA) 10 MG tablet   Hypertension, essential    Blood pressure as outlined.  Continue current medication.  Has been out of medication.  Restart.  Follow blood pressure and metabolic panel.        Relevant Medications   metoprolol succinate (TOPROL XL) 25 MG 24 hr tablet   lisinopril (PRINIVIL,ZESTRIL) 20 MG tablet   ezetimibe (ZETIA) 10 MG tablet   Other Relevant Orders   Basic metabolic panel   Hypothyroidism    On thyroid replacement. Has been out of medication.  Restart same dose.  Follow tsh.        Relevant Medications   metoprolol succinate (TOPROL XL) 25 MG 24 hr tablet   levothyroxine (SYNTHROID) 75 MCG tablet   Other Relevant Orders   TSH    Other Visit Diagnoses    Encounter for immunization       Relevant Orders   Flu vaccine HIGH DOSE PF (Completed)       Einar Pheasant, MD

## 2018-01-23 ENCOUNTER — Encounter: Payer: Self-pay | Admitting: Internal Medicine

## 2018-01-23 NOTE — Assessment & Plan Note (Signed)
Aortic ultrasound 08/2017 - no aneurysm.

## 2018-01-23 NOTE — Assessment & Plan Note (Signed)
Discussed recent lab results.  Unable to take statin medication.  Intolerant.  Discussed zetia.  Start zetia. Low cholesterol diet and exercise.  Follow lipid panel.

## 2018-01-23 NOTE — Assessment & Plan Note (Signed)
Low carb diet and exercise.  Follow met b and a1c.   

## 2018-01-23 NOTE — Assessment & Plan Note (Signed)
Blood pressure as outlined.  Continue current medication.  Has been out of medication.  Restart.  Follow blood pressure and metabolic panel.

## 2018-01-23 NOTE — Assessment & Plan Note (Signed)
Currently asymptomatic.  Continue risk factor modification.  Followed by cardiology.   

## 2018-01-23 NOTE — Assessment & Plan Note (Signed)
On aricept.  Stable.  Followed by neurology.

## 2018-01-23 NOTE — Assessment & Plan Note (Addendum)
On thyroid replacement. Has been out of medication.  Restart same dose.  Follow tsh.

## 2018-03-12 ENCOUNTER — Other Ambulatory Visit (INDEPENDENT_AMBULATORY_CARE_PROVIDER_SITE_OTHER): Payer: Medicare Other

## 2018-03-12 DIAGNOSIS — E039 Hypothyroidism, unspecified: Secondary | ICD-10-CM

## 2018-03-12 DIAGNOSIS — I10 Essential (primary) hypertension: Secondary | ICD-10-CM | POA: Diagnosis not present

## 2018-03-12 LAB — BASIC METABOLIC PANEL
BUN: 22 mg/dL (ref 6–23)
CO2: 28 mEq/L (ref 19–32)
CREATININE: 1.03 mg/dL (ref 0.40–1.20)
Calcium: 10.1 mg/dL (ref 8.4–10.5)
Chloride: 103 mEq/L (ref 96–112)
GFR: 51.19 mL/min — AB (ref >60.00)
Glucose, Bld: 108 mg/dL — ABNORMAL HIGH (ref 70–99)
POTASSIUM: 4.9 meq/L (ref 3.5–5.1)
Sodium: 139 mEq/L (ref 135–145)

## 2018-03-12 LAB — TSH: TSH: 3.06 u[IU]/mL (ref 0.35–4.50)

## 2018-05-20 ENCOUNTER — Encounter: Payer: Medicare Other | Admitting: Internal Medicine

## 2018-08-18 ENCOUNTER — Other Ambulatory Visit: Payer: Self-pay | Admitting: Internal Medicine

## 2018-08-18 DIAGNOSIS — Z1231 Encounter for screening mammogram for malignant neoplasm of breast: Secondary | ICD-10-CM

## 2018-08-23 NOTE — Telephone Encounter (Signed)
error 

## 2018-08-30 ENCOUNTER — Encounter: Payer: Medicare Other | Admitting: Internal Medicine

## 2018-09-06 ENCOUNTER — Ambulatory Visit: Payer: Medicare Other | Admitting: Internal Medicine

## 2018-09-06 ENCOUNTER — Ambulatory Visit (INDEPENDENT_AMBULATORY_CARE_PROVIDER_SITE_OTHER): Payer: Medicare Other | Admitting: Internal Medicine

## 2018-09-06 ENCOUNTER — Encounter: Payer: Self-pay | Admitting: Internal Medicine

## 2018-09-06 ENCOUNTER — Other Ambulatory Visit: Payer: Self-pay

## 2018-09-06 DIAGNOSIS — I2510 Atherosclerotic heart disease of native coronary artery without angina pectoris: Secondary | ICD-10-CM

## 2018-09-06 DIAGNOSIS — E7800 Pure hypercholesterolemia, unspecified: Secondary | ICD-10-CM

## 2018-09-06 DIAGNOSIS — F0390 Unspecified dementia without behavioral disturbance: Secondary | ICD-10-CM

## 2018-09-06 DIAGNOSIS — E039 Hypothyroidism, unspecified: Secondary | ICD-10-CM

## 2018-09-06 DIAGNOSIS — I10 Essential (primary) hypertension: Secondary | ICD-10-CM

## 2018-09-06 DIAGNOSIS — E1159 Type 2 diabetes mellitus with other circulatory complications: Secondary | ICD-10-CM | POA: Diagnosis not present

## 2018-09-06 NOTE — Assessment & Plan Note (Signed)
Not taking zetia.  Decided not to take.  Intolerant to statin medication.  Low cholesterol diet and exercise.  Follow lipid panel.

## 2018-09-06 NOTE — Progress Notes (Signed)
Patient ID: Amanda [name], female   DOB: October 14, 1933, 85 y.o.   MRN: [id number]   Virtual Visit via telephone Note  This visit type was conducted due to national recommendations for restrictions regarding the COVID-19 pandemic (e.g. social distancing).  This format is felt to be most appropriate for this patient at this time.  All issues noted in this document were discussed and addressed.  No physical exam was performed (except for noted visual exam findings with Video Visits).   I connected with Amanda [name] by telephone and verified that I am speaking with the correct person using two identifiers. Location patient: home Location provider: work Persons participating in the telephone visit: patient, provider and pts husband  I discussed the limitations, risks, security and privacy concerns of performing an evaluation and management service by telephone and the availability of in person appointments.  The patient expressed understanding and agreed to proceed.  Reason for visit: scheduled follow up  HPI: Reports she is doing well.  Physically feels good.  Stays active.  No chest pain.  No sob.  Able to do what she needs to around the house.  No acid reflux.  No abdominal pain.  Bowels moving.  No urine change.  Memory is relatively stable - per husband.  On aricept.  Amanda [name] is living with them.  He is getting their groceries, etc.  Overall feels good.  Has mammogram scheduled the end of the month.  Discussed due shingles vaccine.     ROS: See pertinent positives and negatives per HPI.  Past Medical History:  Diagnosis Date  . Carotid bruit    bilaterally  . Dementia ([place])   . Hypercholesterolemia   . Hypertension   . Hypothyroidism     Past Surgical History:  Procedure Laterality Date  . CARPAL TUNNEL RELEASE    . CHOLECYSTECTOMY  1991  . THORACIC AORTA STENT  12/13   heart stent    Family History  Problem Relation Age of Onset  . Hypertension Father   . Lung cancer Father    . Hypertension Mother   . Dementia Mother   . Arthritis Other   . Breast cancer Cousin        mat cousin  . Colon cancer Neg Hx     SOCIAL HX: reviewed.    Current Outpatient Medications:  .  aspirin 325 MG EC tablet, Take 325 mg by mouth daily., Disp: , Rfl:  .  donepezil (ARICEPT) 10 MG tablet, Take 1 tablet (10 mg total) by mouth at bedtime., Disp: 90 tablet, Rfl: 3 .  ezetimibe (ZETIA) 10 MG tablet, Take 1 tablet (10 mg total) by mouth daily., Disp: 90 tablet, Rfl: 1 .  levothyroxine (SYNTHROID) 75 MCG tablet, 1/2 tablet q day, Disp: 90 tablet, Rfl: 3 .  lisinopril (PRINIVIL,ZESTRIL) 20 MG tablet, Take 1 tablet (20 mg total) by mouth daily., Disp: 90 tablet, Rfl: 3 .  metoprolol succinate (TOPROL XL) 25 MG 24 hr tablet, Take 1 tablet (25 mg total) by mouth daily., Disp: 90 tablet, Rfl: 1  EXAM:  VITALS per patient if applicable:  GENERAL: alert.  Answering questions appropriately.  Sounds to be in no acute distress.    PSYCH/NEURO: pleasant and cooperative, no obvious depression or anxiety, speech and thought processing grossly intact  ASSESSMENT AND PLAN:  Discussed the following assessment and plan:  Hypercholesteremia Not taking zetia.  Decided not to take.  Intolerant to statin medication.  Low cholesterol diet and exercise.  Follow lipid  panel.    CAD (coronary artery disease) Currently asymptomatic.  Followed by cardiology.  Continue risk factor modification.    Dementia On aricept.  Followed by neurology.  Relatively stable.    Diabetes mellitus with circulatory complication (HCC) Low carb diet and exercise.  Follow met b and a1c.    Hypertension, essential Husband reports blood pressure has been doing well.  Continue current medication regimen.  Follow pressures.  Follow metabolic panel.    Hypothyroidism On thyroid replacement.  Follow tsh.      I discussed the assessment and treatment plan with the patient. The patient was provided an opportunity to  ask questions and all were answered. The patient agreed with the plan and demonstrated an understanding of the instructions.   The patient was advised to call back or seek an in-person evaluation if the symptoms worsen or if the condition fails to improve as anticipated.  I provided 15 minutes of non-face-to-face time during this encounter.   Einar Pheasant, MD

## 2018-09-11 ENCOUNTER — Encounter: Payer: Self-pay | Admitting: Internal Medicine

## 2018-09-11 NOTE — Assessment & Plan Note (Signed)
On thyroid replacement.  Follow tsh.  

## 2018-09-11 NOTE — Assessment & Plan Note (Signed)
Currently asymptomatic.  Followed by cardiology.  Continue risk factor modification.   

## 2018-09-11 NOTE — Assessment & Plan Note (Signed)
On aricept.  Followed by neurology.  Relatively stable.

## 2018-09-11 NOTE — Assessment & Plan Note (Signed)
Low carb diet and exercise.  Follow met b and a1c.   

## 2018-09-11 NOTE — Assessment & Plan Note (Signed)
Husband reports blood pressure has been doing well.  Continue current medication regimen.  Follow pressures.  Follow metabolic panel.

## 2018-09-27 ENCOUNTER — Other Ambulatory Visit: Payer: Self-pay

## 2018-09-27 ENCOUNTER — Ambulatory Visit
Admission: RE | Admit: 2018-09-27 | Discharge: 2018-09-27 | Disposition: A | Discharge status: Home / Self Care | Payer: Medicare Other | Source: Ambulatory Visit | Attending: Internal Medicine | Admitting: Internal Medicine

## 2018-09-27 DIAGNOSIS — Z1231 Encounter for screening mammogram for malignant neoplasm of breast: Secondary | ICD-10-CM | POA: Diagnosis present

## 2018-11-15 ENCOUNTER — Other Ambulatory Visit: Payer: Self-pay | Admitting: Internal Medicine

## 2018-12-02 ENCOUNTER — Encounter: Payer: Medicare Other | Admitting: Internal Medicine

## 2018-12-02 ENCOUNTER — Other Ambulatory Visit: Payer: Self-pay

## 2018-12-02 ENCOUNTER — Ambulatory Visit (INDEPENDENT_AMBULATORY_CARE_PROVIDER_SITE_OTHER): Payer: Medicare Other | Admitting: Internal Medicine

## 2018-12-02 VITALS — BP 140/70 | HR 71 | Temp 96.8°F | Resp 16 | Ht 63.0 in | Wt 164.0 lb

## 2018-12-02 DIAGNOSIS — Z0000 Encounter for general adult medical examination without abnormal findings: Secondary | ICD-10-CM

## 2018-12-02 DIAGNOSIS — E1159 Type 2 diabetes mellitus with other circulatory complications: Secondary | ICD-10-CM

## 2018-12-02 DIAGNOSIS — E039 Hypothyroidism, unspecified: Secondary | ICD-10-CM | POA: Diagnosis not present

## 2018-12-02 DIAGNOSIS — I10 Essential (primary) hypertension: Secondary | ICD-10-CM | POA: Diagnosis not present

## 2018-12-02 DIAGNOSIS — F0390 Unspecified dementia without behavioral disturbance: Secondary | ICD-10-CM

## 2018-12-02 DIAGNOSIS — I2510 Atherosclerotic heart disease of native coronary artery without angina pectoris: Secondary | ICD-10-CM

## 2018-12-02 DIAGNOSIS — E7800 Pure hypercholesterolemia, unspecified: Secondary | ICD-10-CM | POA: Diagnosis not present

## 2018-12-02 LAB — CBC WITH DIFFERENTIAL/PLATELET
Basophils Absolute: 0.1 10*3/uL (ref 0.0–0.1)
Basophils Relative: 0.8 % (ref 0.0–3.0)
Eosinophils Absolute: 0.2 10*3/uL (ref 0.0–0.7)
Eosinophils Relative: 1.8 % (ref 0.0–5.0)
HCT: 44.1 % (ref 36.0–46.0)
Hemoglobin: 14.3 g/dL (ref 12.0–15.0)
Lymphocytes Relative: 21.6 % (ref 12.0–46.0)
Lymphs Abs: 2.2 10*3/uL (ref 0.7–4.0)
MCHC: 32.4 g/dL (ref 30.0–36.0)
MCV: 87.3 fl (ref 78.0–100.0)
Monocytes Absolute: 0.9 10*3/uL (ref 0.1–1.0)
Monocytes Relative: 8.8 % (ref 3.0–12.0)
Neutro Abs: 7 10*3/uL (ref 1.4–7.7)
Neutrophils Relative %: 67 % (ref 43.0–77.0)
Platelets: 309 10*3/uL (ref 150.0–400.0)
RBC: 5.05 Mil/uL (ref 3.87–5.11)
RDW: 14.4 % (ref 11.5–15.5)
WBC: 10.4 10*3/uL (ref 4.0–10.5)

## 2018-12-02 LAB — MICROALBUMIN / CREATININE URINE RATIO
Creatinine,U: 156.9 mg/dL
Microalb Creat Ratio: 0.6 mg/g (ref 0.0–30.0)
Microalb, Ur: 0.9 mg/dL (ref 0.0–1.9)

## 2018-12-02 LAB — HEPATIC FUNCTION PANEL
ALT: 9 U/L (ref 0–35)
AST: 13 U/L (ref 0–37)
Albumin: 4.2 g/dL (ref 3.5–5.2)
Alkaline Phosphatase: 84 U/L (ref 39–117)
Bilirubin, Direct: 0.1 mg/dL (ref 0.0–0.3)
Total Bilirubin: 0.5 mg/dL (ref 0.2–1.2)
Total Protein: 7 g/dL (ref 6.0–8.3)

## 2018-12-02 LAB — BASIC METABOLIC PANEL
BUN: 20 mg/dL (ref 6–23)
CO2: 29 mEq/L (ref 19–32)
Calcium: 9.8 mg/dL (ref 8.4–10.5)
Chloride: 103 mEq/L (ref 96–112)
Creatinine, Ser: 1.04 mg/dL (ref 0.40–1.20)
GFR: 50.54 mL/min — ABNORMAL LOW (ref >60.00)
Glucose, Bld: 100 mg/dL — ABNORMAL HIGH (ref 70–99)
Potassium: 4.8 mEq/L (ref 3.5–5.1)
Sodium: 141 mEq/L (ref 135–145)

## 2018-12-02 LAB — LDL CHOLESTEROL, DIRECT: Direct LDL: 169 mg/dL

## 2018-12-02 LAB — LIPID PANEL
Cholesterol: 246 mg/dL — ABNORMAL HIGH (ref 0–200)
HDL: 36.4 mg/dL — ABNORMAL LOW (ref >39.00)
NonHDL: 209.82
Total CHOL/HDL Ratio: 7
Triglycerides: 219 mg/dL — ABNORMAL HIGH (ref 0.0–149.0)
VLDL: 43.8 mg/dL — ABNORMAL HIGH (ref 0.0–40.0)

## 2018-12-02 LAB — HEMOGLOBIN A1C: Hgb A1c MFr Bld: 6.6 % — ABNORMAL HIGH (ref 4.6–6.5)

## 2018-12-02 LAB — TSH: TSH: 3.15 u[IU]/mL (ref 0.35–4.50)

## 2018-12-02 NOTE — Assessment & Plan Note (Signed)
Physical today 12/02/18.  Mammogram 09/27/18 - Birads I.

## 2018-12-02 NOTE — Progress Notes (Signed)
Patient ID: Amanda [name], female   DOB: 1934/01/23, 85 y.o.   MRN: [id number]   Subjective:    Patient ID: Amanda [name], female    DOB: 1933-05-28, 85 y.o.   MRN: [id number]  HPI  Patient here for her physical exam.  She is accompanied by her husband.  History obtained from both of them.  She reports she is doing well.  Husband states that physically she is doing well.  Having problems with her memory. Taking her medication.  Has seen neurology.  No headache.  Eating. No nausea or vomiting.  No chest pain.  No sob.  No abdominal pain. Bowels moving.  Overall feels good.  Had pain with statin medications.     Past Medical History:  Diagnosis Date   Carotid bruit    bilaterally   Dementia ([facility])    Hypercholesterolemia    Hypertension    Hypothyroidism    Past Surgical History:  Procedure Laterality Date   CARPAL TUNNEL RELEASE     CHOLECYSTECTOMY  1991   THORACIC AORTA STENT  12/13   heart stent   Family History  Problem Relation Age of Onset   Hypertension Father    Lung cancer Father    Hypertension Mother    Dementia Mother    Arthritis Other    Breast cancer Cousin        mat cousin   Colon cancer Neg Hx    Social History   Socioeconomic History   Marital status: Married    Spouse name: Not on file   Number of children: 2   Years of education: Not on file   Highest education level: Not on file  Occupational History   Occupation: retired Scientist, research (physical sciences) strain: Not hard at all   Food insecurity    Worry: Never true    Inability: Never true   Transportation needs    Medical: No    Non-medical: No  Tobacco Use   Smoking status: Never Smoker   Smokeless tobacco: Never Used  Substance and Sexual Activity   Alcohol use: No    Alcohol/week: 0.0 standard drinks   Drug use: No   Sexual activity: Not on file  Lifestyle   Physical activity    Days per week: Not on file    Minutes per session: Not on file     Stress: Not on file  Relationships   Social connections    Talks on phone: Not on file    Gets together: Not on file    Attends religious service: Not on file    Active member of club or organization: Not on file    Attends meetings of clubs or organizations: Not on file    Relationship status: Not on file  Other Topics Concern   Not on file  Social History Narrative   She is an only child. 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    Outpatient Encounter Medications as of 12/02/2018  Medication Sig   aspirin 325 MG EC tablet Take 325 mg by mouth daily.   donepezil (ARICEPT) 10 MG tablet Take 1 tablet (10 mg total) by mouth at bedtime.   ezetimibe (ZETIA) 10 MG tablet Take 1 tablet (10 mg total) by mouth daily.   levothyroxine (SYNTHROID) 75 MCG tablet 1/2 tablet q day   lisinopril (PRINIVIL,ZESTRIL) 20 MG tablet Take 1 tablet (20 mg total) by mouth daily.  metoprolol succinate (TOPROL-XL) 25 MG 24 hr tablet TAKE ONE (1) TABLET BY MOUTH ONCE DAILY   No facility-administered encounter medications on file as of 12/02/2018.    Review of Systems  Constitutional: Negative for appetite change and unexpected weight change.  HENT: Negative for congestion and sinus pressure.   Eyes: Negative for pain and visual disturbance.  Respiratory: Negative for cough, chest tightness and shortness of breath.   Cardiovascular: Negative for chest pain, palpitations and leg swelling.  Gastrointestinal: Negative for abdominal pain, diarrhea, nausea and vomiting.  Genitourinary: Negative for difficulty urinating and dysuria.  Musculoskeletal: Negative for joint swelling and myalgias.  Skin: Negative for color change and rash.  Neurological: Negative for dizziness, light-headedness and headaches.  Hematological: Negative for adenopathy. Does not bruise/bleed easily.  Psychiatric/Behavioral: Negative for agitation and dysphoric mood.       Objective:     Physical Exam Constitutional:      General: She is not in acute distress.    Appearance: Normal appearance. She is well-developed.  HENT:     Head: Normocephalic and atraumatic.     Right Ear: External ear normal.     Left Ear: External ear normal.  Eyes:     General: No scleral icterus.       Right eye: No discharge.        Left eye: No discharge.     Conjunctiva/sclera: Conjunctivae normal.  Neck:     Musculoskeletal: Neck supple. No muscular tenderness.     Thyroid: No thyromegaly.  Cardiovascular:     Rate and Rhythm: Normal rate and regular rhythm.  Pulmonary:     Effort: No tachypnea, accessory muscle usage or respiratory distress.     Breath sounds: Normal breath sounds. No decreased breath sounds or wheezing.  Chest:     Breasts:        Right: No inverted nipple, mass, nipple discharge or tenderness (no axillary adenopathy).        Left: No inverted nipple, mass, nipple discharge or tenderness (no axilarry adenopathy).  Abdominal:     General: Bowel sounds are normal.     Palpations: Abdomen is soft.     Tenderness: There is no abdominal tenderness.  Musculoskeletal:        General: No swelling or tenderness.  Lymphadenopathy:     Cervical: No cervical adenopathy.  Skin:    Findings: No erythema or rash.  Neurological:     Mental Status: She is alert and oriented to person, place, and time.  Psychiatric:        Mood and Affect: Mood normal.        Behavior: Behavior normal.     BP 140/70    Pulse 71    Temp (!) 96.8 F (36 C)    Resp 16    Wt 164 lb (74.4 kg)    SpO2 97%    BMI 26.47 kg/m  Wt Readings from Last 3 Encounters:  12/02/18 164 lb (74.4 kg)  01/15/18 159 lb 6.4 oz (72.3 kg)  10/27/17 153 lb 14.1 oz (69.8 kg)     Lab Results  Component Value Date   WBC 10.4 12/02/2018   HGB 14.3 12/02/2018   HCT 44.1 12/02/2018   PLT 309.0 12/02/2018   GLUCOSE 100 (H) 12/02/2018   CHOL 246 (H) 12/02/2018   TRIG 219.0 (H) 12/02/2018   HDL 36.40 (L)  12/02/2018   LDLDIRECT 169.0 12/02/2018   LDLCALC 162 (H) 01/13/2018   ALT 9 12/02/2018  AST 13 12/02/2018   NA 141 12/02/2018   K 4.8 12/02/2018   CL 103 12/02/2018   CREATININE 1.04 12/02/2018   BUN 20 12/02/2018   CO2 29 12/02/2018   TSH 3.15 12/02/2018   HGBA1C 6.6 (H) 12/02/2018   MICROALBUR 0.9 12/02/2018    Mm 3d Screen Breast Bilateral  Result Date: 09/27/2018 CLINICAL DATA:  Screening. EXAM: DIGITAL SCREENING BILATERAL MAMMOGRAM WITH TOMO AND CAD COMPARISON:  Previous exam(s). ACR Breast Density Category b: There are scattered areas of fibroglandular density. FINDINGS: There are no findings suspicious for malignancy. Images were processed with CAD. IMPRESSION: No mammographic evidence of malignancy. A result letter of this screening mammogram will be mailed directly to the patient. RECOMMENDATION: Screening mammogram in one year. (Code:SM-B-01Y) BI-RADS CATEGORY  1: Negative. Electronically Signed   By: Fidela Salisbury M.D.   On: 09/27/2018 13:19       Assessment & Plan:   Problem List Items Addressed This Visit    CAD (coronary artery disease)    Currently asymptomatic.  Followed by cardiology.  Continue risk factor modification.        Dementia ([place])    On aricept.  Followed by neurology.        Diabetes mellitus with circulatory complication (HCC)    Low carb diet and exercise.  Follow met b and a1c.        Health care maintenance    Physical today 12/02/18.  Mammogram 09/27/18 - Birads I.        Hypercholesteremia    Intolerant to statin medication. Not taking zetia.  Low cholesterol diet and exercise.  Follow lipid panel.        Hypertension, essential    Blood pressure on recheck slightly improved.  Have them spot check pressure.  Follow.  Continue same medication regimen.       Hypothyroidism    On thyroid replacement.  Follow tsh.           Einar Pheasant, MD

## 2018-12-05 ENCOUNTER — Encounter: Payer: Self-pay | Admitting: Internal Medicine

## 2018-12-05 NOTE — Assessment & Plan Note (Signed)
On aricept.  Followed by neurology.  

## 2018-12-05 NOTE — Assessment & Plan Note (Signed)
Currently asymptomatic.  Followed by cardiology.  Continue risk factor modification.   

## 2018-12-05 NOTE — Assessment & Plan Note (Signed)
Low carb diet and exercise.  Follow met b and a1c.   

## 2018-12-05 NOTE — Assessment & Plan Note (Signed)
Intolerant to statin medication.  Not taking zetia.  Low cholesterol diet and exercise.  Follow lipid panel.  

## 2018-12-05 NOTE — Assessment & Plan Note (Signed)
Blood pressure on recheck slightly improved.  Have them spot check pressure.  Follow.  Continue same medication regimen.

## 2018-12-05 NOTE — Assessment & Plan Note (Signed)
On thyroid replacement.  Follow tsh.  

## 2019-01-19 ENCOUNTER — Encounter
Admission: EM | Disposition: A | Discharge status: Home / Self Care | Payer: Self-pay | Source: Home / Self Care | Attending: Internal Medicine

## 2019-01-19 ENCOUNTER — Encounter: Payer: Self-pay | Admitting: Internal Medicine

## 2019-01-19 ENCOUNTER — Emergency Department: Payer: Medicare Other

## 2019-01-19 ENCOUNTER — Other Ambulatory Visit: Payer: Self-pay

## 2019-01-19 ENCOUNTER — Inpatient Hospital Stay
Admission: EM | Admit: 2019-01-19 | Discharge: 2019-01-21 | DRG: 247 | Disposition: A | Discharge status: Home Health | Payer: Medicare Other | Attending: Internal Medicine | Admitting: Internal Medicine

## 2019-01-19 DIAGNOSIS — I200 Unstable angina: Secondary | ICD-10-CM

## 2019-01-19 DIAGNOSIS — F0390 Unspecified dementia without behavioral disturbance: Secondary | ICD-10-CM

## 2019-01-19 DIAGNOSIS — Z79899 Other long term (current) drug therapy: Secondary | ICD-10-CM | POA: Diagnosis not present

## 2019-01-19 DIAGNOSIS — I959 Hypotension, unspecified: Secondary | ICD-10-CM | POA: Diagnosis not present

## 2019-01-19 DIAGNOSIS — E039 Hypothyroidism, unspecified: Secondary | ICD-10-CM | POA: Diagnosis present

## 2019-01-19 DIAGNOSIS — Z20828 Contact with and (suspected) exposure to other viral communicable diseases: Secondary | ICD-10-CM | POA: Diagnosis present

## 2019-01-19 DIAGNOSIS — I2111 ST elevation (STEMI) myocardial infarction involving right coronary artery: Principal | ICD-10-CM | POA: Diagnosis present

## 2019-01-19 DIAGNOSIS — K449 Diaphragmatic hernia without obstruction or gangrene: Secondary | ICD-10-CM | POA: Diagnosis present

## 2019-01-19 DIAGNOSIS — I2510 Atherosclerotic heart disease of native coronary artery without angina pectoris: Secondary | ICD-10-CM | POA: Diagnosis present

## 2019-01-19 DIAGNOSIS — Z7982 Long term (current) use of aspirin: Secondary | ICD-10-CM | POA: Diagnosis not present

## 2019-01-19 DIAGNOSIS — Z8249 Family history of ischemic heart disease and other diseases of the circulatory system: Secondary | ICD-10-CM

## 2019-01-19 DIAGNOSIS — E1159 Type 2 diabetes mellitus with other circulatory complications: Secondary | ICD-10-CM | POA: Diagnosis not present

## 2019-01-19 DIAGNOSIS — E1151 Type 2 diabetes mellitus with diabetic peripheral angiopathy without gangrene: Secondary | ICD-10-CM | POA: Diagnosis present

## 2019-01-19 DIAGNOSIS — I25110 Atherosclerotic heart disease of native coronary artery with unstable angina pectoris: Secondary | ICD-10-CM | POA: Diagnosis present

## 2019-01-19 DIAGNOSIS — E7800 Pure hypercholesterolemia, unspecified: Secondary | ICD-10-CM | POA: Diagnosis present

## 2019-01-19 DIAGNOSIS — I10 Essential (primary) hypertension: Secondary | ICD-10-CM | POA: Diagnosis present

## 2019-01-19 DIAGNOSIS — R339 Retention of urine, unspecified: Secondary | ICD-10-CM | POA: Diagnosis present

## 2019-01-19 DIAGNOSIS — F03C0 Unspecified dementia, severe, without behavioral disturbance, psychotic disturbance, mood disturbance, and anxiety: Secondary | ICD-10-CM

## 2019-01-19 DIAGNOSIS — Z9049 Acquired absence of other specified parts of digestive tract: Secondary | ICD-10-CM

## 2019-01-19 DIAGNOSIS — E785 Hyperlipidemia, unspecified: Secondary | ICD-10-CM | POA: Diagnosis present

## 2019-01-19 DIAGNOSIS — Z801 Family history of malignant neoplasm of trachea, bronchus and lung: Secondary | ICD-10-CM | POA: Diagnosis not present

## 2019-01-19 DIAGNOSIS — Z79890 Hormone replacement therapy (postmenopausal): Secondary | ICD-10-CM | POA: Diagnosis not present

## 2019-01-19 DIAGNOSIS — Z8261 Family history of arthritis: Secondary | ICD-10-CM | POA: Diagnosis not present

## 2019-01-19 DIAGNOSIS — I2119 ST elevation (STEMI) myocardial infarction involving other coronary artery of inferior wall: Secondary | ICD-10-CM | POA: Diagnosis not present

## 2019-01-19 DIAGNOSIS — R079 Chest pain, unspecified: Secondary | ICD-10-CM | POA: Diagnosis not present

## 2019-01-19 LAB — CBC WITH DIFFERENTIAL/PLATELET
Abs Immature Granulocytes: 0.04 10*3/uL (ref 0.00–0.07)
Basophils Absolute: 0.1 10*3/uL (ref 0.0–0.1)
Basophils Relative: 1 %
Eosinophils Absolute: 0.3 10*3/uL (ref 0.0–0.5)
Eosinophils Relative: 3 %
HCT: 42.2 % (ref 36.0–46.0)
Hemoglobin: 13.8 g/dL (ref 12.0–15.0)
Immature Granulocytes: 0 %
Lymphocytes Relative: 16 %
Lymphs Abs: 1.9 10*3/uL (ref 0.7–4.0)
MCH: 28.3 pg (ref 26.0–34.0)
MCHC: 32.7 g/dL (ref 30.0–36.0)
MCV: 86.7 fL (ref 80.0–100.0)
Monocytes Absolute: 1 10*3/uL (ref 0.1–1.0)
Monocytes Relative: 8 %
Neutro Abs: 8.4 10*3/uL — ABNORMAL HIGH (ref 1.7–7.7)
Neutrophils Relative %: 72 %
Platelets: 323 10*3/uL (ref 150–400)
RBC: 4.87 MIL/uL (ref 3.87–5.11)
RDW: 14 % (ref 11.5–15.5)
WBC: 11.7 10*3/uL — ABNORMAL HIGH (ref 4.0–10.5)
nRBC: 0 % (ref 0.0–0.2)

## 2019-01-19 LAB — RESPIRATORY PANEL BY RT PCR (FLU A&B, COVID)
Influenza A by PCR: NEGATIVE
Influenza B by PCR: NEGATIVE
SARS Coronavirus 2 by RT PCR: NEGATIVE

## 2019-01-19 LAB — LIPASE, BLOOD: Lipase: 29 U/L (ref 11–51)

## 2019-01-19 LAB — TROPONIN I (HIGH SENSITIVITY)
Troponin I (High Sensitivity): 127 ng/L (ref <18)
Troponin I (High Sensitivity): 635 ng/L (ref <18)

## 2019-01-19 LAB — COMPREHENSIVE METABOLIC PANEL
ALT: 12 U/L (ref 0–44)
AST: 23 U/L (ref 15–41)
Albumin: 3.7 g/dL (ref 3.5–5.0)
Alkaline Phosphatase: 75 U/L (ref 38–126)
Anion gap: 9 (ref 5–15)
BUN: 19 mg/dL (ref 8–23)
CO2: 26 mmol/L (ref 22–32)
Calcium: 9.8 mg/dL (ref 8.9–10.3)
Chloride: 104 mmol/L (ref 98–111)
Creatinine, Ser: 1.01 mg/dL — ABNORMAL HIGH (ref 0.44–1.00)
GFR calc Af Amer: 60 mL/min — ABNORMAL LOW (ref >60)
GFR calc non Af Amer: 51 mL/min — ABNORMAL LOW (ref >60)
Glucose, Bld: 139 mg/dL — ABNORMAL HIGH (ref 70–99)
Potassium: 5.1 mmol/L (ref 3.5–5.1)
Sodium: 139 mmol/L (ref 135–145)
Total Bilirubin: 0.8 mg/dL (ref 0.3–1.2)
Total Protein: 7.3 g/dL (ref 6.5–8.1)

## 2019-01-19 LAB — APTT: aPTT: 30 seconds (ref 24–36)

## 2019-01-19 LAB — LIPID PANEL
Cholesterol: 232 mg/dL — ABNORMAL HIGH (ref 0–200)
HDL: 39 mg/dL — ABNORMAL LOW (ref >40)
LDL Cholesterol: 155 mg/dL — ABNORMAL HIGH (ref 0–99)
Total CHOL/HDL Ratio: 5.9 RATIO
Triglycerides: 189 mg/dL — ABNORMAL HIGH (ref <150)
VLDL: 38 mg/dL (ref 0–40)

## 2019-01-19 LAB — PROTIME-INR
INR: 0.9 (ref 0.8–1.2)
Prothrombin Time: 12.5 seconds (ref 11.4–15.2)

## 2019-01-19 SURGERY — CORONARY/GRAFT ACUTE MI REVASCULARIZATION
Anesthesia: Moderate Sedation

## 2019-01-19 MED ORDER — HEPARIN (PORCINE) 25000 UT/250ML-% IV SOLN
900.0000 [IU]/h | INTRAVENOUS | Status: DC
  Administered 2019-01-19: 850 [IU]/h via INTRAVENOUS
  Administered 2019-01-20 – 2019-01-21 (×2): 900 [IU]/h via INTRAVENOUS
  Filled 2019-01-19 (×2): qty 250

## 2019-01-19 MED ORDER — INSULIN ASPART 100 UNIT/ML ~~LOC~~ SOLN
0.0000 [IU] | SUBCUTANEOUS | Status: DC
  Administered 2019-01-20 – 2019-01-21 (×3): 1 [IU] via SUBCUTANEOUS
  Filled 2019-01-19 (×3): qty 1

## 2019-01-19 MED ORDER — HEPARIN SODIUM (PORCINE) 5000 UNIT/ML IJ SOLN
4000.0000 [IU] | Freq: Once | INTRAMUSCULAR | Status: AC
  Administered 2019-01-19: 4000 [IU] via INTRAVENOUS

## 2019-01-19 MED ORDER — ASPIRIN 81 MG PO CHEW
281.0000 mg | CHEWABLE_TABLET | Freq: Once | ORAL | Status: AC
  Administered 2019-01-19: 281 mg via ORAL

## 2019-01-19 MED ORDER — HEPARIN SODIUM (PORCINE) 5000 UNIT/ML IJ SOLN: 4000.0000 [IU]/kg | Freq: Once | INTRAMUSCULAR | Status: DC

## 2019-01-19 MED ORDER — IOHEXOL 350 MG/ML SOLN
75.0000 mL | Freq: Once | INTRAVENOUS | Status: AC | PRN
  Administered 2019-01-19: 75 mL via INTRAVENOUS

## 2019-01-19 NOTE — ED Notes (Signed)
Heparin verified with Annie Main, RN.

## 2019-01-19 NOTE — Consult Note (Signed)
Presented as a code STEMI from the field EKG borderline elevation in inferior leads reciprocal depression laterally Patient denies any chest pain Hemodynamically stable Follow-up EKG in the ER was nondiagnostic Since the patient was stable the emergency room doctor Dr. Joni Fears deemed not to be a STEMI My evaluation I concurred with him and recommended aggressive medical therapy Heparin aspirin possibly beta-blockers Recommend rule out myocardial infarction follow-up EKGs and troponins Cardiac cath will be deferred for now

## 2019-01-19 NOTE — Consult Note (Signed)
ANTICOAGULATION CONSULT NOTE - Initial Consult  Pharmacy Consult for Heparin Infusion Indication: chest pain/ACS  Allergies  Allergen Reactions  . Sulfa Antibiotics     Patient Measurements: Height: 5\' 4"  (162.6 cm) Weight: 157 lb 8 oz (71.4 kg) IBW/kg (Calculated) : 54.7 Heparin Dosing Weight: 69.3 kg  Vital Signs: Temp: 98.4 F (36.9 C) (12/23 1951) Temp Source: Oral (12/23 1951) BP: 166/59 (12/23 2000) Pulse Rate: 53 (12/23 2000)  Labs: Recent Labs    01/19/19 1944  HGB 13.8  HCT 42.2  PLT 323  APTT 30  LABPROT 12.5  INR 0.9  CREATININE 1.01*  TROPONINIHS 127*    Estimated Creatinine Clearance: 40.9 mL/min (A) (by C-G formula based on SCr of 1.01 mg/dL (H)).   Medical History: Past Medical History:  Diagnosis Date  . Carotid bruit    bilaterally  . Dementia ([place])   . Hypercholesterolemia   . Hypertension   . Hypothyroidism     Medications:  (Not in a hospital admission)  Scheduled:   Infusions:  . heparin     PRN:  Anti-infectives (From admission, onward)   None      Assessment: Pharmacy has been consulted to initiate Heparin Drip on 85yo patient complaining of chest pain and shortness of breath. Presented as a code STEMI but since patient was stable in the emergency room, the ED physician deemed that it was not a STEMI but to continue aggressive medical therapy. Patient has no history of anticoagulant use PTA. Baseline labs have been ordered and are pending.  Goal of Therapy:  Heparin level 0.3-0.7 units/ml Monitor platelets by anticoagulation protocol: Yes   Plan:  Give 4000 units bolus x 1 Start heparin infusion at 850 units/hr Check anti-Xa level in 8 hours and daily while on heparin Continue to monitor H&H and platelets  Ludene Stokke A Mahesh Sizemore 01/19/2019,8:25 PM

## 2019-01-19 NOTE — ED Triage Notes (Signed)
Pt arrived via [facility] from home emergency traffic with a code STEMI. Pt was vaccumming and had to lay down d/t CP and SOB. Hx of dementia, hypertension, and prior stent. Asa taken at home.Marland Kitchen

## 2019-01-19 NOTE — ED Provider Notes (Signed)
[facility] Emergency Department Provider Note  ____________________________________________  Time seen: Approximately 7:50 PM  I have reviewed the triage vital signs and the nursing notes.   HISTORY  Chief Complaint Code STEMI  Level 5 Caveat: Portions of the History and Physical including HPI and review of systems are unable to be completely obtained due to patient being a poor historian    HPI Amanda [name] is a 85 y.o. female with a history of dementia hypertension CAD and prior coronary artery stent managed by Dr. Nehemiah Massed who is brought to the ED after multiple episodes of shortness of breath on exertion today.  First episode happened while vacuuming after which the patient had reported some chest pain and felt very out of breath.  She laid down on the couch for a few hours and started to feel better.  However, when she got up and walked around again at home she again became very short of breath.  EMS reports that the patient was given 324 mg of aspirin at home but they are not sure.  No EMS medications administered.  Patient denies any complaints currently.  Denies chest pain or shortness of breath and states that she feels fine.   Past Medical History:  Diagnosis Date  . Carotid bruit    bilaterally  . Dementia ([place])   . Hypercholesterolemia   . Hypertension   . Hypothyroidism      Patient Active Problem List   Diagnosis Date Noted  . Abdominal bruit 09/14/2017  . Muscle ache 03/06/2017  . Fall 12/31/2014  . Health care maintenance 05/20/2014  . Diabetes mellitus with circulatory complication ([place]) 38/25/0539  . CAD (coronary artery disease) 05/15/2012  . Osteopenia 12/14/2011  . Hypertension, essential 11/13/2011  . Hypercholesteremia 11/13/2011  . Dementia ([place]) 11/13/2011  . Hypothyroidism 11/13/2011     Past Surgical History:  Procedure Laterality Date  . CARPAL TUNNEL RELEASE    . CHOLECYSTECTOMY  1991  . THORACIC AORTA STENT   12/13   heart stent     Prior to Admission medications   Medication Sig Start Date End Date Taking? Authorizing Provider  aspirin 325 MG EC tablet Take 325 mg by mouth daily.   Yes [provider]  donepezil (ARICEPT) 10 MG tablet Take 1 tablet (10 mg total) by mouth at bedtime. 01/15/18  Yes Einar Pheasant, MD  levothyroxine (SYNTHROID) 75 MCG tablet 1/2 tablet q day Patient taking differently: Take 37.5 mcg by mouth daily before breakfast. 1/2 tablet q day 01/15/18  Yes Einar Pheasant, MD  lisinopril (PRINIVIL,ZESTRIL) 20 MG tablet Take 1 tablet (20 mg total) by mouth daily. 01/15/18  Yes Einar Pheasant, MD  metoprolol succinate (TOPROL-XL) 25 MG 24 hr tablet TAKE ONE (1) TABLET BY MOUTH ONCE DAILY Patient taking differently: Take 25 mg by mouth daily.  11/15/18  Yes Einar Pheasant, MD  ezetimibe (ZETIA) 10 MG tablet Take 1 tablet (10 mg total) by mouth daily. Patient not taking: Reported on 01/19/2019 01/15/18   Einar Pheasant, MD     Allergies Sulfa antibiotics   Family History  Problem Relation Age of Onset  . Hypertension Father   . Lung cancer Father   . Hypertension Mother   . Dementia Mother   . Arthritis Other   . Breast cancer Cousin        mat cousin  . Colon cancer Neg Hx     Social History Social History   Tobacco Use  . Smoking status: Never Smoker  .  Smokeless tobacco: Never Used  Substance Use Topics  . Alcohol use: No    Alcohol/week: 0.0 standard drinks  . Drug use: No    Review of Systems  Constitutional:   No fever or chills.  ENT:   No sore throat. No rhinorrhea. Cardiovascular: Positive chest pain without syncope. Respiratory: Positive dyspnea on exertion without cough. Gastrointestinal:   Negative for abdominal pain, vomiting and diarrhea.  Musculoskeletal:   Negative for focal pain or swelling All other systems reviewed and are negative except as documented above in ROS and  HPI.  ____________________________________________   PHYSICAL EXAM:  VITAL SIGNS: ED Triage Vitals  Enc Vitals Group     BP      Pulse      Resp      Temp      Temp src      SpO2      Weight      Height      Head Circumference      Peak Flow      Pain Score      Pain Loc      Pain Edu?      Excl. in GC?     Vital signs reviewed, nursing assessments reviewed.   Constitutional:   Alert and oriented. Non-toxic appearance. Eyes:   Conjunctivae are normal. EOMI. PERRL. ENT      Head:   Normocephalic and atraumatic.      Nose:   Wearing a mask.      Mouth/Throat:   Wearing a mask.      Neck:   No meningismus. Full ROM. Hematological/Lymphatic/Immunilogical:   No cervical lymphadenopathy. Cardiovascular:   RRR. Symmetric bilateral radial and DP pulses.  No murmurs. Cap refill less than 2 seconds. Respiratory:   Normal respiratory effort without tachypnea/retractions. Breath sounds are clear and equal bilaterally. No wheezes/rales/rhonchi. Gastrointestinal:   Soft and nontender. Non distended. There is no CVA tenderness.  No rebound, rigidity, or guarding. Musculoskeletal:   Normal range of motion in all extremities. No joint effusions.  No lower extremity tenderness.  No edema. Neurologic:   Normal speech and language.  Motor grossly intact. No acute focal neurologic deficits are appreciated.  Skin:    Skin is warm, dry and intact. No rash noted.  No petechiae, purpura, or bullae.  ____________________________________________    LABS (pertinent positives/negatives) (all labs ordered are listed, but only abnormal results are displayed) Labs Reviewed  CBC WITH DIFFERENTIAL/PLATELET - Abnormal; Notable for the following components:      Result Value   WBC 11.7 (*)    Neutro Abs 8.4 (*)    All other components within normal limits  COMPREHENSIVE METABOLIC PANEL - Abnormal; Notable for the following components:   Glucose, Bld 139 (*)    Creatinine, Ser 1.01 (*)    GFR  calc non Af Amer 51 (*)    GFR calc Af Amer 60 (*)    All other components within normal limits  LIPID PANEL - Abnormal; Notable for the following components:   Cholesterol 232 (*)    Triglycerides 189 (*)    HDL 39 (*)    LDL Cholesterol 155 (*)    All other components within normal limits  TROPONIN I (HIGH SENSITIVITY) - Abnormal; Notable for the following components:   Troponin I (High Sensitivity) 127 (*)    All other components within normal limits  RESPIRATORY PANEL BY RT PCR (FLU A&B, COVID)  PROTIME-INR  APTT  LIPASE, BLOOD  HEPARIN LEVEL (UNFRACTIONATED)  TROPONIN I (HIGH SENSITIVITY)   ____________________________________________   EKG  EKGs interpreted by me  First EKG performed by EMS at 7:31 PM shows sinus rhythm rate of 63, normal axis, normal intervals.  1 mm ST elevation in lead III and aVF with 1 mm ST depression in 1 and aVL concerning for acute ischemia, but not clearly meeting STEMI criteria.  Second EKG performed by EMS done at 7:32 PM shows sinus rhythm rate of 61, normal axis and intervals.  Normal QRS.  Slight ST elevation in lead III only with 1 mm ST depression in high lateral leads.  Not diagnostic of STEMI  Third EKG performed in the ED at 7:46 PM shows sinus rhythm rate of 78, normal axis and intervals.  Normal QRS ST segments and T waves.  No acute ischemic changes.   ____________________________________________    RADIOLOGY  DG Chest Portable 1 View  Result Date: 01/19/2019 CLINICAL DATA:  Chest pain and shortness of breath. Code STEMI. EXAM: PORTABLE CHEST 1 VIEW COMPARISON:  Radiographs 10/27/2017 and 11/04/2011. FINDINGS: 2006 hours. Lordotic positioning. The heart size and mediastinal contours are stable. There is mild aortic atherosclerosis. The lungs are clear. There is no pleural effusion or pneumothorax. Telemetry leads and external pacing pads are in place. IMPRESSION: No active cardiopulmonary process. Electronically Signed   By:  Carey Bullocks M.D.   On: 01/19/2019 20:20    ____________________________________________   PROCEDURES .Critical Care Performed by: Sharman Cheek, MD Authorized by: Sharman Cheek, MD   Critical care provider statement:    Critical care time (minutes):  35   Critical care time was exclusive of:  Separately billable procedures and treating other patients   Critical care was necessary to treat or prevent imminent or life-threatening deterioration of the following conditions:  Cardiac failure   Critical care was time spent personally by me on the following activities:  Development of treatment plan with patient or surrogate, discussions with consultants, evaluation of patient's response to treatment, examination of patient, obtaining history from patient or surrogate, ordering and performing treatments and interventions, ordering and review of laboratory studies, ordering and review of radiographic studies, pulse oximetry, re-evaluation of patient's condition and review of old charts    ____________________________________________  DIFFERENTIAL DIAGNOSIS   STEMI, non-STEMI, GERD, pneumonia, pneumothorax  CLINICAL IMPRESSION / ASSESSMENT AND PLAN / ED COURSE  Medications ordered in the ED: Medications  heparin ADULT infusion 100 units/mL (25000 units/240mL sodium chloride 0.45%) (850 Units/hr Intravenous New Bag/Given 01/19/19 2048)  aspirin chewable tablet 281 mg (281 mg Oral Given 01/19/19 1950)  heparin injection 4,000 Units (4,000 Units Intravenous Given 01/19/19 1950)    Pertinent labs & imaging results that were available during my care of the patient were reviewed by me and considered in my medical decision making (see chart for details).  Amanda [name] was evaluated in Emergency Department on 01/19/2019 for the symptoms described in the history of present illness. She was evaluated in the context of the global COVID-19 pandemic, which necessitated consideration that  the patient might be at risk for infection with the SARS-CoV-2 virus that causes COVID-19. Institutional protocols and algorithms that pertain to the evaluation of patients at risk for COVID-19 are in a state of rapid change based on information released by regulatory bodies including the CDC and federal and state organizations. These policies and algorithms were followed during the patient's care in the ED.     Clinical Course as of Jan 19 2220  Wed Jan 19, 2019  1956 Patient presents with episodes of chest pain and dyspnea on exertion at home with EKG changes according to the EMS EKG is concerning for acute ischemia.  Currently asymptomatic.  Discussed with Dr. Juliann Pares at 7:40 PM who notes the prehospital EKGs did not strictly meet STEMI criteria, will start heparin and medical management.   [PS]  1957 Discussed with Dr. Juliann Pares again who agrees that her current presentation is not consistent with STEMI, will plan for heparinization and medical management.   [PS]    Clinical Course User Index [PS] Sharman Cheek, MD     ----------------------------------------- 10:21 PM on 01/19/2019 -----------------------------------------  First troponin I 127.  Patient already received heparin bolus.  Remains chest pain-free in the ED.  Continue heparin infusion, will admit for further management.  Doubt dissection PE or carditis.   ----------------------------------------- 10:48 PM on 01/19/2019 -----------------------------------------  Discussed with hospitalist who will come evaluate the patient.  They request CT angiogram to rule out PE which I will order. ____________________________________________   FINAL CLINICAL IMPRESSION(S) / ED DIAGNOSES    Final diagnoses:  Unstable angina pectoris (HCC)  Advanced dementia [facility])     ED Discharge Orders    None      Portions of this note were generated with dragon dictation software. Dictation errors may occur despite best  attempts at proofreading.   Sharman Cheek, MD 01/19/19 2248

## 2019-01-19 NOTE — H&P (Signed)
Amanda [name]:[id number] DOB: Dec 14, 1933 DOA: 01/19/2019     PCP: Dale Swartz, MD   Outpatient Specialists:   CARDS:  Dr. Gwen Pounds    Patient arrived to ER on 01/19/19 at 1941  Patient coming from: home Lives  With family    Chief Complaint:   Chief Complaint  Patient presents with  . Code STEMI    HPI: Amanda [name] is a 85 y.o. female with medical history significant of DM2, CAD, HTN, hypothyroidism, PVD    Presented with   Exertional chest pain while vacuuming associated with some shortness of [place] [facility] was called initial ECG was worrisome for ischemic changes. Patient was brought into emergency department as a code STEMI her chest pain has rapidly resolved Repeat EKG in the ER showing no evidence of STEMI She was noted to have elevated troponin and cardiology has been consulted code STEMI was canceled and cardiology requested medicine to admit for unstable angina Patient with significant dementia unable to provide any history currently stating she is not having any chest pain she does not recollect having any prior chest pain  Infectious risk factors:  Reports  shortness of breath chest pain,        in house  PCR testing NEGATIVE    Lab Results  Component Value Date   SARSCOV2NAA NEGATIVE 01/19/2019     Regarding pertinent Chronic problems:    Hyperlipidemia - not on statins does not tolerate   HTN on lisinopril, toprolol   Dementia on aricept    CAD  - On Aspirin,  [employer],                 -  followed by cardiology                - last cardiac cath   DM 2 -  Lab Results  Component Value Date   HGBA1C 6.6 (H) 12/02/2018    diet controlled   Hypothyroidism:  Lab Results  Component Value Date   TSH 3.15 12/02/2018   on synthroid       While in ER: Initially code STEMI was called cardiology arrived and evaluated patient Dr. Juliann Pares seen her At this time patient no longer having any chest pain EKG changes have resolved given  elevated troponin who recommended continuing heparin And admit to medicine continue to cycle cardiac enzymes Cardiac catheterization may be needed later time but not emergently  ER Provider Called:  cardiology   Dr. Juliann Pares They Recommend admit to medicine    Will see   in ER   The following Work up has been ordered so far:  Orders Placed This Encounter  Procedures  . Critical Care  . Respiratory Panel by RT PCR (Flu A&B, Covid) - Nasopharyngeal Swab  . DG Chest Portable 1 View  . CBC with Differential/Platelet  . Protime-INR  . APTT  . Comprehensive metabolic panel  . Lipid panel  . Lipase, blood  . Heparin level (unfractionated)  . Cardiac monitoring  . Call Code STEMI  . Document Actual / Estimated Weight  . heparin per pharmacy consult  . Consult to hospitalist  . Pulse oximetry, continuous  . EKG 12-Lead  . Saline lock IV    Following Medications were ordered in ER: Medications  heparin ADULT infusion 100 units/mL (25000 units/244mL sodium chloride 0.45%) (850 Units/hr Intravenous New Bag/Given 01/19/19 2048)  aspirin chewable tablet 281 mg (281 mg Oral Given 01/19/19 1950)  heparin injection 4,000 Units (4,000  Units Intravenous Given 01/19/19 1950)        Consult Orders  (From admission, onward)         Start     Ordered   01/19/19 2218  Consult to hospitalist  Once    Provider:  (Not yet assigned)  Question Answer Comment  Place call to: ED, [phone]   Reason for Consult Admit   Diagnosis/Clinical Info for Consult: unstable angina. chest pain, ekg changes. elevated trop      01/19/19 2217          Significant initial  Findings: Abnormal Labs Reviewed  CBC WITH DIFFERENTIAL/PLATELET - Abnormal; Notable for the following components:      Result Value   WBC 11.7 (*)    Neutro Abs 8.4 (*)    All other components within normal limits  COMPREHENSIVE METABOLIC PANEL - Abnormal; Notable for the following components:   Glucose, Bld 139 (*)     Creatinine, Ser 1.01 (*)    GFR calc non Af Amer 51 (*)    GFR calc Af Amer 60 (*)    All other components within normal limits  LIPID PANEL - Abnormal; Notable for the following components:   Cholesterol 232 (*)    Triglycerides 189 (*)    HDL 39 (*)    LDL Cholesterol 155 (*)    All other components within normal limits  TROPONIN I (HIGH SENSITIVITY) - Abnormal; Notable for the following components:   Troponin I (High Sensitivity) 127 (*)    All other components within normal limits    Otherwise labs showing:    Recent Labs  Lab 01/19/19 1944  NA 139  K 5.1  CO2 26  GLUCOSE 139*  BUN 19  CREATININE 1.01*  CALCIUM 9.8    Cr  Stable,  Lab Results  Component Value Date   CREATININE 1.01 (H) 01/19/2019   CREATININE 1.04 12/02/2018   CREATININE 1.03 03/12/2018    Recent Labs  Lab 01/19/19 1944  AST 23  ALT 12  ALKPHOS 75  BILITOT 0.8  PROT 7.3  ALBUMIN 3.7   Lab Results  Component Value Date   CALCIUM 9.8 01/19/2019     WBC      Component Value Date/Time   WBC 11.7 (H) 01/19/2019 1944   ANC    Component Value Date/Time   NEUTROABS 8.4 (H) 01/19/2019 1944   ALC No components found for: LYMPHAB    Plt: Lab Results  Component Value Date   PLT 323 01/19/2019       COVID-19 Labs    Lab Results  Component Value Date   SARSCOV2NAA NEGATIVE 01/19/2019     HG/HCT  stable,       Component Value Date/Time   HGB 13.8 01/19/2019 1944   HGB 14.5 11/04/2011 0934   HCT 42.2 01/19/2019 1944   HCT 43.9 11/04/2011 0934    Recent Labs  Lab 01/19/19 1944  LIPASE 29   No results for input(s): AMMONIA in the last 168 hours.  No components found for: LABALBU   Troponin 125 - 635 Cardiac Panel (last 3 results)      ECG: Ordered Personally reviewed by me showing: HR : 78 Rhythm:  NSR   no evidence of ischemic changes QTC 438   BNP (last 3 results) No results for input(s): BNP in the last 8760 hours.   DM  labs:  HbA1C: Recent Labs     12/02/18 1416  HGBA1C 6.6*     CBG (  last 3)  No results for input(s): GLUCAP in the last 72 hours.    UA not ordered   Urine analysis:    Component Value Date/Time   COLORURINE YELLOW (A) 10/27/2017 1348   APPEARANCEUR HAZY (A) 10/27/2017 1348   APPEARANCEUR Hazy 11/04/2011 0934   LABSPEC 1.015 10/27/2017 1348   LABSPEC 1.010 11/04/2011 0934   PHURINE 8.0 10/27/2017 1348   GLUCOSEU NEGATIVE 10/27/2017 1348   GLUCOSEU Negative 11/04/2011 0934   HGBUR NEGATIVE 10/27/2017 1348   BILIRUBINUR NEGATIVE 10/27/2017 1348   BILIRUBINUR neg 11/11/2011 1150   BILIRUBINUR Negative 11/04/2011 [place] 10/27/2017 1348   PROTEINUR NEGATIVE 10/27/2017 1348   UROBILINOGEN 0.2 11/11/2011 1150   NITRITE NEGATIVE 10/27/2017 1348   LEUKOCYTESUR NEGATIVE 10/27/2017 1348   LEUKOCYTESUR Negative 11/04/2011 0934      Ordered    CXR -  NON acute    CTA chest - nonacute, no PE,  no evidence of infiltrate possible atelectasis      ED Triage Vitals  Enc Vitals Group     BP 01/19/19 1948 (!) 189/77     Pulse Rate 01/19/19 1948 68     Resp 01/19/19 1948 17     Temp 01/19/19 1951 98.4 F (36.9 C)     Temp Source 01/19/19 1951 Oral     SpO2 01/19/19 1948 99 %     Weight 01/19/19 1959 157 lb 8 oz (71.4 kg)     Height 01/19/19 1959 5\' 4"  (1.626 m)     Head Circumference --      Peak Flow --      Pain Score --      Pain Loc --      Pain Edu? --      Excl. in [place]? --   TMAX(24)@       Latest  Blood pressure (!) 144/64, pulse 66, temperature 98.4 F (36.9 C), temperature source Oral, resp. rate 16, height 5\' 4"  (1.626 m), weight 71.4 kg, SpO2 97 %.    Hospitalist was called for admission for unstable angina   Review of Systems:    Pertinent positives include: No dyspnea on exertion, chest pain,  Constitutional:  No weight loss, night sweats, Fevers, chills, fatigue, weight loss  HEENT:  No headaches, Difficulty swallowing,Tooth/dental problems,Sore throat,  No  sneezing, itching, ear ache, nasal congestion, post nasal drip,  Cardio-vascular:  No  Orthopnea, PND, anasarca, dizziness, palpitations.no Bilateral lower extremity swelling  GI:  No heartburn, indigestion, abdominal pain, nausea, vomiting, diarrhea, change in bowel habits, loss of appetite, melena, blood in stool, hematemesis Resp:  no shortness of breath at rest. , No excess mucus, no productive cough, No non-productive cough, No coughing up of blood.No change in color of mucus.No wheezing. Skin:  no rash or lesions. No jaundice GU:  no dysuria, change in color of urine, no urgency or frequency. No straining to urinate.  No flank pain.  Musculoskeletal:  No joint pain or no joint swelling. No decreased range of motion. No back pain.  Psych:  No change in mood or affect. No depression or anxiety. No memory loss.  Neuro: no localizing neurological complaints, no tingling, no weakness, no double vision, no gait abnormality, no slurred speech, no confusion  All systems reviewed and apart from [place] all are negative  Past Medical History:   Past Medical History:  Diagnosis Date  . Carotid bruit    bilaterally  . Dementia ([place])   . Hypercholesterolemia   .  Hypertension   . Hypothyroidism      Past Surgical History:  Procedure Laterality Date  . CARPAL TUNNEL RELEASE    . CHOLECYSTECTOMY  1991  . THORACIC AORTA STENT  12/13   heart stent    Social History:  Ambulatory  independently      reports that she has never smoked. She has never used smokeless tobacco. She reports that she does not drink alcohol or use drugs.  Family History:   Family History  Problem Relation Age of Onset  . Hypertension Father   . Lung cancer Father   . Hypertension Mother   . Dementia Mother   . Arthritis Other   . Breast cancer Cousin        mat cousin  . Colon cancer Neg Hx     Allergies: Allergies  Allergen Reactions  . Sulfa Antibiotics      Prior to Admission medications     Medication Sig Start Date End Date Taking? Authorizing Provider  aspirin 325 MG EC tablet Take 325 mg by mouth daily.   Yes [provider]  donepezil (ARICEPT) 10 MG tablet Take 1 tablet (10 mg total) by mouth at bedtime. 01/15/18  Yes Dale [place], MD  levothyroxine (SYNTHROID) 75 MCG tablet 1/2 tablet q day Patient taking differently: Take 37.5 mcg by mouth daily before breakfast. 1/2 tablet q day 01/15/18  Yes Dale [place], MD  lisinopril (PRINIVIL,ZESTRIL) 20 MG tablet Take 1 tablet (20 mg total) by mouth daily. 01/15/18  Yes Dale [place], MD  metoprolol succinate (TOPROL-XL) 25 MG 24 hr tablet TAKE ONE (1) TABLET BY MOUTH ONCE DAILY Patient taking differently: Take 25 mg by mouth daily.  11/15/18  Yes Dale [place], MD  ezetimibe (ZETIA) 10 MG tablet Take 1 tablet (10 mg total) by mouth daily. Patient not taking: Reported on 01/19/2019 01/15/18   Dale [place], MD   Physical Exam: Blood pressure (!) 144/64, pulse 66, temperature 98.4 F (36.9 C), temperature source Oral, resp. rate 16, height  (1.626 m), weight 71.4 kg, SpO2 97 %. 1. General:  in No  Acute distress    Chronically ill  -appearing 2. Psychological: Alert and  Oriented 3. Head/ENT:     Dry Mucous Membranes                          Head Non traumatic, neck supple                            Poor Dentition 4. SKIN:  decreased Skin turgor,  5. Heart: Regular rate and rhythm no  Murmur,  6. Lungs:  Clear to auscultation bilaterally, no wheezes or crackles   7. Abdomen: Soft, non-tender, Non distended bowel sounds present 8. Lower extremities: no clubbing, cyanosis, no  edema 9. Neurologically Grossly intact, moving all 4 extremities equally   10. MSK: Normal range of motion   All other LABS:     Recent Labs  Lab 01/19/19 1944  WBC 11.7*  NEUTROABS 8.4*  HGB 13.8  HCT 42.2  MCV 86.7  PLT 323     Recent Labs  Lab 01/19/19 1944  NA 139  K 5.1  CL 104  CO2 26  GLUCOSE 139*   BUN 19  CREATININE 1.01*  CALCIUM 9.8     Recent Labs  Lab 01/19/19 1944  AST 23  ALT 12  ALKPHOS 75  BILITOT 0.8  PROT 7.3  ALBUMIN 3.7       Cultures: No results found for: SDES, SPECREQUEST, CULT, REPTSTATUS   Radiological Exams on Admission: DG Chest Portable 1 View  Result Date: 01/19/2019 CLINICAL DATA:  Chest pain and shortness of breath. Code STEMI. EXAM: PORTABLE CHEST 1 VIEW COMPARISON:  Radiographs 10/27/2017 and 11/04/2011. FINDINGS: 2006 hours. Lordotic positioning. The heart size and mediastinal contours are stable. There is mild aortic atherosclerosis. The lungs are clear. There is no pleural effusion or pneumothorax. Telemetry leads and external pacing pads are in place. IMPRESSION: No active cardiopulmonary process. Electronically Signed   By: Carey Bullocks M.D.   On: 01/19/2019 20:20    Chart has been reviewed    Assessment/Plan  85 y.o. female with medical history significant of DM2, CAD, HTN, hypothyroidism, PVD Admitted for Unstable Angina  Present on Admission: . Unstable angina (HCC) - - H=   1  ,E= 1  , A=  2   , R   2 , T   2 ,  for the  Total of 8  therefore will admit for observation and further evaluation ( Risk of MACE: Scores 0-3  of 0.9-1.7%.,  4-6: 12-16.6% , Scores ?7: 50-65% ) - CT A negative for significant PE  - troponin elevated and continues to trend up Initiated heparin  -No acute ischemic changes on ECG      - monitor on telemetry, cycle cardiac enzymes, obtain serial ECG and  ECHO in AM.   - Daily aspirin -  Further risk stratify with lipid panel, hgA1C, obtain TSH.  Make sure patient is on Aspirin.   cardiology aware Keep NPO post midnight in case will need cardiac cath Further management depends on pending  workup  . Hypertension, essential -chronic stable continue home medications  . Hypercholesteremia continue home medications  . Dementia (HCC) continue home medications  . Hypothyroidism -   Check TSH continue  home medications at current dose  . CAD (coronary artery disease) -  - chronic, continue aspirin  Does not tolerate statin   On  beta blocker  Appreciate cardiology input   . Diabetes mellitus with circulatory complication (HCC) -  - Order Sensitive SSI    -  check TSH and HgA1C      Other plan as per orders.  DVT prophylaxis:  Heparin    Code Status:  FULL CODE  as per patient   I had personally discussed CODE STATUS with patient    Family Communication:   Family not at  Bedside    Disposition Plan:       To home once workup is complete and patient is stable                                   Consults called: cardiology is Aware    Admission status:  ED Disposition    ED Disposition Condition Comment   Admit  The patient appears reasonably stabilized for admission considering the current resources, flow, and capabilities available in the ED at this time, and I doubt any other [facility] requiring further screening and/or treatment in the ED prior to admission is  present.       Obs       Level of care      tele  For  24H     please discontinue once patient no longer qualifies   Precautions: admitted as  Covid Negative  No active isolations    PPE: Used by the provider:   P100  eye Goggles,  Gloves      Ismael Karge 01/20/2019, 12:43 AM    [facility]     after 2 AM please page floor coverage PA If 7AM-7PM, please contact the day team taking care of the patient using [email or web address]

## 2019-01-19 NOTE — ED Notes (Addendum)
ED TO INPATIENT HANDOFF REPORT  ED Nurse Name and Phone #: Geraldine Contras [phone]  S Name/Age/Gender Amanda [name] 85 y.o. female Room/Bed: ED01A/ED01A  Code Status   Code Status: Not on file  Home/SNF/Other Home Patient oriented to: self Is this baseline? Yes   Triage Complete: Triage complete  Chief Complaint Unstable angina [facility]) [I20.0]  Triage Note Pt arrived via [facility] from home emergency traffic with a code STEMI. Pt was vaccumming and had to lay down d/t CP and SOB. Hx of dementia, hypertension, and prior stent. Asa taken at home..      Allergies Allergies  Allergen Reactions  . Sulfa Antibiotics     Level of Care/Admitting Diagnosis ED Disposition    ED Disposition Condition Comment   Admit  Hospital Area: [facility] [[id number]]  Level of Care: Telemetry [5]  Covid Evaluation: Confirmed COVID Negative  Diagnosis: Unstable angina [facility]) [[id number]]  Admitting Physician: Therisa Doyne [[id number]]  Attending Physician: Therisa Doyne [[id number]]  Estimated length of stay: 3 - 4 days  Certification:: I certify this patient will need inpatient services for at least 2 midnights       B Medical/Surgery History Past Medical History:  Diagnosis Date  . Carotid bruit    bilaterally  . Dementia (HCC)   . Hypercholesterolemia   . Hypertension   . Hypothyroidism    Past Surgical History:  Procedure Laterality Date  . CARPAL TUNNEL RELEASE    . CHOLECYSTECTOMY  1991  . THORACIC AORTA STENT  12/13   heart stent     A IV Location/Drains/Wounds Patient Lines/Drains/Airways Status   Active Line/Drains/Airways    Name:   Placement date:   Placement time:   Site:   Days:   Peripheral IV 10/27/17 Left Wrist   10/27/17    1207    Wrist   449   Peripheral IV 01/19/19 Left Hand   01/19/19    1946    Hand   less than 1   Peripheral IV 01/19/19 Right Antecubital   01/19/19    1947    Antecubital   less than 1          Intake/Output Last 24 hours No  intake or output data in the 24 hours ending 01/19/19 2354  Labs/Imaging Results for orders placed or performed during the hospital encounter of 01/19/19 (from the past 48 hour(s))  Respiratory Panel by RT PCR (Flu A&B, Covid) - Nasopharyngeal Swab     Status: None   Collection Time: 01/19/19  7:42 PM   Specimen: Nasopharyngeal Swab  Result Value Ref Range   SARS Coronavirus 2 by RT PCR NEGATIVE NEGATIVE    Comment: (NOTE) SARS-CoV-2 target nucleic acids are NOT DETECTED. The SARS-CoV-2 RNA is generally detectable in upper respiratoy specimens during the acute phase of infection. The lowest concentration of SARS-CoV-2 viral copies this assay can detect is 131 copies/mL. A negative result does not preclude SARS-Cov-2 infection and should not be used as the sole basis for treatment or other patient management decisions. A negative result may occur with  improper specimen collection/handling, submission of specimen other than nasopharyngeal swab, presence of viral mutation(s) within the areas targeted by this assay, and inadequate number of viral copies (<131 copies/mL). A negative result must be combined with clinical observations, patient history, and epidemiological information. The expected result is Negative. Fact Sheet for Patients:  https://www.moore.com/ Fact Sheet for Healthcare Providers:  https://www.young.biz/ This test is not yet ap proved or cleared by the  [place] [employer] and  has been authorized for detection and/or diagnosis of SARS-CoV-2 by FDA under an TEFL teacher (EUA). This EUA will remain  in effect (meaning this test can be used) for the duration of the COVID-19 declaration under Section 564(b)(1) of the Act, 21 U.S.C. section 360bbb-3(b)(1), unless the authorization is terminated or revoked sooner.    Influenza A by PCR NEGATIVE NEGATIVE   Influenza B by PCR NEGATIVE NEGATIVE    Comment: (NOTE) The Xpert  Xpress SARS-CoV-2/FLU/RSV assay is intended as an aid in  the diagnosis of influenza from Nasopharyngeal swab specimens and  should not be used as a sole basis for treatment. Nasal washings and  aspirates are unacceptable for Xpert Xpress SARS-CoV-2/FLU/RSV  testing. Fact Sheet for Patients: https://www.moore.com/ Fact Sheet for Healthcare Providers: https://www.young.biz/ This test is not yet approved or cleared by the [place] FDA and  has been authorized for detection and/or diagnosis of SARS-CoV-2 by  FDA under an Emergency Use Authorization (EUA). This EUA will remain  in effect (meaning this test can be used) for the duration of the  Covid-19 declaration under Section 564(b)(1) of the Act, 21  U.S.C. section 360bbb-3(b)(1), unless the authorization is  terminated or revoked. Performed at [facility], [address] [place], [place] [address]   CBC with Differential/Platelet     Status: Abnormal   Collection Time: 01/19/19  7:44 PM  Result Value Ref Range   WBC 11.7 (H) 4.0 - 10.5 K/uL   RBC 4.87 3.87 - 5.11 MIL/uL   Hemoglobin 13.8 12.0 - 15.0 g/dL   HCT 19.5 09.3 - 26.7 %   MCV 86.7 80.0 - 100.0 fL   MCH 28.3 26.0 - 34.0 pg   MCHC 32.7 30.0 - 36.0 g/dL   RDW 12.4 58.0 - 99.8 %   Platelets 323 150 - 400 K/uL   nRBC 0.0 0.0 - 0.2 %   Neutrophils Relative % 72 %   Neutro Abs 8.4 (H) 1.7 - 7.7 K/uL   Lymphocytes Relative 16 %   Lymphs Abs 1.9 0.7 - 4.0 K/uL   Monocytes Relative 8 %   Monocytes Absolute 1.0 0.1 - 1.0 K/uL   Eosinophils Relative 3 %   Eosinophils Absolute 0.3 0.0 - 0.5 K/uL   Basophils Relative 1 %   Basophils Absolute 0.1 0.0 - 0.1 K/uL   Immature Granulocytes 0 %   Abs Immature Granulocytes 0.04 0.00 - 0.07 K/uL    Comment: Performed at [facility], [address] [place], [place] [address]  Protime-INR     Status: None   Collection Time: 01/19/19  7:44 PM  Result Value Ref Range    Prothrombin Time 12.5 11.4 - 15.2 seconds   INR 0.9 0.8 - 1.2    Comment: (NOTE) INR goal varies based on device and disease states. Performed at [facility], [address] [place], [place] [address]   APTT     Status: None   Collection Time: 01/19/19  7:44 PM  Result Value Ref Range   aPTT 30 24 - 36 seconds    Comment: Performed at [facility], [address] [place], [place] [address]  Comprehensive metabolic panel     Status: Abnormal   Collection Time: 01/19/19  7:44 PM  Result Value Ref Range   Sodium 139 135 - 145 mmol/L   Potassium 5.1 3.5 - 5.1 mmol/L    Comment: HEMOLYSIS AT THIS LEVEL MAY AFFECT RESULT  Chloride 104 98 - 111 mmol/L   CO2 26 22 - 32 mmol/L   Glucose, Bld 139 (H) 70 - 99 mg/dL   BUN 19 8 - 23 mg/dL   Creatinine, Ser 1.01 (H) 0.44 - 1.00 mg/dL   Calcium 9.8 8.9 - 10.3 mg/dL   Total Protein 7.3 6.5 - 8.1 g/dL   Albumin 3.7 3.5 - 5.0 g/dL   AST 23 15 - 41 U/L    Comment: HEMOLYSIS AT THIS LEVEL MAY AFFECT RESULT   ALT 12 0 - 44 U/L   Alkaline Phosphatase 75 38 - 126 U/L   Total Bilirubin 0.8 0.3 - 1.2 mg/dL    Comment: HEMOLYSIS AT THIS LEVEL MAY AFFECT RESULT   GFR calc non Af Amer 51 (L) >60 mL/min   GFR calc Af Amer 60 (L) >60 mL/min   Anion gap 9 5 - 15    Comment: Performed at [facility], [address] [place], [place] [address]  Troponin I (High Sensitivity)     Status: Abnormal   Collection Time: 01/19/19  7:44 PM  Result Value Ref Range   Troponin I (High Sensitivity) 127 (HH) <18 ng/L    Comment: CRITICAL RESULT CALLED TO, READ BACK BY AND VERIFIED WITH DEE Yanelle Sousa 01/19/19 @ 2017  [place] (NOTE) Elevated high sensitivity troponin I (hsTnI) values and significant  changes across serial measurements may suggest ACS but many other  chronic and acute conditions are known to elevate hsTnI results.  Refer to the "Links" section for chest pain algorithms and additional  guidance. Performed at [facility], [address] [place], [place] [address]   Lipid panel     Status: Abnormal   Collection Time: 01/19/19  7:44 PM  Result Value Ref Range   Cholesterol 232 (H) 0 - 200 mg/dL   Triglycerides 189 (H) <150 mg/dL   HDL 39 (L) >40 mg/dL   Total CHOL/HDL Ratio 5.9 RATIO   VLDL 38 0 - 40 mg/dL   LDL Cholesterol 155 (H) 0 - 99 mg/dL    Comment:        Total Cholesterol/HDL:CHD Risk Coronary Heart Disease Risk Table                     Men   Women  1/2 Average Risk   3.4   3.3  Average Risk       5.0   4.4  2 X Average Risk   9.6   7.1  3 X Average Risk  23.4   11.0        Use the calculated Patient Ratio above and the CHD Risk Table to determine the patient's CHD Risk.        ATP III CLASSIFICATION (LDL):  <100     mg/dL   Optimal  100-129  mg/dL   Near or Above                    Optimal  130-159  mg/dL   Borderline  160-189  mg/dL   High  >190     mg/dL   Very High Performed at [facility], [address] [place], [place] [address]   Lipase, blood     Status: None   Collection Time: 01/19/19  7:44 PM  Result Value Ref Range   Lipase 29 11 - 51 U/L    Comment: Performed at [facility], [address] [place], [place] [address]  Troponin  I (High Sensitivity)     Status: Abnormal   Collection Time: 01/19/19 10:46 PM  Result Value Ref Range   Troponin I (High Sensitivity) 635 (HH) <18 ng/L    Comment: CRITICAL VALUE NOTED. VALUE IS CONSISTENT WITH PREVIOUSLY REPORTED/CALLED VALUE RWW (NOTE) Elevated high sensitivity troponin I (hsTnI) values and significant  changes across serial measurements may suggest ACS but many other  chronic and acute conditions are known to elevate hsTnI results.  Refer to the "Links" section for chest pain algorithms and additional  guidance. Performed at [facility], [address] [place], [place] [address]    CT Angio Chest PE W and/or Wo Contrast  Result Date: 01/19/2019 CLINICAL DATA:   Code STEMI, chest pain and shortness of breath EXAM: CT ANGIOGRAPHY CHEST WITH CONTRAST TECHNIQUE: Multidetector CT imaging of the chest was performed using the standard protocol during bolus administration of intravenous contrast. Multiplanar CT image reconstructions and MIPs were obtained to evaluate the vascular anatomy. CONTRAST:  75mL OMNIPAQUE IOHEXOL 350 MG/ML SOLN COMPARISON:  None. FINDINGS: Cardiovascular: There is a optimal opacification of the pulmonary arteries. There is no central,segmental, or subsegmental filling defects within the pulmonary arteries. There is moderate cardiomegaly. No evidence of right ventricular heart strain. Mild coronary artery and aortic atherosclerosis is seen. There is normal three-vessel brachiocephalic anatomy without proximal stenosis. The thoracic aorta is normal in appearance. Mediastinum/Nodes: No hilar, mediastinal, or axillary adenopathy. Thyroid gland, trachea, and esophagus demonstrate no significant findings. Lungs/Pleura: Mild streaky opacity seen at both lung bases. Small area of patchy airspace opacity in the left lung base. No pleural effusion or pneumothorax. Upper Abdomen: A small hiatal hernia present. No acute abnormalities present in the visualized portions of the upper abdomen. Musculoskeletal: No chest wall abnormality. No acute or significant osseous findings. Anterior flowing osteophytes seen within the midthoracic spine. Review of the MIP images confirms the above findings. IMPRESSION: No central, segmental, or subsegmental pulmonary embolism. Streaky/patchy airspace opacity at both lung bases which could be due to atelectasis and/or early infectious etiology. Moderate cardiomegaly. Electronically Signed   By: Jonna Clark M.D.   On: 01/19/2019 23:30   DG Chest Portable 1 View  Result Date: 01/19/2019 CLINICAL DATA:  Chest pain and shortness of breath. Code STEMI. EXAM: PORTABLE CHEST 1 VIEW COMPARISON:  Radiographs 10/27/2017 and 11/04/2011.  FINDINGS: 2006 hours. Lordotic positioning. The heart size and mediastinal contours are stable. There is mild aortic atherosclerosis. The lungs are clear. There is no pleural effusion or pneumothorax. Telemetry leads and external pacing pads are in place. IMPRESSION: No active cardiopulmonary process. Electronically Signed   By: Carey Bullocks M.D.   On: 01/19/2019 20:20    Pending Labs Unresulted Labs (From admission, onward)    Start     Ordered   01/20/19 0500  Heparin level (unfractionated)  Tomorrow morning,   STAT     01/19/19 2034   01/20/19 0500  Hemoglobin A1c  Tomorrow morning,   STAT    Comments: To assess prior glycemic control    01/19/19 2322   01/19/19 2350  Brain natriuretic peptide  Once,   STAT     01/19/19 2350          Vitals/Pain Today's Vitals   01/19/19 2130 01/19/19 2200 01/19/19 2230 01/19/19 2330  BP: (!) 144/64 (!) 163/76 (!) 171/74 (!) 160/83  Pulse: 66 74 73 79  Resp: Temp:      TempSrc:  SpO2: 97% 97% 96% 96%  Weight:      Height:        Isolation Precautions No active isolations  Medications Medications  heparin ADULT infusion 100 units/mL (25000 units/23650mL sodium chloride 0.45%) (850 Units/hr Intravenous New Bag/Given 01/19/19 2048)  insulin aspart (novoLOG) injection 0-9 Units (has no administration in time range)  aspirin chewable tablet 281 mg (281 mg Oral Given 01/19/19 1950)  heparin injection 4,000 Units (4,000 Units Intravenous Given 01/19/19 1950)  iohexol (OMNIPAQUE) 350 MG/ML injection 75 mL (75 mLs Intravenous Contrast Given 01/19/19 2259)    Mobility walks High fall risk   Focused Assessments    R Recommendations: See Admitting Provider Note  Report given to: Sherrie, RCharity fundraiser

## 2019-01-19 NOTE — Progress Notes (Signed)
   01/19/19 1945  Clinical Encounter Type  Visited With Patient not available  Visit Type Initial  Referral From Nurse  Advance Directives (For Healthcare)  Does Patient Have a Medical Advance Directive? No  Mental Health Advance Directives  Does Patient Have a Mental Health Advance Directive? No   Code STEMI page received for this patient. Upon arrival, the medical team was assessing the patient and administering care. This chaplain maintained pastoral presence outside the patient's room, offering silent prayer. Please call or page if needs arise.

## 2019-01-19 NOTE — ED Notes (Signed)
4000mg  heparin bolus given by Janett Billow, RN.

## 2019-01-20 ENCOUNTER — Encounter: Payer: Self-pay | Admitting: Internal Medicine

## 2019-01-20 ENCOUNTER — Inpatient Hospital Stay
Admit: 2019-01-20 | Discharge: 2019-01-20 | Disposition: A | Discharge status: Home / Self Care | Payer: Medicare Other | Attending: Internal Medicine | Admitting: Internal Medicine

## 2019-01-20 ENCOUNTER — Other Ambulatory Visit: Payer: Self-pay

## 2019-01-20 DIAGNOSIS — R079 Chest pain, unspecified: Secondary | ICD-10-CM

## 2019-01-20 LAB — BASIC METABOLIC PANEL
Anion gap: 8 (ref 5–15)
BUN: 17 mg/dL (ref 8–23)
CO2: 27 mmol/L (ref 22–32)
Calcium: 9.4 mg/dL (ref 8.9–10.3)
Chloride: 107 mmol/L (ref 98–111)
Creatinine, Ser: 0.95 mg/dL (ref 0.44–1.00)
GFR calc Af Amer: >60 mL/min (ref >60)
GFR calc non Af Amer: 55 mL/min — ABNORMAL LOW (ref >60)
Glucose, Bld: 122 mg/dL — ABNORMAL HIGH (ref 70–99)
Potassium: 5.3 mmol/L — ABNORMAL HIGH (ref 3.5–5.1)
Sodium: 142 mmol/L (ref 135–145)

## 2019-01-20 LAB — TROPONIN I (HIGH SENSITIVITY)
Troponin I (High Sensitivity): 1265 ng/L (ref <18)
Troponin I (High Sensitivity): 2287 ng/L (ref <18)
Troponin I (High Sensitivity): 2156 ng/L (ref <18)

## 2019-01-20 LAB — HEPARIN LEVEL (UNFRACTIONATED)
Heparin Unfractionated: 0.35 IU/mL (ref 0.30–0.70)
Heparin Unfractionated: 0.51 IU/mL (ref 0.30–0.70)

## 2019-01-20 LAB — HEMOGLOBIN A1C
Hgb A1c MFr Bld: 6.5 % — ABNORMAL HIGH (ref 4.8–5.6)
Mean Plasma Glucose: 139.85 mg/dL

## 2019-01-20 LAB — GLUCOSE, CAPILLARY
Glucose-Capillary: 112 mg/dL — ABNORMAL HIGH (ref 70–99)
Glucose-Capillary: 112 mg/dL — ABNORMAL HIGH (ref 70–99)
Glucose-Capillary: 110 mg/dL — ABNORMAL HIGH (ref 70–99)
Glucose-Capillary: 122 mg/dL — ABNORMAL HIGH (ref 70–99)
Glucose-Capillary: 137 mg/dL — ABNORMAL HIGH (ref 70–99)
Glucose-Capillary: 148 mg/dL — ABNORMAL HIGH (ref 70–99)

## 2019-01-20 LAB — BRAIN NATRIURETIC PEPTIDE: B Natriuretic Peptide: 76 pg/mL (ref 0.0–100.0)

## 2019-01-20 MED ORDER — LEVOTHYROXINE SODIUM 25 MCG PO TABS
37.5000 ug | ORAL_TABLET | Freq: Every day | ORAL | Status: DC
  Administered 2019-01-20 – 2019-01-21 (×2): 37.5 ug via ORAL
  Filled 2019-01-20 (×2): qty 2

## 2019-01-20 MED ORDER — ONDANSETRON HCL 4 MG/2ML IJ SOLN: 4.0000 mg | Freq: Four times a day (QID) | INTRAMUSCULAR | Status: DC | PRN

## 2019-01-20 MED ORDER — METOPROLOL SUCCINATE ER 25 MG PO TB24
25.0000 mg | ORAL_TABLET | Freq: Every day | ORAL | Status: DC
  Administered 2019-01-20 – 2019-01-21 (×2): 25 mg via ORAL
  Filled 2019-01-20 (×2): qty 1

## 2019-01-20 MED ORDER — DONEPEZIL HCL 5 MG PO TABS
10.0000 mg | ORAL_TABLET | Freq: Every day | ORAL | Status: DC
  Administered 2019-01-20: 10 mg via ORAL
  Filled 2019-01-20 (×3): qty 2

## 2019-01-20 MED ORDER — ACETAMINOPHEN 325 MG PO TABS: 650.0000 mg | ORAL_TABLET | ORAL | Status: DC | PRN

## 2019-01-20 MED ORDER — ASPIRIN EC 325 MG PO TBEC
325.0000 mg | DELAYED_RELEASE_TABLET | Freq: Every day | ORAL | Status: DC
  Administered 2019-01-20: 325 mg via ORAL
  Filled 2019-01-20: qty 1

## 2019-01-20 MED ORDER — INSULIN ASPART 100 UNIT/ML ~~LOC~~ SOLN: 0.0000 [IU] | Freq: Every day | SUBCUTANEOUS | Status: DC

## 2019-01-20 MED ORDER — LISINOPRIL 20 MG PO TABS
20.0000 mg | ORAL_TABLET | Freq: Every day | ORAL | Status: DC
  Administered 2019-01-20 – 2019-01-21 (×2): 20 mg via ORAL
  Filled 2019-01-20 (×2): qty 1

## 2019-01-20 MED ORDER — CLOPIDOGREL BISULFATE 75 MG PO TABS
75.0000 mg | ORAL_TABLET | Freq: Every day | ORAL | Status: DC
  Administered 2019-01-20 – 2019-01-21 (×2): 75 mg via ORAL
  Filled 2019-01-20 (×2): qty 1

## 2019-01-20 MED ORDER — SODIUM POLYSTYRENE SULFONATE 15 GM/60ML PO SUSP
15.0000 g | Freq: Once | ORAL | Status: AC
  Administered 2019-01-20: 15 g via ORAL
  Filled 2019-01-20: qty 60

## 2019-01-20 MED ORDER — ASPIRIN EC 81 MG PO TBEC
81.0000 mg | DELAYED_RELEASE_TABLET | Freq: Every day | ORAL | Status: DC
  Administered 2019-01-21: 81 mg via ORAL
  Filled 2019-01-20: qty 1

## 2019-01-20 MED ORDER — SODIUM CHLORIDE 0.9% FLUSH
10.0000 mL | Freq: Two times a day (BID) | INTRAVENOUS | Status: DC
  Administered 2019-01-20 – 2019-01-21 (×2): 10 mL via INTRAVENOUS

## 2019-01-20 NOTE — Consult Note (Signed)
ANTICOAGULATION CONSULT NOTE   Pharmacy Consult for Heparin Infusion Indication: chest pain/ACS  Allergies  Allergen Reactions  . Sulfa Antibiotics     Patient Measurements: Height: 5\' 4"  (162.6 cm) Weight: 161 lb 13.1 oz (73.4 kg) IBW/kg (Calculated) : 54.7 Heparin Dosing Weight: 69.3 kg  Vital Signs: Temp: 97.7 F (36.5 C) (12/24 0740) Temp Source: Oral (12/24 0740) BP: 172/76 (12/24 0740) Pulse Rate: 80 (12/24 0740)  Labs: Recent Labs    01/19/19 1944 01/20/19 0108 01/20/19 0435 01/20/19 1115 01/20/19 1314  HGB 13.8  --   --   --   --   HCT 42.2  --   --   --   --   PLT 323  --   --   --   --   APTT 30  --   --   --   --   LABPROT 12.5  --   --   --   --   INR 0.9  --   --   --   --   HEPARINUNFRC  --   --  0.35  --  0.51  CREATININE 1.01*  --  0.95  --   --   TROPONINIHS 127* 1,265* 2,287* 2,156*  --     Estimated Creatinine Clearance: 44.1 mL/min (by C-G formula based on SCr of 0.95 mg/dL).   Medical History: Past Medical History:  Diagnosis Date  . Carotid bruit    bilaterally  . Dementia (HCC)   . Hypercholesterolemia   . Hypertension   . Hypothyroidism     Medications:  Medications Prior to Admission  Medication Sig Dispense Refill Last Dose  . aspirin 325 MG EC tablet Take 325 mg by mouth daily.   01/19/2019 at 0730  . donepezil (ARICEPT) 10 MG tablet Take 1 tablet (10 mg total) by mouth at bedtime. 90 tablet 3 01/18/2019 at 2100  . levothyroxine (SYNTHROID) 75 MCG tablet 1/2 tablet q day (Patient taking differently: Take 37.5 mcg by mouth daily before breakfast. 1/2 tablet q day) 90 tablet 3 01/19/2019 at 0630  . lisinopril (PRINIVIL,ZESTRIL) 20 MG tablet Take 1 tablet (20 mg total) by mouth daily. 90 tablet 3 01/19/2019 at 0730  . metoprolol succinate (TOPROL-XL) 25 MG 24 hr tablet TAKE ONE (1) TABLET BY MOUTH ONCE DAILY (Patient taking differently: Take 25 mg by mouth daily. ) 90 tablet 1 01/19/2019 at 0730  . ezetimibe (ZETIA) 10 MG tablet  Take 1 tablet (10 mg total) by mouth daily. (Patient not taking: Reported on 01/19/2019) 90 tablet 1 Not Taking at Unknown time   Scheduled:  . [START ON 01/21/2019] aspirin  81 mg Oral Daily  . clopidogrel  75 mg Oral Daily  . donepezil  10 mg Oral QHS  . insulin aspart  0-9 Units Subcutaneous Q4H  . levothyroxine  37.5 mcg Oral QAC breakfast  . lisinopril  20 mg Oral Daily  . metoprolol succinate  25 mg Oral Daily   Infusions:  . heparin 900 Units/hr (01/20/19 0526)   PRN:  Anti-infectives (From admission, onward)   None      Assessment: Pharmacy has been consulted to initiate Heparin Drip on 85yo patient complaining of chest pain and shortness of breath. Presented as a code STEMI but since patient was stable in the emergency room, the ED physician deemed that it was not a STEMI but to continue aggressive medical therapy. Patient has no history of anticoagulant use PTA. Plan to continue heparin for another 24 hours  per Cards.  12/24 @ 0435 HL = 0.35, therapeutic x 1  12/24 @1314  HL = 0.51, therapeutic.   Goal of Therapy:  Heparin level 0.3-0.7 units/ml Monitor platelets by anticoagulation protocol: Yes   Plan:  Heparin level therapeutic. will continue heparin rate at 900 units/hr. Will order heparin and CBC will AM labs.   Oswald Hillock, PharmD, BCPS 01/20/2019,1:40 PM

## 2019-01-20 NOTE — TOC Initial Note (Signed)
Transition of Care (TOC) - Initial/Assessment Note    Patient Details  Name: Amanda [name] MRN: [id number] Date of Birth: 02/20/1933  Transition of Care (TOC) CM/SW Contact:     A , RN Phone Number: 01/20/2019, 9:26 AM  Clinical Narrative:                  Met with to review possible discharge needs.  Patient lives at home with her husband.  She is currently independent without use of assistive devices.  Patient appears anxious this morning and is worried about her husband.     Expected Discharge Plan: Home/Self Care Barriers to Discharge: Continued Medical Work up   Patient Goals and CMS Choice        Expected Discharge Plan and Services Expected Discharge Plan: Home/Self Care   Discharge Planning Services: CM Consult   Living arrangements for the past 2 months: Single Family Home                 DME Arranged: N/A DME Agency: NA                  Prior Living Arrangements/Services Living arrangements for the past 2 months: Single Family Home Lives with:: Spouse Patient language and need for interpreter reviewed:: Yes        Need for Family Participation in Patient Care: No (Comment) Care giver support system in place?: Yes (comment)   Criminal Activity/Legal Involvement Pertinent to Current Situation/Hospitalization: No - Comment as needed  Activities of Daily Living Home Assistive Devices/Equipment: Eyeglasses ADL Screening (condition at time of admission) Patient's cognitive ability adequate to safely complete daily activities?: No Is the patient deaf or have difficulty hearing?: No Does the patient have difficulty seeing, even when wearing glasses/contacts?: No Does the patient have difficulty concentrating, remembering, or making decisions?: Yes Patient able to express need for assistance with ADLs?: Yes Does the patient have difficulty dressing or bathing?: Yes Independently performs ADLs?: Yes (appropriate for developmental age) Does  the patient have difficulty walking or climbing stairs?: No Weakness of Legs: None Weakness of Arms/Hands: None  Permission Sought/Granted                  Emotional Assessment   Attitude/Demeanor/Rapport: Engaged Affect (typically observed): Anxious Orientation: : Oriented to Self, Oriented to Place, Oriented to  Time, Oriented to Situation Alcohol / Substance Use: Never Used Psych Involvement: No (comment)  Admission diagnosis:  Unstable angina (HCC) [I20.0] Unstable angina pectoris (HCC) [I20.0] Advanced dementia (HCC) [F03.90] Patient Active Problem List   Diagnosis Date Noted  . Unstable angina (HCC) 01/19/2019  . Abdominal bruit 09/14/2017  . Muscle ache 03/06/2017  . Fall 12/31/2014  . Health care maintenance 05/20/2014  . Diabetes mellitus with circulatory complication (HCC) 05/17/2013  . CAD (coronary artery disease) 05/15/2012  . Osteopenia 12/14/2011  . Hypertension, essential 11/13/2011  . Hypercholesteremia 11/13/2011  . Dementia (HCC) 11/13/2011  . Hypothyroidism 11/13/2011   PCP:  Scott, Charlene, MD Pharmacy:   [facility] [place], [place] - [address] [place] [address] Phone: [phone] Fax: [phone]  [facility] [place], [place] - [address] [place]  [address] Phone: [phone] Fax: [phone]     Social Determinants of Health (SDOH) Interventions    Readmission Risk Interventions No flowsheet data found.  

## 2019-01-20 NOTE — Plan of Care (Signed)
?  Problem: Clinical Measurements: ?Goal: Diagnostic test results will improve ?Outcome: Progressing ?Goal: Cardiovascular complication will be avoided ?Outcome: Progressing ?  ?Problem: Safety: ?Goal: Ability to remain free from injury will improve ?Outcome: Progressing ?  ?

## 2019-01-20 NOTE — Progress Notes (Signed)
PROGRESS NOTE    Amanda [name]  WJX:[id number] DOB: [birth year] DOA: 01/19/2019 PCP: Dale McArthur, MD    Brief Narrative:  Amanda [name] is a 85 y.o. female with medical history significant of DM2, CAD, HTN, hypothyroidism, PVD presented with  Exertional chest pain while vacuuming associated with some shortness of [place] [facility] was called initial ECG was worrisome for ischemic changes. Patient was brought into emergency department as a code STEMI her chest pain has rapidly resolved Repeat EKG in the ER showing no evidence of STEMI She was noted to have elevated troponin and cardiology has been consulted code STEMI was canceled and cardiology requested medicine to admit for unstable angina     Consultants:   Cardiology  Procedures: None  Antimicrobials:   None   Subjective: Patient is pleasantly confused, is aware she is in [place] but unaware where, not oriented to date or person.  Denies chest pain or shortness of breath.  Objective: Vitals:   01/19/19 2330 01/20/19 0055 01/20/19 0353 01/20/19 0740  BP: (!) 160/83 (!) 151/70 (!) 168/78 (!) 172/76  Pulse: 79 (!) 59 70 80  Resp: Temp:  98 F (36.7 C) 97.9 F (36.6 C) 97.7 F (36.5 C)  TempSrc:  Oral Oral Oral  SpO2: 96% 98% 97% 98%  Weight:  73.4 kg    Height:   (1.626 m)      Intake/Output Summary (Last 24 hours) at 01/20/2019 1404 Last data filed at 01/20/2019 1300 Gross per 24 hour  Intake 480 ml  Output 0 ml  Net 480 ml   Filed Weights   01/19/19 1959 01/20/19 0055  Weight: 71.4 kg 73.4 kg    Examination:  General exam: Appears calm and comfortable, NAD Respiratory system: Clear to auscultation. Respiratory effort normal. Cardiovascular system: S1 & S2 heard, RRR. No JVD, murmurs, rubs, gallops or clicks. Gastrointestinal system: Abdomen is nondistended, soft and nontender. Normal bowel sounds heard. Central nervous system: Alert and awake not oriented to time or person no focal  neurological deficits. Extremities: No edema Skin: Warm and dry Psychiatry: . Mood & affect appropriate.     Data Reviewed: I have personally reviewed following labs and imaging studies  CBC: Recent Labs  Lab 01/19/19 1944  WBC 11.7*  NEUTROABS 8.4*  HGB 13.8  HCT 42.2  MCV 86.7  PLT 323   Basic Metabolic Panel: Recent Labs  Lab 01/19/19 1944 01/20/19 0435  NA 139 142  K 5.1 5.3*  CL 104 107  CO2 26 27  GLUCOSE 139* 122*  BUN 19 17  CREATININE 1.01* 0.95  CALCIUM 9.8 9.4   GFR: Estimated Creatinine Clearance: 44.1 mL/min (by C-G formula based on SCr of 0.95 mg/dL). Liver Function Tests: Recent Labs  Lab 01/19/19 1944  AST 23  ALT 12  ALKPHOS 75  BILITOT 0.8  PROT 7.3  ALBUMIN 3.7   Recent Labs  Lab 01/19/19 1944  LIPASE 29   No results for input(s): AMMONIA in the last 168 hours. Coagulation Profile: Recent Labs  Lab 01/19/19 1944  INR 0.9   Cardiac Enzymes: No results for input(s): CKTOTAL, CKMB, CKMBINDEX, TROPONINI in the last 168 hours. BNP (last 3 results) No results for input(s): PROBNP in the last 8760 hours. HbA1C: Recent Labs    01/20/19 0435  HGBA1C 6.5*   CBG: Recent Labs  Lab 01/20/19 0101 01/20/19 0349 01/20/19 0742 01/20/19 1151  GLUCAP 112* 112* 110* 122*   Lipid Profile: Recent Labs  01/19/19 1944  CHOL 232*  HDL 39*  LDLCALC 155*  TRIG 189*  CHOLHDL 5.9   Thyroid Function Tests: No results for input(s): TSH, T4TOTAL, FREET4, T3FREE, THYROIDAB in the last 72 hours. Anemia Panel: No results for input(s): VITAMINB12, FOLATE, FERRITIN, TIBC, IRON, RETICCTPCT in the last 72 hours. Sepsis Labs: No results for input(s): PROCALCITON, LATICACIDVEN in the last 168 hours.  Recent Results (from the past 240 hour(s))  Respiratory Panel by RT PCR (Flu A&B, Covid) - Nasopharyngeal Swab     Status: None   Collection Time: 01/19/19  7:42 PM   Specimen: Nasopharyngeal Swab  Result Value Ref Range Status   SARS  Coronavirus 2 by RT PCR NEGATIVE NEGATIVE Final    Comment: (NOTE) SARS-CoV-2 target nucleic acids are NOT DETECTED. The SARS-CoV-2 RNA is generally detectable in upper respiratoy specimens during the acute phase of infection. The lowest concentration of SARS-CoV-2 viral copies this assay can detect is 131 copies/mL. A negative result does not preclude SARS-Cov-2 infection and should not be used as the sole basis for treatment or other patient management decisions. A negative result may occur with  improper specimen collection/handling, submission of specimen other than nasopharyngeal swab, presence of viral mutation(s) within the areas targeted by this assay, and inadequate number of viral copies (<131 copies/mL). A negative result must be combined with clinical observations, patient history, and epidemiological information. The expected result is Negative. Fact Sheet for Patients:  https://www.moore.com/https://www.fda.gov/media/142436/download Fact Sheet for Healthcare Providers:  https://www.young.biz/https://www.fda.gov/media/142435/download This test is not yet ap proved or cleared by the [place] FDA and  has been authorized for detection and/or diagnosis of SARS-CoV-2 by FDA under an Emergency Use Authorization (EUA). This EUA will remain  in effect (meaning this test can be used) for the duration of the COVID-19 declaration under Section 564(b)(1) of the Act, 21 U.S.C. section 360bbb-3(b)(1), unless the authorization is terminated or revoked sooner.    Influenza A by PCR NEGATIVE NEGATIVE Final   Influenza B by PCR NEGATIVE NEGATIVE Final    Comment: (NOTE) The Xpert Xpress SARS-CoV-2/FLU/RSV assay is intended as an aid in  the diagnosis of influenza from Nasopharyngeal swab specimens and  should not be used as a sole basis for treatment. Nasal washings and  aspirates are unacceptable for Xpert Xpress SARS-CoV-2/FLU/RSV  testing. Fact Sheet for Patients: https://www.moore.com/https://www.fda.gov/media/142436/download Fact Sheet  for Healthcare Providers: https://www.young.biz/https://www.fda.gov/media/142435/download This test is not yet approved or cleared by the [place] FDA and  has been authorized for detection and/or diagnosis of SARS-CoV-2 by  FDA under an Emergency Use Authorization (EUA). This EUA will remain  in effect (meaning this test can be used) for the duration of the  Covid-19 declaration under Section 564(b)(1) of the Act, 21  U.S.C. section 360bbb-3(b)(1), unless the authorization is  terminated or revoked. Performed at [facility], [address] [place], [place] [address]          Radiology Studies: CT Angio Chest PE W and/or Wo Contrast  Result Date: 01/19/2019 CLINICAL DATA:  Code STEMI, chest pain and shortness of breath EXAM: CT ANGIOGRAPHY CHEST WITH CONTRAST TECHNIQUE: Multidetector CT imaging of the chest was performed using the standard protocol during bolus administration of intravenous contrast. Multiplanar CT image reconstructions and MIPs were obtained to evaluate the vascular anatomy. CONTRAST:  75mL OMNIPAQUE IOHEXOL 350 MG/ML SOLN COMPARISON:  None. FINDINGS: Cardiovascular: There is a optimal opacification of the pulmonary arteries. There is no central,segmental, or subsegmental filling defects within the pulmonary arteries.  There is moderate cardiomegaly. No evidence of right ventricular heart strain. Mild coronary artery and aortic atherosclerosis is seen. There is normal three-vessel brachiocephalic anatomy without proximal stenosis. The thoracic aorta is normal in appearance. Mediastinum/Nodes: No hilar, mediastinal, or axillary adenopathy. Thyroid gland, trachea, and esophagus demonstrate no significant findings. Lungs/Pleura: Mild streaky opacity seen at both lung bases. Small area of patchy airspace opacity in the left lung base. No pleural effusion or pneumothorax. Upper Abdomen: A small hiatal hernia present. No acute abnormalities present in the visualized portions of the upper  abdomen. Musculoskeletal: No chest wall abnormality. No acute or significant osseous findings. Anterior flowing osteophytes seen within the midthoracic spine. Review of the MIP images confirms the above findings. IMPRESSION: No central, segmental, or subsegmental pulmonary embolism. Streaky/patchy airspace opacity at both lung bases which could be due to atelectasis and/or early infectious etiology. Moderate cardiomegaly. Electronically Signed   By: Jonna Clark M.D.   On: 01/19/2019 23:30   DG Chest Portable 1 View  Result Date: 01/19/2019 CLINICAL DATA:  Chest pain and shortness of breath. Code STEMI. EXAM: PORTABLE CHEST 1 VIEW COMPARISON:  Radiographs 10/27/2017 and 11/04/2011. FINDINGS: 2006 hours. Lordotic positioning. The heart size and mediastinal contours are stable. There is mild aortic atherosclerosis. The lungs are clear. There is no pleural effusion or pneumothorax. Telemetry leads and external pacing pads are in place. IMPRESSION: No active cardiopulmonary process. Electronically Signed   By: Carey Bullocks M.D.   On: 01/19/2019 20:20        Scheduled Meds: . [START ON 01/21/2019] aspirin  81 mg Oral Daily  . clopidogrel  75 mg Oral Daily  . donepezil  10 mg Oral QHS  . insulin aspart  0-9 Units Subcutaneous Q4H  . levothyroxine  37.5 mcg Oral QAC breakfast  . lisinopril  20 mg Oral Daily  . metoprolol succinate  25 mg Oral Daily   Continuous Infusions: . heparin 900 Units/hr (01/20/19 0526)    Assessment & Plan:   Active Problems:   Hypertension, essential   Hypercholesteremia   Dementia (HCC)   Hypothyroidism   CAD (coronary artery disease)   Diabetes mellitus with circulatory complication (HCC)   Unstable angina (HCC)  Unstable angina (HCC)  - CT A negative for significant PE  - troponin elevated Cardiology following-recommends start Plavix 75 mg once a day for last 6 months, continue lisinopril 20 mg daily, maintain metoprolol daily, Zetia for lipid  management, and consider reinstituting statin therapy as outpatient .  Do not recommend invasive strategy with defer cardiac cath for now and treat medically Consider adding Imdur 30 mg daily Continue IV heparin for additional 24 hours Reducing aspirin to 81 mg If patient remains asymptomatic will consider discharge within the next 48 to 72 hours per cardiology follow-up cards as outpatient in 1 to 2 weeks   . Hypertension, essential -on lisinopril. Add metoprolol  . Hypercholesteremia continue home medications  . Dementia (HCC). On Aricept  . Hypothyroidism -   on Levothyroixne  . CAD (coronary artery disease) -  - chronic, continue aspirin  Does not tolerate statin   On  beta blocker  Appreciate cardiology input   . Diabetes mellitus with circulatory complication (HCC) -  - Order Sensitive SSI    -  check TSH and HgA1C        DVT prophylaxis:  Heparin   Code Status:full Family Communication: None at bedside Disposition Plan: Likely be inpatient for 2 more days until medically stable  LOS: 1 day   Time spent: 45 minutes with more than 50% on [place], MD [facility] Pager [phone]  If 7PM-7AM, please contact night-coverage [email or web address] Password [email or web address] 01/20/2019, 2:04 PM

## 2019-01-20 NOTE — Consult Note (Signed)
[place] for Heparin Infusion Indication: chest pain/ACS  Allergies  Allergen Reactions  . Sulfa Antibiotics     Patient Measurements: Height: 5\' 4"  (162.6 cm) Weight: 161 lb 13.1 oz (73.4 kg) IBW/kg (Calculated) : 54.7 Heparin Dosing Weight: 69.3 kg  Vital Signs: Temp: 97.9 F (36.6 C) (12/24 0353) Temp Source: Oral (12/24 0353) BP: 168/78 (12/24 0353) Pulse Rate: 70 (12/24 0353)  Labs: Recent Labs    01/19/19 1944 01/19/19 2246 01/20/19 0108 01/20/19 0435  HGB 13.8  --   --   --   HCT 42.2  --   --   --   PLT 323  --   --   --   APTT 30  --   --   --   LABPROT 12.5  --   --   --   INR 0.9  --   --   --   HEPARINUNFRC  --   --   --  0.35  CREATININE 1.01*  --   --   --   TROPONINIHS 127* 635* 1,265*  --     Estimated Creatinine Clearance: 41.4 mL/min (A) (by C-G formula based on SCr of 1.01 mg/dL (H)).   Medical History: Past Medical History:  Diagnosis Date  . Carotid bruit    bilaterally  . Dementia ([place])   . Hypercholesterolemia   . Hypertension   . Hypothyroidism     Medications:  Medications Prior to Admission  Medication Sig Dispense Refill Last Dose  . aspirin 325 MG EC tablet Take 325 mg by mouth daily.   01/19/2019 at 0730  . donepezil (ARICEPT) 10 MG tablet Take 1 tablet (10 mg total) by mouth at bedtime. 90 tablet 3 01/18/2019 at 2100  . levothyroxine (SYNTHROID) 75 MCG tablet 1/2 tablet q day (Patient taking differently: Take 37.5 mcg by mouth daily before breakfast. 1/2 tablet q day) 90 tablet 3 01/19/2019 at 0630  . lisinopril (PRINIVIL,ZESTRIL) 20 MG tablet Take 1 tablet (20 mg total) by mouth daily. 90 tablet 3 01/19/2019 at 0730  . metoprolol succinate (TOPROL-XL) 25 MG 24 hr tablet TAKE ONE (1) TABLET BY MOUTH ONCE DAILY (Patient taking differently: Take 25 mg by mouth daily. ) 90 tablet 1 01/19/2019 at 0730  . ezetimibe (ZETIA) 10 MG tablet Take 1 tablet (10 mg total) by mouth daily. (Patient not  taking: Reported on 01/19/2019) 90 tablet 1 Not Taking at Unknown time   Scheduled:  . aspirin  325 mg Oral Daily  . donepezil  10 mg Oral QHS  . insulin aspart  0-9 Units Subcutaneous Q4H  . levothyroxine  37.5 mcg Oral QAC breakfast  . lisinopril  20 mg Oral Daily  . metoprolol succinate  25 mg Oral Daily   Infusions:  . heparin 850 Units/hr (01/19/19 2048)   PRN:  Anti-infectives (From admission, onward)   None      Assessment: Pharmacy has been consulted to initiate Heparin Drip on 85yo patient complaining of chest pain and shortness of breath. Presented as a code STEMI but since patient was stable in the emergency room, the ED physician deemed that it was not a STEMI but to continue aggressive medical therapy. Patient has no history of anticoagulant use PTA. Baseline labs have been ordered and are pending.  12/24 @ 0435 HL = 0.35, therapeutic x 1 (on low end)  Goal of Therapy:  Heparin level 0.3-0.7 units/ml Monitor platelets by anticoagulation protocol: Yes   Plan:  Heparin level therapeutic on low end, will increase slightly to 900 units/hr to discourage drop Check confirmatory heparin level in 8 hours  Margo Aye, Antione Obar A 01/20/2019,5:16 AM

## 2019-01-20 NOTE — Progress Notes (Signed)
*  PRELIMINARY RESULTS* Echocardiogram 2D Echocardiogram has been performed.  Amanda [name] 01/20/2019, 12:24 PM

## 2019-01-20 NOTE — Consult Note (Signed)
CARDIOLOGY CONSULT NOTE               Patient ID: Amanda [name] MRN: [id number] DOB/AGE: 83-26-1935 83 y.o.  Admit date: 01/19/2019 Referring Physician Therisa Doyneoutova, Anastassia Primary Physician Dale Durhamharlene Scott MD Primary Cardiologist Gwen PoundsKowalski Reason for Consultation non-STEMI  HPI: Presented with abnormal EKG with subtle ST elevation inferiorly reciprocal depressions laterally patient was hemodynamically stable by the time she got the emergency room EKG had somewhat improved denies any chest pain symptoms the patient was then treated medically and cardiac cath and intervention was deferred she is done reasonably well on heparin troponins are mildly elevated so far 2.2 we will follow-up an additional troponin to be sure does not rise significantly more.  Would recommend conservative medical management and defer cardiac cath continue heparin for an additional 24 hours patient has dementia so history is difficult but she denies any anginal symptoms  Review of systems complete and found to be negative unless listed above     Past Medical History:  Diagnosis Date  . Carotid bruit    bilaterally  . Dementia (HCC)   . Hypercholesterolemia   . Hypertension   . Hypothyroidism     Past Surgical History:  Procedure Laterality Date  . CARPAL TUNNEL RELEASE    . CHOLECYSTECTOMY  1991  . THORACIC AORTA STENT  12/13   heart stent    Medications Prior to Admission  Medication Sig Dispense Refill Last Dose  . aspirin 325 MG EC tablet Take 325 mg by mouth daily.   01/19/2019 at 0730  . donepezil (ARICEPT) 10 MG tablet Take 1 tablet (10 mg total) by mouth at bedtime. 90 tablet 3 01/18/2019 at 2100  . levothyroxine (SYNTHROID) 75 MCG tablet 1/2 tablet q day (Patient taking differently: Take 37.5 mcg by mouth daily before breakfast. 1/2 tablet q day) 90 tablet 3 01/19/2019 at 0630  . lisinopril (PRINIVIL,ZESTRIL) 20 MG tablet Take 1 tablet (20 mg total) by mouth daily. 90 tablet 3 01/19/2019 at  0730  . metoprolol succinate (TOPROL-XL) 25 MG 24 hr tablet TAKE ONE (1) TABLET BY MOUTH ONCE DAILY (Patient taking differently: Take 25 mg by mouth daily. ) 90 tablet 1 01/19/2019 at 0730  . ezetimibe (ZETIA) 10 MG tablet Take 1 tablet (10 mg total) by mouth daily. (Patient not taking: Reported on 01/19/2019) 90 tablet 1 Not Taking at Unknown time   Social History   Socioeconomic History  . Marital status: Married    Spouse name: Not on file  . Number of children: 2  . Years of education: Not on file  . Highest education level: Not on file  Occupational History  . Occupation: retired Runner, broadcasting/film/videoteacher  Tobacco Use  . Smoking status: Never Smoker  . Smokeless tobacco: Never Used  Substance and Sexual Activity  . Alcohol use: No    Alcohol/week: 0.0 standard drinks  . Drug use: No  . Sexual activity: Not on file  Other Topics Concern  . Not on file  Social History Narrative   She is an only child. Is a retired Runner, broadcasting/film/videoteacher, is married and has 2 children a son and daughter.   Social Determinants of Health   Financial Resource Strain:   . Difficulty of Paying Living Expenses: Not on file  Food Insecurity:   . Worried About Programme researcher, broadcasting/film/videounning Out of Food in the Last Year: Not on file  . Ran Out of Food in the Last Year: Not on file  Transportation Needs:   . Lack of  Transportation (Medical): Not on file  . Lack of Transportation (Non-Medical): Not on file  Physical Activity:   . Days of Exercise per Week: Not on file  . Minutes of Exercise per Session: Not on file  Stress:   . Feeling of Stress : Not on file  Social Connections:   . Frequency of Communication with Friends and Family: Not on file  . Frequency of Social Gatherings with Friends and Family: Not on file  . Attends Religious Services: Not on file  . Active Member of Clubs or Organizations: Not on file  . Attends Archivist Meetings: Not on file  . Marital Status: Not on file  Intimate Partner Violence:   . Fear of Current or  Ex-Partner: Not on file  . Emotionally Abused: Not on file  . Physically Abused: Not on file  . Sexually Abused: Not on file    Family History  Problem Relation Age of Onset  . Hypertension Father   . Lung cancer Father   . Hypertension Mother   . Dementia Mother   . Arthritis Other   . Breast cancer Cousin        mat cousin  . Colon cancer Neg Hx       Review of systems complete and found to be negative unless listed above      PHYSICAL EXAM  General: Well developed, well nourished, in no acute distress HEENT:  Normocephalic and atramatic Neck:  No JVD.  Lungs: Clear bilaterally to auscultation and percussion. Heart: HRRR . Normal S1 and S2 without gallops or murmurs.  Abdomen: Bowel sounds are positive, abdomen soft and non-tender  Msk:  Back normal, normal gait. Normal strength and tone for age. Extremities: No clubbing, cyanosis or edema.   Neuro: Alert and oriented X 3. Psych:  Good affect, responds appropriately  Labs:   Lab Results  Component Value Date   WBC 11.7 (H) 01/19/2019   HGB 13.8 01/19/2019   HCT 42.2 01/19/2019   MCV 86.7 01/19/2019   PLT 323 01/19/2019    Recent Labs  Lab 01/19/19 1944 01/20/19 0435  NA 139 142  K 5.1 5.3*  CL 104 107  CO2 26 27  BUN 19 17  CREATININE 1.01* 0.95  CALCIUM 9.8 9.4  PROT 7.3  --   BILITOT 0.8  --   ALKPHOS 75  --   ALT 12  --   AST 23  --   GLUCOSE 139* 122*   Lab Results  Component Value Date   TROPONINI <0.03 10/27/2017    Lab Results  Component Value Date   CHOL 232 (H) 01/19/2019   CHOL 246 (H) 12/02/2018   CHOL 226 (H) 01/13/2018   Lab Results  Component Value Date   HDL 39 (L) 01/19/2019   HDL 36.40 (L) 12/02/2018   HDL 38.20 (L) 01/13/2018   Lab Results  Component Value Date   LDLCALC 155 (H) 01/19/2019   LDLCALC 162 (H) 01/13/2018   LDLCALC 144 (H) 09/09/2017   Lab Results  Component Value Date   TRIG 189 (H) 01/19/2019   TRIG 219.0 (H) 12/02/2018   TRIG 126.0 01/13/2018    Lab Results  Component Value Date   CHOLHDL 5.9 01/19/2019   CHOLHDL 7 12/02/2018   CHOLHDL 6 01/13/2018   Lab Results  Component Value Date   LDLDIRECT 169.0 12/02/2018      Radiology: CT Angio Chest PE W and/or Wo Contrast  Result Date: 01/19/2019 CLINICAL DATA:  Code  STEMI, chest pain and shortness of breath EXAM: CT ANGIOGRAPHY CHEST WITH CONTRAST TECHNIQUE: Multidetector CT imaging of the chest was performed using the standard protocol during bolus administration of intravenous contrast. Multiplanar CT image reconstructions and MIPs were obtained to evaluate the vascular anatomy. CONTRAST:  30mL OMNIPAQUE IOHEXOL 350 MG/ML SOLN COMPARISON:  None. FINDINGS: Cardiovascular: There is a optimal opacification of the pulmonary arteries. There is no central,segmental, or subsegmental filling defects within the pulmonary arteries. There is moderate cardiomegaly. No evidence of right ventricular heart strain. Mild coronary artery and aortic atherosclerosis is seen. There is normal three-vessel brachiocephalic anatomy without proximal stenosis. The thoracic aorta is normal in appearance. Mediastinum/Nodes: No hilar, mediastinal, or axillary adenopathy. Thyroid gland, trachea, and esophagus demonstrate no significant findings. Lungs/Pleura: Mild streaky opacity seen at both lung bases. Small area of patchy airspace opacity in the left lung base. No pleural effusion or pneumothorax. Upper Abdomen: A small hiatal hernia present. No acute abnormalities present in the visualized portions of the upper abdomen. Musculoskeletal: No chest wall abnormality. No acute or significant osseous findings. Anterior flowing osteophytes seen within the midthoracic spine. Review of the MIP images confirms the above findings. IMPRESSION: No central, segmental, or subsegmental pulmonary embolism. Streaky/patchy airspace opacity at both lung bases which could be due to atelectasis and/or early infectious etiology. Moderate  cardiomegaly. Electronically Signed   By: Jonna Clark M.D.   On: 01/19/2019 23:30   DG Chest Portable 1 View  Result Date: 01/19/2019 CLINICAL DATA:  Chest pain and shortness of breath. Code STEMI. EXAM: PORTABLE CHEST 1 VIEW COMPARISON:  Radiographs 10/27/2017 and 11/04/2011. FINDINGS: 2006 hours. Lordotic positioning. The heart size and mediastinal contours are stable. There is mild aortic atherosclerosis. The lungs are clear. There is no pleural effusion or pneumothorax. Telemetry leads and external pacing pads are in place. IMPRESSION: No active cardiopulmonary process. Electronically Signed   By: Carey Bullocks M.D.   On: 01/19/2019 20:20    EKG: Improved EKG normal sinus rhythm nonspecific ST-T wave changes nonischemic  ASSESSMENT AND PLAN:  Non-STEMI Elevated troponins Hypertension Hyperlipidemia Known coronary artery disease Diabetes Dementia . Plan Continue telemetry follow-up EKGs and troponins Continue IV heparin for an additional 24 hours Agree with aspirin therapy recommend reducing the dose to 81 mg Start Plavix 75 mg once a day for about 6 months Continue lisinopril 20 mg once a day Maintain metoprolol daily Agree with Zetia for lipid management Consider reinstituting statin therapy as an outpatient Do not recommend invasive strategy will defer cardiac cath for now and treat medically Consider adding Imdur 30 mg once a day If patient remains asymptomatic will consider discharge within the next 48 to 72 hours Follow-up with cardiology 1 to 2 weeks as an outpatient     Signed: Alwyn Pea MD  01/20/2019, 12:58 PM

## 2019-01-21 ENCOUNTER — Emergency Department
Admission: EM | Admit: 2019-01-21 | Discharge: 2019-01-21 | Discharge status: Absent without leave | Payer: Medicare Other

## 2019-01-21 ENCOUNTER — Inpatient Hospital Stay (HOSPITAL_COMMUNITY)
Admission: EM | Admit: 2019-01-21 | Discharge: 2019-01-24 | Disposition: A | Discharge status: Home / Self Care | Payer: Medicare Other | Source: Home / Self Care | Attending: Internal Medicine | Admitting: Internal Medicine

## 2019-01-21 ENCOUNTER — Encounter
Admission: EM | Disposition: A | Discharge status: Home / Self Care | Payer: Self-pay | Source: Home / Self Care | Attending: Internal Medicine

## 2019-01-21 DIAGNOSIS — F0390 Unspecified dementia without behavioral disturbance: Secondary | ICD-10-CM

## 2019-01-21 DIAGNOSIS — I2111 ST elevation (STEMI) myocardial infarction involving right coronary artery: Secondary | ICD-10-CM | POA: Diagnosis present

## 2019-01-21 DIAGNOSIS — E7800 Pure hypercholesterolemia, unspecified: Secondary | ICD-10-CM

## 2019-01-21 DIAGNOSIS — E1159 Type 2 diabetes mellitus with other circulatory complications: Secondary | ICD-10-CM

## 2019-01-21 DIAGNOSIS — M791 Myalgia, unspecified site: Secondary | ICD-10-CM

## 2019-01-21 DIAGNOSIS — I10 Essential (primary) hypertension: Secondary | ICD-10-CM

## 2019-01-21 DIAGNOSIS — W19XXXA Unspecified fall, initial encounter: Secondary | ICD-10-CM

## 2019-01-21 DIAGNOSIS — I200 Unstable angina: Secondary | ICD-10-CM

## 2019-01-21 DIAGNOSIS — I2119 ST elevation (STEMI) myocardial infarction involving other coronary artery of inferior wall: Secondary | ICD-10-CM

## 2019-01-21 DIAGNOSIS — F0280 Dementia in other diseases classified elsewhere without behavioral disturbance: Secondary | ICD-10-CM

## 2019-01-21 DIAGNOSIS — Z0000 Encounter for general adult medical examination without abnormal findings: Secondary | ICD-10-CM

## 2019-01-21 DIAGNOSIS — R0989 Other specified symptoms and signs involving the circulatory and respiratory systems: Secondary | ICD-10-CM

## 2019-01-21 DIAGNOSIS — I25110 Atherosclerotic heart disease of native coronary artery with unstable angina pectoris: Secondary | ICD-10-CM

## 2019-01-21 DIAGNOSIS — R61 Generalized hyperhidrosis: Secondary | ICD-10-CM

## 2019-01-21 DIAGNOSIS — E039 Hypothyroidism, unspecified: Secondary | ICD-10-CM

## 2019-01-21 HISTORY — PX: CORONARY/GRAFT ACUTE MI REVASCULARIZATION: CATH118305

## 2019-01-21 HISTORY — PX: LEFT HEART CATH AND CORONARY ANGIOGRAPHY: CATH118249

## 2019-01-21 LAB — POCT ACTIVATED CLOTTING TIME
Activated Clotting Time: 422 seconds
Activated Clotting Time: 604 seconds

## 2019-01-21 LAB — COMPREHENSIVE METABOLIC PANEL
ALT: 16 U/L (ref 0–44)
AST: 43 U/L — ABNORMAL HIGH (ref 15–41)
Albumin: 3.7 g/dL (ref 3.5–5.0)
Alkaline Phosphatase: 77 U/L (ref 38–126)
Anion gap: 14 (ref 5–15)
BUN: 23 mg/dL (ref 8–23)
CO2: 22 mmol/L (ref 22–32)
Calcium: 9.3 mg/dL (ref 8.9–10.3)
Chloride: 101 mmol/L (ref 98–111)
Creatinine, Ser: 1.18 mg/dL — ABNORMAL HIGH (ref 0.44–1.00)
GFR calc Af Amer: 49 mL/min — ABNORMAL LOW (ref >60)
GFR calc non Af Amer: 43 mL/min — ABNORMAL LOW (ref >60)
Glucose, Bld: 195 mg/dL — ABNORMAL HIGH (ref 70–99)
Potassium: 3.9 mmol/L (ref 3.5–5.1)
Sodium: 137 mmol/L (ref 135–145)
Total Bilirubin: 0.8 mg/dL (ref 0.3–1.2)
Total Protein: 7.5 g/dL (ref 6.5–8.1)

## 2019-01-21 LAB — BASIC METABOLIC PANEL
Anion gap: 11 (ref 5–15)
BUN: 17 mg/dL (ref 8–23)
CO2: 26 mmol/L (ref 22–32)
Calcium: 9.2 mg/dL (ref 8.9–10.3)
Chloride: 103 mmol/L (ref 98–111)
Creatinine, Ser: 1.03 mg/dL — ABNORMAL HIGH (ref 0.44–1.00)
GFR calc Af Amer: 58 mL/min — ABNORMAL LOW (ref >60)
GFR calc non Af Amer: 50 mL/min — ABNORMAL LOW (ref >60)
Glucose, Bld: 132 mg/dL — ABNORMAL HIGH (ref 70–99)
Potassium: 3.9 mmol/L (ref 3.5–5.1)
Sodium: 140 mmol/L (ref 135–145)

## 2019-01-21 LAB — CBC
HCT: 42.5 % (ref 36.0–46.0)
Hemoglobin: 14 g/dL (ref 12.0–15.0)
MCH: 27.9 pg (ref 26.0–34.0)
MCHC: 32.9 g/dL (ref 30.0–36.0)
MCV: 84.8 fL (ref 80.0–100.0)
Platelets: 290 10*3/uL (ref 150–400)
RBC: 5.01 MIL/uL (ref 3.87–5.11)
RDW: 13.9 % (ref 11.5–15.5)
WBC: 10.9 10*3/uL — ABNORMAL HIGH (ref 4.0–10.5)
nRBC: 0 % (ref 0.0–0.2)

## 2019-01-21 LAB — CBC WITH DIFFERENTIAL/PLATELET
Abs Immature Granulocytes: 0.05 10*3/uL (ref 0.00–0.07)
Basophils Absolute: 0.1 10*3/uL (ref 0.0–0.1)
Basophils Relative: 1 %
Eosinophils Absolute: 0.1 10*3/uL (ref 0.0–0.5)
Eosinophils Relative: 1 %
HCT: 44.8 % (ref 36.0–46.0)
Hemoglobin: 15.1 g/dL — ABNORMAL HIGH (ref 12.0–15.0)
Immature Granulocytes: 0 %
Lymphocytes Relative: 9 %
Lymphs Abs: 1.4 10*3/uL (ref 0.7–4.0)
MCH: 27.6 pg (ref 26.0–34.0)
MCHC: 33.7 g/dL (ref 30.0–36.0)
MCV: 81.9 fL (ref 80.0–100.0)
Monocytes Absolute: 1.2 10*3/uL — ABNORMAL HIGH (ref 0.1–1.0)
Monocytes Relative: 8 %
Neutro Abs: 12.1 10*3/uL — ABNORMAL HIGH (ref 1.7–7.7)
Neutrophils Relative %: 81 %
Platelets: 293 10*3/uL (ref 150–400)
RBC: 5.47 MIL/uL — ABNORMAL HIGH (ref 3.87–5.11)
RDW: 14 % (ref 11.5–15.5)
WBC: 14.9 10*3/uL — ABNORMAL HIGH (ref 4.0–10.5)
nRBC: 0 % (ref 0.0–0.2)

## 2019-01-21 LAB — TROPONIN I (HIGH SENSITIVITY)
Troponin I (High Sensitivity): 2130 ng/L (ref <18)
Troponin I (High Sensitivity): 3243 ng/L (ref <18)
Troponin I (High Sensitivity): 5254 ng/L (ref <18)
Troponin I (High Sensitivity): 7882 ng/L (ref <18)

## 2019-01-21 LAB — PROTIME-INR
INR: 1 (ref 0.8–1.2)
Prothrombin Time: 13.4 seconds (ref 11.4–15.2)

## 2019-01-21 LAB — HEPARIN LEVEL (UNFRACTIONATED): Heparin Unfractionated: 0.17 IU/mL — ABNORMAL LOW (ref 0.30–0.70)

## 2019-01-21 LAB — GLUCOSE, CAPILLARY
Glucose-Capillary: 123 mg/dL — ABNORMAL HIGH (ref 70–99)
Glucose-Capillary: 113 mg/dL — ABNORMAL HIGH (ref 70–99)

## 2019-01-21 LAB — ECHOCARDIOGRAM COMPLETE
Height: 64 in
Weight: 2589.08 oz

## 2019-01-21 LAB — MRSA PCR SCREENING: MRSA by PCR: NEGATIVE

## 2019-01-21 LAB — APTT: aPTT: 30 seconds (ref 24–36)

## 2019-01-21 SURGERY — CORONARY/GRAFT ACUTE MI REVASCULARIZATION
Anesthesia: Moderate Sedation

## 2019-01-21 MED ORDER — HYDRALAZINE HCL 20 MG/ML IJ SOLN: 10.0000 mg | INTRAMUSCULAR | Status: DC | PRN

## 2019-01-21 MED ORDER — SODIUM CHLORIDE 0.9 % IV SOLN
INTRAVENOUS | Status: AC | PRN
  Administered 2019-01-21: 1.75 mg/kg/h via INTRAVENOUS

## 2019-01-21 MED ORDER — SODIUM CHLORIDE 0.9 % WEIGHT BASED INFUSION
1.0000 mL/kg/h | INTRAVENOUS | Status: AC
  Administered 2019-01-21: 1 mL/kg/h via INTRAVENOUS

## 2019-01-21 MED ORDER — ONDANSETRON HCL 4 MG/2ML IJ SOLN: 4.0000 mg | Freq: Four times a day (QID) | INTRAMUSCULAR | Status: DC | PRN

## 2019-01-21 MED ORDER — ASPIRIN 81 MG PO TBEC: 81.0000 mg | DELAYED_RELEASE_TABLET | Freq: Every day | ORAL | Status: DC

## 2019-01-21 MED ORDER — IOHEXOL 300 MG/ML  SOLN
INTRAMUSCULAR | Status: DC | PRN
  Administered 2019-01-21: 260 mL

## 2019-01-21 MED ORDER — DEXMEDETOMIDINE HCL IN NACL 400 MCG/100ML IV SOLN: 0.4000 ug/kg/h | INTRAVENOUS | Status: DC

## 2019-01-21 MED ORDER — LEVOTHYROXINE SODIUM 50 MCG PO TABS
75.0000 ug | ORAL_TABLET | Freq: Every day | ORAL | Status: DC
  Administered 2019-01-22 – 2019-01-24 (×3): 75 ug via ORAL
  Filled 2019-01-21 (×4): qty 1

## 2019-01-21 MED ORDER — LISINOPRIL 20 MG PO TABS
20.0000 mg | ORAL_TABLET | Freq: Every day | ORAL | Status: DC
  Administered 2019-01-23 – 2019-01-24 (×2): 20 mg via ORAL
  Filled 2019-01-21 (×3): qty 1

## 2019-01-21 MED ORDER — BIVALIRUDIN TRIFLUOROACETATE 250 MG IV SOLR
INTRAVENOUS | Status: AC
  Filled 2019-01-21: qty 250

## 2019-01-21 MED ORDER — METOPROLOL SUCCINATE ER 25 MG PO TB24: 25.0000 mg | ORAL_TABLET | Freq: Two times a day (BID) | ORAL | Status: DC

## 2019-01-21 MED ORDER — AMLODIPINE BESYLATE 5 MG PO TABS
5.0000 mg | ORAL_TABLET | Freq: Every day | ORAL | Status: DC
  Filled 2019-01-21 (×2): qty 1

## 2019-01-21 MED ORDER — DONEPEZIL HCL 5 MG PO TABS
10.0000 mg | ORAL_TABLET | Freq: Every day | ORAL | Status: DC
  Administered 2019-01-21 – 2019-01-23 (×3): 10 mg via ORAL
  Filled 2019-01-21 (×4): qty 2

## 2019-01-21 MED ORDER — METOPROLOL TARTRATE 5 MG/5ML IV SOLN
INTRAVENOUS | Status: AC
  Filled 2019-01-21: qty 5

## 2019-01-21 MED ORDER — GUAIFENESIN-DM 100-10 MG/5ML PO SYRP
5.0000 mL | ORAL_SOLUTION | ORAL | Status: DC | PRN
  Administered 2019-01-21: 5 mL via ORAL
  Filled 2019-01-21: qty 5

## 2019-01-21 MED ORDER — SODIUM CHLORIDE 0.9% FLUSH: 3.0000 mL | INTRAVENOUS | Status: DC | PRN

## 2019-01-21 MED ORDER — HEPARIN (PORCINE) IN NACL 1000-0.9 UT/500ML-% IV SOLN
INTRAVENOUS | Status: DC | PRN
  Administered 2019-01-21: 1000 mL

## 2019-01-21 MED ORDER — ASPIRIN 81 MG PO CHEW
81.0000 mg | CHEWABLE_TABLET | Freq: Every day | ORAL | Status: DC
  Administered 2019-01-21 – 2019-01-24 (×4): 81 mg via ORAL
  Filled 2019-01-21 (×4): qty 1

## 2019-01-21 MED ORDER — HEPARIN SODIUM (PORCINE) 5000 UNIT/ML IJ SOLN
INTRAMUSCULAR | Status: AC
  Filled 2019-01-21: qty 1

## 2019-01-21 MED ORDER — LABETALOL HCL 5 MG/ML IV SOLN: 10.0000 mg | INTRAVENOUS | Status: DC | PRN

## 2019-01-21 MED ORDER — CLOPIDOGREL BISULFATE 75 MG PO TABS
600.0000 mg | ORAL_TABLET | Freq: Once | ORAL | Status: AC
  Administered 2019-01-21: 600 mg via ORAL

## 2019-01-21 MED ORDER — SODIUM CHLORIDE 0.9 % IV SOLN: 250.0000 mL | INTRAVENOUS | Status: DC | PRN

## 2019-01-21 MED ORDER — METOPROLOL TARTRATE 25 MG PO TABS
25.0000 mg | ORAL_TABLET | Freq: Two times a day (BID) | ORAL | Status: DC
  Administered 2019-01-21 – 2019-01-24 (×5): 25 mg via ORAL
  Filled 2019-01-21 (×6): qty 1

## 2019-01-21 MED ORDER — CLOPIDOGREL BISULFATE 75 MG PO TABS
75.0000 mg | ORAL_TABLET | Freq: Every day | ORAL | Status: DC
  Administered 2019-01-22 – 2019-01-24 (×3): 75 mg via ORAL
  Filled 2019-01-21 (×3): qty 1

## 2019-01-21 MED ORDER — ACETAMINOPHEN 325 MG PO TABS
650.0000 mg | ORAL_TABLET | ORAL | Status: DC | PRN
  Administered 2019-01-22: 650 mg via ORAL
  Filled 2019-01-21: qty 2

## 2019-01-21 MED ORDER — CLOPIDOGREL BISULFATE 75 MG PO TABS: 75.0000 mg | ORAL_TABLET | Freq: Every day | ORAL | 0 refills | Status: DC

## 2019-01-21 MED ORDER — BIVALIRUDIN BOLUS VIA INFUSION - CUPID
INTRAVENOUS | Status: DC | PRN
  Administered 2019-01-21: 55.05 mg via INTRAVENOUS

## 2019-01-21 MED ORDER — AMLODIPINE BESYLATE 5 MG PO TABS
5.0000 mg | ORAL_TABLET | Freq: Every day | ORAL | Status: DC
  Administered 2019-01-21: 5 mg via ORAL
  Filled 2019-01-21: qty 1

## 2019-01-21 MED ORDER — AMLODIPINE BESYLATE 5 MG PO TABS: 5.0000 mg | ORAL_TABLET | Freq: Every day | ORAL | 0 refills | Status: DC

## 2019-01-21 MED ORDER — EZETIMIBE 10 MG PO TABS
10.0000 mg | ORAL_TABLET | Freq: Every day | ORAL | Status: DC
  Administered 2019-01-21 – 2019-01-24 (×4): 10 mg via ORAL
  Filled 2019-01-21 (×4): qty 1

## 2019-01-21 MED ORDER — SODIUM CHLORIDE 0.9% FLUSH
3.0000 mL | Freq: Two times a day (BID) | INTRAVENOUS | Status: DC
  Administered 2019-01-22 – 2019-01-24 (×5): 3 mL via INTRAVENOUS

## 2019-01-21 MED ORDER — METOPROLOL SUCCINATE ER 25 MG PO TB24: 25.0000 mg | ORAL_TABLET | Freq: Every day | ORAL | 0 refills | Status: DC

## 2019-01-21 MED ORDER — ATORVASTATIN CALCIUM 80 MG PO TABS
80.0000 mg | ORAL_TABLET | Freq: Every day | ORAL | Status: DC
  Administered 2019-01-22: 80 mg via ORAL
  Filled 2019-01-21: qty 4

## 2019-01-21 MED ORDER — METOPROLOL TARTRATE 5 MG/5ML IV SOLN
INTRAVENOUS | Status: DC | PRN
  Administered 2019-01-21: 2.5 mg via INTRAVENOUS

## 2019-01-21 MED ORDER — SODIUM CHLORIDE 0.9 % IV SOLN
0.2500 mg/kg/h | INTRAVENOUS | Status: AC
  Filled 2019-01-21: qty 250

## 2019-01-21 SURGICAL SUPPLY — 17 items
BALLN EUPHORA RX 2.5X15 (BALLOONS) ×3
BALLN ~~LOC~~ EUPHORA RX 3.5X15 (BALLOONS) ×3
BALLOON EUPHORA RX 2.5X15 (BALLOONS) ×1 IMPLANT
BALLOON ~~LOC~~ EUPHORA RX 3.5X15 (BALLOONS) ×1 IMPLANT
CATH INFINITI 5FR ANG PIGTAIL (CATHETERS) ×3 IMPLANT
CATH INFINITI 5FR JL4 (CATHETERS) ×3 IMPLANT
CATH INFINITI 5FR JL5 (CATHETERS) ×3 IMPLANT
CATH VISTA GUIDE 6FR JR4 SH (CATHETERS) ×3 IMPLANT
DEVICE CLOSURE MYNXGRIP 6/7F (Vascular Products) ×3 IMPLANT
DEVICE INFLAT 30 PLUS (MISCELLANEOUS) ×3 IMPLANT
KIT MANI 3VAL PERCEP (MISCELLANEOUS) ×3 IMPLANT
NEEDLE PERC 18GX7CM (NEEDLE) ×3 IMPLANT
PACK CARDIAC CATH (CUSTOM PROCEDURE TRAY) ×3 IMPLANT
SHEATH AVANTI 6FR X 11CM (SHEATH) ×3 IMPLANT
STENT RESOLUTE ONYX 3.0X22 (Permanent Stent) ×3 IMPLANT
WIRE G HI TQ BMW 190 (WIRE) ×3 IMPLANT
WIRE GUIDERIGHT .035X150 (WIRE) ×3 IMPLANT

## 2019-01-21 NOTE — Consult Note (Signed)
[facility] for Heparin Infusion Indication: chest pain/ACS  Allergies  Allergen Reactions  . Sulfa Antibiotics     Patient Measurements: Height: 5\' 4"  (162.6 cm) Weight: 161 lb 13.1 oz (73.4 kg) IBW/kg (Calculated) : 54.7 Heparin Dosing Weight: 69.3 kg  Vital Signs: Temp: 98 F (36.7 C) (12/25 0325) Temp Source: Oral (12/25 0325) BP: 154/67 (12/25 0325) Pulse Rate: 82 (12/25 0325)  Labs: Recent Labs    01/19/19 1944 01/20/19 0108 01/20/19 0435 01/20/19 1115 01/20/19 1314 01/21/19 0309  HGB 13.8  --   --   --   --  14.0  HCT 42.2  --   --   --   --  42.5  PLT 323  --   --   --   --  290  APTT 30  --   --   --   --   --   LABPROT 12.5  --   --   --   --   --   INR 0.9  --   --   --   --   --   HEPARINUNFRC  --   --  0.35  --  0.51 0.17*  CREATININE 1.01*  --  0.95  --   --  1.03*  TROPONINIHS 127* 1,265* 2,287* 2,156*  --   --     Estimated Creatinine Clearance: 40.6 mL/min (A) (by C-G formula based on SCr of 1.03 mg/dL (H)).   Medical History: Past Medical History:  Diagnosis Date  . Carotid bruit    bilaterally  . Dementia ([place])   . Hypercholesterolemia   . Hypertension   . Hypothyroidism     Medications:  Medications Prior to Admission  Medication Sig Dispense Refill Last Dose  . aspirin 325 MG EC tablet Take 325 mg by mouth daily.   01/19/2019 at 0730  . donepezil (ARICEPT) 10 MG tablet Take 1 tablet (10 mg total) by mouth at bedtime. 90 tablet 3 01/18/2019 at 2100  . levothyroxine (SYNTHROID) 75 MCG tablet 1/2 tablet q day (Patient taking differently: Take 37.5 mcg by mouth daily before breakfast. 1/2 tablet q day) 90 tablet 3 01/19/2019 at 0630  . lisinopril (PRINIVIL,ZESTRIL) 20 MG tablet Take 1 tablet (20 mg total) by mouth daily. 90 tablet 3 01/19/2019 at 0730  . metoprolol succinate (TOPROL-XL) 25 MG 24 hr tablet TAKE ONE (1) TABLET BY MOUTH ONCE DAILY (Patient taking differently: Take 25 mg by mouth daily. )  90 tablet 1 01/19/2019 at 0730  . ezetimibe (ZETIA) 10 MG tablet Take 1 tablet (10 mg total) by mouth daily. (Patient not taking: Reported on 01/19/2019) 90 tablet 1 Not Taking at Unknown time   Scheduled:  . aspirin  81 mg Oral Daily  . clopidogrel  75 mg Oral Daily  . donepezil  10 mg Oral QHS  . insulin aspart  0-5 Units Subcutaneous QHS  . insulin aspart  0-9 Units Subcutaneous Q4H  . levothyroxine  37.5 mcg Oral QAC breakfast  . lisinopril  20 mg Oral Daily  . metoprolol succinate  25 mg Oral Daily  . sodium chloride flush  10 mL Intravenous Q12H   Infusions:  . heparin 900 Units/hr (01/21/19 0459)   PRN:  Anti-infectives (From admission, onward)   None      Assessment: Pharmacy has been consulted to initiate Heparin Drip on 85yo patient complaining of chest pain and shortness of breath. Presented as a code STEMI but since patient was  stable in the emergency room, the ED physician deemed that it was not a STEMI but to continue aggressive medical therapy. Patient has no history of anticoagulant use PTA. Plan to continue heparin for another 24 hours per Cards.  12/24 @ 0435 HL = 0.35, therapeutic x 1  12/24 @1314  HL = 0.51, therapeutic.  12/25 @ 0309 HL = 0.17, subtherapeutic, CBC OK.  RN states IV was held due to IV access issues so likely why HL dropped to subtherapeutic level.  Heparin restarted at same rate and recheck HL in 8 hrs.  Goal of Therapy:  Heparin level 0.3-0.7 units/ml Monitor platelets by anticoagulation protocol: Yes   Plan:  Resume heparin rate at 900 units/hr. Will order heparin level recheck in 8 hours.  CBC daily    1/26, PharmD 01/21/2019,5:11 AM

## 2019-01-21 NOTE — Plan of Care (Signed)
  Problem: Education: Goal: Knowledge of General Education information will improve Description: Including pain rating scale, medication(s)/side effects and non-pharmacologic comfort measures Outcome: Progressing   Problem: Clinical Measurements: Goal: Diagnostic test results will improve Outcome: Progressing   Problem: Activity: Goal: Risk for activity intolerance will decrease Outcome: Progressing   

## 2019-01-21 NOTE — Progress Notes (Signed)
Amanda [name] to be D/C'd Home per MD order.  Discussed prescriptions and follow up appointments with the patient and husband. Prescriptions were e-prescribedt, medication list explained in detail. Pt and husband verbalized understanding.  Allergies as of 01/21/2019      Reactions   Sulfa Antibiotics       Medication List    STOP taking these medications   lisinopril 20 MG tablet Commonly known as: ZESTRIL     TAKE these medications   amLODipine 5 MG tablet Commonly known as: NORVASC Take 1 tablet (5 mg total) by mouth daily. Start taking on: January 22, 2019   aspirin 81 MG EC tablet Take 1 tablet (81 mg total) by mouth daily. Start taking on: January 22, 2019 What changed:   medication strength  how much to take   clopidogrel 75 MG tablet Commonly known as: PLAVIX Take 1 tablet (75 mg total) by mouth daily. Start taking on: January 22, 2019   donepezil 10 MG tablet Commonly known as: Aricept Take 1 tablet (10 mg total) by mouth at bedtime.   ezetimibe 10 MG tablet Commonly known as: Zetia Take 1 tablet (10 mg total) by mouth daily.   levothyroxine 75 MCG tablet Commonly known as: Synthroid 1/2 tablet q day What changed:   how much to take  how to take this  when to take this   metoprolol succinate 25 MG 24 hr tablet Commonly known as: TOPROL-XL Take 1 tablet (25 mg total) by mouth daily. What changed: See the new instructions.       Vitals:   01/21/19 1209 01/21/19 1511  BP: (!) 148/78 (!) 144/62  Pulse: 86 85  Resp:  18  Temp:  98.4 F (36.9 C)  SpO2: 97% 97%    Tele box removed and returned.Skin clean, dry and intact without evidence of skin break down, no evidence of skin tears noted. IV catheter discontinued intact. Site without signs and symptoms of complications. Dressing and pressure applied. Pt denies pain at this time. No complaints noted.  An After Visit Summary was printed and given to the patient. Husband and grandson here to  pick up patient. Patient escorted via [place], and D/C home via private auto.  Rolley Sims

## 2019-01-21 NOTE — Consult Note (Signed)
Name: Amanda [name] MRN: [id number] DOB: 1933/07/23    ADMISSION DATE:  01/21/2019 CONSULTATION DATE: 01/21/2019  REFERRING MD : Dr. Juliann Pares  CHIEF COMPLAINT: Diaphoresis   HISTORY OF PRESENT ILLNESS:   This is an 85 yo female with a PMH of Hypothyroidism, HTN, Hypercholesterolemia, Dementia, and Bilateral Carotid Bruit.  She presented to [facility] ER on 12/25 with significant diaphoresis.  She was discharged during the earlier part of the day on 01/21/19 with chest pain and borderline EKG that did not seem to meet STEMI criteria, therefore she was managed medically.  During current ER presentation EKG revealed ST elevation inferiorly and reciprocal depressions laterally, therefore code STEMI initiated.  She was transported emergently to cardiac cath lab and required drug eluting stent placement in the proximal RCA due to 95% stenosis.  She was subsequently admitted to ICU post procedure for additional workup and treatment.   SIGNIFICANT EVENTS/STUDIES:  12/25: Pt presented to [facility] ER EKG ruled pt in for STEMI, therefore pt transported emergently to cardiac cath lab 12/25: Cardiac cath findings were mild reduced overall left ventricular function around 45% globally Insignificant disease in the left main LAD and circumflex. Successful PCI and stent of mid RCA lesion 95% reduced to 0% after deployment and placement of a DES 3.0 x 22 mm stent. Failed minx deployment, placement of FemoStop overnight. Cardiology recommended patient to be maintained on aspirin, plavix, and statin  12/25: Pt admitted post cath to the stepdown unit   PAST MEDICAL HISTORY :   has a past medical history of Carotid bruit, Dementia (HCC), Hypercholesterolemia, Hypertension, and Hypothyroidism.  has a past surgical history that includes Carpal tunnel release; Cholecystectomy (1991); and Thoracic aorta stent (12/13). Prior to Admission medications   Medication Sig Start Date End Date Taking? Authorizing Provider  amLODipine  (NORVASC) 5 MG tablet Take 1 tablet (5 mg total) by mouth daily. 01/22/19   Lynn Ito, MD  aspirin EC 81 MG EC tablet Take 1 tablet (81 mg total) by mouth daily. 01/22/19   Lynn Ito, MD  clopidogrel (PLAVIX) 75 MG tablet Take 1 tablet (75 mg total) by mouth daily. 01/22/19   Lynn Ito, MD  donepezil (ARICEPT) 10 MG tablet Take 1 tablet (10 mg total) by mouth at bedtime. 01/15/18   Dale Millbrook, MD  ezetimibe (ZETIA) 10 MG tablet Take 1 tablet (10 mg total) by mouth daily. Patient not taking: Reported on 01/19/2019 01/15/18   Dale Mendon, MD  levothyroxine (SYNTHROID) 75 MCG tablet 1/2 tablet q day Patient taking differently: Take 37.5 mcg by mouth daily before breakfast. 1/2 tablet q day 01/15/18   Dale Ellsworth, MD  metoprolol succinate (TOPROL-XL) 25 MG 24 hr tablet Take 1 tablet (25 mg total) by mouth daily. 01/21/19   Lynn Ito, MD   Allergies  Allergen Reactions  . Sulfa Antibiotics     FAMILY HISTORY:  family history includes Arthritis in an other family member; Breast cancer in her cousin; Dementia in her mother; Hypertension in her father and mother; Lung cancer in her father. SOCIAL HISTORY:  reports that she has never smoked. She has never used smokeless tobacco. She reports that she does not drink alcohol or use drugs.  REVIEW OF SYSTEMS: Positives in BOLD  Gen: diaphoresis, fever, chills, weight change, fatigue, night sweats HEENT: Denies blurred vision, double vision, hearing loss, tinnitus, sinus congestion, rhinorrhea, sore throat, neck stiffness, dysphagia PULM: Denies shortness of breath, cough, sputum production, hemoptysis, wheezing CV: Denies chest pain, edema, orthopnea, paroxysmal  nocturnal dyspnea, palpitations GI: Denies abdominal pain, nausea, vomiting, diarrhea, hematochezia, melena, constipation, change in bowel habits GU: Denies dysuria, hematuria, polyuria, oliguria, urethral discharge Endocrine: Denies hot or cold intolerance, polyuria,  polyphagia or appetite change Derm: Denies rash, dry skin, scaling or peeling skin change Heme: Denies easy bruising, bleeding, bleeding gums Neuro: Denies headache, numbness, weakness, slurred speech, loss of memory or consciousness  SUBJECTIVE:  No complaints at this time  VITAL SIGNS: Temp:  [97.8 F (36.6 C)-98.4 F (36.9 C)] 98 F (36.7 C) (12/25 2059) Pulse Rate:  [58-97] 58 (12/25 2059) Resp:  [17-24] 24 (12/25 2059) BP: (99-163)/(56-78) 99/56 (12/25 2059) SpO2:  [96 %-98 %] 96 % (12/25 2000) Weight:  [73.4 kg] 73.4 kg (12/25 0325)  PHYSICAL EXAMINATION: General: elderly female resting in bed, NAD  Neuro: alert, oriented to self only  HEENT: supple, no JVD Cardiovascular: sinus rhythm with depressed T wave, no R/G; 1+ bilateral palpable distal pulses   Lungs: clear throughout, even, non labored  Abdomen: +BS x4, obese, soft, non distended  Musculoskeletal: normal bulk and tone, no edema  Skin: right groin PAD in place at vascular site no bleeding or hematoma present, however ecchymosis present   Recent Labs  Lab 01/20/19 0435 01/21/19 0309 01/21/19 1726  NA 142 140 137  K 5.3* 3.9 3.9  CL 107 103 101  CO2 27 26 22   BUN 17 17 23   CREATININE 0.95 1.03* 1.18*  GLUCOSE 122* 132* 195*   Recent Labs  Lab 01/19/19 1944 01/21/19 0309 01/21/19 1726  HGB 13.8 14.0 15.1*  HCT 42.2 42.5 44.8  WBC 11.7* 10.9* 14.9*  PLT 323 290 293   CT Angio Chest PE W and/or Wo Contrast  Result Date: 01/19/2019 CLINICAL DATA:  Code STEMI, chest pain and shortness of breath EXAM: CT ANGIOGRAPHY CHEST WITH CONTRAST TECHNIQUE: Multidetector CT imaging of the chest was performed using the standard protocol during bolus administration of intravenous contrast. Multiplanar CT image reconstructions and MIPs were obtained to evaluate the vascular anatomy. CONTRAST:  75mL OMNIPAQUE IOHEXOL 350 MG/ML SOLN COMPARISON:  None. FINDINGS: Cardiovascular: There is a optimal opacification of the  pulmonary arteries. There is no central,segmental, or subsegmental filling defects within the pulmonary arteries. There is moderate cardiomegaly. No evidence of right ventricular heart strain. Mild coronary artery and aortic atherosclerosis is seen. There is normal three-vessel brachiocephalic anatomy without proximal stenosis. The thoracic aorta is normal in appearance. Mediastinum/Nodes: No hilar, mediastinal, or axillary adenopathy. Thyroid gland, trachea, and esophagus demonstrate no significant findings. Lungs/Pleura: Mild streaky opacity seen at both lung bases. Small area of patchy airspace opacity in the left lung base. No pleural effusion or pneumothorax. Upper Abdomen: A small hiatal hernia present. No acute abnormalities present in the visualized portions of the upper abdomen. Musculoskeletal: No chest wall abnormality. No acute or significant osseous findings. Anterior flowing osteophytes seen within the midthoracic spine. Review of the MIP images confirms the above findings. IMPRESSION: No central, segmental, or subsegmental pulmonary embolism. Streaky/patchy airspace opacity at both lung bases which could be due to atelectasis and/or early infectious etiology. Moderate cardiomegaly. Electronically Signed   By: Jonna ClarkBindu  Avutu M.D.   On: 01/19/2019 23:30   CARDIAC CATHETERIZATION  Result Date: 01/21/2019  Prox RCA lesion is 95% stenosed.  Ost LAD lesion is 25% stenosed.  A drug-eluting stent was successfully placed using a STENT RESOLUTE ONYX 3.0X22.  Post intervention, there is a 0% residual stenosis.  Mild reduced left ventricular function global ejection fraction around  45%  Insignificant disease and LAD left main circumflex  Conclusion Mild reduced overall left ventricular function around 45% globally Insignificant disease in the left main LAD and circumflex Successful PCI and stent of mid RCA lesion 95% reduced to 0% after deployment and placement of a DES 3.0 x 22 mm stent Failed minx  deployment Placement of FemoStop overnight Patient to be maintained on aspirin Plavix Statin was restarted   ECHOCARDIOGRAM COMPLETE  Result Date: 01/21/2019   ECHOCARDIOGRAM REPORT   Patient Name:   ADEL NEYER Date of Exam: 01/20/2019 Medical Rec #:  [id number]     Height:       64.0 in Accession #:    [id number]    Weight:       161.8 lb Date of Birth:  1933-11-13     BSA:          1.79 m Patient Age:    85 years      BP:           172/76 mmHg Patient Gender: F             HR:           80 bpm. Exam Location:  [facility] Procedure: 2D Echo, Cardiac Doppler and Color Doppler Indications:     Chest pain 786.50  History:         Patient has no prior history of Echocardiogram examinations.                  Risk Factors:Hypertension. Dementia.  Sonographer:     Cristela Blue RDCS (AE) Referring Phys:  Kenn File DOUTOVA Diagnosing Phys: Alwyn Pea MD  Sonographer Comments: Suboptimal apical window. IMPRESSIONS  1. Left ventricular ejection fraction, by visual estimation, is 50 to 55%. The left ventricle has normal function. Mildly increased left ventricular posterior wall thickness. There is borderline left ventricular hypertrophy.  2. The left ventricle has no regional wall motion abnormalities.  3. Global right ventricle has normal systolic function.The right ventricular size is normal. No increase in right ventricular wall thickness.  4. Left atrial size was normal.  5. Right atrial size was normal.  6. The mitral valve is normal in structure. Trivial mitral valve regurgitation.  7. The tricuspid valve is normal in structure.  8. The aortic valve is normal in structure. Aortic valve regurgitation is not visualized.  9. The pulmonic valve was grossly normal. Pulmonic valve regurgitation is not visualized. 10. Normal pulmonary artery systolic pressure. FINDINGS  Left Ventricle: Left ventricular ejection fraction, by visual estimation, is 50 to 55%. The left ventricle has normal function. The left ventricle  has no regional wall motion abnormalities. Mildly increased left ventricular posterior wall thickness. There is borderline left ventricular hypertrophy. Right Ventricle: The right ventricular size is normal. No increase in right ventricular wall thickness. Global RV systolic function is has normal systolic function. The tricuspid regurgitant velocity is 2.13 m/s, and with an assumed right atrial pressure  of 10 mmHg, the estimated right ventricular systolic pressure is normal at 28.1 mmHg. Left Atrium: Left atrial size was normal in size. Right Atrium: Right atrial size was normal in size Pericardium: There is no evidence of pericardial effusion. Mitral Valve: The mitral valve is normal in structure. Trivial mitral valve regurgitation. Tricuspid Valve: The tricuspid valve is normal in structure. Tricuspid valve regurgitation is mild. Aortic Valve: The aortic valve is normal in structure. Aortic valve regurgitation is not visualized. Aortic valve mean gradient measures 2.0 mmHg.  Aortic valve peak gradient measures 4.5 mmHg. Aortic valve area, by VTI measures 2.62 cm. Pulmonic Valve: The pulmonic valve was grossly normal. Pulmonic valve regurgitation is not visualized. Pulmonic regurgitation is not visualized. Aorta: The aortic root is normal in size and structure. IAS/Shunts: No atrial level shunt detected by color flow Doppler.  LEFT VENTRICLE PLAX 2D LVIDd:         3.78 cm  Diastology LVIDs:         2.76 cm  LV e' lateral:   4.46 cm/s LV PW:         1.27 cm  LV E/e' lateral: 10.2 LV IVS:        0.94 cm  LV e' medial:    4.79 cm/s LVOT diam:     2.00 cm  LV E/e' medial:  9.5 LV SV:         33 ml LV SV Index:   17.75 LVOT Area:     3.14 cm  RIGHT VENTRICLE RV Basal diam:  2.40 cm RV S prime:     21.40 cm/s TAPSE (M-mode): 3.5 cm LEFT ATRIUM             Index       RIGHT ATRIUM           Index LA diam:        3.50 cm 1.96 cm/m  RA Area:     14.30 cm LA Vol (A2C):   53.9 ml 30.15 ml/m RA Volume:   29.90 ml  16.72  ml/m LA Vol (A4C):   31.1 ml 17.39 ml/m LA Biplane Vol: 43.6 ml 24.39 ml/m  AORTIC VALVE                   PULMONIC VALVE AV Area (Vmax):    2.53 cm    PV Vmax:        0.76 m/s AV Area (Vmean):   2.36 cm    PV Peak grad:   2.3 mmHg AV Area (VTI):     2.62 cm    RVOT Peak grad: 3 mmHg AV Vmax:           106.50 cm/s AV Vmean:          67.800 cm/s AV VTI:            0.188 m AV Peak Grad:      4.5 mmHg AV Mean Grad:      2.0 mmHg LVOT Vmax:         85.70 cm/s LVOT Vmean:        50.900 cm/s LVOT VTI:          0.157 m LVOT/AV VTI ratio: 0.84  AORTA Ao Root diam: 2.70 cm MITRAL VALVE                         TRICUSPID VALVE MV Area (PHT): 2.34 cm              TR Peak grad:   18.1 mmHg MV PHT:        93.96 msec            TR Vmax:        213.00 cm/s MV Decel Time: 324 msec MV E velocity: 45.60 cm/s  103 cm/s  SHUNTS MV A velocity: 108.00 cm/s 70.3 cm/s Systemic VTI:  0.16 m MV E/A ratio:  0.42        1.5  Systemic Diam: 2.00 cm  Alwyn Pea MD Electronically signed by Alwyn Pea MD Signature Date/Time: 01/21/2019/10:13:29 AM    Final     ASSESSMENT / PLAN:  STEMI secondary to 95% stenosis of proximal RCA s/p DES placement  HTN  Hx: Hypercholesterolemia  Continuous telemetry monitoring  Prn supplemental O2 for dyspnea and/or hypoxia  Cardiology consulted appreciate input-continue cardiac medications per recommendations  Right groin PAD to remain in place overnight per cardiology-vascular assessments per protocol    Hypothyroidism  Continue synthroid  Dementia   Continue outpatient donepezil    Sonda Rumble, AGNP  Pulmonary/Critical Care Pager [phone] (please enter 7 digits) PCCM Consult Pager [phone] (please enter 7 digits)

## 2019-01-21 NOTE — ED Notes (Signed)
Pt presents today with hs dmentia. Denies CP; diaphoretic; 324 mg ASA.

## 2019-01-21 NOTE — Consult Note (Signed)
CARDIOLOGY CONSULT NOTE               Patient ID: Amanda [name] MRN: [id number] DOB/AGE: [birth year] 85 y.o.  Admit date: 01/21/2019 Referring Physician Dr. Mort [facility] Primary Physician Dr. Worthy KeelerBurt Klein primary Primary Cardiologist Dr. Gwen PoundsKowalski cardiologist Reason for Consultation STEMI inferior wall  HPI: 85 year old white female known coronary disease hypertension hyperlipidemia lipidemia severe dementia who was recently admitted a few days ago with borderline EKG initially called a code STEMI but EKG did not seem to meet criteria but then she got the emergency room EKG normalized and she states that she was asymptomatic never had any chest pain the patient was on telemetry did not experience any chest pain symptoms she had mild hypertension no shortness of breath but with her dementia became very difficult to assess.  Because of her demented status and appearing to be asymptomatic we tried to treat the patient conservatively.  Patient was discharged home today and within a very short time had to return as a family describes she got diaphoretic and had shakes EKG by EMS showed definite ST elevation inferiorly reciprocal depressions laterally.  The patient was presented as a code STEMI.  Emergency room provided heparin 5000 units 600 mg of Plavix as well as 325 mg of aspirin provided by EMS and and cardiology was called for treatment of STEMI patient was hemodynamically stable EKG improved patient states she was not having any chest pain but with the initial EKG meeting criteria she was destined for cardiac Cath Lab with intention to treat with PCI and stent if possible  Review of systems complete and found to be negative unless listed above     Past Medical History:  Diagnosis Date  . Carotid bruit    bilaterally  . Dementia (HCC)   . Hypercholesterolemia   . Hypertension   . Hypothyroidism     Past Surgical History:  Procedure Laterality Date  . CARPAL TUNNEL RELEASE    .  CHOLECYSTECTOMY  1991  . THORACIC AORTA STENT  12/13   heart stent    Medications Prior to Admission  Medication Sig Dispense Refill Last Dose  . [START ON 01/22/2019] amLODipine (NORVASC) 5 MG tablet Take 1 tablet (5 mg total) by mouth daily. 30 tablet 0   . [START ON 01/22/2019] aspirin EC 81 MG EC tablet Take 1 tablet (81 mg total) by mouth daily.     Amanda [name]. [START ON 01/22/2019] clopidogrel (PLAVIX) 75 MG tablet Take 1 tablet (75 mg total) by mouth daily. 30 tablet 0   . donepezil (ARICEPT) 10 MG tablet Take 1 tablet (10 mg total) by mouth at bedtime. 90 tablet 3   . ezetimibe (ZETIA) 10 MG tablet Take 1 tablet (10 mg total) by mouth daily. (Patient not taking: Reported on 01/19/2019) 90 tablet 1   . levothyroxine (SYNTHROID) 75 MCG tablet 1/2 tablet q day (Patient taking differently: Take 37.5 mcg by mouth daily before breakfast. 1/2 tablet q day) 90 tablet 3   . metoprolol succinate (TOPROL-XL) 25 MG 24 hr tablet Take 1 tablet (25 mg total) by mouth daily. 30 tablet 0    Social History   Socioeconomic History  . Marital status: Married    Spouse name: Not on file  . Number of children: 2  . Years of education: Not on file  . Highest education level: Not on file  Occupational History  . Occupation: retired Runner, broadcasting/film/videoteacher  Tobacco Use  . Smoking status: Never Smoker  .  Smokeless tobacco: Never Used  Substance and Sexual Activity  . Alcohol use: No    Alcohol/week: 0.0 standard drinks  . Drug use: No  . Sexual activity: Not on file  Other Topics Concern  . Not on file  Social History Narrative   She is an only child. 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   Social Determinants of Health   Financial Resource Strain:   . Difficulty of Paying Living Expenses: Not on file  Food Insecurity:   . Worried About Programme researcher, broadcasting/film/video in the Last Year: Not on file  . Ran Out of Food in the Last Year: Not on file  Transportation Needs:   . Lack of  Transportation (Medical): Not on file  . Lack of Transportation (Non-Medical): Not on file  Physical Activity:   . Days of Exercise per Week: Not on file  . Minutes of Exercise per Session: Not on file  Stress:   . Feeling of Stress : Not on file  Social Connections:   . Frequency of Communication with Friends and Family: Not on file  . Frequency of Social Gatherings with Friends and Family: Not on file  . Attends Religious Services: Not on file  . Active Member of Clubs or Organizations: Not on file  . Attends Banker Meetings: Not on file  . Marital Status: Not on file  Intimate Partner Violence:   . Fear of Current or Ex-Partner: Not on file  . Emotionally Abused: Not on file  . Physically Abused: Not on file  . Sexually Abused: Not on file    Family History  Problem Relation Age of Onset  . Hypertension Father   . Lung cancer Father   . Hypertension Mother   . Dementia Mother   . Arthritis Other   . Breast cancer Cousin        mat cousin  . Colon cancer Neg Hx       Review of systems complete and found to be negative unless listed above      PHYSICAL EXAM  General: Well developed, well nourished, in no acute distress HEENT:  Normocephalic and atramatic Neck:  No JVD.  Lungs: Clear bilaterally to auscultation and percussion. Heart: HRRR . Normal S1 and S2 without gallops or murmurs.  Abdomen: Bowel sounds are positive, abdomen soft and non-tender  Msk:  Back normal, normal gait. Normal strength and tone for age. Extremities: No clubbing, cyanosis or edema.   Neuro: Alert and oriented X 3. Psych:  Good affect, responds appropriately  Labs:   Lab Results  Component Value Date   WBC 14.9 (H) 01/21/2019   HGB 15.1 (H) 01/21/2019   HCT 44.8 01/21/2019   MCV 81.9 01/21/2019   PLT 293 01/21/2019    Recent Labs  Lab 01/21/19 1726  NA 137  K 3.9  CL 101  CO2 22  BUN 23  CREATININE 1.18*  CALCIUM 9.3  PROT 7.5  BILITOT 0.8  ALKPHOS 77    ALT 16  AST 43*  GLUCOSE 195*   Lab Results  Component Value Date   TROPONINI <0.03 10/27/2017    Lab Results  Component Value Date   CHOL 232 (H) 01/19/2019   CHOL 246 (H) 12/02/2018   CHOL 226 (H) 01/13/2018   Lab Results  Component Value Date   HDL 39 (L) 01/19/2019   HDL 36.40 (L) 12/02/2018   HDL 38.20 (L) 01/13/2018   Lab  Results  Component Value Date   LDLCALC 155 (H) 01/19/2019   LDLCALC 162 (H) 01/13/2018   LDLCALC 144 (H) 09/09/2017   Lab Results  Component Value Date   TRIG 189 (H) 01/19/2019   TRIG 219.0 (H) 12/02/2018   TRIG 126.0 01/13/2018   Lab Results  Component Value Date   CHOLHDL 5.9 01/19/2019   CHOLHDL 7 12/02/2018   CHOLHDL 6 01/13/2018   Lab Results  Component Value Date   LDLDIRECT 169.0 12/02/2018      Radiology: CT Angio Chest PE W and/or Wo Contrast  Result Date: 01/19/2019 CLINICAL DATA:  Code STEMI, chest pain and shortness of breath EXAM: CT ANGIOGRAPHY CHEST WITH CONTRAST TECHNIQUE: Multidetector CT imaging of the chest was performed using the standard protocol during bolus administration of intravenous contrast. Multiplanar CT image reconstructions and MIPs were obtained to evaluate the vascular anatomy. CONTRAST:  104mL OMNIPAQUE IOHEXOL 350 MG/ML SOLN COMPARISON:  None. FINDINGS: Cardiovascular: There is a optimal opacification of the pulmonary arteries. There is no central,segmental, or subsegmental filling defects within the pulmonary arteries. There is moderate cardiomegaly. No evidence of right ventricular heart strain. Mild coronary artery and aortic atherosclerosis is seen. There is normal three-vessel brachiocephalic anatomy without proximal stenosis. The thoracic aorta is normal in appearance. Mediastinum/Nodes: No hilar, mediastinal, or axillary adenopathy. Thyroid gland, trachea, and esophagus demonstrate no significant findings. Lungs/Pleura: Mild streaky opacity seen at both lung bases. Small area of patchy airspace opacity  in the left lung base. No pleural effusion or pneumothorax. Upper Abdomen: A small hiatal hernia present. No acute abnormalities present in the visualized portions of the upper abdomen. Musculoskeletal: No chest wall abnormality. No acute or significant osseous findings. Anterior flowing osteophytes seen within the midthoracic spine. Review of the MIP images confirms the above findings. IMPRESSION: No central, segmental, or subsegmental pulmonary embolism. Streaky/patchy airspace opacity at both lung bases which could be due to atelectasis and/or early infectious etiology. Moderate cardiomegaly. Electronically Signed   By: Prudencio Pair M.D.   On: 01/19/2019 23:30   DG Chest Portable 1 View  Result Date: 01/19/2019 CLINICAL DATA:  Chest pain and shortness of breath. Code STEMI. EXAM: PORTABLE CHEST 1 VIEW COMPARISON:  Radiographs 10/27/2017 and 11/04/2011. FINDINGS: 2006 hours. Lordotic positioning. The heart size and mediastinal contours are stable. There is mild aortic atherosclerosis. The lungs are clear. There is no pleural effusion or pneumothorax. Telemetry leads and external pacing pads are in place. IMPRESSION: No active cardiopulmonary process. Electronically Signed   By: Richardean Sale M.D.   On: 01/19/2019 20:20   ECHOCARDIOGRAM COMPLETE  Result Date: 01/21/2019   ECHOCARDIOGRAM REPORT   Patient Name:   CHABELY NORBY Date of Exam: 01/20/2019 Medical Rec #:  [id number]     Height:       64.0 in Accession #:    [id number]    Weight:       161.8 lb Date of Birth:  [birth year]     BSA:          1.79 m Patient Age:    43 years      BP:           172/76 mmHg Patient Gender: F             HR:           80 bpm. Exam Location:  [facility] Procedure: 2D Echo, Cardiac Doppler and Color Doppler Indications:     Chest pain 786.50  History:  Patient has no prior history of Echocardiogram examinations.                  Risk Factors:Hypertension. Dementia.  Sonographer:     Cristela Blue RDCS (AE) Referring Phys:   Kenn File DOUTOVA Diagnosing Phys: Alwyn Pea MD  Sonographer Comments: Suboptimal apical window. IMPRESSIONS  1. Left ventricular ejection fraction, by visual estimation, is 50 to 55%. The left ventricle has normal function. Mildly increased left ventricular posterior wall thickness. There is borderline left ventricular hypertrophy.  2. The left ventricle has no regional wall motion abnormalities.  3. Global right ventricle has normal systolic function.The right ventricular size is normal. No increase in right ventricular wall thickness.  4. Left atrial size was normal.  5. Right atrial size was normal.  6. The mitral valve is normal in structure. Trivial mitral valve regurgitation.  7. The tricuspid valve is normal in structure.  8. The aortic valve is normal in structure. Aortic valve regurgitation is not visualized.  9. The pulmonic valve was grossly normal. Pulmonic valve regurgitation is not visualized. 10. Normal pulmonary artery systolic pressure. FINDINGS  Left Ventricle: Left ventricular ejection fraction, by visual estimation, is 50 to 55%. The left ventricle has normal function. The left ventricle has no regional wall motion abnormalities. Mildly increased left ventricular posterior wall thickness. There is borderline left ventricular hypertrophy. Right Ventricle: The right ventricular size is normal. No increase in right ventricular wall thickness. Global RV systolic function is has normal systolic function. The tricuspid regurgitant velocity is 2.13 m/s, and with an assumed right atrial pressure  of 10 mmHg, the estimated right ventricular systolic pressure is normal at 28.1 mmHg. Left Atrium: Left atrial size was normal in size. Right Atrium: Right atrial size was normal in size Pericardium: There is no evidence of pericardial effusion. Mitral Valve: The mitral valve is normal in structure. Trivial mitral valve regurgitation. Tricuspid Valve: The tricuspid valve is normal in structure.  Tricuspid valve regurgitation is mild. Aortic Valve: The aortic valve is normal in structure. Aortic valve regurgitation is not visualized. Aortic valve mean gradient measures 2.0 mmHg. Aortic valve peak gradient measures 4.5 mmHg. Aortic valve area, by VTI measures 2.62 cm. Pulmonic Valve: The pulmonic valve was grossly normal. Pulmonic valve regurgitation is not visualized. Pulmonic regurgitation is not visualized. Aorta: The aortic root is normal in size and structure. IAS/Shunts: No atrial level shunt detected by color flow Doppler.  LEFT VENTRICLE PLAX 2D LVIDd:         3.78 cm  Diastology LVIDs:         2.76 cm  LV e' lateral:   4.46 cm/s LV PW:         1.27 cm  LV E/e' lateral: 10.2 LV IVS:        0.94 cm  LV e' medial:    4.79 cm/s LVOT diam:     2.00 cm  LV E/e' medial:  9.5 LV SV:         33 ml LV SV Index:   17.75 LVOT Area:     3.14 cm  RIGHT VENTRICLE RV Basal diam:  2.40 cm RV S prime:     21.40 cm/s TAPSE (M-mode): 3.5 cm LEFT ATRIUM             Index       RIGHT ATRIUM           Index LA diam:        3.50 cm  1.96 cm/m  RA Area:     14.30 cm LA Vol (A2C):   53.9 ml 30.15 ml/m RA Volume:   29.90 ml  16.72 ml/m LA Vol (A4C):   31.1 ml 17.39 ml/m LA Biplane Vol: 43.6 ml 24.39 ml/m  AORTIC VALVE                   PULMONIC VALVE AV Area (Vmax):    2.53 cm    PV Vmax:        0.76 m/s AV Area (Vmean):   2.36 cm    PV Peak grad:   2.3 mmHg AV Area (VTI):     2.62 cm    RVOT Peak grad: 3 mmHg AV Vmax:           106.50 cm/s AV Vmean:          67.800 cm/s AV VTI:            0.188 m AV Peak Grad:      4.5 mmHg AV Mean Grad:      2.0 mmHg LVOT Vmax:         85.70 cm/s LVOT Vmean:        50.900 cm/s LVOT VTI:          0.157 m LVOT/AV VTI ratio: 0.84  AORTA Ao Root diam: 2.70 cm MITRAL VALVE                         TRICUSPID VALVE MV Area (PHT): 2.34 cm              TR Peak grad:   18.1 mmHg MV PHT:        93.96 msec            TR Vmax:        213.00 cm/s MV Decel Time: 324 msec MV E velocity: 45.60 cm/s   103 cm/s  SHUNTS MV A velocity: 108.00 cm/s 70.3 cm/s Systemic VTI:  0.16 m MV E/A ratio:  0.42        1.5       Systemic Diam: 2.00 cm  Offie Pickron D Andray Assefa MD Electronically signed by Alwyn Pea MD Signature Date/Time: 01/21/2019/10:13:29 AM    Final     EKG: Initial EKG in the field had sinus rhythm ST elevation inferiorly reciprocal depressions laterally  ASSESSMENT AND PLAN:  STEMI IMI presentation Recent non-STEMI unstable angina Hypertension Hyperlipidemia Diabetes Severe dementia . Plan Status post PCI and stent to RCA with DES Continue aspirin Plavix Agree with beta-blockade therapy Continue ACE inhibitor therapy Add amlodipine to help with blood pressure management Continue statin therapy for hyperlipidemia FemoStop in place after the procedure to help with hemostasis of right groin Continue Aricept for dementia Currently on thyroid medication Case discussed with husband and granddaughter Emiliy Case discussed with ICU attending Dr. Belia Heman  Signed: Alwyn Pea MD 01/21/2019, 7:03 PM

## 2019-01-21 NOTE — Progress Notes (Signed)
[facility] Cardiology    SUBJECTIVE: Dementia so history is very limited but consistently denies any angina   Vitals:   01/20/19 1536 01/20/19 1914 01/21/19 0325 01/21/19 0740  BP: (!) 159/79 (!) 143/90 (!) 154/67 (!) 163/72  Pulse: 93 98 82 90  Resp: 19 18 20 19   Temp: 98 F (36.7 C) 98.5 F (36.9 C) 98 F (36.7 C) 97.8 F (36.6 C)  TempSrc: Oral Oral Oral Oral  SpO2: 96% 95% 97% 96%  Weight:   73.4 kg   Height:         Intake/Output Summary (Last 24 hours) at 01/21/2019 1117 Last data filed at 01/21/2019 01/23/2019 Gross per 24 hour  Intake 1002.49 ml  Output 1000 ml  Net 2.49 ml      PHYSICAL EXAM  General: Well developed, well nourished, in no acute distress HEENT:  Normocephalic and atramatic Neck:  No JVD.  Lungs: Clear bilaterally to auscultation and percussion. Heart: HRRR . Normal S1 and S2 without gallops or murmurs.  Abdomen: Bowel sounds are positive, abdomen soft and non-tender  Msk:  Back normal, normal gait. Normal strength and tone for age. Extremities: No clubbing, cyanosis or edema.   Neuro: Alert and oriented X 3. Psych:  Good affect, responds appropriately   LABS: Basic Metabolic Panel: Recent Labs    01/20/19 0435 01/21/19 0309  NA 142 140  K 5.3* 3.9  CL 107 103  CO2 27 26  GLUCOSE 122* 132*  BUN 17 17  CREATININE 0.95 1.03*  CALCIUM 9.4 9.2   Liver Function Tests: Recent Labs    01/19/19 1944  AST 23  ALT 12  ALKPHOS 75  BILITOT 0.8  PROT 7.3  ALBUMIN 3.7   Recent Labs    01/19/19 1944  LIPASE 29   CBC: Recent Labs    01/19/19 1944 01/21/19 0309  WBC 11.7* 10.9*  NEUTROABS 8.4*  --   HGB 13.8 14.0  HCT 42.2 42.5  MCV 86.7 84.8  PLT 323 290   Cardiac Enzymes: No results for input(s): CKTOTAL, CKMB, CKMBINDEX, TROPONINI in the last 72 hours. BNP: Invalid input(s): POCBNP D-Dimer: No results for input(s): DDIMER in the last 72 hours. Hemoglobin A1C: Recent Labs    01/20/19 0435  HGBA1C 6.5*   Fasting Lipid  Panel: Recent Labs    01/19/19 1944  CHOL 232*  HDL 39*  LDLCALC 155*  TRIG 189*  CHOLHDL 5.9   Thyroid Function Tests: No results for input(s): TSH, T4TOTAL, T3FREE, THYROIDAB in the last 72 hours.  Invalid input(s): FREET3 Anemia Panel: No results for input(s): VITAMINB12, FOLATE, FERRITIN, TIBC, IRON, RETICCTPCT in the last 72 hours.  CT Angio Chest PE W and/or Wo Contrast  Result Date: 01/19/2019 CLINICAL DATA:  Code STEMI, chest pain and shortness of breath EXAM: CT ANGIOGRAPHY CHEST WITH CONTRAST TECHNIQUE: Multidetector CT imaging of the chest was performed using the standard protocol during bolus administration of intravenous contrast. Multiplanar CT image reconstructions and MIPs were obtained to evaluate the vascular anatomy. CONTRAST:  27mL OMNIPAQUE IOHEXOL 350 MG/ML SOLN COMPARISON:  None. FINDINGS: Cardiovascular: There is a optimal opacification of the pulmonary arteries. There is no central,segmental, or subsegmental filling defects within the pulmonary arteries. There is moderate cardiomegaly. No evidence of right ventricular heart strain. Mild coronary artery and aortic atherosclerosis is seen. There is normal three-vessel brachiocephalic anatomy without proximal stenosis. The thoracic aorta is normal in appearance. Mediastinum/Nodes: No hilar, mediastinal, or axillary adenopathy. Thyroid gland, trachea, and esophagus demonstrate no  significant findings. Lungs/Pleura: Mild streaky opacity seen at both lung bases. Small area of patchy airspace opacity in the left lung base. No pleural effusion or pneumothorax. Upper Abdomen: A small hiatal hernia present. No acute abnormalities present in the visualized portions of the upper abdomen. Musculoskeletal: No chest wall abnormality. No acute or significant osseous findings. Anterior flowing osteophytes seen within the midthoracic spine. Review of the MIP images confirms the above findings. IMPRESSION: No central, segmental, or  subsegmental pulmonary embolism. Streaky/patchy airspace opacity at both lung bases which could be due to atelectasis and/or early infectious etiology. Moderate cardiomegaly. Electronically Signed   By: Jonna Clark M.D.   On: 01/19/2019 23:30   DG Chest Portable 1 View  Result Date: 01/19/2019 CLINICAL DATA:  Chest pain and shortness of breath. Code STEMI. EXAM: PORTABLE CHEST 1 VIEW COMPARISON:  Radiographs 10/27/2017 and 11/04/2011. FINDINGS: 2006 hours. Lordotic positioning. The heart size and mediastinal contours are stable. There is mild aortic atherosclerosis. The lungs are clear. There is no pleural effusion or pneumothorax. Telemetry leads and external pacing pads are in place. IMPRESSION: No active cardiopulmonary process. Electronically Signed   By: Carey Bullocks M.D.   On: 01/19/2019 20:20   ECHOCARDIOGRAM COMPLETE  Result Date: 01/21/2019   ECHOCARDIOGRAM REPORT   Patient Name:   Amanda [name] Date of Exam: 01/20/2019 Medical Rec #:  [id number]     Height:       64.0 in Accession #:    [id number]    Weight:       161.8 lb Date of Birth:  11/18/1933     BSA:          1.79 m Patient Age:    85 years      BP:           172/76 mmHg Patient Gender: F             HR:           80 bpm. Exam Location:  [facility] Procedure: 2D Echo, Cardiac Doppler and Color Doppler Indications:     Chest pain 786.50  History:         Patient has no prior history of Echocardiogram examinations.                  Risk Factors:Hypertension. Dementia.  Sonographer:     Cristela Blue RDCS (AE) Referring Phys:  Kenn File DOUTOVA Diagnosing Phys: Alwyn Pea MD  Sonographer Comments: Suboptimal apical window. IMPRESSIONS  1. Left ventricular ejection fraction, by visual estimation, is 50 to 55%. The left ventricle has normal function. Mildly increased left ventricular posterior wall thickness. There is borderline left ventricular hypertrophy.  2. The left ventricle has no regional wall motion abnormalities.  3. Global  right ventricle has normal systolic function.The right ventricular size is normal. No increase in right ventricular wall thickness.  4. Left atrial size was normal.  5. Right atrial size was normal.  6. The mitral valve is normal in structure. Trivial mitral valve regurgitation.  7. The tricuspid valve is normal in structure.  8. The aortic valve is normal in structure. Aortic valve regurgitation is not visualized.  9. The pulmonic valve was grossly normal. Pulmonic valve regurgitation is not visualized. 10. Normal pulmonary artery systolic pressure. FINDINGS  Left Ventricle: Left ventricular ejection fraction, by visual estimation, is 50 to 55%. The left ventricle has normal function. The left ventricle has no regional wall motion abnormalities. Mildly increased left ventricular posterior wall  thickness. There is borderline left ventricular hypertrophy. Right Ventricle: The right ventricular size is normal. No increase in right ventricular wall thickness. Global RV systolic function is has normal systolic function. The tricuspid regurgitant velocity is 2.13 m/s, and with an assumed right atrial pressure  of 10 mmHg, the estimated right ventricular systolic pressure is normal at 28.1 mmHg. Left Atrium: Left atrial size was normal in size. Right Atrium: Right atrial size was normal in size Pericardium: There is no evidence of pericardial effusion. Mitral Valve: The mitral valve is normal in structure. Trivial mitral valve regurgitation. Tricuspid Valve: The tricuspid valve is normal in structure. Tricuspid valve regurgitation is mild. Aortic Valve: The aortic valve is normal in structure. Aortic valve regurgitation is not visualized. Aortic valve mean gradient measures 2.0 mmHg. Aortic valve peak gradient measures 4.5 mmHg. Aortic valve area, by VTI measures 2.62 cm. Pulmonic Valve: The pulmonic valve was grossly normal. Pulmonic valve regurgitation is not visualized. Pulmonic regurgitation is not visualized. Aorta:  The aortic root is normal in size and structure. IAS/Shunts: No atrial level shunt detected by color flow Doppler.  LEFT VENTRICLE PLAX 2D LVIDd:         3.78 cm  Diastology LVIDs:         2.76 cm  LV e' lateral:   4.46 cm/s LV PW:         1.27 cm  LV E/e' lateral: 10.2 LV IVS:        0.94 cm  LV e' medial:    4.79 cm/s LVOT diam:     2.00 cm  LV E/e' medial:  9.5 LV SV:         33 ml LV SV Index:   17.75 LVOT Area:     3.14 cm  RIGHT VENTRICLE RV Basal diam:  2.40 cm RV S prime:     21.40 cm/s TAPSE (M-mode): 3.5 cm LEFT ATRIUM             Index       RIGHT ATRIUM           Index LA diam:        3.50 cm 1.96 cm/m  RA Area:     14.30 cm LA Vol (A2C):   53.9 ml 30.15 ml/m RA Volume:   29.90 ml  16.72 ml/m LA Vol (A4C):   31.1 ml 17.39 ml/m LA Biplane Vol: 43.6 ml 24.39 ml/m  AORTIC VALVE                   PULMONIC VALVE AV Area (Vmax):    2.53 cm    PV Vmax:        0.76 m/s AV Area (Vmean):   2.36 cm    PV Peak grad:   2.3 mmHg AV Area (VTI):     2.62 cm    RVOT Peak grad: 3 mmHg AV Vmax:           106.50 cm/s AV Vmean:          67.800 cm/s AV VTI:            0.188 m AV Peak Grad:      4.5 mmHg AV Mean Grad:      2.0 mmHg LVOT Vmax:         85.70 cm/s LVOT Vmean:        50.900 cm/s LVOT VTI:          0.157 m LVOT/AV VTI ratio: 0.84  AORTA Ao  Root diam: 2.70 cm MITRAL VALVE                         TRICUSPID VALVE MV Area (PHT): 2.34 cm              TR Peak grad:   18.1 mmHg MV PHT:        93.96 msec            TR Vmax:        213.00 cm/s MV Decel Time: 324 msec MV E velocity: 45.60 cm/s  103 cm/s  SHUNTS MV A velocity: 108.00 cm/s 70.3 cm/s Systemic VTI:  0.16 m MV E/A ratio:  0.42        1.5       Systemic Diam: 2.00 cm  Hussien Greenblatt D Charisse Wendell MD Electronically signed by Yolonda Kida MD Signature Date/Time: 01/21/2019/10:13:29 AM    Final      Echo preserved overall left ventricular function no clear wall motion abnormalities EF of around 55%  TELEMETRY: Normal sinus rhythm on telemetry no ST segment  changes no arrhythmias:  ASSESSMENT AND PLAN:  Active Problems:   Hypertension, essential   Hypercholesteremia   Advanced dementia (HCC)   Hypothyroidism   CAD (coronary artery disease)   Diabetes mellitus with circulatory complication (HCC)   Unstable angina pectoris ([place]) Non-STEMI presentation minimal troponin rise    . Plan Discontinue heparin anticoagulant Increase activity ambulate with assistance Consistently denies any anginal symptoms Add amlodipine 5 mg daily to help with blood pressure support Continue diabetes management Will address hyperlipidemia issues as an outpatient and some concerns about taking statins in the past.  Continue Zetia for now may consider Repatha as an outpatient Case discussed with her husband yesterday Should be okay for discharge home today Follow-up with cardiology Dr. Nehemiah Massed 1 to 2 weeks   Yolonda Kida, MD 01/21/2019 11:17 AM

## 2019-01-21 NOTE — ED Provider Notes (Signed)
[facility] Emergency Department Provider Note  ____________________________________________   First MD Initiated Contact with Patient 01/21/19 1724     (approximate)  I have reviewed the triage vital signs and the nursing notes.  History  Chief Complaint No chief complaint on file.    HPI Amanda [name] is a 85 y.o. female with a history of dementia, CAD, HTN, HLD, discharged earlier today after admission from 12/23-12/25 for exertional chest pain and shortness of breath. On her initial arrival on 12/23 she had borderline inferior EKG changes with EMS, however by arrival to the ED/during admission her EKG changes had resolved, therefore recommended against invasive strategy and deferment of catheterization in favor for medical optimization.  This afternoon, she had an episode of significant diaphoresis, which prompted her daughter to call EMS.  The patient herself has no complaints.  She specifically denies any chest pain, shortness of breath, nausea, or any other complaints.   EKG with EMS today revealed ST elevations in II, III, aVF with reciprocal depressions. Given 324 mg ASA and nitro paste applied.   On arrival to ED, repeat EKG will mild improvement compared to EMS, but ST elevations still present in inferior leads.   Caveat: hx obtained from chart review, EMS, and patient, but limited 2/2 to her dementia.    Past Medical Hx Past Medical History:  Diagnosis Date  . Carotid bruit    bilaterally  . Dementia (HCC)   . Hypercholesterolemia   . Hypertension   . Hypothyroidism     Problem List Patient Active Problem List   Diagnosis Date Noted  . Unstable angina pectoris (HCC) 01/19/2019  . Abdominal bruit 09/14/2017  . Muscle ache 03/06/2017  . Fall 12/31/2014  . Health care maintenance 05/20/2014  . Diabetes mellitus with circulatory complication (HCC) 05/17/2013  . CAD (coronary artery disease) 05/15/2012  . Osteopenia 12/14/2011  .  Hypertension, essential 11/13/2011  . Hypercholesteremia 11/13/2011  . Advanced dementia (HCC) 11/13/2011  . Hypothyroidism 11/13/2011    Past Surgical Hx Past Surgical History:  Procedure Laterality Date  . CARPAL TUNNEL RELEASE    . CHOLECYSTECTOMY  1991  . THORACIC AORTA STENT  12/13   heart stent    Medications Prior to Admission medications   Medication Sig Start Date End Date Taking? Authorizing Provider  amLODipine (NORVASC) 5 MG tablet Take 1 tablet (5 mg total) by mouth daily. 01/22/19   Lynn Ito, MD  aspirin EC 81 MG EC tablet Take 1 tablet (81 mg total) by mouth daily. 01/22/19   Lynn Ito, MD  clopidogrel (PLAVIX) 75 MG tablet Take 1 tablet (75 mg total) by mouth daily. 01/22/19   Lynn Ito, MD  donepezil (ARICEPT) 10 MG tablet Take 1 tablet (10 mg total) by mouth at bedtime. 01/15/18   Dale [place], MD  ezetimibe (ZETIA) 10 MG tablet Take 1 tablet (10 mg total) by mouth daily. Patient not taking: Reported on 01/19/2019 01/15/18   Dale [place], MD  levothyroxine (SYNTHROID) 75 MCG tablet 1/2 tablet q day Patient taking differently: Take 37.5 mcg by mouth daily before breakfast. 1/2 tablet q day 01/15/18   Dale [place], MD  metoprolol succinate (TOPROL-XL) 25 MG 24 hr tablet Take 1 tablet (25 mg total) by mouth daily. 01/21/19   Lynn Ito, MD    Allergies Sulfa antibiotics  Family Hx Family History  Problem Relation Age of Onset  . Hypertension Father   . Lung cancer Father   . Hypertension Mother   .  Dementia Mother   . Arthritis Other   . Breast cancer Cousin        mat cousin  . Colon cancer Neg Hx     Social Hx Social History   Tobacco Use  . Smoking status: Never Smoker  . Smokeless tobacco: Never Used  Substance Use Topics  . Alcohol use: No    Alcohol/week: 0.0 standard drinks  . Drug use: No     Review of Systems  Constitutional: Negative for fever, chills. + diaphoresis Eyes: Negative for visual changes. ENT:  Negative for sore throat. Cardiovascular: Negative for chest pain. Respiratory: Negative for shortness of breath. Gastrointestinal: Negative for nausea, vomiting.  Genitourinary: Negative for dysuria. Musculoskeletal: Negative for leg swelling. Skin: Negative for rash. Neurological: Negative for for headaches.   Physical Exam  Vital Signs: ED Triage Vitals [01/21/19 1720]  Enc Vitals Group     BP      Pulse Rate 97     Resp 18     Temp      Temp src      SpO2 96 %     Weight      Height      Head Circumference      Peak Flow      Pain Score      Pain Loc      Pain Edu?      Excl. in [place]?     Constitutional: Awake and alert.  Pleasantly demented.  Oriented to self, place.  Not oriented to time. Head: Normocephalic. Atraumatic. Eyes: Conjunctivae clear. Sclera anicteric. Nose: No congestion. No rhinorrhea. Mouth/Throat: Wearing mask.  Neck: No stridor.   Cardiovascular: Normal rate, regular rhythm. Extremities well perfused. Respiratory: Normal respiratory effort.   Musculoskeletal: No lower extremity edema. No deformities. Neurologic:  Normal speech and language. No gross focal neurologic deficits are appreciated.  Skin: Skin is warm, dry and intact. No rash noted. Psychiatric: Mood and affect are appropriate for situation.  EKG  Personally reviewed. Obtained 01/21/19 at 17:22  Rate: 94 Rhythm: sinus Axis: normal Intervals: WNL ST elevation inferiorly (II, III, aVF), also with TWI in lateral precordial leads w/ subtle reciprocal changes in I and AVL  EMS EKG reviewed, obtained 16:39 Rate: 90 Rhythm: sinus Axis: normal Intervals: within normal limits Marked ST elevations inferiorly, and in the lateral precordial leads, with reciprocal changes in 1 and aVL    Radiology  N/A   Procedures  Procedure(s) performed (including critical care):  .Critical Care Performed by: Lilia Pro., MD Authorized by: Lilia Pro., MD   Critical care provider  statement:    Critical care time (minutes):  20   Critical care was time spent personally by me on the following activities:  Discussions with consultants, evaluation of patient's response to treatment, examination of patient, ordering and performing treatments and interventions, ordering and review of laboratory studies, ordering and review of radiographic studies, pulse oximetry, re-evaluation of patient's condition, obtaining history from patient or surrogate and review of old charts     Initial Impression / Assessment and Plan / ED Course  85 y.o. female with known history of CAD who presents to the ED for acute onset of diaphoresis, found to have a STEMI on EMS EKG.  As above.  Repeat EKG on arrival continues to demonstrate inferior STEMI.  HDS on arrival.  Received ASA and nitro paste with EMS.  Pleasantly demented, no complaints.  Code STEMI was activated in the field PTA.  Discussed  with cardiology, Dr. Juliann Paresallwood, who will take to the Cath Lab.  Given 5000 units heparin, 600 mg Plavix, basic labs obtained, and transported immediately to the Cath Lab with cardiology.   Final Clinical Impression(s) / ED Diagnosis  Final diagnoses:  Diaphoresis  Acute ST elevation myocardial infarction (STEMI) of inferior wall [facility](HCC)       Note:  This document was prepared using Dragon voice recognition software and may include unintentional dictation errors.   Miguel AschoffMonks, Summer Mccolgan L., MD 01/21/19 [phone]

## 2019-01-21 NOTE — ED Notes (Signed)
Dr. Callwood at bedside at this time.  

## 2019-01-21 NOTE — Plan of Care (Signed)
  Problem: Education: Goal: Knowledge of General Education information will improve Description Including pain rating scale, medication(s)/side effects and non-pharmacologic comfort measures Outcome: Progressing   Problem: Activity: Goal: Risk for activity intolerance will decrease Outcome: Progressing   Problem: Safety: Goal: Ability to remain free from injury will improve Outcome: Progressing   

## 2019-01-21 NOTE — ED Notes (Signed)
EDP Monks at bediside upon arrival.

## 2019-01-21 NOTE — Progress Notes (Signed)
Ch received a code STEMI regarding the pt who is a 54 YOF that was recently d/c. Ch assessed that the pt did not present to be in significant emotional distress and had a pleasant affect. Pt responded moderately appropriately to R.A.C.E. scale yet the pt has blockage that may require a cardio consult. Ch will f/u at a later time.  F/u recommended.    01/21/19 1700  Clinical Encounter Type  Visited With Patient;Health care provider  Visit Type Social support;ED  Referral From Nurse  Consult/Referral To Chaplain  Spiritual Encounters  Spiritual Needs Emotional;Grief support  Stress Factors  Patient Stress Factors Exhausted;Health changes  Family Stress Factors None identified

## 2019-01-21 NOTE — Progress Notes (Signed)
eLink Physician-Brief Progress Note Patient Name: Amanda [name] DOB: 28-Jul-1933 MRN: [id number]   Date of Service  01/21/2019  HPI/Events of Note  Pt admitted via ED by cardiology and taken straight to the cath lab for PCA and DES placement, Dr, Clayborn Bigness discussed with Dr. Mortimer Fries who is to see PT in a.m. Pt is clinically stable currently.  eICU Interventions  New PT evaluation completed.        Kerry Kass Maycee Blasco 01/21/2019, 8:36 PM

## 2019-01-21 NOTE — Discharge Summary (Signed)
Amanda [name] XNA:[id number] DOB: 12-Nov-1933 DOA: 01/19/2019  PCP: Amanda Ashmore, MD  Admit date: 01/19/2019 Discharge date: 01/21/2019  Admitted From: Home Disposition:  home  Recommendations for Outpatient Follow-up:  1. Follow up with PCP in 1 week 2. Please obtain BMP/CBC in one week 3. Please follow up on the following pending results:none 4. Follow-up with Dr. Gwen Pounds cardiology in 1-2 weeks  Home Health: No   Discharge Condition:Stable CODE STATUS: Full Diet recommendation: Heart Healthy  Brief/Interim Summary: Amanda Wogan Mullisis a 85 y.o.femalewith medical history significant of DM2, CAD, HTN, hypothyroidism, PVD presented withExertional chest painwhile vacuuming associated with some shortness of [place] [facility] was called initial ECG wasworrisomefor ischemic changes. Patient was brought into emergency department as a code STEMI her chest pain has rapidly resolved Repeat EKG in the ER showing no evidence of STEMI She was noted to have elevated troponin and cardiology has been consulted code STEMI was canceled and cardiology requested medicine to admit for unstable angina.  She was started on heparin drip aspirin statin and Plavix .  Cardiology recommended Plavix for 6 months.  She had a CTA that was negative for PE.  Echo was obtained revealing EF of 50 to 55%.  Neurology recommended continue Zetia and possibly Repatha as outpatient.  They did not recommend invasive strategy with deferment of cardiac cath for now and treat medically.  She remained asymptomatic throughout the hospitalization.  Cardiology cleared her to be discharged today.  Her blood pressure was also managed with institution of amlodipine and beta-blockers.  Her ACE inhibitor was discontinued during her potassium was 5.3 on admission.  She is asymptomatic and stable to be discharged home with follow-up with her primary care for blood pressure and diabetes management.  Discharge Diagnoses:  Active Problems:    Hypertension, essential   Hypercholesteremia   Advanced dementia (HCC)   Hypothyroidism   CAD (coronary artery disease)   Diabetes mellitus with circulatory complication (HCC)   Unstable angina pectoris [facility])    Discharge Instructions  Discharge Instructions    Call MD for:  difficulty breathing, headache or visual disturbances   Complete by: As directed    Diet - low sodium heart healthy   Complete by: As directed    Discharge instructions   Complete by: As directed    Follow-up with primary care in 1 week for blood pressure management Follow-up with cardiology in 1 to 2 weeks Dr. Veatrice Bourbon   Increase activity slowly   Complete by: As directed      Allergies as of 01/21/2019      Reactions   Sulfa Antibiotics       Medication List    STOP taking these medications   lisinopril 20 MG tablet Commonly known as: ZESTRIL     TAKE these medications   amLODipine 5 MG tablet Commonly known as: NORVASC Take 1 tablet (5 mg total) by mouth daily. Start taking on: January 22, 2019   aspirin 81 MG EC tablet Take 1 tablet (81 mg total) by mouth daily. Start taking on: January 22, 2019 What changed:   medication strength  how much to take   clopidogrel 75 MG tablet Commonly known as: PLAVIX Take 1 tablet (75 mg total) by mouth daily. Start taking on: January 22, 2019   donepezil 10 MG tablet Commonly known as: Aricept Take 1 tablet (10 mg total) by mouth at bedtime.   ezetimibe 10 MG tablet Commonly known as: Zetia Take 1 tablet (10 mg total) by mouth  daily.   levothyroxine 75 MCG tablet Commonly known as: Synthroid 1/2 tablet q day What changed:   how much to take  how to take this  when to take this   metoprolol succinate 25 MG 24 hr tablet Commonly known as: TOPROL-XL Take 1 tablet (25 mg total) by mouth daily. What changed: See the new instructions.      Follow-up Information    Amanda Blinks, MD. Schedule an appointment as soon as  possible for a visit in 1 week(s).   Specialty: Cardiology Contact information: [address] [place] [facility] [place] [address] [phone]        Amanda Maytown, MD Follow up in 1 week(s).   Specialty: Internal Medicine Contact information: [address] [place] [address] [phone]          Allergies  Allergen Reactions  . Sulfa Antibiotics     Consultations: Cardiology  Procedures/Studies: CT Angio Chest PE W and/or Wo Contrast  Result Date: 01/19/2019 CLINICAL DATA:  Code STEMI, chest pain and shortness of breath EXAM: CT ANGIOGRAPHY CHEST WITH CONTRAST TECHNIQUE: Multidetector CT imaging of the chest was performed using the standard protocol during bolus administration of intravenous contrast. Multiplanar CT image reconstructions and MIPs were obtained to evaluate the vascular anatomy. CONTRAST:  75mL OMNIPAQUE IOHEXOL 350 MG/ML SOLN COMPARISON:  None. FINDINGS: Cardiovascular: There is a optimal opacification of the pulmonary arteries. There is no central,segmental, or subsegmental filling defects within the pulmonary arteries. There is moderate cardiomegaly. No evidence of right ventricular heart strain. Mild coronary artery and aortic atherosclerosis is seen. There is normal three-vessel brachiocephalic anatomy without proximal stenosis. The thoracic aorta is normal in appearance. Mediastinum/Nodes: No hilar, mediastinal, or axillary adenopathy. Thyroid gland, trachea, and esophagus demonstrate no significant findings. Lungs/Pleura: Mild streaky opacity seen at both lung bases. Small area of patchy airspace opacity in the left lung base. No pleural effusion or pneumothorax. Upper Abdomen: A small hiatal hernia present. No acute abnormalities present in the visualized portions of the upper abdomen. Musculoskeletal: No chest wall abnormality. No acute or significant osseous findings. Anterior flowing osteophytes seen  within the midthoracic spine. Review of the MIP images confirms the above findings. IMPRESSION: No central, segmental, or subsegmental pulmonary embolism. Streaky/patchy airspace opacity at both lung bases which could be due to atelectasis and/or early infectious etiology. Moderate cardiomegaly. Electronically Signed   By: Jonna Clark M.D.   On: 01/19/2019 23:30   DG Chest Portable 1 View  Result Date: 01/19/2019 CLINICAL DATA:  Chest pain and shortness of breath. Code STEMI. EXAM: PORTABLE CHEST 1 VIEW COMPARISON:  Radiographs 10/27/2017 and 11/04/2011. FINDINGS: 2006 hours. Lordotic positioning. The heart size and mediastinal contours are stable. There is mild aortic atherosclerosis. The lungs are clear. There is no pleural effusion or pneumothorax. Telemetry leads and external pacing pads are in place. IMPRESSION: No active cardiopulmonary process. Electronically Signed   By: Carey Bullocks M.D.   On: 01/19/2019 20:20   ECHOCARDIOGRAM COMPLETE  Result Date: 01/21/2019   ECHOCARDIOGRAM REPORT   Patient Name:   NIKEA SETTLE Date of Exam: 01/20/2019 Medical Rec #:  [id number]     Height:       64.0 in Accession #:    [id number]    Weight:       161.8 lb Date of Birth:  [birth year]     BSA:          1.79 m Patient Age:    85  years      BP:           172/76 mmHg Patient Gender: F             HR:           80 bpm. Exam Location:  [facility] Procedure: 2D Echo, Cardiac Doppler and Color Doppler Indications:     Chest pain 786.50  History:         Patient has no prior history of Echocardiogram examinations.                  Risk Factors:Hypertension. Dementia.  Sonographer:     Cristela BlueJerry Hege RDCS (AE) Referring Phys:  [id number] ANASTASSIA DOUTOVA Diagnosing Phys: Alwyn Peawayne D Callwood MD  Sonographer Comments: Suboptimal apical window. IMPRESSIONS  1. Left ventricular ejection fraction, by visual estimation, is 50 to 55%. The left ventricle has normal function. Mildly increased left ventricular posterior wall thickness. There  is borderline left ventricular hypertrophy.  2. The left ventricle has no regional wall motion abnormalities.  3. Global right ventricle has normal systolic function.The right ventricular size is normal. No increase in right ventricular wall thickness.  4. Left atrial size was normal.  5. Right atrial size was normal.  6. The mitral valve is normal in structure. Trivial mitral valve regurgitation.  7. The tricuspid valve is normal in structure.  8. The aortic valve is normal in structure. Aortic valve regurgitation is not visualized.  9. The pulmonic valve was grossly normal. Pulmonic valve regurgitation is not visualized. 10. Normal pulmonary artery systolic pressure. FINDINGS  Left Ventricle: Left ventricular ejection fraction, by visual estimation, is 50 to 55%. The left ventricle has normal function. The left ventricle has no regional wall motion abnormalities. Mildly increased left ventricular posterior wall thickness. There is borderline left ventricular hypertrophy. Right Ventricle: The right ventricular size is normal. No increase in right ventricular wall thickness. Global RV systolic function is has normal systolic function. The tricuspid regurgitant velocity is 2.13 m/s, and with an assumed right atrial pressure  of 10 mmHg, the estimated right ventricular systolic pressure is normal at 28.1 mmHg. Left Atrium: Left atrial size was normal in size. Right Atrium: Right atrial size was normal in size Pericardium: There is no evidence of pericardial effusion. Mitral Valve: The mitral valve is normal in structure. Trivial mitral valve regurgitation. Tricuspid Valve: The tricuspid valve is normal in structure. Tricuspid valve regurgitation is mild. Aortic Valve: The aortic valve is normal in structure. Aortic valve regurgitation is not visualized. Aortic valve mean gradient measures 2.0 mmHg. Aortic valve peak gradient measures 4.5 mmHg. Aortic valve area, by VTI measures 2.62 cm. Pulmonic Valve: The pulmonic  valve was grossly normal. Pulmonic valve regurgitation is not visualized. Pulmonic regurgitation is not visualized. Aorta: The aortic root is normal in size and structure. IAS/Shunts: No atrial level shunt detected by color flow Doppler.  LEFT VENTRICLE PLAX 2D LVIDd:         3.78 cm  Diastology LVIDs:         2.76 cm  LV e' lateral:   4.46 cm/s LV PW:         1.27 cm  LV E/e' lateral: 10.2 LV IVS:        0.94 cm  LV e' medial:    4.79 cm/s LVOT diam:     2.00 cm  LV E/e' medial:  9.5 LV SV:         33 ml LV SV Index:  17.75 LVOT Area:     3.14 cm  RIGHT VENTRICLE RV Basal diam:  2.40 cm RV S prime:     21.40 cm/s TAPSE (M-mode): 3.5 cm LEFT ATRIUM             Index       RIGHT ATRIUM           Index LA diam:        3.50 cm 1.96 cm/m  RA Area:     14.30 cm LA Vol (A2C):   53.9 ml 30.15 ml/m RA Volume:   29.90 ml  16.72 ml/m LA Vol (A4C):   31.1 ml 17.39 ml/m LA Biplane Vol: 43.6 ml 24.39 ml/m  AORTIC VALVE                   PULMONIC VALVE AV Area (Vmax):    2.53 cm    PV Vmax:        0.76 m/s AV Area (Vmean):   2.36 cm    PV Peak grad:   2.3 mmHg AV Area (VTI):     2.62 cm    RVOT Peak grad: 3 mmHg AV Vmax:           106.50 cm/s AV Vmean:          67.800 cm/s AV VTI:            0.188 m AV Peak Grad:      4.5 mmHg AV Mean Grad:      2.0 mmHg LVOT Vmax:         85.70 cm/s LVOT Vmean:        50.900 cm/s LVOT VTI:          0.157 m LVOT/AV VTI ratio: 0.84  AORTA Ao Root diam: 2.70 cm MITRAL VALVE                         TRICUSPID VALVE MV Area (PHT): 2.34 cm              TR Peak grad:   18.1 mmHg MV PHT:        93.96 msec            TR Vmax:        213.00 cm/s MV Decel Time: 324 msec MV E velocity: 45.60 cm/s  103 cm/s  SHUNTS MV A velocity: 108.00 cm/s 70.3 cm/s Systemic VTI:  0.16 m MV E/A ratio:  0.42        1.5       Systemic Diam: 2.00 cm  Dwayne Prince Rome MD Electronically signed by Yolonda Kida MD Signature Date/Time: 01/21/2019/10:13:29 AM    Final        Subjective:  Patient seen and  examined this AM.  Eating breakfast.  Denies any chest pain or shortness of breath.  At baseline dementia. Discharge Exam: Vitals:   01/21/19 0740 01/21/19 1209  BP: (!) 163/72 (!) 148/78  Pulse: 90 86  Resp: 19   Temp: 97.8 F (36.6 C)   SpO2: 96% 97%   Vitals:   01/20/19 1914 01/21/19 0325 01/21/19 0740 01/21/19 1209  BP: (!) 143/90 (!) 154/67 (!) 163/72 (!) 148/78  Pulse: 98 82 90 86  Resp: 18 20 19    Temp: 98.5 F (36.9 C) 98 F (36.7 C) 97.8 F (36.6 C)   TempSrc: Oral Oral Oral   SpO2: 95% 97% 96% 97%  Weight:  73.4 kg  Height:        General: Pt is alert, awake, not in acute distress Cardiovascular: RRR, S1/S2 +, no rubs, no gallops Respiratory: CTA bilaterally, no wheezing, no rhonchi Abdominal: Soft, NT, ND, bowel sounds + Extremities: no edema, no cyanosis    The results of significant diagnostics from this hospitalization (including imaging, microbiology, ancillary and laboratory) are listed below for reference.     Microbiology: Recent Results (from the past 240 hour(s))  Respiratory Panel by RT PCR (Flu A&B, Covid) - Nasopharyngeal Swab     Status: None   Collection Time: 01/19/19  7:42 PM   Specimen: Nasopharyngeal Swab  Result Value Ref Range Status   SARS Coronavirus 2 by RT PCR NEGATIVE NEGATIVE Final    Comment: (NOTE) SARS-CoV-2 target nucleic acids are NOT DETECTED. The SARS-CoV-2 RNA is generally detectable in upper respiratoy specimens during the acute phase of infection. The lowest concentration of SARS-CoV-2 viral copies this assay can detect is 131 copies/mL. A negative result does not preclude SARS-Cov-2 infection and should not be used as the sole basis for treatment or other patient management decisions. A negative result may occur with  improper specimen collection/handling, submission of specimen other than nasopharyngeal swab, presence of viral mutation(s) within the areas targeted by this assay, and inadequate number of viral  copies (<131 copies/mL). A negative result must be combined with clinical observations, patient history, and epidemiological information. The expected result is Negative. Fact Sheet for Patients:  https://www.moore.com/ Fact Sheet for Healthcare Providers:  https://www.young.biz/ This test is not yet ap proved or cleared by the [place] FDA and  has been authorized for detection and/or diagnosis of SARS-CoV-2 by FDA under an Emergency Use Authorization (EUA). This EUA will remain  in effect (meaning this test can be used) for the duration of the COVID-19 declaration under Section 564(b)(1) of the Act, 21 U.S.C. section 360bbb-3(b)(1), unless the authorization is terminated or revoked sooner.    Influenza A by PCR NEGATIVE NEGATIVE Final   Influenza B by PCR NEGATIVE NEGATIVE Final    Comment: (NOTE) The Xpert Xpress SARS-CoV-2/FLU/RSV assay is intended as an aid in  the diagnosis of influenza from Nasopharyngeal swab specimens and  should not be used as a sole basis for treatment. Nasal washings and  aspirates are unacceptable for Xpert Xpress SARS-CoV-2/FLU/RSV  testing. Fact Sheet for Patients: https://www.moore.com/ Fact Sheet for Healthcare Providers: https://www.young.biz/ This test is not yet approved or cleared by the [place] FDA and  has been authorized for detection and/or diagnosis of SARS-CoV-2 by  FDA under an Emergency Use Authorization (EUA). This EUA will remain  in effect (meaning this test can be used) for the duration of the  Covid-19 declaration under Section 564(b)(1) of the Act, 21  U.S.C. section 360bbb-3(b)(1), unless the authorization is  terminated or revoked. Performed at [facility], [address] [place], [place] [address]      Labs: BNP (last 3 results) Recent Labs    01/20/19 0108  BNP 76.0   Basic Metabolic Panel: Recent Labs  Lab  01/19/19 1944 01/20/19 0435 01/21/19 0309  NA 139 142 140  K 5.1 5.3* 3.9  CL 104 107 103  CO2 GLUCOSE 139* 122* 132*  BUN CREATININE 1.01* 0.95 1.03*  CALCIUM 9.8 9.4 9.2   Liver Function Tests: Recent Labs  Lab 01/19/19 1944  AST 23  ALT 12  ALKPHOS 75  BILITOT 0.8  PROT 7.3  ALBUMIN 3.7   Recent Labs  Lab 01/19/19 1944  LIPASE 29   No results for input(s): AMMONIA in the last 168 hours. CBC: Recent Labs  Lab 01/19/19 1944 01/21/19 0309  WBC 11.7* 10.9*  NEUTROABS 8.4*  --   HGB 13.8 14.0  HCT 42.2 42.5  MCV 86.7 84.8  PLT 323 290   Cardiac Enzymes: No results for input(s): CKTOTAL, CKMB, CKMBINDEX, TROPONINI in the last 168 hours. BNP: Invalid input(s): POCBNP CBG: Recent Labs  Lab 01/20/19 1151 01/20/19 1704 01/20/19 2110 01/21/19 0750 01/21/19 1116  GLUCAP 122* 137* 148* 123* 113*   D-Dimer No results for input(s): DDIMER in the last 72 hours. Hgb A1c Recent Labs    01/20/19 0435  HGBA1C 6.5*   Lipid Profile Recent Labs    01/19/19 1944  CHOL 232*  HDL 39*  LDLCALC 155*  TRIG 189*  CHOLHDL 5.9   Thyroid function studies No results for input(s): TSH, T4TOTAL, T3FREE, THYROIDAB in the last 72 hours.  Invalid input(s): FREET3 Anemia work up No results for input(s): VITAMINB12, FOLATE, FERRITIN, TIBC, IRON, RETICCTPCT in the last 72 hours. Urinalysis    Component Value Date/Time   COLORURINE YELLOW (A) 10/27/2017 1348   APPEARANCEUR HAZY (A) 10/27/2017 1348   APPEARANCEUR Hazy 11/04/2011 0934   LABSPEC 1.015 10/27/2017 1348   LABSPEC 1.010 11/04/2011 0934   PHURINE 8.0 10/27/2017 1348   GLUCOSEU NEGATIVE 10/27/2017 1348   GLUCOSEU Negative 11/04/2011 0934   HGBUR NEGATIVE 10/27/2017 1348   BILIRUBINUR NEGATIVE 10/27/2017 1348   BILIRUBINUR neg 11/11/2011 1150   BILIRUBINUR Negative 11/04/2011 0934   KETONESUR NEGATIVE 10/27/2017 1348   PROTEINUR NEGATIVE 10/27/2017 1348   UROBILINOGEN 0.2 11/11/2011  1150   NITRITE NEGATIVE 10/27/2017 1348   LEUKOCYTESUR NEGATIVE 10/27/2017 1348   LEUKOCYTESUR Negative 11/04/2011 0934   Sepsis Labs Invalid input(s): PROCALCITONIN,  WBC,  LACTICIDVEN Microbiology Recent Results (from the past 240 hour(s))  Respiratory Panel by RT PCR (Flu A&B, Covid) - Nasopharyngeal Swab     Status: None   Collection Time: 01/19/19  7:42 PM   Specimen: Nasopharyngeal Swab  Result Value Ref Range Status   SARS Coronavirus 2 by RT PCR NEGATIVE NEGATIVE Final    Comment: (NOTE) SARS-CoV-2 target nucleic acids are NOT DETECTED. The SARS-CoV-2 RNA is generally detectable in upper respiratoy specimens during the acute phase of infection. The lowest concentration of SARS-CoV-2 viral copies this assay can detect is 131 copies/mL. A negative result does not preclude SARS-Cov-2 infection and should not be used as the sole basis for treatment or other patient management decisions. A negative result may occur with  improper specimen collection/handling, submission of specimen other than nasopharyngeal swab, presence of viral mutation(s) within the areas targeted by this assay, and inadequate number of viral copies (<131 copies/mL). A negative result must be combined with clinical observations, patient history, and epidemiological information. The expected result is Negative. Fact Sheet for Patients:  https://www.moore.com/ Fact Sheet for Healthcare Providers:  https://www.young.biz/ This test is not yet ap proved or cleared by the [place] FDA and  has been authorized for detection and/or diagnosis of SARS-CoV-2 by FDA under an Emergency Use Authorization (EUA). This EUA will remain  in effect (meaning this test can be used) for the duration of the COVID-19 declaration under Section 564(b)(1) of the Act, 21 U.S.C. section 360bbb-3(b)(1), unless the authorization is terminated or revoked sooner.    Influenza A by PCR NEGATIVE  NEGATIVE Final   Influenza B by  PCR NEGATIVE NEGATIVE Final    Comment: (NOTE) The Xpert Xpress SARS-CoV-2/FLU/RSV assay is intended as an aid in  the diagnosis of influenza from Nasopharyngeal swab specimens and  should not be used as a sole basis for treatment. Nasal washings and  aspirates are unacceptable for Xpert Xpress SARS-CoV-2/FLU/RSV  testing. Fact Sheet for Patients: https://www.moore.com/ Fact Sheet for Healthcare Providers: https://www.young.biz/ This test is not yet approved or cleared by the [place] FDA and  has been authorized for detection and/or diagnosis of SARS-CoV-2 by  FDA under an Emergency Use Authorization (EUA). This EUA will remain  in effect (meaning this test can be used) for the duration of the  Covid-19 declaration under Section 564(b)(1) of the Act, 21  U.S.C. section 360bbb-3(b)(1), unless the authorization is  terminated or revoked. Performed at [facility], [address] [place], [place] [address]      Time coordinating discharge: Over 30 minutes  SIGNED:   Lynn Ito, MD  [facility] 01/21/2019, 2:19 PM Pager   If 7PM-7AM, please contact night-coverage [email or web address] Password [email or web address]

## 2019-01-22 ENCOUNTER — Inpatient Hospital Stay: Payer: Medicare Other

## 2019-01-22 DIAGNOSIS — E039 Hypothyroidism, unspecified: Secondary | ICD-10-CM

## 2019-01-22 DIAGNOSIS — I2119 ST elevation (STEMI) myocardial infarction involving other coronary artery of inferior wall: Secondary | ICD-10-CM

## 2019-01-22 DIAGNOSIS — I2111 ST elevation (STEMI) myocardial infarction involving right coronary artery: Principal | ICD-10-CM

## 2019-01-22 DIAGNOSIS — F0390 Unspecified dementia without behavioral disturbance: Secondary | ICD-10-CM

## 2019-01-22 LAB — BASIC METABOLIC PANEL
Anion gap: 10 (ref 5–15)
BUN: 24 mg/dL — ABNORMAL HIGH (ref 8–23)
CO2: 24 mmol/L (ref 22–32)
Calcium: 8.4 mg/dL — ABNORMAL LOW (ref 8.9–10.3)
Chloride: 105 mmol/L (ref 98–111)
Creatinine, Ser: 1.11 mg/dL — ABNORMAL HIGH (ref 0.44–1.00)
GFR calc Af Amer: 53 mL/min — ABNORMAL LOW (ref >60)
GFR calc non Af Amer: 46 mL/min — ABNORMAL LOW (ref >60)
Glucose, Bld: 107 mg/dL — ABNORMAL HIGH (ref 70–99)
Potassium: 4.2 mmol/L (ref 3.5–5.1)
Sodium: 139 mmol/L (ref 135–145)

## 2019-01-22 LAB — TROPONIN I (HIGH SENSITIVITY)
Troponin I (High Sensitivity): 10437 ng/L (ref <18)
Troponin I (High Sensitivity): 8822 ng/L (ref <18)
Troponin I (High Sensitivity): 9455 ng/L (ref <18)

## 2019-01-22 LAB — CBC WITH DIFFERENTIAL/PLATELET
Abs Immature Granulocytes: 0.03 10*3/uL (ref 0.00–0.07)
Basophils Absolute: 0.1 10*3/uL (ref 0.0–0.1)
Basophils Relative: 1 %
Eosinophils Absolute: 0.2 10*3/uL (ref 0.0–0.5)
Eosinophils Relative: 1 %
HCT: 36.8 % (ref 36.0–46.0)
Hemoglobin: 11.8 g/dL — ABNORMAL LOW (ref 12.0–15.0)
Immature Granulocytes: 0 %
Lymphocytes Relative: 11 %
Lymphs Abs: 1.3 10*3/uL (ref 0.7–4.0)
MCH: 27.6 pg (ref 26.0–34.0)
MCHC: 32.1 g/dL (ref 30.0–36.0)
MCV: 86.2 fL (ref 80.0–100.0)
Monocytes Absolute: 1.5 10*3/uL — ABNORMAL HIGH (ref 0.1–1.0)
Monocytes Relative: 12 %
Neutro Abs: 9.4 10*3/uL — ABNORMAL HIGH (ref 1.7–7.7)
Neutrophils Relative %: 75 %
Platelets: 264 10*3/uL (ref 150–400)
RBC: 4.27 MIL/uL (ref 3.87–5.11)
RDW: 14.2 % (ref 11.5–15.5)
WBC: 12.4 10*3/uL — ABNORMAL HIGH (ref 4.0–10.5)
nRBC: 0 % (ref 0.0–0.2)

## 2019-01-22 LAB — MAGNESIUM: Magnesium: 2 mg/dL (ref 1.7–2.4)

## 2019-01-22 LAB — PHOSPHORUS: Phosphorus: 4.2 mg/dL (ref 2.5–4.6)

## 2019-01-22 MED ORDER — LACTATED RINGERS IV BOLUS
500.0000 mL | Freq: Once | INTRAVENOUS | Status: AC
  Administered 2019-01-22: 500 mL via INTRAVENOUS

## 2019-01-22 MED ORDER — CHLORHEXIDINE GLUCONATE CLOTH 2 % EX PADS
6.0000 | MEDICATED_PAD | Freq: Every day | CUTANEOUS | Status: DC
  Administered 2019-01-22 – 2019-01-24 (×3): 6 via TOPICAL

## 2019-01-22 NOTE — Progress Notes (Signed)
PROGRESS NOTE    Amanda [name]  JSH:[id number] DOB: Jun 08, 1933 DOA: 01/21/2019 PCP: Einar Pheasant, MD    Brief Narrative:  This is an 85 yo female with a PMH of Hypothyroidism, HTN, Hypercholesterolemia, Dementia, and Bilateral Carotid Bruit.  She presented to [facility] ER on 12/25 with significant diaphoresis.  She was discharged during the earlier part of the day on 01/21/19 after being medically managed for unstable angina by cardiology with outpt f/u.  Patient was discharged home and within a short while returned as family described patient becoming diaphoretic and had shakes with EKG done by EMS revealing ST elevation inferiorly with reciprocal depressions laterally.  Patient was admitted with code STEMI.  Patient was sent to cardiac cath immediately, transferred to ICU for observation under critical care team.     Consultants:   Cardiology  Procedures:  Cardiac catheterization 01/21/2019  Prox RCA lesion is 95% stenosed.  Ost LAD lesion is 25% stenosed.  A drug-eluting stent was successfully placed using a STENT RESOLUTE ONYX 3.0X22.  Post intervention, there is a 0% residual stenosis.  Mild reduced left ventricular function global ejection fraction around 45%  Insignificant disease and LAD left main circumflex  Conclusion Mild reduced overall left ventricular function around 45% globally Insignificant disease in the left main LAD and circumflex Successful PCI and stent of mid RCA lesion 95% reduced to 0% after deployment and placement of a DES 3.0 x 22 mm stent Failed minx deployment Placement of FemoStop overnight Patient to be maintained on aspirin Plavix Statin was restarted    CT abd 1. No retroperitoneal hematoma. There is some mild hemorrhage in the right groin, compatible with vascular access for yesterday's cardiac procedure. No focal hematoma in the right groin or extension of hemorrhage up into the right pelvic sidewall. 2. 2.3 cm interpolar right renal  lesion with attenuation slightly higher than would be expected for a simple cyst. This is likely a cyst complicated by proteinaceous debris or hemorrhage. MRI could provide more definitive characterization as clinically warranted. Follow-up ultrasound in 3 months could be used to ensure stability. 3. Small hiatal hernia. 4.  Aortic Atherosclerois (ICD10-170.0)   Antimicrobials:   None   Subjective: Patient laying in bed in the ICU.  Denies chest pain or shortness of breath.  Denies any other symptoms.  Appears sleepy.  Objective: Vitals:   01/22/19 0945 01/22/19 1000 01/22/19 1100 01/22/19 1200  BP: 107/72 112/63 (!) 123/57 (!) 118/53  Pulse:  79 85 77  Resp:  16 (!) 22 16  Temp:    97.9 F (36.6 C)  TempSrc:    Oral  SpO2:  97% 96% 96%  Height:        Intake/Output Summary (Last 24 hours) at 01/22/2019 1449 Last data filed at 01/22/2019 1031 Gross per 24 hour  Intake 527.1 ml  Output 651 ml  Net -123.9 ml   There were no vitals filed for this visit.  Examination:  General exam: Appears calm and comfortable, sleepy this a.m. Respiratory system: Clear to auscultation. Respiratory effort normal. Cardiovascular system: S1 & S2 heard, RRR. No JVD, murmurs, rubs, gallops or clicks. No pedal edema. Gastrointestinal system: Abdomen is nondistended, soft and nontender.  Normal bowel sounds heard. Central nervous system: Alert and oriented. No focal neurological deficits. Extremities: No cyanosis. Skin: Warm dry Psychiatry:  Mood & affect appropriate in the setting.     Data Reviewed: I have personally reviewed following labs and imaging studies  CBC: Recent Labs  Lab  01/19/19 1944 01/21/19 0309 01/21/19 1726 01/22/19 0548  WBC 11.7* 10.9* 14.9* 12.4*  NEUTROABS 8.4*  --  12.1* 9.4*  HGB 13.8 14.0 15.1* 11.8*  HCT 42.2 42.5 44.8 36.8  MCV 86.7 84.8 81.9 86.2  PLT 323 290 293 264   Basic Metabolic Panel: Recent Labs  Lab 01/19/19 1944 01/20/19 0435  01/21/19 0309 01/21/19 1726 01/22/19 0548  NA 139 142 140 137 139  K 5.1 5.3* 3.9 3.9 4.2  CL 104 107 103 101 105  CO2 26 27 26 22 24   GLUCOSE 139* 122* 132* 195* 107*  BUN 19 17 17 23  24*  CREATININE 1.01* 0.95 1.03* 1.18* 1.11*  CALCIUM 9.8 9.4 9.2 9.3 8.4*  MG  --   --   --   --  2.0  PHOS  --   --   --   --  4.2   GFR: Estimated Creatinine Clearance: 37.7 mL/min (A) (by C-G formula based on SCr of 1.11 mg/dL (H)). Liver Function Tests: Recent Labs  Lab 01/19/19 1944 01/21/19 1726  AST 23 43*  ALT 12 16  ALKPHOS 75 77  BILITOT 0.8 0.8  PROT 7.3 7.5  ALBUMIN 3.7 3.7   Recent Labs  Lab 01/19/19 1944  LIPASE 29   No results for input(s): AMMONIA in the last 168 hours. Coagulation Profile: Recent Labs  Lab 01/19/19 1944 01/21/19 1726  INR 0.9 1.0   Cardiac Enzymes: No results for input(s): CKTOTAL, CKMB, CKMBINDEX, TROPONINI in the last 168 hours. BNP (last 3 results) No results for input(s): PROBNP in the last 8760 hours. HbA1C: Recent Labs    01/20/19 0435  HGBA1C 6.5*   CBG: Recent Labs  Lab 01/20/19 1151 01/20/19 1704 01/20/19 2110 01/21/19 0750 01/21/19 1116  GLUCAP 122* 137* 148* 123* 113*   Lipid Profile: Recent Labs    01/19/19 1944  CHOL 232*  HDL 39*  LDLCALC 155*  TRIG 189*  CHOLHDL 5.9   Thyroid Function Tests: No results for input(s): TSH, T4TOTAL, FREET4, T3FREE, THYROIDAB in the last 72 hours. Anemia Panel: No results for input(s): VITAMINB12, FOLATE, FERRITIN, TIBC, IRON, RETICCTPCT in the last 72 hours. Sepsis Labs: No results for input(s): PROCALCITON, LATICACIDVEN in the last 168 hours.  Recent Results (from the past 240 hour(s))  Respiratory Panel by RT PCR (Flu A&B, Covid) - Nasopharyngeal Swab     Status: None   Collection Time: 01/19/19  7:42 PM   Specimen: Nasopharyngeal Swab  Result Value Ref Range Status   SARS Coronavirus 2 by RT PCR NEGATIVE NEGATIVE Final    Comment: (NOTE) SARS-CoV-2 target nucleic  acids are NOT DETECTED. The SARS-CoV-2 RNA is generally detectable in upper respiratoy specimens during the acute phase of infection. The lowest concentration of SARS-CoV-2 viral copies this assay can detect is 131 copies/mL. A negative result does not preclude SARS-Cov-2 infection and should not be used as the sole basis for treatment or other patient management decisions. A negative result may occur with  improper specimen collection/handling, submission of specimen other than nasopharyngeal swab, presence of viral mutation(s) within the areas targeted by this assay, and inadequate number of viral copies (<131 copies/mL). A negative result must be combined with clinical observations, patient history, and epidemiological information. The expected result is Negative. Fact Sheet for Patients:  https://www.moore.com/https://www.fda.gov/media/142436/download Fact Sheet for Healthcare Providers:  https://www.young.biz/https://www.fda.gov/media/142435/download This test is not yet ap proved or cleared by the [place] FDA and  has been authorized for detection and/or diagnosis of SARS-CoV-2  by FDA under an Emergency Use Authorization (EUA). This EUA will remain  in effect (meaning this test can be used) for the duration of the COVID-19 declaration under Section 564(b)(1) of the Act, 21 U.S.C. section 360bbb-3(b)(1), unless the authorization is terminated or revoked sooner.    Influenza A by PCR NEGATIVE NEGATIVE Final   Influenza B by PCR NEGATIVE NEGATIVE Final    Comment: (NOTE) The Xpert Xpress SARS-CoV-2/FLU/RSV assay is intended as an aid in  the diagnosis of influenza from Nasopharyngeal swab specimens and  should not be used as a sole basis for treatment. Nasal washings and  aspirates are unacceptable for Xpert Xpress SARS-CoV-2/FLU/RSV  testing. Fact Sheet for Patients: https://www.moore.com/ Fact Sheet for Healthcare Providers: https://www.young.biz/ This test is not yet  approved or cleared by the [place] FDA and  has been authorized for detection and/or diagnosis of SARS-CoV-2 by  FDA under an Emergency Use Authorization (EUA). This EUA will remain  in effect (meaning this test can be used) for the duration of the  Covid-19 declaration under Section 564(b)(1) of the Act, 21  U.S.C. section 360bbb-3(b)(1), unless the authorization is  terminated or revoked. Performed at [facility], [address] [place], [place] [address]   MRSA PCR Screening     Status: None   Collection Time: 01/21/19  7:45 PM   Specimen: Nasopharyngeal  Result Value Ref Range Status   MRSA by PCR NEGATIVE NEGATIVE Final    Comment:        The GeneXpert MRSA Assay (FDA approved for NASAL specimens only), is one component of a comprehensive MRSA colonization surveillance program. It is not intended to diagnose MRSA infection nor to guide or monitor treatment for MRSA infections. Performed at [facility], [address] [place], [place] [address]          Radiology Studies: CT ABDOMEN PELVIS WO CONTRAST  Result Date: 01/22/2019 CLINICAL DATA:  Hypotension with decreasing hemoglobin. Evaluate for retroperitoneal hematoma. EXAM: CT ABDOMEN AND PELVIS WITHOUT CONTRAST TECHNIQUE: Multidetector CT imaging of the abdomen and pelvis was performed following the standard protocol without IV contrast. COMPARISON:  None. FINDINGS: Lower chest: Minimal basilar atelectasis. Coronary artery calcification is evident. Hepatobiliary: No focal abnormality in the liver on this study without intravenous contrast. Gallbladder is surgically absent. No intrahepatic or extrahepatic biliary dilation. Pancreas: No focal mass lesion. No dilatation of the main duct. No intraparenchymal cyst. No peripancreatic edema. Spleen: No splenomegaly. No focal mass lesion. Adrenals/Urinary Tract: No adrenal nodule or mass. 2.3 cm lesion posterior interpolar right kidney has attenuation  slightly higher than would be expected for a simple cyst. Left kidney unremarkable. Both kidneys excreting contrast, consistent with cardiac catheterization yesterday. No evidence for hydroureter. Urinary bladder decompressed by a Foley catheter. Stomach/Bowel: Small hiatal hernia. Stomach otherwise unremarkable. Duodenum is normally positioned as is the ligament of Treitz. No small bowel wall thickening. No small bowel dilatation. The terminal ileum is normal. The appendix is normal. No gross colonic mass. No colonic wall thickening. Diverticular changes are noted in the left colon without evidence of diverticulitis. Vascular/Lymphatic: There is abdominal aortic atherosclerosis without aneurysm. There is no gastrohepatic or hepatoduodenal ligament lymphadenopathy. No retroperitoneal or mesenteric lymphadenopathy. No pelvic sidewall lymphadenopathy. Reproductive: The uterus is unremarkable.  There is no adnexal mass. Other: No intraperitoneal free fluid. Musculoskeletal: There is some hemorrhage in the right groin region compatible with recent cardiac catheterization. No focal hematoma in the right groin. There is no extension of hemorrhage  or evidence of hematoma into the right pelvic sidewall or retroperitoneal tissues of the abdomen/pelvis. IMPRESSION: 1. No retroperitoneal hematoma. There is some mild hemorrhage in the right groin, compatible with vascular access for yesterday's cardiac procedure. No focal hematoma in the right groin or extension of hemorrhage up into the right pelvic sidewall. 2. 2.3 cm interpolar right renal lesion with attenuation slightly higher than would be expected for a simple cyst. This is likely a cyst complicated by proteinaceous debris or hemorrhage. MRI could provide more definitive characterization as clinically warranted. Follow-up ultrasound in 3 months could be used to ensure stability. 3. Small hiatal hernia. 4.  Aortic Atherosclerois (ICD10-170.0) Electronically Signed   By:  Kennith Center M.D.   On: 01/22/2019 13:31   CARDIAC CATHETERIZATION  Result Date: 01/21/2019  Prox RCA lesion is 95% stenosed.  Ost LAD lesion is 25% stenosed.  A drug-eluting stent was successfully placed using a STENT RESOLUTE ONYX 3.0X22.  Post intervention, there is a 0% residual stenosis.  Mild reduced left ventricular function global ejection fraction around 45%  Insignificant disease and LAD left main circumflex  Conclusion Mild reduced overall left ventricular function around 45% globally Insignificant disease in the left main LAD and circumflex Successful PCI and stent of mid RCA lesion 95% reduced to 0% after deployment and placement of a DES 3.0 x 22 mm stent Failed minx deployment Placement of FemoStop overnight Patient to be maintained on aspirin Plavix Statin was restarted        Scheduled Meds: . amLODipine  5 mg Oral Daily  . aspirin  81 mg Oral Daily  . atorvastatin  80 mg Oral q1800  . Chlorhexidine Gluconate Cloth  6 each Topical Daily  . clopidogrel  75 mg Oral Q breakfast  . donepezil  10 mg Oral QHS  . ezetimibe  10 mg Oral Daily  . levothyroxine  75 mcg Oral Q0600  . lisinopril  20 mg Oral Daily  . metoprolol tartrate  25 mg Oral BID  . sodium chloride flush  3 mL Intravenous Q12H   Continuous Infusions: . sodium chloride      Assessment & Plan:   Active Problems:   STEMI involving right coronary artery (HCC)   Acute ST elevation myocardial infarction (STEMI) of inferior wall (HCC)  #1 STEMI EKG changes +elevated TP Secondary to 95% stenosis of proximal RCA Status post DES placement-continue on aspirin, Plavix, Lipitor Per cardiology discontinue amlodipine altogether  CT abd negative for retroperitoneal bleed, see above results Telemetry Monitor bp    #2 hypothyroidism Continue on Synthroid  #3 dementia Continue on donepezil  #4 urinary retention- foley was placed. Will continue and try voiding trial tomorrow.  #5  leukocytosis-afebrile. Likely reactive/stress induced from STEMI. monitor   DVT prophylaxis: SCD Code Status:full Family Communication: None at bedside Disposition Plan: Likely DC in couple of days once medically stable.  Monitor H&H.  Needs to ambulate.       LOS: 1 day   Time spent: 45 minutes with more than 50% COC    Lynn Ito, MD [facility] Pager [phone]  If 7PM-7AM, please contact night-coverage [email or web address] Password [facility] 01/22/2019, 2:49 PM

## 2019-01-22 NOTE — Progress Notes (Signed)
Pt asleep. SPO2@ dropped to 75%. Placed on 2L [place].

## 2019-01-22 NOTE — Progress Notes (Signed)
Pt confused. Oriented to self only. Requested to speak with her husband. PT seems nervous. Hands are shaking and HR of 101. RN called husband and husband spoke with PT. Husband agreed to come and visit as request by PT. Will continue to monitor.

## 2019-01-22 NOTE — Progress Notes (Signed)
Pt has had no output from [facility]wick this morning. Bladder scan complete, showing greater that 721 ML. See new orders

## 2019-01-22 NOTE — Progress Notes (Signed)
The patient is transferred to 2 A 252. Patient was A & O x 4. No  acute distress noted or any c/o  pain reported. Called and notified the spouse Mr. Angelisse Riso of her transfer. Husband was appreciative.

## 2019-01-22 NOTE — Progress Notes (Signed)
[facility]    SUBJECTIVE: Significant dementia so interview was difficult but currently patient denies any significant chest pain resting comfortably in the bed husband is at the bedside   Vitals:   01/22/19 0945 01/22/19 1000 01/22/19 1100 01/22/19 1200  BP: 107/72 112/63 (!) 123/57 (!) 118/53  Pulse:  79 85 77  Resp:  16 (!) 22 16  Temp:    97.9 F (36.6 C)  TempSrc:    Oral  SpO2:  97% 96% 96%  Height:         Intake/Output Summary (Last 24 hours) at 01/22/2019 1214 Last data filed at 01/22/2019 1031 Gross per 24 hour  Intake 527.1 ml  Output 651 ml  Net -123.9 ml      PHYSICAL EXAM  General: Well developed, well nourished, in no acute distress HEENT:  Normocephalic and atramatic Neck:  No JVD.  Lungs: Clear bilaterally to auscultation and percussion. Heart: HRRR . Normal S1 and S2 without gallops or murmurs.  Abdomen: Bowel sounds are positive, abdomen soft and non-tender  Msk:  Back normal, normal gait. Normal strength and tone for age. Extremities: No clubbing, cyanosis or edema.  Right groin stable with PD in place Neuro: Alert and oriented X 3. Psych:  Good affect, responds appropriately   LABS: Basic Metabolic Panel: Recent Labs    01/21/19 1726 01/22/19 0548  NA 137 139  K 3.9 4.2  CL 101 105  CO2 22 24  GLUCOSE 195* 107*  BUN 23 24*  CREATININE 1.18* 1.11*  CALCIUM 9.3 8.4*  MG  --  2.0  PHOS  --  4.2   Liver Function Tests: Recent Labs    01/19/19 1944 01/21/19 1726  AST 23 43*  ALT 12 16  ALKPHOS 75 77  BILITOT 0.8 0.8  PROT 7.3 7.5  ALBUMIN 3.7 3.7   Recent Labs    01/19/19 1944  LIPASE 29   CBC: Recent Labs    01/21/19 1726 01/22/19 0548  WBC 14.9* 12.4*  NEUTROABS 12.1* 9.4*  HGB 15.1* 11.8*  HCT 44.8 36.8  MCV 81.9 86.2  PLT 293 264   Cardiac Enzymes: No results for input(s): CKTOTAL, CKMB, CKMBINDEX, TROPONINI in the last 72 hours. BNP: Invalid input(s): POCBNP D-Dimer: No results for input(s): DDIMER  in the last 72 hours. Hemoglobin A1C: Recent Labs    01/20/19 0435  HGBA1C 6.5*   Fasting Lipid Panel: Recent Labs    01/19/19 1944  CHOL 232*  HDL 39*  LDLCALC 155*  TRIG 189*  CHOLHDL 5.9   Thyroid Function Tests: No results for input(s): TSH, T4TOTAL, T3FREE, THYROIDAB in the last 72 hours.  Invalid input(s): FREET3 Anemia Panel: No results for input(s): VITAMINB12, FOLATE, FERRITIN, TIBC, IRON, RETICCTPCT in the last 72 hours.  CARDIAC CATHETERIZATION  Result Date: 01/21/2019  Prox RCA lesion is 95% stenosed.  Ost LAD lesion is 25% stenosed.  A drug-eluting stent was successfully placed using a STENT RESOLUTE ONYX 3.0X22.  Post intervention, there is a 0% residual stenosis.  Mild reduced left ventricular function global ejection fraction around 45%  Insignificant disease and LAD left main circumflex  Conclusion Mild reduced overall left ventricular function around 45% globally Insignificant disease in the left main LAD and circumflex Successful PCI and stent of mid RCA lesion 95% reduced to 0% after deployment and placement of a DES 3.0 x 22 mm stent Failed minx deployment Placement of FemoStop overnight Patient to be maintained on aspirin Plavix Statin was restarted  ECHOCARDIOGRAM COMPLETE  Result Date: 01/21/2019   ECHOCARDIOGRAM REPORT   Patient Name:   Amanda [name] Date of Exam: 01/20/2019 Medical Rec #:  [id number]     Height:       64.0 in Accession #:    [id number]    Weight:       161.8 lb Date of Birth:  [birth year]     BSA:          1.79 m Patient Age:    85 years      BP:           172/76 mmHg Patient Gender: F             HR:           80 bpm. Exam Location:  [facility] Procedure: 2D Echo, Cardiac Doppler and Color Doppler Indications:     Chest pain 786.50  History:         Patient has no prior history of Echocardiogram examinations.                  Risk Factors:Hypertension. Dementia.  Sonographer:     Cristela Blue RDCS (AE) Referring Phys:  Kenn File DOUTOVA  Diagnosing Phys: Alwyn Pea MD  Sonographer Comments: Suboptimal apical window. IMPRESSIONS  1. Left ventricular ejection fraction, by visual estimation, is 50 to 55%. The left ventricle has normal function. Mildly increased left ventricular posterior wall thickness. There is borderline left ventricular hypertrophy.  2. The left ventricle has no regional wall motion abnormalities.  3. Global right ventricle has normal systolic function.The right ventricular size is normal. No increase in right ventricular wall thickness.  4. Left atrial size was normal.  5. Right atrial size was normal.  6. The mitral valve is normal in structure. Trivial mitral valve regurgitation.  7. The tricuspid valve is normal in structure.  8. The aortic valve is normal in structure. Aortic valve regurgitation is not visualized.  9. The pulmonic valve was grossly normal. Pulmonic valve regurgitation is not visualized. 10. Normal pulmonary artery systolic pressure. FINDINGS  Left Ventricle: Left ventricular ejection fraction, by visual estimation, is 50 to 55%. The left ventricle has normal function. The left ventricle has no regional wall motion abnormalities. Mildly increased left ventricular posterior wall thickness. There is borderline left ventricular hypertrophy. Right Ventricle: The right ventricular size is normal. No increase in right ventricular wall thickness. Global RV systolic function is has normal systolic function. The tricuspid regurgitant velocity is 2.13 m/s, and with an assumed right atrial pressure  of 10 mmHg, the estimated right ventricular systolic pressure is normal at 28.1 mmHg. Left Atrium: Left atrial size was normal in size. Right Atrium: Right atrial size was normal in size Pericardium: There is no evidence of pericardial effusion. Mitral Valve: The mitral valve is normal in structure. Trivial mitral valve regurgitation. Tricuspid Valve: The tricuspid valve is normal in structure. Tricuspid valve  regurgitation is mild. Aortic Valve: The aortic valve is normal in structure. Aortic valve regurgitation is not visualized. Aortic valve mean gradient measures 2.0 mmHg. Aortic valve peak gradient measures 4.5 mmHg. Aortic valve area, by VTI measures 2.62 cm. Pulmonic Valve: The pulmonic valve was grossly normal. Pulmonic valve regurgitation is not visualized. Pulmonic regurgitation is not visualized. Aorta: The aortic root is normal in size and structure. IAS/Shunts: No atrial level shunt detected by color flow Doppler.  LEFT VENTRICLE PLAX 2D LVIDd:         3.78 cm  Diastology  LVIDs:         2.76 cm  LV e' lateral:   4.46 cm/s LV PW:         1.27 cm  LV E/e' lateral: 10.2 LV IVS:        0.94 cm  LV e' medial:    4.79 cm/s LVOT diam:     2.00 cm  LV E/e' medial:  9.5 LV SV:         33 ml LV SV Index:   17.75 LVOT Area:     3.14 cm  RIGHT VENTRICLE RV Basal diam:  2.40 cm RV S prime:     21.40 cm/s TAPSE (M-mode): 3.5 cm LEFT ATRIUM             Index       RIGHT ATRIUM           Index LA diam:        3.50 cm 1.96 cm/m  RA Area:     14.30 cm LA Vol (A2C):   53.9 ml 30.15 ml/m RA Volume:   29.90 ml  16.72 ml/m LA Vol (A4C):   31.1 ml 17.39 ml/m LA Biplane Vol: 43.6 ml 24.39 ml/m  AORTIC VALVE                   PULMONIC VALVE AV Area (Vmax):    2.53 cm    PV Vmax:        0.76 m/s AV Area (Vmean):   2.36 cm    PV Peak grad:   2.3 mmHg AV Area (VTI):     2.62 cm    RVOT Peak grad: 3 mmHg AV Vmax:           106.50 cm/s AV Vmean:          67.800 cm/s AV VTI:            0.188 m AV Peak Grad:      4.5 mmHg AV Mean Grad:      2.0 mmHg LVOT Vmax:         85.70 cm/s LVOT Vmean:        50.900 cm/s LVOT VTI:          0.157 m LVOT/AV VTI ratio: 0.84  AORTA Ao Root diam: 2.70 cm MITRAL VALVE                         TRICUSPID VALVE MV Area (PHT): 2.34 cm              TR Peak grad:   18.1 mmHg MV PHT:        93.96 msec            TR Vmax:        213.00 cm/s MV Decel Time: 324 msec MV E velocity: 45.60 cm/s  103 cm/s   SHUNTS MV A velocity: 108.00 cm/s 70.3 cm/s Systemic VTI:  0.16 m MV E/A ratio:  0.42        1.5       Systemic Diam: 2.00 cm  Yolonda Kida MD Electronically signed by Yolonda Kida MD Signature Date/Time: 01/21/2019/10:13:29 AM    Final      Echo performed 12/25  TELEMETRY: Normal sinus rhythm rate of 80:  ASSESSMENT AND PLAN:  Active Problems:   STEMI involving right coronary artery (HCC)   Acute ST elevation myocardial infarction (STEMI) of inferior wall (HCC)    1.  S/P STEMI IMI POD x 1 Status post PCI and stent DES to RCA Elevated troponins Dementia Hypertension Diabetes Hyperlipidemia Urinary retention Relative hypotension Rule out retroperitoneal hematoma with hypotension and now significant hemoglobin drop Anemia . Plan Continue ICU level type care Continue to monitor right groin puncture site with PD Agree with temporary Foley catheter because of urinary retention Agree with holding blood pressure medications because of relative hypotension Recommend CT of the abdomen and pelvis without contrast to rule out retroperitoneal hematoma Discontinue amlodipine altogether We will reduce metoprolol and lisinopril dose starting tomorrow Continue aspirin and Plavix daily Maintain Aricept therapy for dementia Will reduce Lipitor to 40 mg once a day Consider transfer to telemetry tomorrow and then increase activity   Alwyn Pea, MD, [facility] [place] 01/22/2019 12:14 PM

## 2019-01-23 LAB — BASIC METABOLIC PANEL
Anion gap: 10 (ref 5–15)
BUN: 19 mg/dL (ref 8–23)
CO2: 23 mmol/L (ref 22–32)
Calcium: 8.6 mg/dL — ABNORMAL LOW (ref 8.9–10.3)
Chloride: 105 mmol/L (ref 98–111)
Creatinine, Ser: 1 mg/dL (ref 0.44–1.00)
GFR calc Af Amer: >60 mL/min (ref >60)
GFR calc non Af Amer: 52 mL/min — ABNORMAL LOW (ref >60)
Glucose, Bld: 124 mg/dL — ABNORMAL HIGH (ref 70–99)
Potassium: 3.6 mmol/L (ref 3.5–5.1)
Sodium: 138 mmol/L (ref 135–145)

## 2019-01-23 LAB — CBC WITH DIFFERENTIAL/PLATELET
Abs Immature Granulocytes: 0.04 10*3/uL (ref 0.00–0.07)
Basophils Absolute: 0.1 10*3/uL (ref 0.0–0.1)
Basophils Relative: 1 %
Eosinophils Absolute: 0.2 10*3/uL (ref 0.0–0.5)
Eosinophils Relative: 1 %
HCT: 35.3 % — ABNORMAL LOW (ref 36.0–46.0)
Hemoglobin: 11.4 g/dL — ABNORMAL LOW (ref 12.0–15.0)
Immature Granulocytes: 0 %
Lymphocytes Relative: 9 %
Lymphs Abs: 1.2 10*3/uL (ref 0.7–4.0)
MCH: 27.9 pg (ref 26.0–34.0)
MCHC: 32.3 g/dL (ref 30.0–36.0)
MCV: 86.5 fL (ref 80.0–100.0)
Monocytes Absolute: 1.5 10*3/uL — ABNORMAL HIGH (ref 0.1–1.0)
Monocytes Relative: 12 %
Neutro Abs: 9.9 10*3/uL — ABNORMAL HIGH (ref 1.7–7.7)
Neutrophils Relative %: 77 %
Platelets: 246 10*3/uL (ref 150–400)
RBC: 4.08 MIL/uL (ref 3.87–5.11)
RDW: 14 % (ref 11.5–15.5)
WBC: 12.9 10*3/uL — ABNORMAL HIGH (ref 4.0–10.5)
nRBC: 0 % (ref 0.0–0.2)

## 2019-01-23 LAB — GLUCOSE, CAPILLARY: Glucose-Capillary: 125 mg/dL — ABNORMAL HIGH (ref 70–99)

## 2019-01-23 MED ORDER — ATORVASTATIN CALCIUM 20 MG PO TABS
40.0000 mg | ORAL_TABLET | Freq: Every day | ORAL | Status: DC
  Administered 2019-01-23: 40 mg via ORAL
  Filled 2019-01-23: qty 2

## 2019-01-23 NOTE — Progress Notes (Addendum)
[facility] Cardiology    SUBJECTIVE: Patient dementia history is difficult but the patient denies any chest pain relatively pleasant   Vitals:   01/23/19 0402 01/23/19 0742 01/23/19 1547 01/23/19 1920  BP: (!) 141/63 134/63 (!) 149/69 (!) 142/66  Pulse: 96 93 88 95  Resp: 20 20 19 20   Temp: 98.9 F (37.2 C) 98.7 F (37.1 C) 97.8 F (36.6 C) 98.2 F (36.8 C)  TempSrc: Oral Oral Oral Oral  SpO2: 96% 95% 94% 95%  Height:         Intake/Output Summary (Last 24 hours) at 01/23/2019 2202 Last data filed at 01/23/2019 1922 Gross per 24 hour  Intake 3 ml  Output 1201 ml  Net -1198 ml      PHYSICAL EXAM  General: Well developed, well nourished, in no acute distress HEENT:  Normocephalic and atramatic Neck:  No JVD.  Lungs: Clear bilaterally to auscultation and percussion. Heart: HRRR . Normal S1 and S2 without gallops or murmurs.  Abdomen: Bowel sounds are positive, abdomen soft and non-tender  Msk:  Back normal, normal gait. Normal strength and tone for age. Extremities: No clubbing, cyanosis or edema.   Neuro: Alert and oriented X 3. Psych:  Good affect, responds appropriately   LABS: Basic Metabolic Panel: Recent Labs    01/22/19 0548 01/23/19 0619  NA 139 138  K 4.2 3.6  CL 105 105  CO2 24 23  GLUCOSE 107* 124*  BUN 24* 19  CREATININE 1.11* 1.00  CALCIUM 8.4* 8.6*  MG 2.0  --   PHOS 4.2  --    Liver Function Tests: Recent Labs    01/21/19 1726  AST 43*  ALT 16  ALKPHOS 77  BILITOT 0.8  PROT 7.5  ALBUMIN 3.7   No results for input(s): LIPASE, AMYLASE in the last 72 hours. CBC: Recent Labs    01/22/19 0548 01/23/19 0619  WBC 12.4* 12.9*  NEUTROABS 9.4* 9.9*  HGB 11.8* 11.4*  HCT 36.8 35.3*  MCV 86.2 86.5  PLT 264 246   Cardiac Enzymes: No results for input(s): CKTOTAL, CKMB, CKMBINDEX, TROPONINI in the last 72 hours. BNP: Invalid input(s): POCBNP D-Dimer: No results for input(s): DDIMER in the last 72 hours. Hemoglobin A1C: No results  for input(s): HGBA1C in the last 72 hours. Fasting Lipid Panel: No results for input(s): CHOL, HDL, LDLCALC, TRIG, CHOLHDL, LDLDIRECT in the last 72 hours. Thyroid Function Tests: No results for input(s): TSH, T4TOTAL, T3FREE, THYROIDAB in the last 72 hours.  Invalid input(s): FREET3 Anemia Panel: No results for input(s): VITAMINB12, FOLATE, FERRITIN, TIBC, IRON, RETICCTPCT in the last 72 hours.  CT ABDOMEN PELVIS WO CONTRAST  Result Date: 01/22/2019 CLINICAL DATA:  Hypotension with decreasing hemoglobin. Evaluate for retroperitoneal hematoma. EXAM: CT ABDOMEN AND PELVIS WITHOUT CONTRAST TECHNIQUE: Multidetector CT imaging of the abdomen and pelvis was performed following the standard protocol without IV contrast. COMPARISON:  None. FINDINGS: Lower chest: Minimal basilar atelectasis. Coronary artery calcification is evident. Hepatobiliary: No focal abnormality in the liver on this study without intravenous contrast. Gallbladder is surgically absent. No intrahepatic or extrahepatic biliary dilation. Pancreas: No focal mass lesion. No dilatation of the main duct. No intraparenchymal cyst. No peripancreatic edema. Spleen: No splenomegaly. No focal mass lesion. Adrenals/Urinary Tract: No adrenal nodule or mass. 2.3 cm lesion posterior interpolar right kidney has attenuation slightly higher than would be expected for a simple cyst. Left kidney unremarkable. Both kidneys excreting contrast, consistent with cardiac catheterization yesterday. No evidence for hydroureter. Urinary bladder decompressed  by a Foley catheter. Stomach/Bowel: Small hiatal hernia. Stomach otherwise unremarkable. Duodenum is normally positioned as is the ligament of Treitz. No small bowel wall thickening. No small bowel dilatation. The terminal ileum is normal. The appendix is normal. No gross colonic mass. No colonic wall thickening. Diverticular changes are noted in the left colon without evidence of diverticulitis. Vascular/Lymphatic:  There is abdominal aortic atherosclerosis without aneurysm. There is no gastrohepatic or hepatoduodenal ligament lymphadenopathy. No retroperitoneal or mesenteric lymphadenopathy. No pelvic sidewall lymphadenopathy. Reproductive: The uterus is unremarkable.  There is no adnexal mass. Other: No intraperitoneal free fluid. Musculoskeletal: There is some hemorrhage in the right groin region compatible with recent cardiac catheterization. No focal hematoma in the right groin. There is no extension of hemorrhage or evidence of hematoma into the right pelvic sidewall or retroperitoneal tissues of the abdomen/pelvis. IMPRESSION: 1. No retroperitoneal hematoma. There is some mild hemorrhage in the right groin, compatible with vascular access for yesterday's cardiac procedure. No focal hematoma in the right groin or extension of hemorrhage up into the right pelvic sidewall. 2. 2.3 cm interpolar right renal lesion with attenuation slightly higher than would be expected for a simple cyst. This is likely a cyst complicated by proteinaceous debris or hemorrhage. MRI could provide more definitive characterization as clinically warranted. Follow-up ultrasound in 3 months could be used to ensure stability. 3. Small hiatal hernia. 4.  Aortic Atherosclerois (ICD10-170.0) Electronically Signed   By: Misty Stanley M.D.   On: 01/22/2019 13:31     Echo echocardiogram 12/24 with relatively preserved left ventricular function  TELEMETRY: Sinus rhythm nonspecific changes no significant arrhythmias  ASSESSMENT AND PLAN:  Active Problems:   Dementia without behavioral disturbance (HCC)   STEMI involving right coronary artery (HCC)   Acute ST elevation myocardial infarction (STEMI) of inferior wall (HCC)    1.  Status post STEMI inferior wall Status post PCI and stent to RCA with DES Hypertension Hyperlipidemia Dementia Diabetes Thyroid disease  . Plan Continue telemetry Follow-up EKG troponins Increase activity on  the floor Will remove PAD pad in the morning Continue beta-blocker ACE inhibitor Maintain aspirin Plavix for at least 12 months Recommend continue Lipitor 40 mg once a day continue Zetia Continue Aricept for dementia Hopefully will be able to discharge in the morning   Yolonda Kida, MD 01/23/2019 10:02 PM

## 2019-01-23 NOTE — Progress Notes (Addendum)
Patient ID: Amanda [name], female   DOB: 01-19-1934, 85 y.o.   MRN: [id number] PROGRESS NOTE    Amanda [name]  LZJ:[id number] DOB: 17-Jul-1933 DOA: 01/21/2019 PCP: Einar Pheasant, MD    Brief Narrative:  This is an 85 yo female with a PMH of Hypothyroidism, HTN, Hypercholesterolemia, Dementia, and Bilateral Carotid Bruit.  She presented to [facility] ER on 12/25 with significant diaphoresis.  She was discharged during the earlier part of the day on 01/21/19 after being medically managed for unstable angina by cardiology with outpt f/u.  Patient was discharged home and within a short while returned as family described patient becoming diaphoretic and had shakes with EKG done by EMS revealing ST elevation inferiorly with reciprocal depressions laterally.  Patient was admitted with code STEMI.  Patient was sent to cardiac cath immediately, transferred to ICU for observation under critical care team.     Consultants:   Cardiology  Procedures:  Cardiac catheterization 01/21/2019  Prox RCA lesion is 95% stenosed.  Ost LAD lesion is 25% stenosed.  A drug-eluting stent was successfully placed using a STENT RESOLUTE ONYX 3.0X22.  Post intervention, there is a 0% residual stenosis.  Mild reduced left ventricular function global ejection fraction around 45%  Insignificant disease and LAD left main circumflex  Conclusion Mild reduced overall left ventricular function around 45% globally Insignificant disease in the left main LAD and circumflex Successful PCI and stent of mid RCA lesion 95% reduced to 0% after deployment and placement of a DES 3.0 x 22 mm stent Failed minx deployment Placement of FemoStop overnight Patient to be maintained on aspirin Plavix Statin was restarted    CT abd 1. No retroperitoneal hematoma. There is some mild hemorrhage in the right groin, compatible with vascular access for yesterday's cardiac procedure. No focal hematoma in the right groin or extension  of hemorrhage up into the right pelvic sidewall. 2. 2.3 cm interpolar right renal lesion with attenuation slightly higher than would be expected for a simple cyst. This is likely a cyst complicated by proteinaceous debris or hemorrhage. MRI could provide more definitive characterization as clinically warranted. Follow-up ultrasound in 3 months could be used to ensure stability. 3. Small hiatal hernia. 4.  Aortic Atherosclerois (ICD10-170.0)   Antimicrobials:   None   Subjective: Pt without cp/sob.No n/v/or abd pain. Objective: Vitals:   01/22/19 2100 01/22/19 2132 01/23/19 0402 01/23/19 0742  BP: 131/62 (!) 111/52 (!) 141/63 134/63  Pulse: 97 87 96 93  Resp: (!) 22 19 20 20   Temp:  98.6 F (37 C) 98.9 F (37.2 C) 98.7 F (37.1 C)  TempSrc:  Oral Oral Oral  SpO2: 94% 95% 96% 95%  Height:        Intake/Output Summary (Last 24 hours) at 01/23/2019 1331 Last data filed at 01/23/2019 0915 Gross per 24 hour  Intake 3 ml  Output 451 ml  Net -448 ml   There were no vitals filed for this visit.  Examination:  General exam: Appears calm and comfortable, nad Respiratory system: Clear to auscultation. Respiratory effort normal. Cardiovascular system: S1 & S2 heard, RRR. No JVD, murmurs, rubs, gallops or clicks Gastrointestinal system: Abdomen is nondistended, soft and nontender.  Normal bowel sounds heard. No rebound Central nervous system: awake, conversative, baseline confusion No focal neurological deficits. Extremities: No cyanosis., no edema. Lt Groin with ecchymosis, PD in place. Pedal pulses b/l 2+ warm, well perfused foot  Skin: Warm dry Psychiatry:  Mood & affect appropriate in the setting.  Data Reviewed: I have personally reviewed following labs and imaging studies  CBC: Recent Labs  Lab 01/19/19 1944 01/21/19 0309 01/21/19 1726 01/22/19 0548 01/23/19 0619  WBC 11.7* 10.9* 14.9* 12.4* 12.9*  NEUTROABS 8.4*  --  12.1* 9.4* 9.9*  HGB 13.8 14.0 15.1*  11.8* 11.4*  HCT 42.2 42.5 44.8 36.8 35.3*  MCV 86.7 84.8 81.9 86.2 86.5  PLT 323 290 293 264 246   Basic Metabolic Panel: Recent Labs  Lab 01/20/19 0435 01/21/19 0309 01/21/19 1726 01/22/19 0548 01/23/19 0619  NA 142 140 137 139 138  K 5.3* 3.9 3.9 4.2 3.6  CL 107 103 101 105 105  CO2 GLUCOSE 122* 132* 195* 107* 124*  BUN 24* 19  CREATININE 0.95 1.03* 1.18* 1.11* 1.00  CALCIUM 9.4 9.2 9.3 8.4* 8.6*  MG  --   --   --  2.0  --   PHOS  --   --   --  4.2  --    GFR: Estimated Creatinine Clearance: 41.9 mL/min (by C-G formula based on SCr of 1 mg/dL). Liver Function Tests: Recent Labs  Lab 01/19/19 1944 01/21/19 1726  AST 23 43*  ALT 12 16  ALKPHOS 75 77  BILITOT 0.8 0.8  PROT 7.3 7.5  ALBUMIN 3.7 3.7   Recent Labs  Lab 01/19/19 1944  LIPASE 29   No results for input(s): AMMONIA in the last 168 hours. Coagulation Profile: Recent Labs  Lab 01/19/19 1944 01/21/19 1726  INR 0.9 1.0   Cardiac Enzymes: No results for input(s): CKTOTAL, CKMB, CKMBINDEX, TROPONINI in the last 168 hours. BNP (last 3 results) No results for input(s): PROBNP in the last 8760 hours. HbA1C: No results for input(s): HGBA1C in the last 72 hours. CBG: Recent Labs  Lab 01/20/19 1704 01/20/19 2110 01/21/19 0750 01/21/19 1116 01/21/19 1918  GLUCAP 137* 148* 123* 113* 125*   Lipid Profile: No results for input(s): CHOL, HDL, LDLCALC, TRIG, CHOLHDL, LDLDIRECT in the last 72 hours. Thyroid Function Tests: No results for input(s): TSH, T4TOTAL, FREET4, T3FREE, THYROIDAB in the last 72 hours. Anemia Panel: No results for input(s): VITAMINB12, FOLATE, FERRITIN, TIBC, IRON, RETICCTPCT in the last 72 hours. Sepsis Labs: No results for input(s): PROCALCITON, LATICACIDVEN in the last 168 hours.  Recent Results (from the past 240 hour(s))  Respiratory Panel by RT PCR (Flu A&B, Covid) - Nasopharyngeal Swab     Status: None   Collection Time: 01/19/19  7:42 PM    Specimen: Nasopharyngeal Swab  Result Value Ref Range Status   SARS Coronavirus 2 by RT PCR NEGATIVE NEGATIVE Final    Comment: (NOTE) SARS-CoV-2 target nucleic acids are NOT DETECTED. The SARS-CoV-2 RNA is generally detectable in upper respiratoy specimens during the acute phase of infection. The lowest concentration of SARS-CoV-2 viral copies this assay can detect is 131 copies/mL. A negative result does not preclude SARS-Cov-2 infection and should not be used as the sole basis for treatment or other patient management decisions. A negative result may occur with  improper specimen collection/handling, submission of specimen other than nasopharyngeal swab, presence of viral mutation(s) within the areas targeted by this assay, and inadequate number of viral copies (<131 copies/mL). A negative result must be combined with clinical observations, patient history, and epidemiological information. The expected result is Negative. Fact Sheet for Patients:  https://www.moore.com/ Fact Sheet for Healthcare Providers:  https://www.young.biz/ This test is not yet ap proved or cleared by the [place]  FDA and  has been authorized for detection and/or diagnosis of SARS-CoV-2 by FDA under an Emergency Use Authorization (EUA). This EUA will remain  in effect (meaning this test can be used) for the duration of the COVID-19 declaration under Section 564(b)(1) of the Act, 21 U.S.C. section 360bbb-3(b)(1), unless the authorization is terminated or revoked sooner.    Influenza A by PCR NEGATIVE NEGATIVE Final   Influenza B by PCR NEGATIVE NEGATIVE Final    Comment: (NOTE) The Xpert Xpress SARS-CoV-2/FLU/RSV assay is intended as an aid in  the diagnosis of influenza from Nasopharyngeal swab specimens and  should not be used as a sole basis for treatment. Nasal washings and  aspirates are unacceptable for Xpert Xpress SARS-CoV-2/FLU/RSV  testing. Fact Sheet  for Patients: https://www.moore.com/ Fact Sheet for Healthcare Providers: https://www.young.biz/ This test is not yet approved or cleared by the [place] FDA and  has been authorized for detection and/or diagnosis of SARS-CoV-2 by  FDA under an Emergency Use Authorization (EUA). This EUA will remain  in effect (meaning this test can be used) for the duration of the  Covid-19 declaration under Section 564(b)(1) of the Act, 21  U.S.C. section 360bbb-3(b)(1), unless the authorization is  terminated or revoked. Performed at [facility], [address] [place], [place] [address]   MRSA PCR Screening     Status: None   Collection Time: 01/21/19  7:45 PM   Specimen: Nasopharyngeal  Result Value Ref Range Status   MRSA by PCR NEGATIVE NEGATIVE Final    Comment:        The GeneXpert MRSA Assay (FDA approved for NASAL specimens only), is one component of a comprehensive MRSA colonization surveillance program. It is not intended to diagnose MRSA infection nor to guide or monitor treatment for MRSA infections. Performed at [facility], [address], [place], [place] [address]          Radiology Studies: CT ABDOMEN PELVIS WO CONTRAST  Result Date: 01/22/2019 CLINICAL DATA:  Hypotension with decreasing hemoglobin. Evaluate for retroperitoneal hematoma. EXAM: CT ABDOMEN AND PELVIS WITHOUT CONTRAST TECHNIQUE: Multidetector CT imaging of the abdomen and pelvis was performed following the standard protocol without IV contrast. COMPARISON:  None. FINDINGS: Lower chest: Minimal basilar atelectasis. Coronary artery calcification is evident. Hepatobiliary: No focal abnormality in the liver on this study without intravenous contrast. Gallbladder is surgically absent. No intrahepatic or extrahepatic biliary dilation. Pancreas: No focal mass lesion. No dilatation of the main duct. No intraparenchymal cyst. No peripancreatic edema.  Spleen: No splenomegaly. No focal mass lesion. Adrenals/Urinary Tract: No adrenal nodule or mass. 2.3 cm lesion posterior interpolar right kidney has attenuation slightly higher than would be expected for a simple cyst. Left kidney unremarkable. Both kidneys excreting contrast, consistent with cardiac catheterization yesterday. No evidence for hydroureter. Urinary bladder decompressed by a Foley catheter. Stomach/Bowel: Small hiatal hernia. Stomach otherwise unremarkable. Duodenum is normally positioned as is the ligament of Treitz. No small bowel wall thickening. No small bowel dilatation. The terminal ileum is normal. The appendix is normal. No gross colonic mass. No colonic wall thickening. Diverticular changes are noted in the left colon without evidence of diverticulitis. Vascular/Lymphatic: There is abdominal aortic atherosclerosis without aneurysm. There is no gastrohepatic or hepatoduodenal ligament lymphadenopathy. No retroperitoneal or mesenteric lymphadenopathy. No pelvic sidewall lymphadenopathy. Reproductive: The uterus is unremarkable.  There is no adnexal mass. Other: No intraperitoneal free fluid. Musculoskeletal: There is some hemorrhage in the right groin region compatible with recent cardiac catheterization. No  focal hematoma in the right groin. There is no extension of hemorrhage or evidence of hematoma into the right pelvic sidewall or retroperitoneal tissues of the abdomen/pelvis. IMPRESSION: 1. No retroperitoneal hematoma. There is some mild hemorrhage in the right groin, compatible with vascular access for yesterday's cardiac procedure. No focal hematoma in the right groin or extension of hemorrhage up into the right pelvic sidewall. 2. 2.3 cm interpolar right renal lesion with attenuation slightly higher than would be expected for a simple cyst. This is likely a cyst complicated by proteinaceous debris or hemorrhage. MRI could provide more definitive characterization as clinically warranted.  Follow-up ultrasound in 3 months could be used to ensure stability. 3. Small hiatal hernia. 4.  Aortic Atherosclerois (ICD10-170.0) Electronically Signed   By: Kennith CenterEric  Mansell M.D.   On: 01/22/2019 13:31   CARDIAC CATHETERIZATION  Result Date: 01/21/2019  Prox RCA lesion is 95% stenosed.  Ost LAD lesion is 25% stenosed.  A drug-eluting stent was successfully placed using a STENT RESOLUTE ONYX 3.0X22.  Post intervention, there is a 0% residual stenosis.  Mild reduced left ventricular function global ejection fraction around 45%  Insignificant disease and LAD left main circumflex  Conclusion Mild reduced overall left ventricular function around 45% globally Insignificant disease in the left main LAD and circumflex Successful PCI and stent of mid RCA lesion 95% reduced to 0% after deployment and placement of a DES 3.0 x 22 mm stent Failed minx deployment Placement of FemoStop overnight Patient to be maintained on aspirin Plavix Statin was restarted        Scheduled Meds: . aspirin  81 mg Oral Daily  . atorvastatin  80 mg Oral q1800  . Chlorhexidine Gluconate Cloth  6 each Topical Daily  . clopidogrel  75 mg Oral Q breakfast  . donepezil  10 mg Oral QHS  . ezetimibe  10 mg Oral Daily  . levothyroxine  75 mcg Oral Q0600  . lisinopril  20 mg Oral Daily  . metoprolol tartrate  25 mg Oral BID  . sodium chloride flush  3 mL Intravenous Q12H   Continuous Infusions: . sodium chloride      Assessment & Plan:   Active Problems:   Dementia without behavioral disturbance (HCC)   STEMI involving right coronary artery (HCC)   Acute ST elevation myocardial infarction (STEMI) of inferior wall (HCC)  #1 STEMI EKG changes +elevated TP Secondary to 95% stenosis of proximal RCA Status post DES placement-continue on aspirin, Plavix, Lipitor, beta blk.  Lipitor reduced to 40mg  per cards recommendation. Per cardiology discontinue amlodipine altogether  CT abd negative for retroperitoneal bleed,  see above results-h/h stable. Telemetry     #2 hypothyroidism Continue on Synthroid  #3 dementia Continue on donepezil  #4 urinary retention- foley was placed. Will continue and try voiding trial tomorrow, since PD still in place at right groin   #5 leukocytosis-afebrile. Likely reactive/stress induced from STEMI. Continue to monitor   DVT prophylaxis: SCD Code Status:full Family Communication: None at bedside Disposition Plan:  PT consult, possible d/c in 1-2 days when medically stable.      LOS: 2 days   Time spent: 45 minutes with more than 50% COC    Lynn ItoSahar Lyn Joens, MD [facility] Pager [phone]  If 7PM-7AM, please contact night-coverage [email or web address] Password [email or web address] 01/23/2019, 1:31 PM

## 2019-01-23 NOTE — Plan of Care (Signed)
  Problem: Coping: Goal: Level of anxiety will decrease Outcome: Not Progressing  Tearful Problem: Elimination: Goal: Will not experience complications related to bowel motility Outcome: Progressing

## 2019-01-24 ENCOUNTER — Encounter: Payer: Self-pay | Admitting: Cardiology

## 2019-01-24 ENCOUNTER — Telehealth: Payer: Self-pay

## 2019-01-24 DIAGNOSIS — I10 Essential (primary) hypertension: Secondary | ICD-10-CM

## 2019-01-24 MED ORDER — CLOPIDOGREL BISULFATE 75 MG PO TABS: 75.0000 mg | ORAL_TABLET | Freq: Every day | ORAL | 0 refills | Status: DC

## 2019-01-24 MED ORDER — LISINOPRIL 20 MG PO TABS: 20.0000 mg | ORAL_TABLET | Freq: Every day | ORAL | 0 refills | Status: DC

## 2019-01-24 MED ORDER — ASPIRIN 81 MG PO CHEW: 81.0000 mg | CHEWABLE_TABLET | Freq: Every day | ORAL | 0 refills | Status: DC

## 2019-01-24 MED ORDER — ATORVASTATIN CALCIUM 40 MG PO TABS: 40.0000 mg | ORAL_TABLET | Freq: Every day | ORAL | 0 refills | Status: DC

## 2019-01-24 MED ORDER — METOPROLOL TARTRATE 25 MG PO TABS: 25.0000 mg | ORAL_TABLET | Freq: Two times a day (BID) | ORAL | 0 refills | Status: DC

## 2019-01-24 NOTE — Telephone Encounter (Signed)
Patient is currently readmitted since discharge 01/21/19. Will follow as appropriate.

## 2019-01-24 NOTE — Care Management Important Message (Signed)
Important Message  Patient Details  Name: NADALEE NEISWENDER MRN: [id number] Date of Birth: 1933/10/08   Medicare Important Message Given:  Yes     Dannette Barbara 01/24/2019, 10:58 AM

## 2019-01-24 NOTE — TOC Transition Note (Signed)
Transition of Care [facility]) - CM/SW Discharge Note   Patient Details  Name: CORINE SOLORIO MRN: [id number] Date of Birth: 18-Feb-1933  Transition of Care [facility]) CM/SW Contact:  Ross Ludwig, LCSW Phone Number: 01/24/2019, 6:22 PM   Clinical Narrative:    Patient is an 85 year old female who is alert and oriented x3.  Patient's granddaughter helps take care of patient.  CSW spoke to patient's granddaughter who said they did not have preference for home health agency.  Patient is in network with [facility], CSW made referral to [facility] they are able to accept patient, patient also need a rolling walker, this was provided before patient discharged.  Patient and granddaughter did not express any other concerns or issues about discharging back home.   Final next level of care: [place] Two Barriers to Discharge: Barriers Resolved   Patient Goals and CMS Choice Patient states their goals for this hospitalization and ongoing recovery are:: To return back home with home health. CMS Medicare.gov Compare Post Acute Care list provided to:: Patient Represenative (must comment) Choice offered to / list presented to : [place] / [place]  Discharge Placement  Discharging back home.                     Discharge Plan and Services  Patient will be returning back home.              DME Arranged: Gilford Rile DME Agency: [facility] Date DME Agency Contacted: 01/24/19 Time DME Agency Contacted: 1200 Representative spoke with at DME Agency: Blake Divine HH Arranged: PT [place] Agency: [facility] [place] Date [place]: 01/24/19 Time HH Agency Contacted: 46 Representative spoke with at [place]: Cassie  Social Determinants of Health ([place]) Interventions     Readmission Risk Interventions No flowsheet data found.

## 2019-01-24 NOTE — Discharge Summary (Signed)
Amanda [name] VQQ:[id number] DOB: [birth year] DOA: 01/21/2019  PCP: Dale Carpio, MD  Admit date: 01/21/2019 Discharge date: 01/24/2019  Admitted From: Home Disposition:  HOme  Recommendations for Outpatient Follow-up:  1. Follow up with PCP in 1 week 2. Please obtain BMP/CBC in one week 3. Please follow up on the following pending results:none 4. Dr. Salome Arnt cardiology in one week  Home Health:PT    Discharge Condition:Stable CODE STATUS:FULL Diet recommendation: Heart Healthy   Brief/Interim Summary: This is an 85 yo female with a PMH of Hypothyroidism, HTN, Hypercholesterolemia, Dementia, and Bilateral Carotid Bruit. She presented to [facility] ER on 12/25 with significant diaphoresis. She was discharged during the earlier part of the day on 01/21/19 after being medically managed for unstable angina by cardiology with outpt f/u.  Patient was discharged home and within a short while returned as family described patient becoming diaphoretic and had shakes with EKG done by EMS revealing ST elevation inferiorly with reciprocal depressions laterally.  Patient was admitted with code STEMI.  Patient was sent to cardiac cath immediately, DES to RCA when she was found with 95% stenosis of proximal RCA and was then transferred to ICU for overnight observation.  Her H&H had dropped mildly a CT of abdomen was obtained she did not have retroperitoneal bleed please see below for full results.  Her H&H remained stable.  She was on telemetry.  She received physical therapy today which they recommended home PT.  Her amlodipine was discontinued and she was started on ACE inhibitor's.  She was started on Lipitor initially at 80 mg and decreased to 40 mg per cards recommendation.  Patient is not a candidate for cardiac rehab given dementia and overall medical condition per cardiology.  Cardiology was okay with patient being discharged today home.  Discharge Diagnoses:  Active Problems:   Dementia without  behavioral disturbance (HCC)   STEMI involving right coronary artery (HCC)   Acute ST elevation myocardial infarction (STEMI) of inferior wall [facility])    Discharge Instructions  Discharge Instructions    Call MD for:  difficulty breathing, headache or visual disturbances   Complete by: As directed    Call MD for:  temperature >100.4   Complete by: As directed    Diet - low sodium heart healthy   Complete by: As directed    Discharge instructions   Complete by: As directed    Follow up with Dr. Sherlynn Stalls cardiology in one week F/u with pcp in one week   Face-to-face encounter (required for Medicare/Medicaid patients)   Complete by: As directed    I Lynn Ito certify that this patient is under my care and that I, or a nurse practitioner or physician's assistant working with me, had a face-to-face encounter that meets the physician face-to-face encounter requirements with this patient on 01/24/2019. The encounter with the patient was in whole, or in part for the following medical condition(s) which is the primary reason for home health care (List medical condition): dementia, STEMI   The encounter with the patient was in whole, or in part, for the following medical condition, which is the primary reason for home health care: MI   I certify that, based on my findings, the following services are medically necessary home health services: Physical therapy   Reason for Medically Necessary Home Health Services: Therapy- Instruction on Safe use of Assistive Devices for ADLs   My clinical findings support the need for the above services: Unsafe ambulation due to balance issues  Further, I certify that my clinical findings support that this patient is homebound due to: Mental confusion   Home Health   Complete by: As directed    To provide the following care/treatments: PT   Increase activity slowly   Complete by: As directed      Allergies as of 01/24/2019      Reactions   Sulfa Antibiotics        Medication List    STOP taking these medications   amLODipine 5 MG tablet Commonly known as: NORVASC   aspirin 81 MG EC tablet Replaced by: aspirin 81 MG chewable tablet   metoprolol succinate 25 MG 24 hr tablet Commonly known as: TOPROL-XL     TAKE these medications   aspirin 81 MG chewable tablet Chew 1 tablet (81 mg total) by mouth daily. Start taking on: January 25, 2019 Replaces: aspirin 81 MG EC tablet   atorvastatin 40 MG tablet Commonly known as: LIPITOR Take 1 tablet (40 mg total) by mouth daily at 6 PM.   clopidogrel 75 MG tablet Commonly known as: PLAVIX Take 1 tablet (75 mg total) by mouth daily. What changed: Another medication with the same name was added. Make sure you understand how and when to take each.   clopidogrel 75 MG tablet Commonly known as: PLAVIX Take 1 tablet (75 mg total) by mouth daily with breakfast. Start taking on: January 25, 2019 What changed: You were already taking a medication with the same name, and this prescription was added. Make sure you understand how and when to take each.   donepezil 10 MG tablet Commonly known as: Aricept Take 1 tablet (10 mg total) by mouth at bedtime.   ezetimibe 10 MG tablet Commonly known as: Zetia Take 1 tablet (10 mg total) by mouth daily.   levothyroxine 75 MCG tablet Commonly known as: Synthroid 1/2 tablet q day What changed:   how much to take  how to take this  when to take this   lisinopril 20 MG tablet Commonly known as: ZESTRIL Take 1 tablet (20 mg total) by mouth daily. Start taking on: January 25, 2019   metoprolol tartrate 25 MG tablet Commonly known as: LOPRESSOR Take 1 tablet (25 mg total) by mouth 2 (two) times daily.            Durable Medical Equipment  (From admission, onward)         Start     Ordered   01/24/19 1229  DME Walker  Once    Question Answer Comment  Walker: With 5 Inch Wheels   Patient needs a walker to treat with the following  condition Dependent on walker for ambulation      01/24/19 1229         Follow-up Information    Lamar Blinks, MD Follow up in 1 week(s).   Specialty: Cardiology Why: post STEMI Contact information: [address] [facility] [place] [address] [phone]        Dale Farmington, MD Follow up in 1 week(s).   Specialty: Internal Medicine Contact information: [address] [place] [address] [phone]          Allergies  Allergen Reactions  . Sulfa Antibiotics     Consultations:  Cardiology, critical care "  Procedures/Studies: CT ABDOMEN PELVIS WO CONTRAST  Result Date: 01/22/2019 CLINICAL DATA:  Hypotension with decreasing hemoglobin. Evaluate for retroperitoneal hematoma. EXAM: CT ABDOMEN AND PELVIS WITHOUT CONTRAST TECHNIQUE: Multidetector CT imaging of  the abdomen and pelvis was performed following the standard protocol without IV contrast. COMPARISON:  None. FINDINGS: Lower chest: Minimal basilar atelectasis. Coronary artery calcification is evident. Hepatobiliary: No focal abnormality in the liver on this study without intravenous contrast. Gallbladder is surgically absent. No intrahepatic or extrahepatic biliary dilation. Pancreas: No focal mass lesion. No dilatation of the main duct. No intraparenchymal cyst. No peripancreatic edema. Spleen: No splenomegaly. No focal mass lesion. Adrenals/Urinary Tract: No adrenal nodule or mass. 2.3 cm lesion posterior interpolar right kidney has attenuation slightly higher than would be expected for a simple cyst. Left kidney unremarkable. Both kidneys excreting contrast, consistent with cardiac catheterization yesterday. No evidence for hydroureter. Urinary bladder decompressed by a Foley catheter. Stomach/Bowel: Small hiatal hernia. Stomach otherwise unremarkable. Duodenum is normally positioned as is the ligament of Treitz. No small bowel wall thickening. No small  bowel dilatation. The terminal ileum is normal. The appendix is normal. No gross colonic mass. No colonic wall thickening. Diverticular changes are noted in the left colon without evidence of diverticulitis. Vascular/Lymphatic: There is abdominal aortic atherosclerosis without aneurysm. There is no gastrohepatic or hepatoduodenal ligament lymphadenopathy. No retroperitoneal or mesenteric lymphadenopathy. No pelvic sidewall lymphadenopathy. Reproductive: The uterus is unremarkable.  There is no adnexal mass. Other: No intraperitoneal free fluid. Musculoskeletal: There is some hemorrhage in the right groin region compatible with recent cardiac catheterization. No focal hematoma in the right groin. There is no extension of hemorrhage or evidence of hematoma into the right pelvic sidewall or retroperitoneal tissues of the abdomen/pelvis. IMPRESSION: 1. No retroperitoneal hematoma. There is some mild hemorrhage in the right groin, compatible with vascular access for yesterday's cardiac procedure. No focal hematoma in the right groin or extension of hemorrhage up into the right pelvic sidewall. 2. 2.3 cm interpolar right renal lesion with attenuation slightly higher than would be expected for a simple cyst. This is likely a cyst complicated by proteinaceous debris or hemorrhage. MRI could provide more definitive characterization as clinically warranted. Follow-up ultrasound in 3 months could be used to ensure stability. 3. Small hiatal hernia. 4.  Aortic Atherosclerois (ICD10-170.0) Electronically Signed   By: Kennith Center M.D.   On: 01/22/2019 13:31   CT Angio Chest PE W and/or Wo Contrast  Result Date: 01/19/2019 CLINICAL DATA:  Code STEMI, chest pain and shortness of breath EXAM: CT ANGIOGRAPHY CHEST WITH CONTRAST TECHNIQUE: Multidetector CT imaging of the chest was performed using the standard protocol during bolus administration of intravenous contrast. Multiplanar CT image reconstructions and MIPs were  obtained to evaluate the vascular anatomy. CONTRAST:  75mL OMNIPAQUE IOHEXOL 350 MG/ML SOLN COMPARISON:  None. FINDINGS: Cardiovascular: There is a optimal opacification of the pulmonary arteries. There is no central,segmental, or subsegmental filling defects within the pulmonary arteries. There is moderate cardiomegaly. No evidence of right ventricular heart strain. Mild coronary artery and aortic atherosclerosis is seen. There is normal three-vessel brachiocephalic anatomy without proximal stenosis. The thoracic aorta is normal in appearance. Mediastinum/Nodes: No hilar, mediastinal, or axillary adenopathy. Thyroid gland, trachea, and esophagus demonstrate no significant findings. Lungs/Pleura: Mild streaky opacity seen at both lung bases. Small area of patchy airspace opacity in the left lung base. No pleural effusion or pneumothorax. Upper Abdomen: A small hiatal hernia present. No acute abnormalities present in the visualized portions of the upper abdomen. Musculoskeletal: No chest wall abnormality. No acute or significant osseous findings. Anterior flowing osteophytes seen within the midthoracic spine. Review of the MIP images confirms the above findings. IMPRESSION: No central, segmental,  or subsegmental pulmonary embolism. Streaky/patchy airspace opacity at both lung bases which could be due to atelectasis and/or early infectious etiology. Moderate cardiomegaly. Electronically Signed   By: Jonna Clark M.D.   On: 01/19/2019 23:30   CARDIAC CATHETERIZATION  Result Date: 01/21/2019  Prox RCA lesion is 95% stenosed.  Ost LAD lesion is 25% stenosed.  A drug-eluting stent was successfully placed using a STENT RESOLUTE ONYX 3.0X22.  Post intervention, there is a 0% residual stenosis.  Mild reduced left ventricular function global ejection fraction around 45%  Insignificant disease and LAD left main circumflex  Conclusion Mild reduced overall left ventricular function around 45% globally Insignificant  disease in the left main LAD and circumflex Successful PCI and stent of mid RCA lesion 95% reduced to 0% after deployment and placement of a DES 3.0 x 22 mm stent Failed minx deployment Placement of FemoStop overnight Patient to be maintained on aspirin Plavix Statin was restarted   DG Chest Portable 1 View  Result Date: 01/19/2019 CLINICAL DATA:  Chest pain and shortness of breath. Code STEMI. EXAM: PORTABLE CHEST 1 VIEW COMPARISON:  Radiographs 10/27/2017 and 11/04/2011. FINDINGS: 2006 hours. Lordotic positioning. The heart size and mediastinal contours are stable. There is mild aortic atherosclerosis. The lungs are clear. There is no pleural effusion or pneumothorax. Telemetry leads and external pacing pads are in place. IMPRESSION: No active cardiopulmonary process. Electronically Signed   By: Carey Bullocks M.D.   On: 01/19/2019 20:20   ECHOCARDIOGRAM COMPLETE  Result Date: 01/21/2019   ECHOCARDIOGRAM REPORT   Patient Name:   Amanda [name] Date of Exam: 01/20/2019 Medical Rec #:  [id number]     Height:       64.0 in Accession #:    [id number]    Weight:       161.8 lb Date of Birth:  1933-05-29     BSA:          1.79 m Patient Age:    85 years      BP:           172/76 mmHg Patient Gender: F             HR:           80 bpm. Exam Location:  [facility] Procedure: 2D Echo, Cardiac Doppler and Color Doppler Indications:     Chest pain 786.50  History:         Patient has no prior history of Echocardiogram examinations.                  Risk Factors:Hypertension. Dementia.  Sonographer:     Cristela Blue RDCS (AE) Referring Phys:  Kenn File DOUTOVA Diagnosing Phys: Alwyn Pea MD  Sonographer Comments: Suboptimal apical window. IMPRESSIONS  1. Left ventricular ejection fraction, by visual estimation, is 50 to 55%. The left ventricle has normal function. Mildly increased left ventricular posterior wall thickness. There is borderline left ventricular hypertrophy.  2. The left ventricle has no regional  wall motion abnormalities.  3. Global right ventricle has normal systolic function.The right ventricular size is normal. No increase in right ventricular wall thickness.  4. Left atrial size was normal.  5. Right atrial size was normal.  6. The mitral valve is normal in structure. Trivial mitral valve regurgitation.  7. The tricuspid valve is normal in structure.  8. The aortic valve is normal in structure. Aortic valve regurgitation is not visualized.  9. The pulmonic valve was grossly normal. Pulmonic valve  regurgitation is not visualized. 10. Normal pulmonary artery systolic pressure. FINDINGS  Left Ventricle: Left ventricular ejection fraction, by visual estimation, is 50 to 55%. The left ventricle has normal function. The left ventricle has no regional wall motion abnormalities. Mildly increased left ventricular posterior wall thickness. There is borderline left ventricular hypertrophy. Right Ventricle: The right ventricular size is normal. No increase in right ventricular wall thickness. Global RV systolic function is has normal systolic function. The tricuspid regurgitant velocity is 2.13 m/s, and with an assumed right atrial pressure  of 10 mmHg, the estimated right ventricular systolic pressure is normal at 28.1 mmHg. Left Atrium: Left atrial size was normal in size. Right Atrium: Right atrial size was normal in size Pericardium: There is no evidence of pericardial effusion. Mitral Valve: The mitral valve is normal in structure. Trivial mitral valve regurgitation. Tricuspid Valve: The tricuspid valve is normal in structure. Tricuspid valve regurgitation is mild. Aortic Valve: The aortic valve is normal in structure. Aortic valve regurgitation is not visualized. Aortic valve mean gradient measures 2.0 mmHg. Aortic valve peak gradient measures 4.5 mmHg. Aortic valve area, by VTI measures 2.62 cm. Pulmonic Valve: The pulmonic valve was grossly normal. Pulmonic valve regurgitation is not visualized. Pulmonic  regurgitation is not visualized. Aorta: The aortic root is normal in size and structure. IAS/Shunts: No atrial level shunt detected by color flow Doppler.  LEFT VENTRICLE PLAX 2D LVIDd:         3.78 cm  Diastology LVIDs:         2.76 cm  LV e' lateral:   4.46 cm/s LV PW:         1.27 cm  LV E/e' lateral: 10.2 LV IVS:        0.94 cm  LV e' medial:    4.79 cm/s LVOT diam:     2.00 cm  LV E/e' medial:  9.5 LV SV:         33 ml LV SV Index:   17.75 LVOT Area:     3.14 cm  RIGHT VENTRICLE RV Basal diam:  2.40 cm RV S prime:     21.40 cm/s TAPSE (M-mode): 3.5 cm LEFT ATRIUM             Index       RIGHT ATRIUM           Index LA diam:        3.50 cm 1.96 cm/m  RA Area:     14.30 cm LA Vol (A2C):   53.9 ml 30.15 ml/m RA Volume:   29.90 ml  16.72 ml/m LA Vol (A4C):   31.1 ml 17.39 ml/m LA Biplane Vol: 43.6 ml 24.39 ml/m  AORTIC VALVE                   PULMONIC VALVE AV Area (Vmax):    2.53 cm    PV Vmax:        0.76 m/s AV Area (Vmean):   2.36 cm    PV Peak grad:   2.3 mmHg AV Area (VTI):     2.62 cm    RVOT Peak grad: 3 mmHg AV Vmax:           106.50 cm/s AV Vmean:          67.800 cm/s AV VTI:            0.188 m AV Peak Grad:      4.5 mmHg AV Mean Grad:  2.0 mmHg LVOT Vmax:         85.70 cm/s LVOT Vmean:        50.900 cm/s LVOT VTI:          0.157 m LVOT/AV VTI ratio: 0.84  AORTA Ao Root diam: 2.70 cm MITRAL VALVE                         TRICUSPID VALVE MV Area (PHT): 2.34 cm              TR Peak grad:   18.1 mmHg MV PHT:        93.96 msec            TR Vmax:        213.00 cm/s MV Decel Time: 324 msec MV E velocity: 45.60 cm/s  103 cm/s  SHUNTS MV A velocity: 108.00 cm/s 70.3 cm/s Systemic VTI:  0.16 m MV E/A ratio:  0.42        1.5       Systemic Diam: 2.00 cm  Yolonda Kida MD Electronically signed by Yolonda Kida MD Signature Date/Time: 01/21/2019/10:13:29 AM    Final        Subjective: Patient seen and examined.  She denies any shortness of breath or chest pain.  Discharge  Exam: Vitals:   01/24/19 0556 01/24/19 0927  BP: (!) 149/59 (!) 118/52  Pulse: 86 85  Resp: 18 14  Temp: 98.2 F (36.8 C) (!) 97.5 F (36.4 C)  SpO2: 97% 97%   Vitals:   01/23/19 1547 01/23/19 1920 01/24/19 0556 01/24/19 0927  BP: (!) 149/69 (!) 142/66 (!) 149/59 (!) 118/52  Pulse: 88 95 86 85  Resp: 19 20 18 14   Temp: 97.8 F (36.6 C) 98.2 F (36.8 C) 98.2 F (36.8 C) (!) 97.5 F (36.4 C)  TempSrc: Oral Oral Oral Oral  SpO2: 94% 95% 97% 97%  Weight:   73.6 kg   Height:        General: Pt is alert, awake, not in acute distress Cardiovascular: RRR, S1/S2 +, no rubs, no gallops Respiratory: CTA bilaterally, no wheezing, no rhonchi Abdominal: Soft, NT, ND, bowel sounds + Extremities: no edema, no cyanosis, right groin femoral area with ecchymosis improving.  2+ pedal pulses bilaterally Neuro no focal deficits.  Baseline dementia   The results of significant diagnostics from this hospitalization (including imaging, microbiology, ancillary and laboratory) are listed below for reference.     Microbiology: Recent Results (from the past 240 hour(s))  Respiratory Panel by RT PCR (Flu A&B, Covid) - Nasopharyngeal Swab     Status: None   Collection Time: 01/19/19  7:42 PM   Specimen: Nasopharyngeal Swab  Result Value Ref Range Status   SARS Coronavirus 2 by RT PCR NEGATIVE NEGATIVE Final    Comment: (NOTE) SARS-CoV-2 target nucleic acids are NOT DETECTED. The SARS-CoV-2 RNA is generally detectable in upper respiratoy specimens during the acute phase of infection. The lowest concentration of SARS-CoV-2 viral copies this assay can detect is 131 copies/mL. A negative result does not preclude SARS-Cov-2 infection and should not be used as the sole basis for treatment or other patient management decisions. A negative result may occur with  improper specimen collection/handling, submission of specimen other than nasopharyngeal swab, presence of viral mutation(s) within the areas  targeted by this assay, and inadequate number of viral copies (<131 copies/mL). A negative result must be combined with clinical observations, patient history, and  epidemiological information. The expected result is Negative. Fact Sheet for Patients:  https://www.moore.com/ Fact Sheet for Healthcare Providers:  https://www.young.biz/ This test is not yet ap proved or cleared by the [place] FDA and  has been authorized for detection and/or diagnosis of SARS-CoV-2 by FDA under an Emergency Use Authorization (EUA). This EUA will remain  in effect (meaning this test can be used) for the duration of the COVID-19 declaration under Section 564(b)(1) of the Act, 21 U.S.C. section 360bbb-3(b)(1), unless the authorization is terminated or revoked sooner.    Influenza A by PCR NEGATIVE NEGATIVE Final   Influenza B by PCR NEGATIVE NEGATIVE Final    Comment: (NOTE) The Xpert Xpress SARS-CoV-2/FLU/RSV assay is intended as an aid in  the diagnosis of influenza from Nasopharyngeal swab specimens and  should not be used as a sole basis for treatment. Nasal washings and  aspirates are unacceptable for Xpert Xpress SARS-CoV-2/FLU/RSV  testing. Fact Sheet for Patients: https://www.moore.com/ Fact Sheet for Healthcare Providers: https://www.young.biz/ This test is not yet approved or cleared by the [place] FDA and  has been authorized for detection and/or diagnosis of SARS-CoV-2 by  FDA under an Emergency Use Authorization (EUA). This EUA will remain  in effect (meaning this test can be used) for the duration of the  Covid-19 declaration under Section 564(b)(1) of the Act, 21  U.S.C. section 360bbb-3(b)(1), unless the authorization is  terminated or revoked. Performed at [facility], [address] [place], [place] [address]   MRSA PCR Screening     Status: None   Collection Time: 01/21/19  7:45 PM    Specimen: Nasopharyngeal  Result Value Ref Range Status   MRSA by PCR NEGATIVE NEGATIVE Final    Comment:        The GeneXpert MRSA Assay (FDA approved for NASAL specimens only), is one component of a comprehensive MRSA colonization surveillance program. It is not intended to diagnose MRSA infection nor to guide or monitor treatment for MRSA infections. Performed at [facility], [address] [place], [place] [address]      Labs: BNP (last 3 results) Recent Labs    01/20/19 0108  BNP 76.0   Basic Metabolic Panel: Recent Labs  Lab 01/20/19 0435 01/21/19 0309 01/21/19 1726 01/22/19 0548 01/23/19 0619  NA 142 140 137 139 138  K 5.3* 3.9 3.9 4.2 3.6  CL 107 103 101 105 105  CO2 GLUCOSE 122* 132* 195* 107* 124*  BUN 24* 19  CREATININE 0.95 1.03* 1.18* 1.11* 1.00  CALCIUM 9.4 9.2 9.3 8.4* 8.6*  MG  --   --   --  2.0  --   PHOS  --   --   --  4.2  --    Liver Function Tests: Recent Labs  Lab 01/19/19 1944 01/21/19 1726  AST 23 43*  ALT 12 16  ALKPHOS 75 77  BILITOT 0.8 0.8  PROT 7.3 7.5  ALBUMIN 3.7 3.7   Recent Labs  Lab 01/19/19 1944  LIPASE 29   No results for input(s): AMMONIA in the last 168 hours. CBC: Recent Labs  Lab 01/19/19 1944 01/21/19 0309 01/21/19 1726 01/22/19 0548 01/23/19 0619  WBC 11.7* 10.9* 14.9* 12.4* 12.9*  NEUTROABS 8.4*  --  12.1* 9.4* 9.9*  HGB 13.8 14.0 15.1* 11.8* 11.4*  HCT 42.2 42.5 44.8 36.8 35.3*  MCV 86.7 84.8 81.9 86.2 86.5  PLT 323 290 293 264 246  Cardiac Enzymes: No results for input(s): CKTOTAL, CKMB, CKMBINDEX, TROPONINI in the last 168 hours. BNP: Invalid input(s): POCBNP CBG: Recent Labs  Lab 01/20/19 1704 01/20/19 2110 01/21/19 0750 01/21/19 1116 01/21/19 1918  GLUCAP 137* 148* 123* 113* 125*   D-Dimer No results for input(s): DDIMER in the last 72 hours. Hgb A1c No results for input(s): HGBA1C in the last 72 hours. Lipid Profile No results for  input(s): CHOL, HDL, LDLCALC, TRIG, CHOLHDL, LDLDIRECT in the last 72 hours. Thyroid function studies No results for input(s): TSH, T4TOTAL, T3FREE, THYROIDAB in the last 72 hours.  Invalid input(s): FREET3 Anemia work up No results for input(s): VITAMINB12, FOLATE, FERRITIN, TIBC, IRON, RETICCTPCT in the last 72 hours. Urinalysis    Component Value Date/Time   COLORURINE YELLOW (A) 10/27/2017 1348   APPEARANCEUR HAZY (A) 10/27/2017 1348   APPEARANCEUR Hazy 11/04/2011 0934   LABSPEC 1.015 10/27/2017 1348   LABSPEC 1.010 11/04/2011 0934   PHURINE 8.0 10/27/2017 1348   GLUCOSEU NEGATIVE 10/27/2017 1348   GLUCOSEU Negative 11/04/2011 0934   HGBUR NEGATIVE 10/27/2017 1348   BILIRUBINUR NEGATIVE 10/27/2017 1348   BILIRUBINUR neg 11/11/2011 1150   BILIRUBINUR Negative 11/04/2011 0934   KETONESUR NEGATIVE 10/27/2017 1348   PROTEINUR NEGATIVE 10/27/2017 1348   UROBILINOGEN 0.2 11/11/2011 1150   NITRITE NEGATIVE 10/27/2017 1348   LEUKOCYTESUR NEGATIVE 10/27/2017 1348   LEUKOCYTESUR Negative 11/04/2011 0934   Sepsis Labs Invalid input(s): PROCALCITONIN,  WBC,  [facility] Recent Results (from the past 240 hour(s))  Respiratory Panel by RT PCR (Flu A&B, Covid) - Nasopharyngeal Swab     Status: None   Collection Time: 01/19/19  7:42 PM   Specimen: Nasopharyngeal Swab  Result Value Ref Range Status   SARS Coronavirus 2 by RT PCR NEGATIVE NEGATIVE Final    Comment: (NOTE) SARS-CoV-2 target nucleic acids are NOT DETECTED. The SARS-CoV-2 RNA is generally detectable in upper respiratoy specimens during the acute phase of infection. The lowest concentration of SARS-CoV-2 viral copies this assay can detect is 131 copies/mL. A negative result does not preclude SARS-Cov-2 infection and should not be used as the sole basis for treatment or other patient management decisions. A negative result may occur with  improper specimen collection/handling, submission of specimen  other than nasopharyngeal swab, presence of viral mutation(s) within the areas targeted by this assay, and inadequate number of viral copies (<131 copies/mL). A negative result must be combined with clinical observations, patient history, and epidemiological information. The expected result is Negative. Fact Sheet for Patients:  https://www.moore.com/ Fact Sheet for Healthcare Providers:  https://www.young.biz/ This test is not yet ap proved or cleared by the [place] FDA and  has been authorized for detection and/or diagnosis of SARS-CoV-2 by FDA under an Emergency Use Authorization (EUA). This EUA will remain  in effect (meaning this test can be used) for the duration of the COVID-19 declaration under Section 564(b)(1) of the Act, 21 U.S.C. section 360bbb-3(b)(1), unless the authorization is terminated or revoked sooner.    Influenza A by PCR NEGATIVE NEGATIVE Final   Influenza B by PCR NEGATIVE NEGATIVE Final    Comment: (NOTE) The Xpert Xpress SARS-CoV-2/FLU/RSV assay is intended as an aid in  the diagnosis of influenza from Nasopharyngeal swab specimens and  should not be used as a sole basis for treatment. Nasal washings and  aspirates are unacceptable for Xpert Xpress SARS-CoV-2/FLU/RSV  testing. Fact Sheet for Patients: https://www.moore.com/ Fact Sheet for Healthcare Providers: https://www.young.biz/ This test is not yet approved or cleared  by the [place] FDA and  has been authorized for detection and/or diagnosis of SARS-CoV-2 by  FDA under an Emergency Use Authorization (EUA). This EUA will remain  in effect (meaning this test can be used) for the duration of the  Covid-19 declaration under Section 564(b)(1) of the Act, 21  U.S.C. section 360bbb-3(b)(1), unless the authorization is  terminated or revoked. Performed at [facility], [address] [place], [place]  [address]   MRSA PCR Screening     Status: None   Collection Time: 01/21/19  7:45 PM   Specimen: Nasopharyngeal  Result Value Ref Range Status   MRSA by PCR NEGATIVE NEGATIVE Final    Comment:        The GeneXpert MRSA Assay (FDA approved for NASAL specimens only), is one component of a comprehensive MRSA colonization surveillance program. It is not intended to diagnose MRSA infection nor to guide or monitor treatment for MRSA infections. Performed at [facility], [address] [place], [place] [address]      Time coordinating discharge: Over 30 minutes  SIGNED:   Lynn ItoSahar Kabria Hetzer, MD  [facility] 01/24/2019, 12:37 PM Pager   If 7PM-7AM, please contact night-coverage [email or web address] Password [email or web address]

## 2019-01-24 NOTE — Evaluation (Signed)
Physical Therapy Evaluation Patient Details Name: Amanda [name] MRN: [id number] DOB: 1933/02/02 Today's Date: 01/24/2019   History of Present Illness  Patient is an 85 year old female admitted with STEMI. S/P cardiac cath 12/25.PMH includes: hypothyroidism.  Clinical Impression  Patient received in bed, reports her husband is coming to get her soon. Patient alert, not oriented to place, time or situation. She is pleasant. Agrees to PT assessment. She performed bed mobility with mod independence, transfers with min assist due to unsteadiness. She ambulated 120 feet with hand held assist and other hand on rail in hallway. She was unsteady and will benefit from RW for stability with dynamic activities. She will continue to benefit from skilled PT to improve strength, activity tolerance and safety with mobility.         Follow Up Recommendations Home health PT- if family assist at home    Equipment Recommendations  Rolling walker with 5" wheels    Recommendations for Other Services       Precautions / Restrictions Precautions Precautions: Fall Restrictions Weight Bearing Restrictions: No      Mobility  Bed Mobility Overal bed mobility: Modified Independent             General bed mobility comments: use of rails  Transfers Overall transfer level: Needs assistance   Transfers: Sit to/from Stand Sit to Stand: Min assist         General transfer comment: unsteady with initial standing balance  Ambulation/Gait Ambulation/Gait assistance: Min assist Gait Distance (Feet): 120 Feet Assistive device: 1 person hand held assist Gait Pattern/deviations: Narrow base of support;Decreased stride length;Drifts right/left;Step-through pattern Gait velocity: decreased   General Gait Details: patient is unsteady with ambulation. Hand held assist and holding to rail in hallway for support. Patient would benefit from use of RW.  Stairs            Wheelchair Mobility     Modified Rankin (Stroke Patients Only)       Balance Overall balance assessment: Needs assistance Sitting-balance support: Feet supported Sitting balance-Leahy Scale: Good     Standing balance support: Bilateral upper extremity supported;During functional activity Standing balance-Leahy Scale: Fair Standing balance comment: unsteady with dynamic activity                             Pertinent Vitals/Pain Pain Assessment: No/denies pain    Home Living Family/patient expects to be discharged to:: Private residence       Home Access: Stairs to enter   [employer] of Steps: 3 Home Layout: One level   Additional Comments: Patient is confused and unsure of information given. She states she lives with her husband, does not use AD at baseline.    Prior Function Level of Independence: Independent         Comments: patient states she drives     Hand Dominance        Extremity/Trunk Assessment   Upper Extremity Assessment Upper Extremity Assessment: Defer to OT evaluation    Lower Extremity Assessment Lower Extremity Assessment: Generalized weakness    Cervical / Trunk Assessment Cervical / Trunk Assessment: Normal  Communication   Communication: No difficulties  Cognition Arousal/Alertness: Awake/alert Behavior During Therapy: WFL for tasks assessed/performed Overall Cognitive Status: No family/caregiver present to determine baseline cognitive functioning  General Comments: not oriented to place, time, situation      General Comments      Exercises     Assessment/Plan    PT Assessment Patient needs continued PT services  PT Problem List Decreased strength;Decreased mobility;Decreased safety awareness;Decreased activity tolerance;Decreased balance;Decreased cognition       PT Treatment Interventions DME instruction;Therapeutic activities;Gait training;Therapeutic  exercise;Functional mobility training;Balance training;Neuromuscular re-education;Patient/family education    PT Goals (Current goals can be found in the Care Plan section)  Acute Rehab PT Goals Patient Stated Goal: to go home PT Goal Formulation: With patient Time For Goal Achievement: 02/07/19 Potential to Achieve Goals: Fair    Frequency Min 2X/week   Barriers to discharge   unsure about home envirmonment    Co-evaluation               AM-PAC PT "6 Clicks" Mobility  Outcome Measure Help needed turning from your back to your side while in a flat bed without using bedrails?: None Help needed moving from lying on your back to sitting on the side of a flat bed without using bedrails?: None Help needed moving to and from a bed to a chair (including a wheelchair)?: A Little Help needed standing up from a chair using your arms (e.g., wheelchair or bedside chair)?: A Little Help needed to walk in hospital room?: A Little Help needed climbing 3-5 steps with a railing? : A Lot 6 Click Score: 19    End of Session Equipment Utilized During Treatment: Gait belt Activity Tolerance: Patient limited by fatigue Patient left: in bed;with bed alarm set;with call bell/phone within reach Nurse Communication: Mobility status PT Visit Diagnosis: Unsteadiness on feet (R26.81);Muscle weakness (generalized) (M62.81);Difficulty in walking, not elsewhere classified (R26.2)    Time: 1000-1023 PT Time Calculation (min) (ACUTE ONLY): 23 min   Charges:   PT Evaluation $PT Eval Moderate Complexity: 1 Mod PT Treatments $Gait Training: 8-22 mins        Sharleen Szczesny, PT, GCS 01/24/19,10:36 AM

## 2019-01-24 NOTE — Progress Notes (Signed)
[facility]    SUBJECTIVE: Severe dementia but patient denies any significant pain shortness of breath palpitations or tachycardia right groin also appears to be doing reasonably well PAD pad removed patient denies any significant pain but still has significant ecchymosis   Vitals:   01/23/19 1547 01/23/19 1920 01/24/19 0556 01/24/19 0927  BP: (!) 149/69 (!) 142/66 (!) 149/59 (!) 118/52  Pulse: 88 95 86 85  Resp: 19 20 18 14   Temp: 97.8 F (36.6 C) 98.2 F (36.8 C) 98.2 F (36.8 C) (!) 97.5 F (36.4 C)  TempSrc: Oral Oral Oral Oral  SpO2: 94% 95% 97% 97%  Weight:   73.6 kg   Height:         Intake/Output Summary (Last 24 hours) at 01/24/2019 1156 Last data filed at 01/24/2019 0900 Gross per 24 hour  Intake 120 ml  Output 950 ml  Net -830 ml      PHYSICAL EXAM  General: Well developed, well nourished, in no acute distress HEENT:  Normocephalic and atramatic Neck:  No JVD.  Lungs: Clear bilaterally to auscultation and percussion. Heart: HRRR . Normal S1 and S2 without gallops or murmurs.  Abdomen: Bowel sounds are positive, abdomen soft and non-tender  Msk:  Back normal, normal gait. Normal strength and tone for age. Extremities: No clubbing, cyanosis or edema.   Neuro: Alert and oriented X 3. Psych:  Good affect, responds appropriately   LABS: Basic Metabolic Panel: Recent Labs    01/22/19 0548 01/23/19 0619  NA 139 138  K 4.2 3.6  CL 105 105  CO2 24 23  GLUCOSE 107* 124*  BUN 24* 19  CREATININE 1.11* 1.00  CALCIUM 8.4* 8.6*  MG 2.0  --   PHOS 4.2  --    Liver Function Tests: Recent Labs    01/21/19 1726  AST 43*  ALT 16  ALKPHOS 77  BILITOT 0.8  PROT 7.5  ALBUMIN 3.7   No results for input(s): LIPASE, AMYLASE in the last 72 hours. CBC: Recent Labs    01/22/19 0548 01/23/19 0619  WBC 12.4* 12.9*  NEUTROABS 9.4* 9.9*  HGB 11.8* 11.4*  HCT 36.8 35.3*  MCV 86.2 86.5  PLT 264 246   Cardiac Enzymes: No results for input(s):  CKTOTAL, CKMB, CKMBINDEX, TROPONINI in the last 72 hours. BNP: Invalid input(s): POCBNP D-Dimer: No results for input(s): DDIMER in the last 72 hours. Hemoglobin A1C: No results for input(s): HGBA1C in the last 72 hours. Fasting Lipid Panel: No results for input(s): CHOL, HDL, LDLCALC, TRIG, CHOLHDL, LDLDIRECT in the last 72 hours. Thyroid Function Tests: No results for input(s): TSH, T4TOTAL, T3FREE, THYROIDAB in the last 72 hours.  Invalid input(s): FREET3 Anemia Panel: No results for input(s): VITAMINB12, FOLATE, FERRITIN, TIBC, IRON, RETICCTPCT in the last 72 hours.  CT ABDOMEN PELVIS WO CONTRAST  Result Date: 01/22/2019 CLINICAL DATA:  Hypotension with decreasing hemoglobin. Evaluate for retroperitoneal hematoma. EXAM: CT ABDOMEN AND PELVIS WITHOUT CONTRAST TECHNIQUE: Multidetector CT imaging of the abdomen and pelvis was performed following the standard protocol without IV contrast. COMPARISON:  None. FINDINGS: Lower chest: Minimal basilar atelectasis. Coronary artery calcification is evident. Hepatobiliary: No focal abnormality in the liver on this study without intravenous contrast. Gallbladder is surgically absent. No intrahepatic or extrahepatic biliary dilation. Pancreas: No focal mass lesion. No dilatation of the main duct. No intraparenchymal cyst. No peripancreatic edema. Spleen: No splenomegaly. No focal mass lesion. Adrenals/Urinary Tract: No adrenal nodule or mass. 2.3 cm lesion posterior interpolar right kidney  has attenuation slightly higher than would be expected for a simple cyst. Left kidney unremarkable. Both kidneys excreting contrast, consistent with cardiac catheterization yesterday. No evidence for hydroureter. Urinary bladder decompressed by a Foley catheter. Stomach/Bowel: Small hiatal hernia. Stomach otherwise unremarkable. Duodenum is normally positioned as is the ligament of Treitz. No small bowel wall thickening. No small bowel dilatation. The terminal ileum is  normal. The appendix is normal. No gross colonic mass. No colonic wall thickening. Diverticular changes are noted in the left colon without evidence of diverticulitis. Vascular/Lymphatic: There is abdominal aortic atherosclerosis without aneurysm. There is no gastrohepatic or hepatoduodenal ligament lymphadenopathy. No retroperitoneal or mesenteric lymphadenopathy. No pelvic sidewall lymphadenopathy. Reproductive: The uterus is unremarkable.  There is no adnexal mass. Other: No intraperitoneal free fluid. Musculoskeletal: There is some hemorrhage in the right groin region compatible with recent cardiac catheterization. No focal hematoma in the right groin. There is no extension of hemorrhage or evidence of hematoma into the right pelvic sidewall or retroperitoneal tissues of the abdomen/pelvis. IMPRESSION: 1. No retroperitoneal hematoma. There is some mild hemorrhage in the right groin, compatible with vascular access for yesterday's cardiac procedure. No focal hematoma in the right groin or extension of hemorrhage up into the right pelvic sidewall. 2. 2.3 cm interpolar right renal lesion with attenuation slightly higher than would be expected for a simple cyst. This is likely a cyst complicated by proteinaceous debris or hemorrhage. MRI could provide more definitive characterization as clinically warranted. Follow-up ultrasound in 3 months could be used to ensure stability. 3. Small hiatal hernia. 4.  Aortic Atherosclerois (ICD10-170.0) Electronically Signed   By: Misty Stanley M.D.   On: 01/22/2019 13:31     Echo preserved global left ventricular function  TELEMETRY: Normal sinus rhythm nonspecific ST-T wave changes  ASSESSMENT AND PLAN:  Active Problems:   Dementia without behavioral disturbance (HCC)   STEMI involving right coronary artery (HCC)   Acute ST elevation myocardial infarction (STEMI) of inferior wall (HCC)    1.  Status post STEMI inferior wall PCI and stent  DES Hypertension Hyperlipidemia Diabetes Dementia Moderate right groin hematoma resolving . Plan Patient is done reasonably well okay to discharge home today Increase activity ambulate Right groin moderate hematoma resolving with ecchymosis in the area  Medications on discharge Aspirin 81 mg once a day Plavix 75 mg once a day Lipitor 80 mg once a day Lisinopril 20 mg once a day Metoprolol succinate 25 mg daily Zetia 10 mg once a day Aricept 10 mg once a day Levothyroxine 75 mcg once a day  Patient follow-up with cardiology 1 to 2 weeks    Yolonda Kida, MD, [facility] 01/24/2019 11:56 AM

## 2019-01-25 NOTE — Telephone Encounter (Signed)
Transition Care Management Follow-up Telephone Call   Date discharged? 01/24/19   How have you been since you were released from the hospital? Information received from husband, Bruce (HIPAA compliant). Mental confusion after waking then gets a little better, appetite has decreased but eating, fluid intake is good. Denies chest pain/heaviness. BM/voiding without issues.    Do you understand why you were in the hospital? Husband has reminded patient she had a heart attack.    Do you understand the discharge instructions? Yes, increase activity slowly.    Where were you discharged to? Home.    Items Reviewed:  Medications reviewed: Yes, husband manages the medications. Stop taking amlodipine 5mg , replace with aspirin 81 mg.   Allergies reviewed: Yes, none new.   Dietary changes reviewed: Yes  Referrals reviewed: HHPT to start 01/29/19. Cardiology 02/03/19.    Functional Questionnaire:   Activities of Daily Living (ADLs):   She states they are independent in the following: Self feeding, toileting, grooming.  States they require assistance with the following: Ambulates with walker. Shower seat when bathing,dressing, meal prep assisted by husband.     Any transportation issues/concerns?: None at this time.    Any patient concerns? [place] assistance with home care.    Confirmed importance and date/time of follow-up visits scheduled Yes, tentatively scheduled 02/01/19 @ 1230 via doxy. Will follow to confirm.   Provider Appointment booked with Dr. Nicki Reaper, pcp.   Confirmed with patient if condition begins to worsen call PCP or go to the ER.  Patient was given the office number and encouraged to call back with question or concerns.  : Yes.

## 2019-01-25 NOTE — Telephone Encounter (Signed)
PT husband called returning your call

## 2019-01-31 ENCOUNTER — Encounter: Payer: Self-pay | Admitting: Internal Medicine

## 2019-01-31 ENCOUNTER — Ambulatory Visit (INDEPENDENT_AMBULATORY_CARE_PROVIDER_SITE_OTHER): Payer: Medicare Other | Admitting: Internal Medicine

## 2019-01-31 ENCOUNTER — Other Ambulatory Visit: Payer: Self-pay

## 2019-01-31 VITALS — Ht 63.0 in | Wt 153.0 lb

## 2019-01-31 DIAGNOSIS — E039 Hypothyroidism, unspecified: Secondary | ICD-10-CM

## 2019-01-31 DIAGNOSIS — E7800 Pure hypercholesterolemia, unspecified: Secondary | ICD-10-CM

## 2019-01-31 DIAGNOSIS — D649 Anemia, unspecified: Secondary | ICD-10-CM | POA: Diagnosis not present

## 2019-01-31 DIAGNOSIS — N281 Cyst of kidney, acquired: Secondary | ICD-10-CM

## 2019-01-31 DIAGNOSIS — I10 Essential (primary) hypertension: Secondary | ICD-10-CM

## 2019-01-31 DIAGNOSIS — I2111 ST elevation (STEMI) myocardial infarction involving right coronary artery: Secondary | ICD-10-CM

## 2019-01-31 DIAGNOSIS — F0390 Unspecified dementia without behavioral disturbance: Secondary | ICD-10-CM | POA: Diagnosis not present

## 2019-01-31 DIAGNOSIS — E1159 Type 2 diabetes mellitus with other circulatory complications: Secondary | ICD-10-CM | POA: Diagnosis not present

## 2019-01-31 DIAGNOSIS — I2510 Atherosclerotic heart disease of native coronary artery without angina pectoris: Secondary | ICD-10-CM | POA: Diagnosis not present

## 2019-01-31 NOTE — Progress Notes (Addendum)
Patient ID: Amanda [name], female   DOB: [birth year], 86 y.o.   MRN: [id number]   Virtual Visit via video Note  This visit type was conducted due to national recommendations for restrictions regarding the COVID-19 pandemic (e.g. social distancing).  This format is felt to be most appropriate for this patient at this time.  All issues noted in this document were discussed and addressed.  No physical exam was performed (except for noted visual exam findings with Video Visits).   I connected with Amanda [name] by a video enabled telemedicine application and verified that I am speaking with the correct person using two identifiers. Location patient: home Location provider: work  Persons participating in the virtual visit: patient, provider, pts husband and pts grandson Marketing executive)  The limitations, risks, security and privacy concerns of performing an evaluation and management service by telephone and the availability of in person appointments have been discussed.  Expressed understanding and agreed to proceed.  Reason for visit: hospital follow up.   HPI: Admitted 01/21/19 - 01/24/19.  Presented to ER 01/21/19 with diaphoresis.  EKG obtained by EMS revealed ST elevation inferiorly with reciprocal depressions laterally.  Admitted with code STEMI.  Cath - 95% stenosis of proximal RCA and is s/p DES to RCA.  H&H dropped mildly.  CT abdomen - no retroperitoneal bleed.  Home physical therapy recommended.  Amlodipine was discontinued and she was started on lisinopril.  Was started on lipitor.  Had to stop secondary to intolerance.  Discharged with plans for home PT and f/u with cardiology this week.  She reports since being home, she has done well.  No chest pain.  No sob.  Eating.  No nausea or vomiting.  No abdominal pain.  Discussed CT scan.  Also revealed renal cyst.  Discussed further f/up.  Since stopping lipitor, husband reports - doing better.     ROS: See pertinent positives and negatives per  HPI.  Past Medical History:  Diagnosis Date  . Carotid bruit    bilaterally  . Dementia ([place])   . Hypercholesterolemia   . Hypertension   . Hypothyroidism     Past Surgical History:  Procedure Laterality Date  . CARPAL TUNNEL RELEASE    . CHOLECYSTECTOMY  1991  . CORONARY/GRAFT ACUTE MI REVASCULARIZATION N/A 01/21/2019   Procedure: Coronary/Graft Acute MI Revascularization;  Surgeon: Yolonda Kida, MD;  Location: [facility];  Service: Cardiovascular;  Laterality: N/A;  . LEFT HEART CATH AND CORONARY ANGIOGRAPHY N/A 01/21/2019   Procedure: LEFT HEART CATH AND CORONARY ANGIOGRAPHY;  Surgeon: Yolonda Kida, MD;  Location: [facility];  Service: Cardiovascular;  Laterality: N/A;  . THORACIC AORTA STENT  12/13   heart stent    Family History  Problem Relation Age of Onset  . Hypertension Father   . Lung cancer Father   . Hypertension Mother   . Dementia Mother   . Arthritis Other   . Breast cancer Cousin        mat cousin  . Colon cancer Neg Hx     SOCIAL HX: reviewed.    Current Outpatient Medications:  .  aspirin 81 MG chewable tablet, Chew 1 tablet (81 mg total) by mouth daily., Disp: 30 tablet, Rfl: 0 .  atorvastatin (LIPITOR) 40 MG tablet, Take 1 tablet (40 mg total) by mouth daily at 6 PM., Disp: 30 tablet, Rfl: 0 .  clopidogrel (PLAVIX) 75 MG tablet, Take 1 tablet (75 mg total) by mouth daily., Disp: 30  tablet, Rfl: 0 .  clopidogrel (PLAVIX) 75 MG tablet, Take 1 tablet (75 mg total) by mouth daily with breakfast., Disp: 90 tablet, Rfl: 1 .  donepezil (ARICEPT) 10 MG tablet, Take 1 tablet (10 mg total) by mouth at bedtime., Disp: 90 tablet, Rfl: 3 .  ezetimibe (ZETIA) 10 MG tablet, Take 1 tablet (10 mg total) by mouth daily. (Patient not taking: Reported on 01/19/2019), Disp: 90 tablet, Rfl: 1 .  levothyroxine (SYNTHROID) 75 MCG tablet, 1/2 tablet q day (Patient taking differently: Take 37.5 mcg by mouth daily before breakfast. 1/2 tablet q  day), Disp: 90 tablet, Rfl: 3 .  lisinopril (ZESTRIL) 20 MG tablet, Take 1 tablet (20 mg total) by mouth daily., Disp: 90 tablet, Rfl: 0 .  metoprolol tartrate (LOPRESSOR) 25 MG tablet, Take 1 tablet (25 mg total) by mouth 2 (two) times daily., Disp: 180 tablet, Rfl: 1  EXAM:  GENERAL: alert, oriented, appears well and in no acute distress  HEENT: atraumatic, conjunttiva clear, no obvious abnormalities on inspection of external nose and ears  NECK: normal movements of the head and neck  LUNGS: on inspection no signs of respiratory distress, breathing rate appears normal, no obvious gross SOB, gasping or wheezing  CV: no obvious cyanosis  PSYCH/NEURO: pleasant and cooperative, no obvious depression or anxiety, speech and thought processing grossly intact  ASSESSMENT AND PLAN:  Discussed the following assessment and plan:  CAD (coronary artery disease) Known CAD.  Recent admission.  Cath as outlined.  S/p DES - RCA.  Continue plavix.  Continue risk factor modification.  Unable to tolerate statin medication.  Keep appt with cardiology.    Diabetes mellitus with circulatory complication (HCC) Low carb diet and exercise.  Follow met b and a1c.   Dementia without behavioral disturbance (HCC) On aricept.  Continue.    Hypercholesteremia Intolerant to statin medication.  Was discharged on lipitor.  Did not tolerate.  Off now and feels better.  Follow lipid panel.    Hypertension, essential Amlodipine stopped in hospital.  On lisinopril.  Have them spot check her pressures.  Follow metabolic panel.    Hypothyroidism On thyroid replacement.  Follow tsh.    STEMI involving right coronary artery [facility]) Recently admitted with STEMI.  Cath - s/p DES - RCA.  Continue risk factor modification.  Unable to take statin medication.  Keep f/u with cardiology this week.  Currently without chest pain.    Renal cyst Found on CT (incidental finding).  Plan for f/u renal ultrasound in 3 months.     Anemia Slight decrease hgb in hospital.  CT as outlined.  Recheck cbc within the next week.     Orders Placed This Encounter  Procedures  . CBC with Differential/Platelet    Standing Status:   Future    Standing Expiration Date:   02/01/2020  . Basic metabolic panel (future)    Standing Status:   Future    Standing Expiration Date:   02/01/2020    Meds ordered this encounter  Medications  . lisinopril (ZESTRIL) 20 MG tablet    Sig: Take 1 tablet (20 mg total) by mouth daily.    Dispense:  90 tablet    Refill:  0  . metoprolol tartrate (LOPRESSOR) 25 MG tablet    Sig: Take 1 tablet (25 mg total) by mouth 2 (two) times daily.    Dispense:  180 tablet    Refill:  1  . clopidogrel (PLAVIX) 75 MG tablet    Sig: Take  1 tablet (75 mg total) by mouth daily with breakfast.    Dispense:  90 tablet    Refill:  1     I discussed the assessment and treatment plan with the patient. The patient was provided an opportunity to ask questions and all were answered. The patient agreed with the plan and demonstrated an understanding of the instructions.   The patient was advised to call back or seek an in-person evaluation if the symptoms worsen or if the condition fails to improve as anticipated.   Einar Pheasant, MD

## 2019-02-01 ENCOUNTER — Ambulatory Visit: Payer: Medicare Other | Admitting: Internal Medicine

## 2019-02-01 ENCOUNTER — Telehealth: Payer: Self-pay | Admitting: Internal Medicine

## 2019-02-01 ENCOUNTER — Encounter: Payer: Self-pay | Admitting: Internal Medicine

## 2019-02-01 DIAGNOSIS — D649 Anemia, unspecified: Secondary | ICD-10-CM | POA: Insufficient documentation

## 2019-02-01 DIAGNOSIS — N281 Cyst of kidney, acquired: Secondary | ICD-10-CM | POA: Insufficient documentation

## 2019-02-01 MED ORDER — LISINOPRIL 20 MG PO TABS: 20.0000 mg | ORAL_TABLET | Freq: Every day | ORAL | 0 refills | Status: DC

## 2019-02-01 MED ORDER — CLOPIDOGREL BISULFATE 75 MG PO TABS: 75.0000 mg | ORAL_TABLET | Freq: Every day | ORAL | 1 refills | Status: DC

## 2019-02-01 MED ORDER — METOPROLOL TARTRATE 25 MG PO TABS: 25.0000 mg | ORAL_TABLET | Freq: Two times a day (BID) | ORAL | 1 refills | Status: DC

## 2019-02-01 NOTE — Assessment & Plan Note (Signed)
Recently admitted with STEMI.  Cath - s/p DES - RCA.  Continue risk factor modification.  Unable to take statin medication.  Keep f/u with cardiology this week.  Currently without chest pain.

## 2019-02-01 NOTE — Assessment & Plan Note (Signed)
Low carb diet and exercise.  Follow met b and a1c.  

## 2019-02-01 NOTE — Assessment & Plan Note (Signed)
On thyroid replacement.  Follow tsh.  

## 2019-02-01 NOTE — Assessment & Plan Note (Signed)
Found on CT (incidental finding).  Plan for f/u renal ultrasound in 3 months.

## 2019-02-01 NOTE — Assessment & Plan Note (Signed)
Intolerant to statin medication.  Was discharged on lipitor.  Did not tolerate.  Off now and feels better.  Follow lipid panel.

## 2019-02-01 NOTE — Assessment & Plan Note (Signed)
Known CAD.  Recent admission.  Cath as outlined.  S/p DES - RCA.  Continue plavix.  Continue risk factor modification.  Unable to tolerate statin medication.  Keep appt with cardiology.

## 2019-02-01 NOTE — Assessment & Plan Note (Signed)
On aricept.  Continue.

## 2019-02-01 NOTE — Telephone Encounter (Signed)
Lm to schedule non fasting labs within the next week

## 2019-02-01 NOTE — Addendum Note (Signed)
Addended by: Charm Barges on: 02/01/2019 03:27 AM   Modules accepted: Orders

## 2019-02-01 NOTE — Assessment & Plan Note (Signed)
Amlodipine stopped in hospital.  On lisinopril.  Have them spot check her pressures.  Follow metabolic panel.

## 2019-02-01 NOTE — Assessment & Plan Note (Signed)
Slight decrease hgb in hospital.  CT as outlined.  Recheck cbc within the next week.

## 2019-02-03 DIAGNOSIS — I10 Essential (primary) hypertension: Secondary | ICD-10-CM | POA: Diagnosis not present

## 2019-02-03 DIAGNOSIS — E782 Mixed hyperlipidemia: Secondary | ICD-10-CM | POA: Diagnosis not present

## 2019-02-03 DIAGNOSIS — I2510 Atherosclerotic heart disease of native coronary artery without angina pectoris: Secondary | ICD-10-CM | POA: Diagnosis not present

## 2019-02-03 DIAGNOSIS — I739 Peripheral vascular disease, unspecified: Secondary | ICD-10-CM | POA: Diagnosis not present

## 2019-02-04 ENCOUNTER — Telehealth: Payer: Self-pay | Admitting: Internal Medicine

## 2019-02-04 DIAGNOSIS — F0280 Dementia in other diseases classified elsewhere without behavioral disturbance: Secondary | ICD-10-CM | POA: Diagnosis not present

## 2019-02-04 DIAGNOSIS — I2111 ST elevation (STEMI) myocardial infarction involving right coronary artery: Secondary | ICD-10-CM | POA: Diagnosis not present

## 2019-02-04 DIAGNOSIS — Z955 Presence of coronary angioplasty implant and graft: Secondary | ICD-10-CM | POA: Diagnosis not present

## 2019-02-04 DIAGNOSIS — Z48812 Encounter for surgical aftercare following surgery on the circulatory system: Secondary | ICD-10-CM | POA: Diagnosis not present

## 2019-02-04 DIAGNOSIS — G309 Alzheimer's disease, unspecified: Secondary | ICD-10-CM | POA: Diagnosis not present

## 2019-02-04 DIAGNOSIS — I10 Essential (primary) hypertension: Secondary | ICD-10-CM | POA: Diagnosis not present

## 2019-02-04 DIAGNOSIS — E1159 Type 2 diabetes mellitus with other circulatory complications: Secondary | ICD-10-CM | POA: Diagnosis not present

## 2019-02-04 DIAGNOSIS — R2681 Unsteadiness on feet: Secondary | ICD-10-CM | POA: Diagnosis not present

## 2019-02-04 DIAGNOSIS — I25110 Atherosclerotic heart disease of native coronary artery with unstable angina pectoris: Secondary | ICD-10-CM | POA: Diagnosis not present

## 2019-02-04 NOTE — Telephone Encounter (Signed)
Verbals given to [place]

## 2019-02-04 NOTE — Telephone Encounter (Signed)
Amanda [name] with [facility] home [facility] called to get verbal orders on physical therapy. His number is [phone]

## 2019-02-04 NOTE — Telephone Encounter (Signed)
Left message for Amanda [name] to call back. VM did not stated who he was by name so verbals were not left on VM. OK to give verbals if he calls back

## 2019-02-09 DIAGNOSIS — R2681 Unsteadiness on feet: Secondary | ICD-10-CM | POA: Diagnosis not present

## 2019-02-09 DIAGNOSIS — Z955 Presence of coronary angioplasty implant and graft: Secondary | ICD-10-CM | POA: Diagnosis not present

## 2019-02-09 DIAGNOSIS — Z48812 Encounter for surgical aftercare following surgery on the circulatory system: Secondary | ICD-10-CM | POA: Diagnosis not present

## 2019-02-09 DIAGNOSIS — I2111 ST elevation (STEMI) myocardial infarction involving right coronary artery: Secondary | ICD-10-CM | POA: Diagnosis not present

## 2019-02-09 DIAGNOSIS — E1159 Type 2 diabetes mellitus with other circulatory complications: Secondary | ICD-10-CM | POA: Diagnosis not present

## 2019-02-09 DIAGNOSIS — I25110 Atherosclerotic heart disease of native coronary artery with unstable angina pectoris: Secondary | ICD-10-CM | POA: Diagnosis not present

## 2019-02-09 DIAGNOSIS — G309 Alzheimer's disease, unspecified: Secondary | ICD-10-CM | POA: Diagnosis not present

## 2019-02-09 DIAGNOSIS — F0280 Dementia in other diseases classified elsewhere without behavioral disturbance: Secondary | ICD-10-CM | POA: Diagnosis not present

## 2019-02-09 DIAGNOSIS — I10 Essential (primary) hypertension: Secondary | ICD-10-CM | POA: Diagnosis not present

## 2019-02-11 DIAGNOSIS — Z955 Presence of coronary angioplasty implant and graft: Secondary | ICD-10-CM | POA: Diagnosis not present

## 2019-02-11 DIAGNOSIS — R2681 Unsteadiness on feet: Secondary | ICD-10-CM | POA: Diagnosis not present

## 2019-02-11 DIAGNOSIS — F0280 Dementia in other diseases classified elsewhere without behavioral disturbance: Secondary | ICD-10-CM | POA: Diagnosis not present

## 2019-02-11 DIAGNOSIS — I10 Essential (primary) hypertension: Secondary | ICD-10-CM | POA: Diagnosis not present

## 2019-02-11 DIAGNOSIS — I25110 Atherosclerotic heart disease of native coronary artery with unstable angina pectoris: Secondary | ICD-10-CM | POA: Diagnosis not present

## 2019-02-11 DIAGNOSIS — I2111 ST elevation (STEMI) myocardial infarction involving right coronary artery: Secondary | ICD-10-CM | POA: Diagnosis not present

## 2019-02-11 DIAGNOSIS — G309 Alzheimer's disease, unspecified: Secondary | ICD-10-CM | POA: Diagnosis not present

## 2019-02-11 DIAGNOSIS — E1159 Type 2 diabetes mellitus with other circulatory complications: Secondary | ICD-10-CM | POA: Diagnosis not present

## 2019-02-11 DIAGNOSIS — Z48812 Encounter for surgical aftercare following surgery on the circulatory system: Secondary | ICD-10-CM | POA: Diagnosis not present

## 2019-02-14 DIAGNOSIS — F0280 Dementia in other diseases classified elsewhere without behavioral disturbance: Secondary | ICD-10-CM | POA: Diagnosis not present

## 2019-02-14 DIAGNOSIS — I25110 Atherosclerotic heart disease of native coronary artery with unstable angina pectoris: Secondary | ICD-10-CM | POA: Diagnosis not present

## 2019-02-14 DIAGNOSIS — R2681 Unsteadiness on feet: Secondary | ICD-10-CM | POA: Diagnosis not present

## 2019-02-14 DIAGNOSIS — I2111 ST elevation (STEMI) myocardial infarction involving right coronary artery: Secondary | ICD-10-CM | POA: Diagnosis not present

## 2019-02-14 DIAGNOSIS — I10 Essential (primary) hypertension: Secondary | ICD-10-CM | POA: Diagnosis not present

## 2019-02-14 DIAGNOSIS — Z48812 Encounter for surgical aftercare following surgery on the circulatory system: Secondary | ICD-10-CM | POA: Diagnosis not present

## 2019-02-14 DIAGNOSIS — Z955 Presence of coronary angioplasty implant and graft: Secondary | ICD-10-CM | POA: Diagnosis not present

## 2019-02-14 DIAGNOSIS — G309 Alzheimer's disease, unspecified: Secondary | ICD-10-CM | POA: Diagnosis not present

## 2019-02-14 DIAGNOSIS — E1159 Type 2 diabetes mellitus with other circulatory complications: Secondary | ICD-10-CM | POA: Diagnosis not present

## 2019-02-17 DIAGNOSIS — I25110 Atherosclerotic heart disease of native coronary artery with unstable angina pectoris: Secondary | ICD-10-CM | POA: Diagnosis not present

## 2019-02-17 DIAGNOSIS — Z48812 Encounter for surgical aftercare following surgery on the circulatory system: Secondary | ICD-10-CM | POA: Diagnosis not present

## 2019-02-17 DIAGNOSIS — E1159 Type 2 diabetes mellitus with other circulatory complications: Secondary | ICD-10-CM | POA: Diagnosis not present

## 2019-02-17 DIAGNOSIS — Z955 Presence of coronary angioplasty implant and graft: Secondary | ICD-10-CM | POA: Diagnosis not present

## 2019-02-17 DIAGNOSIS — I2111 ST elevation (STEMI) myocardial infarction involving right coronary artery: Secondary | ICD-10-CM | POA: Diagnosis not present

## 2019-02-17 DIAGNOSIS — F0280 Dementia in other diseases classified elsewhere without behavioral disturbance: Secondary | ICD-10-CM | POA: Diagnosis not present

## 2019-02-17 DIAGNOSIS — I10 Essential (primary) hypertension: Secondary | ICD-10-CM | POA: Diagnosis not present

## 2019-02-17 DIAGNOSIS — G309 Alzheimer's disease, unspecified: Secondary | ICD-10-CM | POA: Diagnosis not present

## 2019-02-17 DIAGNOSIS — R2681 Unsteadiness on feet: Secondary | ICD-10-CM | POA: Diagnosis not present

## 2019-02-18 DIAGNOSIS — Z48812 Encounter for surgical aftercare following surgery on the circulatory system: Secondary | ICD-10-CM | POA: Diagnosis not present

## 2019-02-18 DIAGNOSIS — E1159 Type 2 diabetes mellitus with other circulatory complications: Secondary | ICD-10-CM | POA: Diagnosis not present

## 2019-02-18 DIAGNOSIS — I10 Essential (primary) hypertension: Secondary | ICD-10-CM | POA: Diagnosis not present

## 2019-02-18 DIAGNOSIS — G309 Alzheimer's disease, unspecified: Secondary | ICD-10-CM | POA: Diagnosis not present

## 2019-02-18 DIAGNOSIS — I2111 ST elevation (STEMI) myocardial infarction involving right coronary artery: Secondary | ICD-10-CM | POA: Diagnosis not present

## 2019-02-18 DIAGNOSIS — Z955 Presence of coronary angioplasty implant and graft: Secondary | ICD-10-CM | POA: Diagnosis not present

## 2019-02-18 DIAGNOSIS — F0280 Dementia in other diseases classified elsewhere without behavioral disturbance: Secondary | ICD-10-CM | POA: Diagnosis not present

## 2019-02-18 DIAGNOSIS — I25110 Atherosclerotic heart disease of native coronary artery with unstable angina pectoris: Secondary | ICD-10-CM | POA: Diagnosis not present

## 2019-02-18 DIAGNOSIS — R2681 Unsteadiness on feet: Secondary | ICD-10-CM | POA: Diagnosis not present

## 2019-02-22 DIAGNOSIS — F0280 Dementia in other diseases classified elsewhere without behavioral disturbance: Secondary | ICD-10-CM | POA: Diagnosis not present

## 2019-02-22 DIAGNOSIS — Z48812 Encounter for surgical aftercare following surgery on the circulatory system: Secondary | ICD-10-CM | POA: Diagnosis not present

## 2019-02-22 DIAGNOSIS — I25110 Atherosclerotic heart disease of native coronary artery with unstable angina pectoris: Secondary | ICD-10-CM | POA: Diagnosis not present

## 2019-02-22 DIAGNOSIS — G309 Alzheimer's disease, unspecified: Secondary | ICD-10-CM | POA: Diagnosis not present

## 2019-02-22 DIAGNOSIS — Z955 Presence of coronary angioplasty implant and graft: Secondary | ICD-10-CM | POA: Diagnosis not present

## 2019-02-22 DIAGNOSIS — I10 Essential (primary) hypertension: Secondary | ICD-10-CM | POA: Diagnosis not present

## 2019-02-22 DIAGNOSIS — E1159 Type 2 diabetes mellitus with other circulatory complications: Secondary | ICD-10-CM | POA: Diagnosis not present

## 2019-02-22 DIAGNOSIS — I2111 ST elevation (STEMI) myocardial infarction involving right coronary artery: Secondary | ICD-10-CM | POA: Diagnosis not present

## 2019-02-22 DIAGNOSIS — R2681 Unsteadiness on feet: Secondary | ICD-10-CM | POA: Diagnosis not present

## 2019-03-09 ENCOUNTER — Other Ambulatory Visit: Payer: Self-pay | Admitting: Internal Medicine

## 2019-03-11 DIAGNOSIS — G309 Alzheimer's disease, unspecified: Secondary | ICD-10-CM | POA: Diagnosis not present

## 2019-03-11 DIAGNOSIS — R2681 Unsteadiness on feet: Secondary | ICD-10-CM | POA: Diagnosis not present

## 2019-03-11 DIAGNOSIS — Z955 Presence of coronary angioplasty implant and graft: Secondary | ICD-10-CM | POA: Diagnosis not present

## 2019-03-11 DIAGNOSIS — E1159 Type 2 diabetes mellitus with other circulatory complications: Secondary | ICD-10-CM | POA: Diagnosis not present

## 2019-03-11 DIAGNOSIS — I10 Essential (primary) hypertension: Secondary | ICD-10-CM | POA: Diagnosis not present

## 2019-03-11 DIAGNOSIS — I2111 ST elevation (STEMI) myocardial infarction involving right coronary artery: Secondary | ICD-10-CM | POA: Diagnosis not present

## 2019-03-11 DIAGNOSIS — E039 Hypothyroidism, unspecified: Secondary | ICD-10-CM

## 2019-03-11 DIAGNOSIS — I25110 Atherosclerotic heart disease of native coronary artery with unstable angina pectoris: Secondary | ICD-10-CM | POA: Diagnosis not present

## 2019-03-11 DIAGNOSIS — F0280 Dementia in other diseases classified elsewhere without behavioral disturbance: Secondary | ICD-10-CM | POA: Diagnosis not present

## 2019-03-11 DIAGNOSIS — Z9181 History of falling: Secondary | ICD-10-CM

## 2019-03-11 DIAGNOSIS — Z48812 Encounter for surgical aftercare following surgery on the circulatory system: Secondary | ICD-10-CM | POA: Diagnosis not present

## 2019-03-21 ENCOUNTER — Other Ambulatory Visit: Payer: Self-pay | Admitting: Internal Medicine

## 2019-03-31 ENCOUNTER — Ambulatory Visit: Payer: Medicare PPO | Attending: Internal Medicine

## 2019-03-31 DIAGNOSIS — Z23 Encounter for immunization: Secondary | ICD-10-CM | POA: Insufficient documentation

## 2019-03-31 NOTE — Progress Notes (Signed)
   Covid-19 Vaccination Clinic  Name:  RAMONDA GALYON    MRN: [id number] DOB: [birth year]  03/31/2019  Ms. Aydelott was observed post Covid-19 immunization for 15 minutes without incident. She was provided with Vaccine Information Sheet and instruction to access the V-Safe system.   Ms. Otter was instructed to call 911 with any severe reactions post vaccine: Marland Kitchen Difficulty breathing  . Swelling of face and throat  . A fast heartbeat  . A bad rash all over body  . Dizziness and weakness   Immunizations Administered    Name Date Dose VIS Date Route   Pfizer COVID-19 Vaccine 03/31/2019  9:08 AM 0.3 mL 01/07/2019 Intramuscular   Manufacturer: [employer], [employer]   Lot: [id number]   NDC: [address]-8

## 2019-04-19 ENCOUNTER — Telehealth: Payer: Self-pay | Admitting: Internal Medicine

## 2019-04-19 NOTE — Telephone Encounter (Signed)
Kim with [facility] dropped off plan of care forms. Put in colored folders up front

## 2019-04-20 NOTE — Telephone Encounter (Signed)
Plan of Care form placed in quick sign. Form charge attached.

## 2019-04-21 ENCOUNTER — Ambulatory Visit: Payer: Medicare PPO | Attending: Internal Medicine

## 2019-04-21 DIAGNOSIS — Z23 Encounter for immunization: Secondary | ICD-10-CM

## 2019-04-21 NOTE — Progress Notes (Signed)
   Covid-19 Vaccination Clinic  Name:  Amanda [name]    MRN: [id number] DOB: Mar 04, 1933  04/21/2019  [name] was observed post Covid-19 immunization for 15 minutes without incident. She was provided with Vaccine Information Sheet and instruction to access the V-Safe system.   [name] was instructed to call 911 with any severe reactions post vaccine: Marland Kitchen Difficulty breathing  . Swelling of face and throat  . A fast heartbeat  . A bad rash all over body  . Dizziness and weakness   Immunizations Administered    Name Date Dose VIS Date Route   Pfizer COVID-19 Vaccine 04/21/2019  8:44 AM 0.3 mL 01/07/2019 Intramuscular   Manufacturer: [employer], [employer]   Lot: [id number]   NDC: [address]-1

## 2019-04-21 NOTE — Telephone Encounter (Signed)
Signed form.  Need to clarify dosing of metoprolol.  Also,, is she still getting home health?

## 2019-04-21 NOTE — Telephone Encounter (Signed)
Pt taking metoprolol once daily and is not getting home health anymore.

## 2019-06-01 ENCOUNTER — Encounter: Payer: Self-pay | Admitting: Internal Medicine

## 2019-06-01 ENCOUNTER — Ambulatory Visit: Payer: Medicare Other | Admitting: Internal Medicine

## 2019-06-01 ENCOUNTER — Other Ambulatory Visit: Payer: Self-pay

## 2019-06-01 VITALS — BP 130/70 | HR 59 | Temp 97.5°F | Resp 16 | Ht 63.0 in | Wt 160.0 lb

## 2019-06-01 DIAGNOSIS — N281 Cyst of kidney, acquired: Secondary | ICD-10-CM | POA: Diagnosis not present

## 2019-06-01 DIAGNOSIS — E039 Hypothyroidism, unspecified: Secondary | ICD-10-CM

## 2019-06-01 DIAGNOSIS — D649 Anemia, unspecified: Secondary | ICD-10-CM | POA: Diagnosis not present

## 2019-06-01 DIAGNOSIS — E7800 Pure hypercholesterolemia, unspecified: Secondary | ICD-10-CM | POA: Diagnosis not present

## 2019-06-01 DIAGNOSIS — F0390 Unspecified dementia without behavioral disturbance: Secondary | ICD-10-CM | POA: Diagnosis not present

## 2019-06-01 DIAGNOSIS — I10 Essential (primary) hypertension: Secondary | ICD-10-CM

## 2019-06-01 DIAGNOSIS — I2510 Atherosclerotic heart disease of native coronary artery without angina pectoris: Secondary | ICD-10-CM

## 2019-06-01 DIAGNOSIS — Z0000 Encounter for general adult medical examination without abnormal findings: Secondary | ICD-10-CM

## 2019-06-01 DIAGNOSIS — E1159 Type 2 diabetes mellitus with other circulatory complications: Secondary | ICD-10-CM | POA: Diagnosis not present

## 2019-06-01 LAB — HEPATIC FUNCTION PANEL
ALT: 7 U/L (ref 0–35)
AST: 12 U/L (ref 0–37)
Albumin: 4.2 g/dL (ref 3.5–5.2)
Alkaline Phosphatase: 81 U/L (ref 39–117)
Bilirubin, Direct: 0.1 mg/dL (ref 0.0–0.3)
Total Bilirubin: 0.6 mg/dL (ref 0.2–1.2)
Total Protein: 7.1 g/dL (ref 6.0–8.3)

## 2019-06-01 LAB — CBC WITH DIFFERENTIAL/PLATELET
Basophils Absolute: 0.1 10*3/uL (ref 0.0–0.1)
Basophils Relative: 0.9 % (ref 0.0–3.0)
Eosinophils Absolute: 0.1 10*3/uL (ref 0.0–0.7)
Eosinophils Relative: 1 % (ref 0.0–5.0)
HCT: 44.3 % (ref 36.0–46.0)
Hemoglobin: 14.6 g/dL (ref 12.0–15.0)
Lymphocytes Relative: 22.9 % (ref 12.0–46.0)
Lymphs Abs: 2.3 10*3/uL (ref 0.7–4.0)
MCHC: 33.1 g/dL (ref 30.0–36.0)
MCV: 87.4 fl (ref 78.0–100.0)
Monocytes Absolute: 1 10*3/uL (ref 0.1–1.0)
Monocytes Relative: 9.6 % (ref 3.0–12.0)
Neutro Abs: 6.5 10*3/uL (ref 1.4–7.7)
Neutrophils Relative %: 65.6 % (ref 43.0–77.0)
Platelets: 304 10*3/uL (ref 150.0–400.0)
RBC: 5.07 Mil/uL (ref 3.87–5.11)
RDW: 14.4 % (ref 11.5–15.5)
WBC: 10 10*3/uL (ref 4.0–10.5)

## 2019-06-01 LAB — LIPID PANEL
Cholesterol: 252 mg/dL — ABNORMAL HIGH (ref 0–200)
HDL: 35.6 mg/dL — ABNORMAL LOW (ref >39.00)
NonHDL: 216.79
Total CHOL/HDL Ratio: 7
Triglycerides: 225 mg/dL — ABNORMAL HIGH (ref 0.0–149.0)
VLDL: 45 mg/dL — ABNORMAL HIGH (ref 0.0–40.0)

## 2019-06-01 LAB — IBC + FERRITIN
Ferritin: 26.7 ng/mL (ref 10.0–291.0)
Iron: 70 ug/dL (ref 42–145)
Saturation Ratios: 15.2 % — ABNORMAL LOW (ref 20.0–50.0)
Transferrin: 328 mg/dL (ref 212.0–360.0)

## 2019-06-01 LAB — BASIC METABOLIC PANEL
BUN: 21 mg/dL (ref 6–23)
CO2: 31 mEq/L (ref 19–32)
Calcium: 10.1 mg/dL (ref 8.4–10.5)
Chloride: 101 mEq/L (ref 96–112)
Creatinine, Ser: 1.16 mg/dL (ref 0.40–1.20)
GFR: 44.5 mL/min — ABNORMAL LOW (ref >60.00)
Glucose, Bld: 121 mg/dL — ABNORMAL HIGH (ref 70–99)
Potassium: 4.8 mEq/L (ref 3.5–5.1)
Sodium: 139 mEq/L (ref 135–145)

## 2019-06-01 LAB — LDL CHOLESTEROL, DIRECT: Direct LDL: 177 mg/dL

## 2019-06-01 LAB — VITAMIN B12: Vitamin B-12: 130 pg/mL — ABNORMAL LOW (ref 211–911)

## 2019-06-01 NOTE — Progress Notes (Signed)
Patient ID: Amanda [name], female   DOB: 1933/07/01, 86 y.o.   MRN: [id number]   Subjective:    Patient ID: Amanda [name], female    DOB: April 10, 1933, 86 y.o.   MRN: [id number]  HPI This visit occurred during the SARS-CoV-2 public health emergency.  Safety protocols were in place, including screening questions prior to the visit, additional usage of staff PPE, and extensive cleaning of exam room while observing appropriate contact time as indicated for disinfecting solutions.  Patient here for a scheduled follow up.  She is accompanied by her husband.  History obtained from both of them.  Her to f/u on her blood pressure, elevated cholesterol and dementia.  Reports things are relatively stable.  No chest pain or sob.  No acid reflux.  No abdominal pain.  Bowels moving.  Sees cardiology.  On lopressor.  Has been taking toprol XL 71m - one per day and lopressor 230mbid.  Confusion - when in hospital - taking both now.  Will change to lopressor bid.  Regarding her dementia, husband reports is stable.    Past Medical History:  Diagnosis Date  . Carotid bruit    bilaterally  . [place]  . Hypercholesterolemia   . Hypertension   . Hypothyroidism    Past Surgical History:  Procedure Laterality Date  . CARPAL TUNNEL RELEASE    . CHOLECYSTECTOMY  1991  . CORONARY/GRAFT ACUTE MI REVASCULARIZATION N/A 01/21/2019   Procedure: Coronary/Graft Acute MI Revascularization;  Surgeon: CaYolonda KidaMD;  [facility];  Service: Cardiovascular;  Laterality: N/A;  . LEFT HEART CATH AND CORONARY ANGIOGRAPHY N/A 01/21/2019   Procedure: LEFT HEART CATH AND CORONARY ANGIOGRAPHY;  Surgeon: CaYolonda KidaMD;  [facility];  Service: Cardiovascular;  Laterality: N/A;  . THORACIC AORTA STENT  12/13   heart stent   Family History  Problem Relation Age of Onset  . Hypertension Father   . Lung cancer Father   . Hypertension Mother   . Dementia Mother   .  Arthritis Other   . Breast cancer Cousin        mat cousin  . Colon cancer Neg Hx    Social History   Socioeconomic History  . Marital status: Married    Spouse name: Not on file  . Number of children: 2  . Years of education: Not on file  . Highest education level: Not on file  Occupational History  . Occupation: retired tePharmacist, hospitalTobacco Use  . Smoking status: Never Smoker  . Smokeless tobacco: Never Used  Substance and Sexual Activity  . Alcohol use: No    Alcohol/week: 0.0 standard drinks  . Drug use: No  . Sexual activity: Not on file  Other Topics Concern  . Not on file  Social History Narrative   She is an only child. 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   Social Determinants of Health   Financial Resource Strain:   . Difficulty of Paying Living Expenses:   Food Insecurity:   . Worried About RuCharity fundraisern the Last Year:   . RaArboriculturistn the Last Year:   Transportation Needs:   . LaFilm/video editorMedical):   . Marland Kitchenack of Transportation (Non-Medical):   Physical Activity:   . Days of Exercise per Week:   . Minutes of Exercise per Session:   Stress:   .  Feeling of Stress :   Social Connections:   . Frequency of Communication with Friends and Family:   . Frequency of Social Gatherings with Friends and Family:   . Attends Religious Services:   . Active Member of Clubs or Organizations:   . Attends Archivist Meetings:   Marland Kitchen Marital Status:     Outpatient Encounter Medications as of 06/01/2019  Medication Sig  . aspirin 81 MG chewable tablet Chew 1 tablet (81 mg total) by mouth daily.  . clopidogrel (PLAVIX) 75 MG tablet Take 1 tablet (75 mg total) by mouth daily.  . clopidogrel (PLAVIX) 75 MG tablet Take 1 tablet (75 mg total) by mouth daily with breakfast.  . donepezil (ARICEPT) 10 MG tablet TAKE ONE TABLET AT BEDTIME.  Marland Kitchen levothyroxine (SYNTHROID) 75 MCG tablet TAKE ONE-HALF TABLET EVERY DAY  .  lisinopril (ZESTRIL) 20 MG tablet TAKE (1) TABLET BY MOUTH EVERY DAY  . metoprolol tartrate (LOPRESSOR) 25 MG tablet Take 1 tablet (25 mg total) by mouth 2 (two) times daily.  . [DISCONTINUED] atorvastatin (LIPITOR) 40 MG tablet Take 1 tablet (40 mg total) by mouth daily at 6 PM.  . [DISCONTINUED] ezetimibe (ZETIA) 10 MG tablet Take 1 tablet (10 mg total) by mouth daily. (Patient not taking: Reported on 01/19/2019)  . [DISCONTINUED] metoprolol succinate (TOPROL-XL) 25 MG 24 hr tablet TAKE ONE (1) TABLET BY MOUTH ONCE DAILY   No facility-administered encounter medications on file as of 06/01/2019.    Review of Systems  Constitutional: Negative for appetite change and unexpected weight change.  HENT: Negative for congestion and sinus pressure.   Respiratory: Negative for cough, chest tightness and shortness of breath.   Cardiovascular: Negative for chest pain, palpitations and leg swelling.  Gastrointestinal: Negative for abdominal pain, diarrhea, nausea and vomiting.  Genitourinary: Negative for difficulty urinating and dysuria.  Musculoskeletal: Negative for joint swelling and myalgias.  Skin: Negative for color change and rash.  Neurological: Negative for dizziness, light-headedness and headaches.  Psychiatric/Behavioral: Negative for agitation and dysphoric mood.       Objective:    Physical Exam Vitals reviewed.  Constitutional:      General: She is not in acute distress.    Appearance: Normal appearance.  HENT:     Head: Normocephalic and atraumatic.     Right Ear: External ear normal.     Left Ear: External ear normal.  Eyes:     General: No scleral icterus.       Right eye: No discharge.        Left eye: No discharge.     Conjunctiva/sclera: Conjunctivae normal.  Neck:     Thyroid: No thyromegaly.  Cardiovascular:     Rate and Rhythm: Normal rate and regular rhythm.  Pulmonary:     Effort: No respiratory distress.     Breath sounds: Normal breath sounds. No wheezing.   Abdominal:     General: Bowel sounds are normal.     Palpations: Abdomen is soft.     Tenderness: There is no abdominal tenderness.  Musculoskeletal:        General: No swelling or tenderness.     Cervical back: Neck supple. No tenderness.  Lymphadenopathy:     Cervical: No cervical adenopathy.  Skin:    Findings: No erythema or rash.  Neurological:     Mental Status: She is alert.  Psychiatric:        Mood and Affect: Mood normal.        Behavior:  Behavior normal.     BP 130/70   Pulse (!) 59   Temp (!) 97.5 F (36.4 C)   Resp 16   Ht 5' 3"  (1.6 m)   Wt 160 lb (72.6 kg)   SpO2 98%   BMI 28.34 kg/m  Wt Readings from Last 3 Encounters:  06/01/19 160 lb (72.6 kg)  02/01/19 153 lb (69.4 kg)  01/24/19 162 lb 4.8 oz (73.6 kg)     Lab Results  Component Value Date   WBC 10.0 06/01/2019   HGB 14.6 06/01/2019   HCT 44.3 06/01/2019   PLT 304.0 06/01/2019   GLUCOSE 121 (H) 06/01/2019   CHOL 252 (H) 06/01/2019   TRIG 225.0 (H) 06/01/2019   HDL 35.60 (L) 06/01/2019   LDLDIRECT 177.0 06/01/2019   LDLCALC 155 (H) 01/19/2019   ALT 7 06/01/2019   AST 12 06/01/2019   NA 139 06/01/2019   K 4.8 06/01/2019   CL 101 06/01/2019   CREATININE 1.16 06/01/2019   BUN 21 06/01/2019   CO2 31 06/01/2019   TSH 3.15 12/02/2018   INR 1.0 01/21/2019   HGBA1C 6.5 (H) 01/20/2019   MICROALBUR 0.9 12/02/2018    CT ABDOMEN PELVIS WO CONTRAST  Result Date: 01/22/2019 CLINICAL DATA:  Hypotension with decreasing hemoglobin. Evaluate for retroperitoneal hematoma. EXAM: CT ABDOMEN AND PELVIS WITHOUT CONTRAST TECHNIQUE: Multidetector CT imaging of the abdomen and pelvis was performed following the standard protocol without IV contrast. COMPARISON:  None. FINDINGS: Lower chest: Minimal basilar atelectasis. Coronary artery calcification is evident. Hepatobiliary: No focal abnormality in the liver on this study without intravenous contrast. Gallbladder is surgically absent. No intrahepatic or  extrahepatic biliary dilation. Pancreas: No focal mass lesion. No dilatation of the main duct. No intraparenchymal cyst. No peripancreatic edema. Spleen: No splenomegaly. No focal mass lesion. Adrenals/Urinary Tract: No adrenal nodule or mass. 2.3 cm lesion posterior interpolar right kidney has attenuation slightly higher than would be expected for a simple cyst. Left kidney unremarkable. Both kidneys excreting contrast, consistent with cardiac catheterization yesterday. No evidence for hydroureter. Urinary bladder decompressed by a Foley catheter. Stomach/Bowel: Small hiatal hernia. Stomach otherwise unremarkable. Duodenum is normally positioned as is the ligament of Treitz. No small bowel wall thickening. No small bowel dilatation. The terminal ileum is normal. The appendix is normal. No gross colonic mass. No colonic wall thickening. Diverticular changes are noted in the left colon without evidence of diverticulitis. Vascular/Lymphatic: There is abdominal aortic atherosclerosis without aneurysm. There is no gastrohepatic or hepatoduodenal ligament lymphadenopathy. No retroperitoneal or mesenteric lymphadenopathy. No pelvic sidewall lymphadenopathy. Reproductive: The uterus is unremarkable.  There is no adnexal mass. Other: No intraperitoneal free fluid. Musculoskeletal: There is some hemorrhage in the right groin region compatible with recent cardiac catheterization. No focal hematoma in the right groin. There is no extension of hemorrhage or evidence of hematoma into the right pelvic sidewall or retroperitoneal tissues of the abdomen/pelvis. IMPRESSION: 1. No retroperitoneal hematoma. There is some mild hemorrhage in the right groin, compatible with vascular access for yesterday's cardiac procedure. No focal hematoma in the right groin or extension of hemorrhage up into the right pelvic sidewall. 2. 2.3 cm interpolar right renal lesion with attenuation slightly higher than would be expected for a simple cyst.  This is likely a cyst complicated by proteinaceous debris or hemorrhage. MRI could provide more definitive characterization as clinically warranted. Follow-up ultrasound in 3 months could be used to ensure stability. 3. Small hiatal hernia. 4.  Aortic Atherosclerois (ICD10-170.0) Electronically Signed  By: Misty Stanley M.D.   On: 01/22/2019 13:31   CARDIAC CATHETERIZATION  Result Date: 01/21/2019  Prox RCA lesion is 95% stenosed.  Ost LAD lesion is 25% stenosed.  A drug-eluting stent was successfully placed using a STENT RESOLUTE ONYX 3.0X22.  Post intervention, there is a 0% residual stenosis.  Mild reduced left ventricular function global ejection fraction around 45%  Insignificant disease and LAD left main circumflex  Conclusion Mild reduced overall left ventricular function around 45% globally Insignificant disease in the left main LAD and circumflex Successful PCI and stent of mid RCA lesion 95% reduced to 0% after deployment and placement of a DES 3.0 x 22 mm stent Failed minx deployment Placement of FemoStop overnight Patient to be maintained on aspirin Plavix Statin was restarted       Assessment & Plan:   Problem List Items Addressed This Visit    Anemia - Primary   Relevant Orders   CBC with Differential/Platelet (Completed)   Vitamin B12 (Completed)   IBC + Ferritin (Completed)   CAD (coronary artery disease)    Known CAD.  S/p DES - RCA.  Continue plavix.  Sees cardiology.  Unable to take statin medication.  Stable.       Dementia without behavioral disturbance (HCC)    On aricept.  Continue.  Stable.       Diabetes mellitus with circulatory complication (HCC)    Low carb diet and exercise.  Follow met b and a1c.  On no medication.        Relevant Orders   Hemoglobin A1c   Health care maintenance   Hypercholesteremia    Intolerant to statin medication.  Not taking zetia.  Follow lipid panel.       Relevant Orders   Lipid panel (Completed)   Hepatic function  panel (Completed)   Hepatic function panel   Lipid panel   Hypertension, essential    Blood pressure as outlined.  Continue lisinopril 3m q day.  Follow pressures.  Follow metabolic panel.       Relevant Orders   Basic metabolic panel (Completed)   Basic metabolic panel   Hypothyroidism    On thyroid replacement.  Follow tsh.       Relevant Orders   TSH   Renal cyst    Found on CT (incidental finding).  Needs f/u renal ultrasound.            CEinar Pheasant MD

## 2019-06-06 ENCOUNTER — Encounter: Payer: Self-pay | Admitting: Internal Medicine

## 2019-06-06 NOTE — Assessment & Plan Note (Signed)
Low carb diet and exercise.  Follow met b and a1c. On no medication.  

## 2019-06-06 NOTE — Assessment & Plan Note (Signed)
Known CAD.  S/p DES - RCA.  Continue plavix.  Sees cardiology.  Unable to take statin medication.  Stable.

## 2019-06-06 NOTE — Assessment & Plan Note (Signed)
Blood pressure as outlined.  Continue lisinopril 20mg  q day.  Follow pressures.  Follow metabolic panel.

## 2019-06-06 NOTE — Assessment & Plan Note (Signed)
Intolerant to statin medication.  Not taking zetia.  Follow lipid panel.

## 2019-06-06 NOTE — Assessment & Plan Note (Signed)
On aricept.  Continue.  Stable.

## 2019-06-06 NOTE — Assessment & Plan Note (Signed)
On thyroid replacement.  Follow tsh.  

## 2019-06-06 NOTE — Assessment & Plan Note (Signed)
Found on CT (incidental finding).  Needs f/u renal ultrasound.

## 2019-06-07 ENCOUNTER — Other Ambulatory Visit: Payer: Self-pay

## 2019-06-07 MED ORDER — CYANOCOBALAMIN 1000 MCG/ML IJ SOLN
INTRAMUSCULAR | 1 refills | Status: DC
Start: 2019-06-07 — End: 2020-08-02

## 2019-06-07 MED ORDER — "SYRINGE 25G X 1"" 3 ML MISC": 1 refills | Status: AC

## 2019-07-27 ENCOUNTER — Other Ambulatory Visit: Payer: Self-pay | Admitting: Internal Medicine

## 2019-08-22 ENCOUNTER — Other Ambulatory Visit: Payer: Self-pay | Admitting: Internal Medicine

## 2019-08-22 DIAGNOSIS — Z1231 Encounter for screening mammogram for malignant neoplasm of breast: Secondary | ICD-10-CM

## 2019-09-01 ENCOUNTER — Other Ambulatory Visit: Payer: Self-pay | Admitting: Internal Medicine

## 2019-09-28 ENCOUNTER — Ambulatory Visit: Payer: Medicare PPO

## 2019-10-04 ENCOUNTER — Other Ambulatory Visit: Payer: Medicare PPO

## 2019-10-04 ENCOUNTER — Ambulatory Visit
Admission: RE | Admit: 2019-10-04 | Discharge: 2019-10-04 | Disposition: A | Discharge status: Home / Self Care | Payer: Medicare PPO | Source: Ambulatory Visit | Attending: Internal Medicine | Admitting: Internal Medicine

## 2019-10-04 ENCOUNTER — Other Ambulatory Visit: Payer: Self-pay

## 2019-10-04 DIAGNOSIS — Z1231 Encounter for screening mammogram for malignant neoplasm of breast: Secondary | ICD-10-CM | POA: Insufficient documentation

## 2019-10-05 ENCOUNTER — Other Ambulatory Visit: Payer: Self-pay | Admitting: Internal Medicine

## 2019-10-10 ENCOUNTER — Other Ambulatory Visit: Payer: Medicare PPO

## 2019-10-12 ENCOUNTER — Encounter: Payer: Self-pay | Admitting: Internal Medicine

## 2019-10-12 ENCOUNTER — Other Ambulatory Visit: Payer: Self-pay

## 2019-10-12 ENCOUNTER — Ambulatory Visit: Payer: Medicare PPO | Admitting: Internal Medicine

## 2019-10-12 VITALS — BP 108/66 | HR 70 | Temp 97.7°F | Ht 62.99 in | Wt 166.0 lb

## 2019-10-12 DIAGNOSIS — I10 Essential (primary) hypertension: Secondary | ICD-10-CM

## 2019-10-12 DIAGNOSIS — E039 Hypothyroidism, unspecified: Secondary | ICD-10-CM | POA: Diagnosis not present

## 2019-10-12 DIAGNOSIS — E1159 Type 2 diabetes mellitus with other circulatory complications: Secondary | ICD-10-CM

## 2019-10-12 DIAGNOSIS — D649 Anemia, unspecified: Secondary | ICD-10-CM

## 2019-10-12 DIAGNOSIS — N281 Cyst of kidney, acquired: Secondary | ICD-10-CM

## 2019-10-12 DIAGNOSIS — E7800 Pure hypercholesterolemia, unspecified: Secondary | ICD-10-CM

## 2019-10-12 DIAGNOSIS — I2510 Atherosclerotic heart disease of native coronary artery without angina pectoris: Secondary | ICD-10-CM | POA: Diagnosis not present

## 2019-10-12 DIAGNOSIS — F0390 Unspecified dementia without behavioral disturbance: Secondary | ICD-10-CM | POA: Diagnosis not present

## 2019-10-12 DIAGNOSIS — Z23 Encounter for immunization: Secondary | ICD-10-CM

## 2019-10-12 LAB — HEMOGLOBIN A1C: Hgb A1c MFr Bld: 6.9 % — ABNORMAL HIGH (ref 4.6–6.5)

## 2019-10-12 LAB — HEPATIC FUNCTION PANEL
ALT: 10 U/L (ref 0–35)
AST: 12 U/L (ref 0–37)
Albumin: 4.2 g/dL (ref 3.5–5.2)
Alkaline Phosphatase: 68 U/L (ref 39–117)
Bilirubin, Direct: 0.1 mg/dL (ref 0.0–0.3)
Total Bilirubin: 0.5 mg/dL (ref 0.2–1.2)
Total Protein: 6.9 g/dL (ref 6.0–8.3)

## 2019-10-12 LAB — BASIC METABOLIC PANEL
BUN: 17 mg/dL (ref 6–23)
CO2: 29 mEq/L (ref 19–32)
Calcium: 9.8 mg/dL (ref 8.4–10.5)
Chloride: 101 mEq/L (ref 96–112)
Creatinine, Ser: 1.08 mg/dL (ref 0.40–1.20)
GFR: 48.28 mL/min — ABNORMAL LOW (ref >60.00)
Glucose, Bld: 94 mg/dL (ref 70–99)
Potassium: 4.6 mEq/L (ref 3.5–5.1)
Sodium: 140 mEq/L (ref 135–145)

## 2019-10-12 LAB — CBC WITH DIFFERENTIAL/PLATELET
Basophils Absolute: 0.1 10*3/uL (ref 0.0–0.1)
Basophils Relative: 1.1 % (ref 0.0–3.0)
Eosinophils Absolute: 0.1 10*3/uL (ref 0.0–0.7)
Eosinophils Relative: 1.6 % (ref 0.0–5.0)
HCT: 42.8 % (ref 36.0–46.0)
Hemoglobin: 14 g/dL (ref 12.0–15.0)
Lymphocytes Relative: 20.2 % (ref 12.0–46.0)
Lymphs Abs: 1.9 10*3/uL (ref 0.7–4.0)
MCHC: 32.7 g/dL (ref 30.0–36.0)
MCV: 88.4 fl (ref 78.0–100.0)
Monocytes Absolute: 0.9 10*3/uL (ref 0.1–1.0)
Monocytes Relative: 9.1 % (ref 3.0–12.0)
Neutro Abs: 6.5 10*3/uL (ref 1.4–7.7)
Neutrophils Relative %: 68 % (ref 43.0–77.0)
Platelets: 289 10*3/uL (ref 150.0–400.0)
RBC: 4.84 Mil/uL (ref 3.87–5.11)
RDW: 14.7 % (ref 11.5–15.5)
WBC: 9.6 10*3/uL (ref 4.0–10.5)

## 2019-10-12 LAB — LIPID PANEL
Cholesterol: 241 mg/dL — ABNORMAL HIGH (ref 0–200)
HDL: 38.4 mg/dL — ABNORMAL LOW (ref >39.00)
NonHDL: 202.19
Total CHOL/HDL Ratio: 6
Triglycerides: 238 mg/dL — ABNORMAL HIGH (ref 0.0–149.0)
VLDL: 47.6 mg/dL — ABNORMAL HIGH (ref 0.0–40.0)

## 2019-10-12 LAB — LDL CHOLESTEROL, DIRECT: Direct LDL: 164 mg/dL

## 2019-10-12 LAB — TSH: TSH: 2.26 u[IU]/mL (ref 0.35–4.50)

## 2019-10-12 NOTE — Progress Notes (Signed)
Patient ID: Amanda [name], female   DOB: 1933-06-28, 86 y.o.   MRN: [id number]   Subjective:    Patient ID: Amanda [name], female    DOB: 08-Jan-1934, 86 y.o.   MRN: [id number]  HPI This visit occurred during the SARS-CoV-2 public health emergency.  Safety protocols were in place, including screening questions prior to the visit, additional usage of staff PPE, and extensive cleaning of exam room while observing appropriate contact time as indicated for disinfecting solutions.  Patient here for a scheduled follow up.  She is accompanied by her husband.  History obtained from both of them.  She reports she feels good.  Husband states her memory may be a little worse.  Taking aricept.  Tries to stay active.  States her back tires when she is up doing something.  No chest pain or sob.  No acid reflux or abdominal pain.  Bowels moving.  Reviewed medication.    Past Medical History:  Diagnosis Date  . Carotid bruit    bilaterally  . Dementia ([place])   . Hypercholesterolemia   . Hypertension   . Hypothyroidism    Past Surgical History:  Procedure Laterality Date  . CARPAL TUNNEL RELEASE    . CHOLECYSTECTOMY  1991  . CORONARY/GRAFT ACUTE MI REVASCULARIZATION N/A 01/21/2019   Procedure: Coronary/Graft Acute MI Revascularization;  Surgeon: Yolonda Kida, MD;  Location: [facility];  Service: Cardiovascular;  Laterality: N/A;  . LEFT HEART CATH AND CORONARY ANGIOGRAPHY N/A 01/21/2019   Procedure: LEFT HEART CATH AND CORONARY ANGIOGRAPHY;  Surgeon: Yolonda Kida, MD;  Location: [facility];  Service: Cardiovascular;  Laterality: N/A;  . THORACIC AORTA STENT  12/13   heart stent   Family History  Problem Relation Age of Onset  . Hypertension Father   . Lung cancer Father   . Hypertension Mother   . Dementia Mother   . Arthritis Other   . Breast cancer Cousin        mat cousin  . Colon cancer Neg Hx    Social History   Socioeconomic History  . Marital status:  Married    Spouse name: Not on file  . Number of children: 2  . Years of education: Not on file  . Highest education level: Not on file  Occupational History  . Occupation: retired Pharmacist, hospital  Tobacco Use  . Smoking status: Never Smoker  . Smokeless tobacco: Never Used  Vaping Use  . Vaping Use: Never used  Substance and Sexual Activity  . Alcohol use: No    Alcohol/week: 0.0 standard drinks  . Drug use: No  . Sexual activity: Not on file  Other Topics Concern  . Not on file  Social History Narrative   She is an only child. 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   Social Determinants of Health   Financial Resource Strain:   . Difficulty of Paying Living Expenses: Not on file  Food Insecurity:   . Worried About Charity fundraiser in the Last Year: Not on file  . Ran Out of Food in the Last Year: Not on file  Transportation Needs:   . Lack of Transportation (Medical): Not on file  . Lack of Transportation (Non-Medical): Not on file  Physical Activity:   . Days of Exercise per Week: Not on file  . Minutes of Exercise per Session: Not on file  Stress:   . Feeling of Stress :  Not on file  Social Connections:   . Frequency of Communication with Friends and Family: Not on file  . Frequency of Social Gatherings with Friends and Family: Not on file  . Attends Religious Services: Not on file  . Active Member of Clubs or Organizations: Not on file  . Attends Archivist Meetings: Not on file  . Marital Status: Not on file    Outpatient Encounter Medications as of 10/12/2019  Medication Sig  . aspirin 81 MG chewable tablet Chew 1 tablet (81 mg total) by mouth daily.  . clopidogrel (PLAVIX) 75 MG tablet Take 1 tablet (75 mg total) by mouth daily.  . clopidogrel (PLAVIX) 75 MG tablet Take 1 tablet (75 mg total) by mouth daily with breakfast.  . cyanocobalamin (,VITAMIN B-12,) 1000 MCG/ML injection Inject 1,000 mcg (21m) into the muscle once  a week for 4 weeks and then once every 30 days.  .Marland Kitchendonepezil (ARICEPT) 10 MG tablet TAKE ONE TABLET AT BEDTIME.  .Marland Kitchenlevothyroxine (SYNTHROID) 75 MCG tablet TAKE ONE-HALF TABLET EVERY DAY  . lisinopril (ZESTRIL) 20 MG tablet TAKE (1) TABLET BY MOUTH EVERY DAY  . metoprolol tartrate (LOPRESSOR) 25 MG tablet TAKE (1) TABLET BY MOUTH TWICE DAILY  . Syringe/Needle, Disp, (SYRINGE 3CC/25GX1") 25G X 1" 3 ML MISC Use as directed to administer b12 injections.   No facility-administered encounter medications on file as of 10/12/2019.    Review of Systems  Constitutional: Negative for appetite change and unexpected weight change.  HENT: Negative for congestion and sinus pressure.   Respiratory: Negative for cough, chest tightness and shortness of breath.   Cardiovascular: Negative for chest pain, palpitations and leg swelling.  Gastrointestinal: Negative for abdominal pain, diarrhea, nausea and vomiting.  Genitourinary: Negative for difficulty urinating and dysuria.  Musculoskeletal: Negative for joint swelling and myalgias.  Skin: Negative for color change and rash.  Neurological: Negative for dizziness, light-headedness and headaches.  Psychiatric/Behavioral: Negative for agitation and dysphoric mood.       Objective:    Physical Exam Vitals reviewed.  Constitutional:      General: She is not in acute distress.    Appearance: Normal appearance.  HENT:     Head: Normocephalic and atraumatic.     Right Ear: External ear normal.     Left Ear: External ear normal.  Eyes:     General: No scleral icterus.       Right eye: No discharge.        Left eye: No discharge.     Conjunctiva/sclera: Conjunctivae normal.  Neck:     Thyroid: No thyromegaly.  Cardiovascular:     Rate and Rhythm: Normal rate and regular rhythm.  Pulmonary:     Effort: No respiratory distress.     Breath sounds: Normal breath sounds. No wheezing.  Abdominal:     General: Bowel sounds are normal.     Palpations:  Abdomen is soft.     Tenderness: There is no abdominal tenderness.  Musculoskeletal:        General: No swelling or tenderness.     Cervical back: Neck supple. No tenderness.  Lymphadenopathy:     Cervical: No cervical adenopathy.  Skin:    Findings: No erythema or rash.  Neurological:     Mental Status: She is alert.  Psychiatric:        Mood and Affect: Mood normal.        Behavior: Behavior normal.     BP 108/66 (BP Location: Left  Arm, Patient Position: Sitting)   Pulse 70   Temp 97.7 F (36.5 C)   Ht 5' 2.99" (1.6 m)   Wt 166 lb (75.3 kg)   SpO2 96%   BMI 29.41 kg/m  Wt Readings from Last 3 Encounters:  10/12/19 166 lb (75.3 kg)  06/01/19 160 lb (72.6 kg)  02/01/19 153 lb (69.4 kg)     Lab Results  Component Value Date   WBC 9.6 10/12/2019   HGB 14.0 10/12/2019   HCT 42.8 10/12/2019   PLT 289.0 10/12/2019   GLUCOSE 94 10/12/2019   CHOL 241 (H) 10/12/2019   TRIG 238.0 (H) 10/12/2019   HDL 38.40 (L) 10/12/2019   LDLDIRECT 164.0 10/12/2019   LDLCALC 155 (H) 01/19/2019   ALT 10 10/12/2019   AST 12 10/12/2019   NA 140 10/12/2019   K 4.6 10/12/2019   CL 101 10/12/2019   CREATININE 1.08 10/12/2019   BUN 17 10/12/2019   CO2 29 10/12/2019   TSH 2.26 10/12/2019   INR 1.0 01/21/2019   HGBA1C 6.9 (H) 10/12/2019   MICROALBUR 0.9 12/02/2018    MM 3D SCREEN BREAST BILATERAL  Result Date: 10/04/2019 CLINICAL DATA:  Screening. EXAM: DIGITAL SCREENING BILATERAL MAMMOGRAM WITH TOMO AND CAD COMPARISON:  Previous exam(s). ACR Breast Density Category b: There are scattered areas of fibroglandular density. FINDINGS: There are no findings suspicious for malignancy. Images were processed with CAD. IMPRESSION: No mammographic evidence of malignancy. A result letter of this screening mammogram will be mailed directly to the patient. RECOMMENDATION: Screening mammogram in one year. (Code:SM-B-01Y) BI-RADS CATEGORY  1: Negative. Electronically Signed   By: Lovey Newcomer M.D.   On:  10/04/2019 13:12       Assessment & Plan:   Problem List Items Addressed This Visit    Renal cyst    Found incidentally on CT. Schedule f/u renal ultrasound if agreeable.        Hypothyroidism    On thyroid replacement.  Follow tsh.        Hypertension, essential    Blood pressure doing well.  Continue lisinopril 50m q day and metoprolol.   Follow pressures.  Follow metabolic panel.        Hypercholesteremia    Intolerant to statin medication.  Not taking zetia.  Low cholesterol diet and exercise.  Follow lipid panel.  Check today.       Diabetes mellitus with circulatory complication (HCC)    Low carb diet and exercise.  On no medication.  Follow met b and a1c.        Dementia without behavioral disturbance (HCC)    On aricept.  Follow.       CAD (coronary artery disease)    Known CAD.  S/p DES - RCA.  Continue plavix.  Sees cardiology.  Intolerant to statin medication.  Stable.  Follow.        Anemia    Follow cbc.        Other Visit Diagnoses    Need for influenza vaccination    -  Primary   Relevant Orders   Flu Vaccine QUAD High Dose(Fluad) (Completed)       CEinar Pheasant MD

## 2019-10-15 ENCOUNTER — Telehealth: Payer: Self-pay | Admitting: Internal Medicine

## 2019-10-15 ENCOUNTER — Encounter: Payer: Self-pay | Admitting: Internal Medicine

## 2019-10-15 DIAGNOSIS — N281 Cyst of kidney, acquired: Secondary | ICD-10-CM

## 2019-10-15 NOTE — Assessment & Plan Note (Signed)
Blood pressure doing well.  Continue lisinopril 20mg  q day and metoprolol.   Follow pressures.  Follow metabolic panel.

## 2019-10-15 NOTE — Assessment & Plan Note (Signed)
Follow cbc.  

## 2019-10-15 NOTE — Assessment & Plan Note (Signed)
Known CAD.  S/p DES - RCA.  Continue plavix.  Sees cardiology.  Intolerant to statin medication.  Stable.  Follow.   

## 2019-10-15 NOTE — Assessment & Plan Note (Signed)
Found incidentally on CT. Schedule f/u renal ultrasound if agreeable.

## 2019-10-15 NOTE — Assessment & Plan Note (Signed)
On thyroid replacement.  Follow tsh.  

## 2019-10-15 NOTE — Assessment & Plan Note (Signed)
On aricept.  Follow.   

## 2019-10-15 NOTE — Assessment & Plan Note (Signed)
Intolerant to statin medication.  Not taking zetia.  Low cholesterol diet and exercise.  Follow lipid panel.  Check today.

## 2019-10-15 NOTE — Telephone Encounter (Signed)
Please notify pts husband that I would like to check renal ultrasound.  What appears to be a kidney cyst was incidentally found on a previous CT scan.  I would like to f/u with ultrasound of kidney just to further evaluate.  If agreeable, let me know and I will place order.

## 2019-10-15 NOTE — Assessment & Plan Note (Signed)
Low carb diet and exercise.  On no medication.  Follow met b and a1c.   

## 2019-10-17 NOTE — Telephone Encounter (Signed)
Left message to return call 

## 2019-10-19 NOTE — Telephone Encounter (Signed)
Patients husband agreeable but would like to go to [place].

## 2019-10-20 NOTE — Addendum Note (Signed)
Addended by: Charm Barges on: 10/20/2019 04:48 AM   Modules accepted: Orders

## 2019-10-20 NOTE — Telephone Encounter (Signed)
Order placed for renal ultrasound  - at [facility]

## 2019-10-25 ENCOUNTER — Other Ambulatory Visit: Payer: Self-pay

## 2019-10-25 ENCOUNTER — Ambulatory Visit
Admission: RE | Admit: 2019-10-25 | Discharge: 2019-10-25 | Disposition: A | Discharge status: Home / Self Care | Payer: Medicare PPO | Source: Ambulatory Visit | Attending: Internal Medicine | Admitting: Internal Medicine

## 2019-10-25 DIAGNOSIS — N281 Cyst of kidney, acquired: Secondary | ICD-10-CM | POA: Diagnosis not present

## 2019-10-25 DIAGNOSIS — N261 Atrophy of kidney (terminal): Secondary | ICD-10-CM | POA: Diagnosis not present

## 2020-01-04 ENCOUNTER — Other Ambulatory Visit: Payer: Self-pay | Admitting: Internal Medicine

## 2020-02-13 ENCOUNTER — Encounter: Payer: Medicare PPO | Admitting: Internal Medicine

## 2020-04-14 ENCOUNTER — Other Ambulatory Visit: Payer: Self-pay | Admitting: Internal Medicine

## 2020-04-18 DIAGNOSIS — E119 Type 2 diabetes mellitus without complications: Secondary | ICD-10-CM | POA: Diagnosis not present

## 2020-04-18 DIAGNOSIS — I10 Essential (primary) hypertension: Secondary | ICD-10-CM | POA: Diagnosis not present

## 2020-04-18 DIAGNOSIS — E782 Mixed hyperlipidemia: Secondary | ICD-10-CM | POA: Diagnosis not present

## 2020-04-18 DIAGNOSIS — I739 Peripheral vascular disease, unspecified: Secondary | ICD-10-CM | POA: Diagnosis not present

## 2020-04-18 DIAGNOSIS — I25118 Atherosclerotic heart disease of native coronary artery with other forms of angina pectoris: Secondary | ICD-10-CM | POA: Diagnosis not present

## 2020-04-28 ENCOUNTER — Other Ambulatory Visit: Payer: Self-pay | Admitting: Internal Medicine

## 2020-05-25 ENCOUNTER — Other Ambulatory Visit: Payer: Self-pay

## 2020-05-25 ENCOUNTER — Telehealth: Payer: Self-pay | Admitting: Internal Medicine

## 2020-05-25 ENCOUNTER — Ambulatory Visit (INDEPENDENT_AMBULATORY_CARE_PROVIDER_SITE_OTHER): Payer: Medicare PPO | Admitting: Internal Medicine

## 2020-05-25 VITALS — BP 126/68 | HR 80 | Temp 97.0°F | Resp 16 | Ht 63.0 in | Wt 173.6 lb

## 2020-05-25 DIAGNOSIS — D649 Anemia, unspecified: Secondary | ICD-10-CM | POA: Diagnosis not present

## 2020-05-25 DIAGNOSIS — F0390 Unspecified dementia without behavioral disturbance: Secondary | ICD-10-CM | POA: Diagnosis not present

## 2020-05-25 DIAGNOSIS — I10 Essential (primary) hypertension: Secondary | ICD-10-CM

## 2020-05-25 DIAGNOSIS — I2510 Atherosclerotic heart disease of native coronary artery without angina pectoris: Secondary | ICD-10-CM | POA: Diagnosis not present

## 2020-05-25 DIAGNOSIS — E7800 Pure hypercholesterolemia, unspecified: Secondary | ICD-10-CM

## 2020-05-25 DIAGNOSIS — E1159 Type 2 diabetes mellitus with other circulatory complications: Secondary | ICD-10-CM | POA: Diagnosis not present

## 2020-05-25 DIAGNOSIS — Z0000 Encounter for general adult medical examination without abnormal findings: Secondary | ICD-10-CM

## 2020-05-25 DIAGNOSIS — E039 Hypothyroidism, unspecified: Secondary | ICD-10-CM | POA: Diagnosis not present

## 2020-05-25 LAB — HM DIABETES FOOT EXAM

## 2020-05-25 NOTE — Telephone Encounter (Signed)
Patient called in with  12-26-19/ [id number]/ Booster shot number [id number]

## 2020-05-25 NOTE — Progress Notes (Signed)
Patient ID: Amanda [name], female   DOB: 29-Jul-1933, 87 y.o.   MRN: [id number]   Subjective:    Patient ID: Amanda [name], female    DOB: 1933-06-27, 87 y.o.   MRN: [id number]  HPI This visit occurred during the SARS-CoV-2 public health emergency.  Safety protocols were in place, including screening questions prior to the visit, additional usage of staff PPE, and extensive cleaning of exam room while observing appropriate contact time as indicated for disinfecting solutions.  Patient here for her physical.  She is accompanied by her husband.  History obtained from both of them. Saw cardiology recently for f/u CAD.  Per note -  Planning sestamibi (exercise test) and echo.  She denies any chest pain.  No sob.  Eating.  No acid reflux.  No abdominal pain.  Bowels moving.  Some stiffness - hands.  She denies any significant pain.  Husband reports worsening problems with her memory.  Repeats herself a lot.  Ask same questions over and over.  Friends have passed away.  Increased stress related to this.  Taking aricept. Discussed adding namenda.  She declines.  Husband reports needs help at home.  Help with ADLs.    Past Medical History:  Diagnosis Date  . Carotid bruit    bilaterally  . Dementia ([place])   . Hypercholesterolemia   . Hypertension   . Hypothyroidism    Past Surgical History:  Procedure Laterality Date  . CARPAL TUNNEL RELEASE    . CHOLECYSTECTOMY  1991  . CORONARY/GRAFT ACUTE MI REVASCULARIZATION N/A 01/21/2019   Procedure: Coronary/Graft Acute MI Revascularization;  Surgeon: Yolonda Kida, MD;  Location: [facility];  Service: Cardiovascular;  Laterality: N/A;  . LEFT HEART CATH AND CORONARY ANGIOGRAPHY N/A 01/21/2019   Procedure: LEFT HEART CATH AND CORONARY ANGIOGRAPHY;  Surgeon: Yolonda Kida, MD;  Location: [facility];  Service: Cardiovascular;  Laterality: N/A;  . THORACIC AORTA STENT  12/13   heart stent   Family History  Problem Relation Age of  Onset  . Hypertension Father   . Lung cancer Father   . Hypertension Mother   . Dementia Mother   . Arthritis Other   . Breast cancer Cousin        mat cousin  . Colon cancer Neg Hx    Social History   Socioeconomic History  . Marital status: Married    Spouse name: Not on file  . Number of children: 2  . Years of education: Not on file  . Highest education level: Not on file  Occupational History  . Occupation: retired Pharmacist, hospital  Tobacco Use  . Smoking status: Never Smoker  . Smokeless tobacco: Never Used  Vaping Use  . Vaping Use: Never used  Substance and Sexual Activity  . Alcohol use: No    Alcohol/week: 0.0 standard drinks  . Drug use: No  . Sexual activity: Not on file  Other Topics Concern  . Not on file  Social History Narrative   She is an only child. 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   Social Determinants of Health   Financial Resource Strain: Not on file  Food Insecurity: Not on file  Transportation Needs: Not on file  Physical Activity: Not on file  Stress: Not on file  Social Connections: Not on file    Outpatient Encounter Medications as of 05/25/2020  Medication Sig  . aspirin 81 MG chewable tablet Chew  1 tablet (81 mg total) by mouth daily.  . clopidogrel (PLAVIX) 75 MG tablet Take 1 tablet (75 mg total) by mouth daily.  . clopidogrel (PLAVIX) 75 MG tablet Take 1 tablet (75 mg total) by mouth daily with breakfast.  . cyanocobalamin (,VITAMIN B-12,) 1000 MCG/ML injection Inject 1,000 mcg (29m) into the muscle once a week for 4 weeks and then once every 30 days.  .Marland Kitchendonepezil (ARICEPT) 10 MG tablet TAKE ONE TABLET AT BEDTIME.  .Marland Kitchenlevothyroxine (SYNTHROID) 75 MCG tablet TAKE ONE-HALF TABLET EVERY DAY  . lisinopril (ZESTRIL) 20 MG tablet TAKE (1) TABLET BY MOUTH EVERY DAY  . metoprolol tartrate (LOPRESSOR) 25 MG tablet TAKE (1) TABLET BY MOUTH TWICE DAILY  . Syringe/Needle, Disp, (SYRINGE 3CC/25GX1") 25G X 1" 3 ML  MISC Use as directed to administer b12 injections.   No facility-administered encounter medications on file as of 05/25/2020.    Review of Systems  Constitutional: Negative for appetite change and unexpected weight change.  HENT: Negative for congestion, sinus pressure and sore throat.   Eyes: Negative for pain and visual disturbance.  Respiratory: Negative for cough and chest tightness.        Breathing stable.   Cardiovascular: Negative for chest pain, palpitations and leg swelling.  Gastrointestinal: Negative for abdominal pain, diarrhea, nausea and vomiting.  Genitourinary: Negative for difficulty urinating and dysuria.  Musculoskeletal: Negative for joint swelling and myalgias.  Skin: Negative for color change and rash.  Neurological: Negative for dizziness, light-headedness and headaches.  Hematological: Negative for adenopathy. Does not bruise/bleed easily.  Psychiatric/Behavioral: Negative for agitation and dysphoric mood.       Objective:    Physical Exam Vitals reviewed.  Constitutional:      General: She is not in acute distress.    Appearance: Normal appearance. She is well-developed.  HENT:     Head: Normocephalic and atraumatic.     Right Ear: External ear normal.     Left Ear: External ear normal.  Eyes:     General: No scleral icterus.       Right eye: No discharge.        Left eye: No discharge.     Conjunctiva/sclera: Conjunctivae normal.  Neck:     Thyroid: No thyromegaly.  Cardiovascular:     Rate and Rhythm: Normal rate and regular rhythm.  Pulmonary:     Effort: No tachypnea, accessory muscle usage or respiratory distress.     Breath sounds: Normal breath sounds. No decreased breath sounds or wheezing.  Chest:  Breasts:     Right: No inverted nipple, mass, nipple discharge or tenderness (no axillary adenopathy).     Left: No inverted nipple, mass, nipple discharge or tenderness (no axilarry adenopathy).    Abdominal:     General: Bowel sounds  are normal.     Palpations: Abdomen is soft.     Tenderness: There is no abdominal tenderness.  Musculoskeletal:        General: No swelling or tenderness.     Cervical back: Neck supple. No tenderness.  Lymphadenopathy:     Cervical: No cervical adenopathy.  Skin:    Findings: No erythema or rash.  Neurological:     Mental Status: She is alert.  Psychiatric:        Mood and Affect: Mood normal.        Behavior: Behavior normal.     BP 126/68   Pulse 80   Temp (!) 97 F (36.1 C) (Temporal)   Resp  16   Ht 5' 3"  (1.6 m)   Wt 173 lb 9.6 oz (78.7 kg)   SpO2 98%   BMI 30.75 kg/m  Wt Readings from Last 3 Encounters:  05/25/20 173 lb 9.6 oz (78.7 kg)  10/12/19 166 lb (75.3 kg)  06/01/19 160 lb (72.6 kg)     Lab Results  Component Value Date   WBC 9.6 10/12/2019   HGB 14.0 10/12/2019   HCT 42.8 10/12/2019   PLT 289.0 10/12/2019   GLUCOSE 86 05/25/2020   CHOL 260 (H) 05/25/2020   TRIG 305 (H) 05/25/2020   HDL 36 (L) 05/25/2020   LDLDIRECT 164.0 10/12/2019   LDLCALC 172 (H) 05/25/2020   ALT 6 05/25/2020   AST 11 05/25/2020   NA 140 05/25/2020   K 4.2 05/25/2020   CL 103 05/25/2020   CREATININE 1.12 (H) 05/25/2020   BUN 13 05/25/2020   CO2 27 05/25/2020   TSH 2.26 10/12/2019   INR 1.0 01/21/2019   HGBA1C 6.5 (H) 05/25/2020   MICROALBUR 0.3 05/25/2020    US Renal  Result Date: 10/26/2019 CLINICAL DATA:  87 year old female with right renal midpole cyst on 2015 ultrasound and CT Abdomen and Pelvis 01/22/2019. EXAM: RENAL / URINARY TRACT ULTRASOUND COMPLETE COMPARISON:  CT Abdomen and Pelvis 01/22/2019 Renal ultrasound 06/07/2013. FINDINGS: Right Kidney: Renal measurements: 10.4 x 5.3 x 5.8 cm = volume: 166 mL. Thinning of the renal cortex since 2015. No right hydronephrosis. Chronic right renal midpole cyst now measures up to 2.6 cm (1.5 cm in 2015), but continues to have a simple, benign ultrasound appearance (anechoic, no vascular elements image 14). No other right  renal lesion. Left Kidney: Renal measurements: 10.9 x 4.8 x 5.2 cm = volume: 144 mL. Thinning of the left renal cortex since 2015. No left hydronephrosis or renal lesion. Bladder: Appears normal for degree of bladder distention. Other: None. IMPRESSION: 1. Right renal midpole cyst appears benign and inconsequential. 2. Some bilateral renal atrophy since 2015, but no acute renal finding. Electronically Signed   By: Genevie Ann M.D.   On: 10/26/2019 15:57       Assessment & Plan:   Problem List Items Addressed This Visit    Anemia    Follow cbc.       CAD (coronary artery disease)    Known CAD s/p DES - RCA.  Continues on aspirin.  Intolerant to statin medication.  Follow.       Dementia without behavioral disturbance ([place])    Continues on aricept.  Worsening memory issues.  Discussed namenda.  She declines.  Husband agrees - hold off at this time.  Follow.  Discussed with her husband.  Needs help at home with ADLs, etc.  Desires social work consultation.       Relevant Orders   AMB Referral to Community Care Coordinaton   Diabetes mellitus with circulatory complication ([place])    On no medication.  Low carb diet and exercise.  Follow met b and a1c.        Relevant Orders   Basic metabolic panel (Completed)   Hemoglobin A1c (Completed)   Microalbumin / creatinine urine ratio (Completed)   Health care maintenance    Physical today 05/25/20.  Mammogram 10/04/19 - [facility].       Hypercholesteremia    Intolerant to statin medication.  Not taking zetia.  Low cholesterol diet and exercise.  Follow lipid panel.       Relevant Orders   Hepatic function panel (Completed)  Lipid panel (Completed)   Hypertension, essential    Continue lisinopril and metoprolol.  Blood pressure doing well.  Follow pressures.  Follow metabolic panel.       Hypothyroidism    On thyroid replacement.  Follow tsh.        Other Visit Diagnoses    Routine general medical examination at a health care facility     -  Primary       Einar Pheasant, MD

## 2020-05-26 ENCOUNTER — Encounter: Payer: Self-pay | Admitting: Internal Medicine

## 2020-05-26 LAB — BASIC METABOLIC PANEL
BUN/Creatinine Ratio: 12 (calc) (ref 6–22)
BUN: 13 mg/dL (ref 7–25)
CO2: 27 mmol/L (ref 20–32)
Calcium: 9.8 mg/dL (ref 8.6–10.4)
Chloride: 103 mmol/L (ref 98–110)
Creat: 1.12 mg/dL — ABNORMAL HIGH (ref 0.60–0.88)
Glucose, Bld: 86 mg/dL (ref 65–99)
Potassium: 4.2 mmol/L (ref 3.5–5.3)
Sodium: 140 mmol/L (ref 135–146)

## 2020-05-26 LAB — LIPID PANEL
Cholesterol: 260 mg/dL — ABNORMAL HIGH (ref <200)
HDL: 36 mg/dL — ABNORMAL LOW (ref >=50)
LDL Cholesterol (Calc): 172 mg/dL (calc) — ABNORMAL HIGH
Non-HDL Cholesterol (Calc): 224 mg/dL (calc) — ABNORMAL HIGH (ref <130)
Total CHOL/HDL Ratio: 7.2 (calc) — ABNORMAL HIGH (ref <5.0)
Triglycerides: 305 mg/dL — ABNORMAL HIGH (ref <150)

## 2020-05-26 LAB — HEPATIC FUNCTION PANEL
AG Ratio: 1.3 (calc) (ref 1.0–2.5)
ALT: 6 U/L (ref 6–29)
AST: 11 U/L (ref 10–35)
Albumin: 3.8 g/dL (ref 3.6–5.1)
Alkaline phosphatase (APISO): 71 U/L (ref 37–153)
Bilirubin, Direct: 0.1 mg/dL (ref 0.0–0.2)
Globulin: 2.9 g/dL (calc) (ref 1.9–3.7)
Indirect Bilirubin: 0.2 mg/dL (calc) (ref 0.2–1.2)
Total Bilirubin: 0.3 mg/dL (ref 0.2–1.2)
Total Protein: 6.7 g/dL (ref 6.1–8.1)

## 2020-05-26 LAB — MICROALBUMIN / CREATININE URINE RATIO
Creatinine, Urine: 59 mg/dL (ref 20–275)
Microalb Creat Ratio: 5 mcg/mg creat (ref <30)
Microalb, Ur: 0.3 mg/dL

## 2020-05-26 LAB — HEMOGLOBIN A1C
Hgb A1c MFr Bld: 6.5 % of total Hgb — ABNORMAL HIGH (ref <5.7)
Mean Plasma Glucose: 140 mg/dL
eAG (mmol/L): 7.7 mmol/L

## 2020-05-26 NOTE — Assessment & Plan Note (Signed)
Known CAD s/p DES - RCA.  Continues on aspirin.  Intolerant to statin medication.  Follow.

## 2020-05-26 NOTE — Assessment & Plan Note (Addendum)
Continues on aricept.  Worsening memory issues.  Discussed namenda.  She declines.  Husband agrees - hold off at this time.  Follow.  Discussed with her husband.  Needs help at home with ADLs, etc.  Desires social work consultation.

## 2020-05-26 NOTE — Assessment & Plan Note (Signed)
Follow cbc.  

## 2020-05-26 NOTE — Assessment & Plan Note (Signed)
Physical today 05/25/20.  Mammogram 10/04/19 - [facility] I.

## 2020-05-26 NOTE — Assessment & Plan Note (Signed)
Continue lisinopril and metoprolol.  Blood pressure doing well.  Follow pressures.  Follow metabolic panel.

## 2020-05-26 NOTE — Assessment & Plan Note (Signed)
On thyroid replacement.  Follow tsh.  

## 2020-05-26 NOTE — Assessment & Plan Note (Signed)
Intolerant to statin medication.  Not taking zetia.  Low cholesterol diet and exercise.  Follow lipid panel.  

## 2020-05-26 NOTE — Assessment & Plan Note (Signed)
On no medication.  Low carb diet and exercise.  Follow met b and a1c.   

## 2020-05-28 ENCOUNTER — Telehealth: Payer: Self-pay

## 2020-05-28 NOTE — Chronic Care Management (AMB) (Signed)
  Chronic Care Management   Outreach Note  05/28/2020 Name: Amanda [name] MRN: [id number] DOB: 1934/01/28  Amanda [name] is a 87 y.o. year old female who is a primary care patient of Dale Howardwick, MD. I reached out to Amanda [name] by phone today in response to a referral sent by Ms. Amanda [name]'s PCP, Dale [place], MD     An unsuccessful telephone outreach was attempted today. The patient was referred to the case management team for assistance with care management and care coordination.   Follow Up Plan: The care management team will reach out to the patient again over the next 5 days.  If patient returns call to provider office, please advise to call Embedded Care Management Care Guide Penne Lash at [phone]  Penne Lash, RMA Care Guide, Embedded Care Coordination [facility]  [place], [place] [address] Direct Dial: [phone] [email or web address]: [email or web address]

## 2020-05-28 NOTE — Telephone Encounter (Signed)
Documented in chart.

## 2020-06-01 ENCOUNTER — Telehealth: Payer: Self-pay

## 2020-06-01 NOTE — Chronic Care Management (AMB) (Signed)
  Chronic Care Management   Note  06/01/2020 Name: Amanda [name] MRN: [id number] DOB: 05/20/1933  Amanda [name] is a 87 y.o. year old female who is a primary care patient of Einar Pheasant, MD. I reached out to [name] by phone today in response to a referral sent by Ms. Gaspar Garbe [place]'s PCP, Einar Pheasant, MD     Ms. Furniss was given information about Chronic Care Management services today including:  1. CCM service includes personalized support from designated clinical staff supervised by her physician, including individualized plan of care and coordination with other care providers 2. 24/7 contact phone numbers for assistance for urgent and routine care needs. 3. Service will only be billed when office clinical staff spend 20 minutes or more in a month to coordinate care. 4. Only one practitioner may furnish and bill the service in a calendar month. 5. The patient may stop CCM services at any time (effective at the end of the month) by phone call to the office staff. 6. The patient will be responsible for cost sharing (co-pay) of up to 20% of the service fee (after annual deductible is met).  Patient agreed to services and verbal consent obtained.   Follow up plan: Telephone appointment with care management team member scheduled for:06/11/2020   Noreene Larsson, [place], [place],  [address] Direct Dial: [phone] [email or web address]: [email or web address]

## 2020-06-11 ENCOUNTER — Ambulatory Visit (INDEPENDENT_AMBULATORY_CARE_PROVIDER_SITE_OTHER): Payer: Medicare PPO | Admitting: *Deleted

## 2020-06-11 DIAGNOSIS — E1159 Type 2 diabetes mellitus with other circulatory complications: Secondary | ICD-10-CM

## 2020-06-11 DIAGNOSIS — I10 Essential (primary) hypertension: Secondary | ICD-10-CM

## 2020-06-11 DIAGNOSIS — F0390 Unspecified dementia without behavioral disturbance: Secondary | ICD-10-CM | POA: Diagnosis not present

## 2020-06-11 DIAGNOSIS — W19XXXA Unspecified fall, initial encounter: Secondary | ICD-10-CM

## 2020-06-11 NOTE — Chronic Care Management (AMB) (Signed)
Chronic Care Management    Clinical Social Work Note  06/11/2020 Name: Amanda [name] MRN: [id number] DOB: [birth year]     . Amanda [name] is a 87 y.o. year old female who is a primary care patient of Dale [place], MD. The CCM team was consulted to assist the patient with chronic disease management and/or care coordination needs related to: [employer], Level of Care Concerns and Caregiver Stress in Patient with Dementia without Behavioral Disturbance, Diabetes Mellitus with Circulatory Complication, History of Falls/Fall Risk, Osteopenia and Hypertension, needs support and education to resolve unmet personal care needs in the home. .   Engaged with patient and husband, Amanda [name] by telephone for initial visit in response to provider referral for social work chronic care management and care coordination services.   Consent to Services:  The patient was given information about Chronic Care Management services, agreed to services, and gave verbal consent prior to initiation of services.  Please see initial visit note for detailed documentation.   Patient agreed to services and consent obtained.   Assessment: Review of patient past medical history, allergies, medications, and health status, including review of relevant consultants reports was performed today as part of a comprehensive evaluation and provision of chronic care management and care coordination services.     SDOH (Social Determinants of Health) assessments and interventions performed:  SDOH Interventions   Flowsheet Row Most Recent Value  SDOH Interventions   Food Insecurity Interventions Intervention Not Indicated  [Verified by husband - Bruce Payton]  Financial Strain Interventions Intervention Not Indicated  [Verified by husband Smitty Cords Fetsch]  Housing Interventions Intervention Not Indicated  [Verified by husband - Bruce Ulibarri]  Intimate Partner Violence Interventions Intervention Not Indicated  [Verified by  husband - Bruce Fraser]  Physical Activity Interventions Intervention Not Indicated, Patient Refused  Stress Interventions Intervention Not Indicated  [Verified by husband - Bruce Crosley]  Social Connections Interventions Intervention Not Indicated  [Verified by husband Smitty Cords Hileman]  Transportation Interventions Intervention Not Indicated  [Verified by husband - Bruce Pembroke]       Advanced Directives Status: Advanced Directives (Living Will and Healthcare Power of Attorney documents) in place.  CCM Care Plan  Allergies  Allergen Reactions  . Atorvastatin Other (See Comments)    Muscle Pain  . Sulfa Antibiotics     Outpatient Encounter Medications as of 06/11/2020  Medication Sig  . aspirin 81 MG chewable tablet Chew 1 tablet (81 mg total) by mouth daily.  . clopidogrel (PLAVIX) 75 MG tablet Take 1 tablet (75 mg total) by mouth daily.  . clopidogrel (PLAVIX) 75 MG tablet Take 1 tablet (75 mg total) by mouth daily with breakfast.  . cyanocobalamin (,VITAMIN B-12,) 1000 MCG/ML injection Inject 1,000 mcg (27mL) into the muscle once a week for 4 weeks and then once every 30 days.  Marland Kitchen donepezil (ARICEPT) 10 MG tablet TAKE ONE TABLET AT BEDTIME.  Marland Kitchen levothyroxine (SYNTHROID) 75 MCG tablet TAKE ONE-HALF TABLET EVERY DAY  . lisinopril (ZESTRIL) 20 MG tablet TAKE (1) TABLET BY MOUTH EVERY DAY  . metoprolol tartrate (LOPRESSOR) 25 MG tablet TAKE (1) TABLET BY MOUTH TWICE DAILY  . Syringe/Needle, Disp, (SYRINGE 3CC/25GX1") 25G X 1" 3 ML MISC Use as directed to administer b12 injections.   No facility-administered encounter medications on file as of 06/11/2020.    Patient Active Problem List   Diagnosis Date Noted  . Renal cyst 02/01/2019  . Anemia 02/01/2019  . Acute ST elevation myocardial infarction (STEMI)  of inferior wall (HCC)   . STEMI involving right coronary artery (HCC) 01/21/2019  . Unstable angina pectoris (HCC) 01/19/2019  . Abdominal bruit 09/14/2017  . Muscle ache  03/06/2017  . Fall 12/31/2014  . Health care maintenance 05/20/2014  . Diabetes mellitus with circulatory complication (HCC) 05/17/2013  . CAD (coronary artery disease) 05/15/2012  . Osteopenia 12/14/2011  . Hypertension, essential 11/13/2011  . Hypercholesteremia 11/13/2011  . Dementia without behavioral disturbance (HCC) 11/13/2011  . Hypothyroidism 11/13/2011    Conditions to be addressed/monitored: [employer], Level of Care Concerns and Caregiver Stress in Patient with Dementia without Behavioral Disturbance, Diabetes Mellitus with Circulatory Complication, History of Falls/Fall Risk, Osteopenia and Hypertension, needs support and education to resolve unmet personal care needs in the home. Limited Social Support, Limited Access to [employer], Level of Care Concerns, Medication Procurement, ADL/IADL Limitations, Mental Health Concerns, Social Isolation, Limited Access to Caregiver, Cognitive Deficits and Memory Deficits.  Care Plan : LCSW Plan of Care  Updates made by [name], LCSW since 06/11/2020 12:00 AM    Problem: Maintain My Quality of Life through [facility].   Priority: High    Goal: Maintain My Quality of Life through [facility].   Start Date: 06/11/2020  Expected End Date: 07/04/2020  This Visit's Progress: On track  Priority: High  Note:   Current Barriers:   . Patient with Dementia without Behavioral Disturbance, Diabetes Mellitus with Circulatory Complication, History of Falls/Fall Risk, Osteopenia and Hypertension needs support, education, and care coordination to resolve unmet personal care needs in the home. . Patient is unable to self-administer medications as prescribed. . Patient is unable to consistently perform ADL's/IADL's independently. . Level of care concerns. Darylene Price knowledge of available community agencies and resources. Clinical Goals:  . Over the next 30 days, patient will have in-home care services in place,  through [name] policy with [name]. Marland Kitchen LCSW will contact a representative at [employer] to inquire about patient and husband, [name] through [name] (I.e List of preferred provider agencies, hours of coverage per day, out-of-pocket expense, etc.).    Marland Kitchen Patient and husband, Shahed Yeoman will work with [name] and [name] to coordinate care in the home.   Marland Kitchen LCSW will obtain a list of preferred provider agencies from [facility] policy and encourage patient and her husband, Yanique Mulvihill to decide on at least 4 agencies of interest from that list. . Patient will attend all scheduled medical appointments as evidenced by patient report and care team review of appointment completion in electronic medical record. . Patient will demonstrate improved health management independence as evidenced by having in-home care services in place. Clinical Interventions : . Patient and husband, Lynnmarie Lovett interviewed and appropriate assessments performed. Marland Kitchen Collaboration with patient's Primary Care Physician, Dr. Dale [place], regarding development and update of comprehensive plan of care as evidenced by provider attestation and co-signature. Rene Paci care team collaboration (see longitudinal plan of care). . Interventions performed:  Problem Solving/Task Centered, Psychoeducation/Health Education, Quality of Sleep Assessed and Sleep Hygiene Techniques Promoted, Caregiver Stress Acknowledged and Consideration of In-Home Care Services Encouraged. . Provide patient and husband, Jocelyne Reinertsen with a list of preferred provider agencies and have them provide LCSW with at least 4 agencies of interest, during the next scheduled telephone outreach call. . Discussed plans with patient and husband, Jewell Haught for ongoing care management follow-up and provided direct contact information  for care management team. . Assisted  patient and husband, Daianna Vasques with obtaining information about health plan benefits through [facility] and [name]. . Provided education to patient and husband, Dorann Davidson regarding level of care options. . Assessed needs, level of care concerns, basic eligibility and provided education on in-home care services referral process. . Identified resources and durable medical equipment needed in the home to improve safety and promote independence. Patient Goals/Self-Care Activities:  . Review list of preferred agency providers, obtained through [facility], and be prepared to provide LCSW with at least 4 agencies of interest, during the next scheduled telephone outreach call. . Work with [name] on a weekly basis until approved for in-home care services through [facility]. . Continue to try and perform activities of daily living independently, but with supervision. Remember: . Always use handrails on the stairs. . Always use your cane or walker when ambulating. . Always wear your glasses, especially at night or in areas not well lit.  Follow Up Plan: LCSW will follow-up with patient and patient's daughter by telephone on 06/19/2020 at 9:30am.      Follow Up Plan: 06/19/2020 at 9:30am.      Danford Bad LCSW Licensed Clinical Social Worker [facility]  [phone]

## 2020-06-11 NOTE — Patient Instructions (Signed)
Visit Information   PATIENT GOALS:  Goals Addressed            This Visit's Progress   . Maintain My Quality of Life through [facility].   On track    Timeframe:  Short-Term Goal Priority:  High Start Date:    06/11/2020                      Expected End Date:   07/04/2020         Follow Up Date:  06/19/2020 at 9:30am.   Patient Goals: . Review list of preferred agency providers, obtained through [facility], and be prepared to provide LCSW with at least 4 agencies of interest, during the next scheduled telephone outreach call. . Work with [employer] on a weekly basis until approved for in-home care services through [employer]. . Continue to try and perform activities of daily living independently, but with supervision.           Consent to CCM Services: Ms. Kos was given information about Chronic Care Management services today including:  1. CCM service includes personalized support from designated clinical staff supervised by her physician, including individualized plan of care and coordination with other care providers 2. 24/7 contact phone numbers for assistance for urgent and routine care needs. 3. Service will only be billed when office clinical staff spend 20 minutes or more in a month to coordinate care. 4. Only one practitioner may furnish and bill the service in a calendar month. 5. The patient may stop CCM services at any time (effective at the end of the month) by phone call to the office staff. 6. The patient will be responsible for cost sharing (co-pay) of up to 20% of the service fee (after annual deductible is met).  Patient agreed to services and verbal consent obtained.   Patient verbalizes understanding of instructions provided today and agrees to view in [place].   Telephone follow up appointment with care management team member scheduled for:  06/19/2020 at 9:30am.  Nat Christen  LCSW Licensed Clinical Social Worker [place]  [phone]  CLINICAL CARE PLAN: Patient Care Plan: LCSW Plan of Care    Problem Identified: Maintain My Quality of Life through [facility].   Priority: High    Goal: Maintain My Quality of Life through [facility].   Start Date: 06/11/2020  Expected End Date: 07/04/2020  This Visit's Progress: On track  Priority: High  Note:   Current Barriers:   . Patient with Dementia without Behavioral Disturbance, Diabetes Mellitus with Circulatory Complication, History of Falls/Fall Risk, Osteopenia and Hypertension needs support, education, and care coordination to resolve unmet personal care needs in the home. . Patient is unable to self-administer medications as prescribed. . Patient is unable to consistently perform ADL's/IADL's independently. . Level of care concerns. Leodis Liverpool knowledge of available community agencies and resources. Clinical Goals:  . Over the next 30 days, patient will have in-home care services in place, through [place] with [employer]. Marland Kitchen LCSW will contact a representative at [place] to inquire about patient and husband, Bruce [employer] through [employer] (I.e List of preferred provider agencies, hours of coverage per day, out-of-pocket expense, etc.).    Marland Kitchen Patient and husband, Shemaiah Round will work with [employer] and [name] to coordinate care in the home.   Marland Kitchen LCSW will obtain a  list of preferred provider agencies from [place] and encourage patient and her husband, Makayleigh Poliquin to decide on at least 4 agencies of interest from that list. . Patient will attend all scheduled medical appointments as evidenced by patient report and care team review of appointment completion in electronic medical record. . Patient will demonstrate improved health management independence as evidenced by having in-home  care services in place. Clinical Interventions : . Patient and husband, Nisreen Guise interviewed and appropriate assessments performed. Marland Kitchen Collaboration with patient's Primary Care Physician, Dr. Einar Pheasant, regarding development and update of comprehensive plan of care as evidenced by provider attestation and co-signature. Bertram Savin care team collaboration (see longitudinal plan of care). . Interventions performed:  Problem Solving/Task Centered, Psychoeducation/Health Education, Quality of Sleep Assessed and Sleep Hygiene Techniques Promoted, Caregiver Stress Acknowledged and Consideration of In-Home Care Services Encouraged. . Provide patient and husband, Vernis Cabacungan with a list of preferred provider agencies and have them provide LCSW with at least 4 agencies of interest, during the next scheduled telephone outreach call. . Discussed plans with patient and husband, Bentley Haralson for ongoing care management follow-up and provided direct contact information for care management team. . Assisted patient and husband, Aixa Corsello with obtaining information about health plan benefits through [facility] and [employer]. . Provided education to patient and husband, Schyler Butikofer regarding level of care options. . Assessed needs, level of care concerns, basic eligibility and provided education on in-home care services referral process. . Identified resources and durable medical equipment needed in the home to improve safety and promote independence. Patient Goals/Self-Care Activities:  . Review list of preferred agency providers, obtained through [facility], and be prepared to provide LCSW with at least 4 agencies of interest, during the next scheduled telephone outreach call. . Work with [employer] on a weekly basis until approved for in-home care services through [employer]. . Continue to try and perform activities of daily  living independently, but with supervision. Remember: . Always use handrails on the stairs. . Always use your cane or walker when ambulating. . Always wear your glasses, especially at night or in areas not well lit.  Follow Up Plan: LCSW will follow-up with patient and patient's daughter by telephone on 06/19/2020 at 9:30am.

## 2020-06-19 ENCOUNTER — Ambulatory Visit: Payer: Medicare PPO | Admitting: *Deleted

## 2020-06-19 DIAGNOSIS — E1159 Type 2 diabetes mellitus with other circulatory complications: Secondary | ICD-10-CM

## 2020-06-19 DIAGNOSIS — F0390 Unspecified dementia without behavioral disturbance: Secondary | ICD-10-CM

## 2020-06-19 DIAGNOSIS — I10 Essential (primary) hypertension: Secondary | ICD-10-CM

## 2020-06-19 DIAGNOSIS — W19XXXA Unspecified fall, initial encounter: Secondary | ICD-10-CM

## 2020-06-19 NOTE — Chronic Care Management (AMB) (Signed)
Chronic Care Management    Clinical Social Work Note  06/19/2020 Name: Amanda Amanda [name] MRN: [id number] DOB: 1933-12-06  Amanda Amanda [name] is a 87 y.o. year old female who is a primary care patient of Amanda [place], MD. The CCM team was consulted to assist the patient with chronic disease management and/or care coordination needs related to: [employer], Level of Care Concerns and Caregiver Stress in Patient with Dementia without Behavioral Disturbance, Diabetes Mellitus with Circulatory Complication, History of Falls/Fall Risk, Osteopenia and Hypertension.  Engaged with patient and husband, Amanda Amanda [name] by telephone for follow up visit in response to provider referral for social work chronic care management and care coordination services.   Consent to Services:  The patient was given information about Chronic Care Management services, agreed to services, and gave verbal consent prior to initiation of services.  Please see initial visit note for detailed documentation.   Patient agreed to services and consent obtained.   Assessment: Review of patient past medical history, allergies, medications, and health status, including review of relevant consultants reports was performed today as part of a comprehensive evaluation and provision of chronic care management and care coordination services.     SDOH (Social Determinants of Health) assessments and interventions performed:    Advanced Directives Status: See Care Plan for related entries.  Advanced Directives already in place.  CCM Care Plan  Allergies  Allergen Reactions  . Atorvastatin Other (See Comments)    Muscle Pain  . Sulfa Antibiotics     Outpatient Encounter Medications as of 06/19/2020  Medication Sig  . aspirin 81 MG chewable tablet Chew 1 tablet (81 mg total) by mouth daily.  . clopidogrel (PLAVIX) 75 MG tablet Take 1 tablet (75 mg total) by mouth daily.  . clopidogrel (PLAVIX) 75 MG tablet Take 1 tablet (75 mg total) by  mouth daily with breakfast.  . cyanocobalamin (,VITAMIN B-12,) 1000 MCG/ML injection Inject 1,000 mcg (49mL) into the muscle once a week for 4 weeks and then once every 30 days.  Marland Kitchen donepezil (ARICEPT) 10 MG tablet TAKE ONE TABLET AT BEDTIME.  Marland Kitchen levothyroxine (SYNTHROID) 75 MCG tablet TAKE ONE-HALF TABLET EVERY DAY  . lisinopril (ZESTRIL) 20 MG tablet TAKE (1) TABLET BY MOUTH EVERY DAY  . metoprolol tartrate (LOPRESSOR) 25 MG tablet TAKE (1) TABLET BY MOUTH TWICE DAILY  . Syringe/Needle, Disp, (SYRINGE 3CC/25GX1") 25G X 1" 3 ML MISC Use as directed to administer b12 injections.   No facility-administered encounter medications on file as of 06/19/2020.    Patient Active Problem List   Diagnosis Date Noted  . Renal cyst 02/01/2019  . Anemia 02/01/2019  . Acute ST elevation myocardial infarction (STEMI) of inferior wall (HCC)   . STEMI involving right coronary artery (HCC) 01/21/2019  . Unstable angina pectoris (HCC) 01/19/2019  . Abdominal bruit 09/14/2017  . Muscle ache 03/06/2017  . Fall 12/31/2014  . Health care maintenance 05/20/2014  . Diabetes mellitus with circulatory complication (HCC) 05/17/2013  . CAD (coronary artery disease) 05/15/2012  . Osteopenia 12/14/2011  . Hypertension, essential 11/13/2011  . Hypercholesteremia 11/13/2011  . Dementia without behavioral disturbance (HCC) 11/13/2011  . Hypothyroidism 11/13/2011    Conditions to be addressed/monitored: Dementia without Behavioral Disturbance, Diabetes Mellitus with Circulatory Complication, History of Falls/Fall Risk, Osteopenia and Hypertension. Limited Social Support, Level of Care Concerns, ADL/IADL Limitations, Social Isolation, Limited Access to Caregiver, Cognitive Deficits and Memory Deficits.  Care Plan : LCSW Plan of Care  Updates made by Amanda Amanda [name], Amanda Dance,  LCSW since 06/19/2020 12:00 AM    Problem: Maintain My Quality of Amanda [name] through [facility] Amanda [name] [name].   Priority: High    Goal: Maintain My Quality of  Amanda [name] through [facility] Amanda [name].   Start Date: 06/11/2020  Expected End Date: 07/04/2020  This Visit's Progress: On track  Recent Progress: On track  Priority: High  Note:   Current Barriers:   . Patient with Dementia without Behavioral Disturbance, Diabetes Mellitus with Circulatory Complication, History of Falls/Fall Risk, Osteopenia and Hypertension needs support, education, and care coordination to resolve unmet personal care needs in the home. . Patient is unable to self-administer medications as prescribed. . Patient is unable to consistently perform ADL's/IADL's independently. . Level of care concerns. Amanda Amanda [name] knowledge of available community agencies and resources. Clinical Goals:  . Over the next 30 days, patient will have in-home care services in place, through [name] policy with Amanda Amanda [name]. Marland Kitchen LCSW will contact a representative at Amanda Amanda [name] to inquire about patient and husband, Amanda Amanda [name] through Amanda Amanda [name] (I.e List of preferred provider agencies, hours of coverage per day, out-of-pocket expense, etc.).    Marland Kitchen Patient and husband, Amanda Amanda [name] will work with Amanda Amanda [name] and [employer] Amanda [name] to coordinate care in the home.   Marland Kitchen LCSW will obtain a list of preferred provider agencies from Amanda Amanda [name] policy and encourage patient and her husband, Amanda Amanda [name] to decide on at least 4 agencies of interest from that list. . Patient will attend all scheduled medical appointments as evidenced by patient report and care team review of appointment completion in electronic medical record. . Patient will demonstrate improved health management independence as evidenced by having in-home care services in place. Clinical Interventions : . Patient and husband, Amanda Amanda [name] interviewed and appropriate assessments performed. Marland Kitchen Collaboration with patient's Primary Care Physician, Dr. Dale El Amanda [name], regarding development and  update of comprehensive plan of care as evidenced by provider attestation and co-signature. Amanda Amanda [name] care team collaboration (see longitudinal plan of care). . Interventions performed:  Problem Solving/Task Centered, Psychoeducation/Health Education, Quality of Sleep Assessed and Sleep Hygiene Techniques Promoted, Caregiver Stress Acknowledged and Consideration of In-Home Care Services Encouraged. . Provide patient and husband, Amita Atayde with a list of preferred provider agencies and have them provide LCSW with at least 4 agencies of interest, during the next scheduled telephone outreach call. . Discussed plans with patient and husband, Exie Chrismer for ongoing care management follow-up and provided direct contact information for care management team. . Assisted patient and husband, Eri Mcevers with obtaining information about health plan benefits through [facility] and Amanda Amanda [name]. . Provided education to patient and husband, Janae Bonser regarding level of care options. . Assessed needs, level of care concerns, basic eligibility and provided education on in-home care services referral process. . Identified resources and durable medical equipment needed in the home to improve safety and promote independence. Patient Goals/Self-Care Activities:  . Receive assistance from LCSW in making a 3-way call to a representative with [facility] Policy Team [phone]), to inquire about hours of coverage, out-of-pocket expenses, preferred agency providers, etc. . Receive assistance from granddaughter, [name] with regards to arranging in-home care agency provider services. . Review list of preferred agency providers, obtained through [facility], and be prepared to provide LCSW with at least 4 agencies of interest, during the next scheduled telephone outreach call. . Work with LCSW on a weekly  basis until approved for  in-home care services through [facility] Policy. Elray Buba Amanda [name] [employer] Insurance Policy #: [id number]; Issued: 10/04/1996.  Policy coverage is for both patient and husband, Paitlyn Mcclatchey. . Continue to try and perform activities of daily living independently, but with supervision.   Remember: . Always use handrails on the stairs. . Always use your cane or walker when ambulating. . Always wear your glasses, especially at night or in areas not well lit.  Follow Up Plan: LCSW will follow-up with patient and patient's daughter by telephone on 06/28/2020 at 3:00pm.      Follow Up Plan: 06/28/2020 at 3:00pm.      Danford Bad LCSW Licensed Clinical Social Worker [facility] Amanda [name] [place]  [phone]

## 2020-06-19 NOTE — Patient Instructions (Signed)
Visit Information  PATIENT GOALS: Goals Addressed            This Visit's Progress   . Maintain My Quality of Life through [facility].   On track    Timeframe:  Short-Term Goal Priority:  High Start Date:    06/11/2020                      Expected End Date:   07/04/2020         Follow Up Date:  06/28/2020 at 3:00pm.   Patient Goals: . Receive assistance from LCSW in making a 3-way call to a representative with [facility] Policy Team [phone]), to inquire about hours of coverage, out-of-pocket expenses, preferred agency providers, etc. . Receive assistance from granddaughter, [name] with regards to arranging in-home care agency provider services. . Review list of preferred agency providers, obtained through [facility], and be prepared to provide LCSW with at least 4 agencies of interest, during the next scheduled telephone outreach call. . Work with [name] on a weekly basis until approved for in-home care services through [facility]. [employer] Policy #: [id number]; Issued: 10/04/1996.  Policy coverage is for both patient and husband, Livia Tarr. . Continue to try and perform activities of daily living independently, but with supervision.           Patient verbalizes understanding of instructions provided today and agrees to view in MyChart.   Telephone follow up appointment with care management team member scheduled for:  06/28/2020 at 3:00pm.  Danford Bad LCSW Licensed Clinical Social Worker [facility]  [phone]

## 2020-06-28 ENCOUNTER — Ambulatory Visit (INDEPENDENT_AMBULATORY_CARE_PROVIDER_SITE_OTHER): Payer: Medicare PPO | Admitting: *Deleted

## 2020-06-28 ENCOUNTER — Emergency Department
Admission: EM | Admit: 2020-06-28 | Discharge: 2020-06-28 | Disposition: A | Discharge status: Home / Self Care | Payer: Medicare PPO | Attending: Emergency Medicine | Admitting: Emergency Medicine

## 2020-06-28 ENCOUNTER — Encounter: Payer: Self-pay | Admitting: Emergency Medicine

## 2020-06-28 ENCOUNTER — Other Ambulatory Visit: Payer: Self-pay

## 2020-06-28 DIAGNOSIS — I959 Hypotension, unspecified: Secondary | ICD-10-CM | POA: Diagnosis not present

## 2020-06-28 DIAGNOSIS — M549 Dorsalgia, unspecified: Secondary | ICD-10-CM | POA: Diagnosis not present

## 2020-06-28 DIAGNOSIS — Z5321 Procedure and treatment not carried out due to patient leaving prior to being seen by health care provider: Secondary | ICD-10-CM | POA: Insufficient documentation

## 2020-06-28 DIAGNOSIS — E1159 Type 2 diabetes mellitus with other circulatory complications: Secondary | ICD-10-CM

## 2020-06-28 DIAGNOSIS — W19XXXA Unspecified fall, initial encounter: Secondary | ICD-10-CM | POA: Insufficient documentation

## 2020-06-28 DIAGNOSIS — I2510 Atherosclerotic heart disease of native coronary artery without angina pectoris: Secondary | ICD-10-CM | POA: Diagnosis not present

## 2020-06-28 DIAGNOSIS — M545 Low back pain, unspecified: Secondary | ICD-10-CM | POA: Insufficient documentation

## 2020-06-28 DIAGNOSIS — R52 Pain, unspecified: Secondary | ICD-10-CM | POA: Diagnosis not present

## 2020-06-28 DIAGNOSIS — F0390 Unspecified dementia without behavioral disturbance: Secondary | ICD-10-CM | POA: Diagnosis not present

## 2020-06-28 NOTE — ED Triage Notes (Addendum)
Pt to ED via [facility] with c/o fall approx 5 hrs ago, per EMS pt c/o mid/lower back pain, has taken tylenol without relief. Per EMS pt ambulatory with assistance on scene. Per EMS pt with mild dementia. Pt only compliant on arrival is mid/lower back pain.    CBG 145 HR 71 96% RA 139/55

## 2020-06-28 NOTE — Patient Instructions (Signed)
Visit Information  PATIENT GOALS: Goals Addressed            This Visit's Progress   . COMPLETED: Maintain My Quality of Life through [facility].   On track    Timeframe:  Short-Term Goal Priority:  High Start Date:    06/11/2020                      Expected End Date:   06/28/2020         Follow Up Date:  No Follow-Up Required.   Patient Goals: . Receive assistance from LCSW in making a 3-way call to a representative with [facility] Policy Team [phone]), to inquire about hours of coverage, out-of-pocket expenses, preferred agency providers, etc. . Receive assistance from granddaughter, [name] with regards to deciding which in-home care  agency you would like to use from the list provided, paid for through [facility].   [employer] Policy #: [id number]; Issued: 10/04/1996.  Policy coverage is for both patient and husband, Jaid Quirion. . Continue to try and perform activities of daily living independently, but with supervision.    . Contact LCSW directly (# [id number]) if additional social work needs are identified in the near future.        Patient verbalizes understanding of instructions provided today and agrees to view in MyChart.   Telephone follow up appointment with care management team member scheduled for: No Follow-Up Required.  Danford Bad LCSW Licensed Clinical Social Worker [facility]  [phone]

## 2020-06-28 NOTE — Chronic Care Management (AMB) (Signed)
Chronic Care Management    Clinical Social Work Note  06/28/2020 Name: Amanda [name] MRN: [id number] DOB: 1933/02/22  Amanda [name] is a 87 y.o. year old female who is a primary care patient of Amanda [place], MD. The CCM team was consulted to assist the patient with chronic disease management and/or care coordination needs related to: Level of Care Concerns and Caregiver Stress in Patient with Dementia without Behavioral Disturbance, Diabetes Mellitus with Circulatory Complication, History of Falls/Fall Risk, Osteopenia and Hypertension needs support, education, and care coordination to resolve unmet personal care needs in the home.  Engaged with patient's granddaughter, Amanda [name] by telephone for follow up visit in response to provider referral for social work chronic care management and care coordination services.  According to Mrs. Amanda [name], patient presented to the Emergency Department at [name] today around 1:30pm, due to a fall she sustained at home, now complaining of back pain.  Patient is ambulatory with assistance, but unable to obtain relief from Tylenol.  Consent to Services:  The patient was given information about Chronic Care Management services, agreed to services, and gave verbal consent prior to initiation of services.  Please see initial visit note for detailed documentation.   Patient agreed to services and consent obtained.   Assessment: Review of patient past medical history, allergies, medications, and health status, including review of relevant consultants reports was performed today as part of a comprehensive evaluation and provision of chronic care management and care coordination services.     SDOH (Social Determinants of Health) assessments and interventions performed:    Advanced Directives Status: Not addressed in this encounter.  CCM Care Plan  Allergies  Allergen Reactions  . Atorvastatin Other (See Comments)    Muscle Pain  .  Sulfa Antibiotics     Outpatient Encounter Medications as of 06/28/2020  Medication Sig  . aspirin 81 MG chewable tablet Chew 1 tablet (81 mg total) by mouth daily.  . clopidogrel (PLAVIX) 75 MG tablet Take 1 tablet (75 mg total) by mouth daily.  . clopidogrel (PLAVIX) 75 MG tablet Take 1 tablet (75 mg total) by mouth daily with breakfast.  . cyanocobalamin (,VITAMIN B-12,) 1000 MCG/ML injection Inject 1,000 mcg (75mL) into the muscle once a week for 4 weeks and then once every 30 days.  Marland Kitchen donepezil (ARICEPT) 10 MG tablet TAKE ONE TABLET AT BEDTIME.  Marland Kitchen levothyroxine (SYNTHROID) 75 MCG tablet TAKE ONE-HALF TABLET EVERY DAY  . lisinopril (ZESTRIL) 20 MG tablet TAKE (1) TABLET BY MOUTH EVERY DAY  . metoprolol tartrate (LOPRESSOR) 25 MG tablet TAKE (1) TABLET BY MOUTH TWICE DAILY  . Syringe/Needle, Disp, (SYRINGE 3CC/25GX1") 25G X 1" 3 ML MISC Use as directed to administer b12 injections.   No facility-administered encounter medications on file as of 06/28/2020.    Patient Active Problem List   Diagnosis Date Noted  . Renal cyst 02/01/2019  . Anemia 02/01/2019  . Acute ST elevation myocardial infarction (STEMI) of inferior wall (HCC)   . STEMI involving right coronary artery (HCC) 01/21/2019  . Unstable angina pectoris (HCC) 01/19/2019  . Abdominal bruit 09/14/2017  . Muscle ache 03/06/2017  . Fall 12/31/2014  . Health care maintenance 05/20/2014  . Diabetes mellitus with circulatory complication (HCC) 05/17/2013  . CAD (coronary artery disease) 05/15/2012  . Osteopenia 12/14/2011  . Hypertension, essential 11/13/2011  . Hypercholesteremia 11/13/2011  . Dementia without behavioral disturbance (HCC) 11/13/2011  . Hypothyroidism 11/13/2011    Conditions to be addressed/monitored: Patient with  Dementia without Behavioral Disturbance, Diabetes Mellitus with Circulatory Complication, History of Falls/Fall Risk, Osteopenia and Hypertension needs support, education, and care coordination to  resolve unmet personal care needs in the home. Limited Social Support, Level of Care Concerns, ADL/IADL Limitations, Mental Health Concerns, Social Isolation, Limited Access to Caregiver, Cognitive Deficits and Memory Deficits.  Care Plan : LCSW Plan of Care  Updates made by Amanda Stamps, LCSW since 06/28/2020 12:00 AM    Problem: Maintain My Quality of [name] through Amanda [name]. Resolved 06/28/2020  Priority: Amanda    Goal: Maintain My Quality of [name] through Amanda [name]. Completed 06/28/2020  Start Date: 06/11/2020  Expected End Date: 06/28/2020  This Visit's Progress: On track  Recent Progress: On track  Priority: Amanda  Note:   Current Barriers:   . Patient with Dementia without Behavioral Disturbance, Diabetes Mellitus with Circulatory Complication, History of Falls/Fall Risk, Osteopenia and Hypertension needs support, education, and care coordination to resolve unmet personal care needs in the home. . Patient is unable to self-administer medications as prescribed. . Patient is unable to consistently perform ADL's/IADL's independently. . Level of care concerns. Darylene Price knowledge of available community agencies and resources. Clinical Goals:  . Over the next 30 days, patient will have in-home care services in place, through Amanda [name] policy with Amanda [name]. Marland Kitchen LCSW will contact a representative at Amanda [name] to inquire about patient and husband, Amanda [name] through Amanda [name] (I.e List of preferred provider agencies, hours of coverage per day, out-of-pocket expense, etc.).    Marland Kitchen Patient and husband, Amanda [name] will work with Amanda [name] and Amanda [name] to coordinate care in the home.   Marland Kitchen LCSW will obtain a list of preferred provider agencies from Amanda [name] policy and encourage patient and her husband, Amanda [name] to decide on at least 4 agencies of interest from that list. . Patient will  attend all scheduled medical appointments as evidenced by patient report and care team review of appointment completion in electronic medical record. . Patient will demonstrate improved health management independence as evidenced by having in-home care services in place. Clinical Interventions: . Patient and husband, Stephania Macfarlane interviewed and appropriate assessments performed. Marland Kitchen Collaboration with patient's Primary Care Physician, Dr. Dale Deepwater, regarding development and update of comprehensive plan of care as evidenced by provider attestation and co-signature. Rene Paci care team collaboration (see longitudinal plan of care). . Interventions performed:  Problem Solving/Task Centered, Psychoeducation/Health Education, Quality of Sleep Assessed and Sleep Hygiene Techniques Promoted, Caregiver Stress Acknowledged and Consideration of In-Home Care Services Encouraged. . Provide patient and husband, Sheyli Horwitz with a list of preferred provider agencies and have them provide LCSW with at least 4 agencies of interest, during the next scheduled telephone outreach call. . Discussed plans with patient and husband, Jarrod Bodkins for ongoing care management follow-up and provided direct contact information for care management team. . Assisted patient and husband, Tychelle Purkey with obtaining information about health plan benefits through [facility] and Amanda [name]. . Provided education to patient and husband, Loria Lacina regarding level of care options. . Assessed needs, level of care concerns, basic eligibility and provided education on in-home care services referral process. . Identified resources and durable medical equipment needed in the home to improve safety and promote independence. Patient Goals/Self-Care Activities:  . Receive assistance from LCSW in making a 3-way call to a representative with [facility] Team [phone]),  to inquire  about hours of coverage, out-of-pocket expenses, preferred agency providers, etc. . Receive assistance from granddaughter, Amanda [name] with regards to deciding which in-home care  agency you would like to use from the list provided, paid for through [name].   [name] Policy #: [id number]; Issued: 10/04/1996.  Policy coverage is for both patient and husband, Ami Mally. . Continue to try and perform activities of daily living independently, but with supervision.    . Contact LCSW directly (# [id number]) if additional social work needs are identified in the near future.  Remember: . Always use handrails on the stairs. . Always use your cane or walker when ambulating. . Always wear your glasses, especially at night or in areas not well lit.  Follow-Up Plan:  No Follow-Up Required.      Follow Up Plan: No Follow-Up Required.      Danford Bad LCSW Licensed Clinical Social Worker [facility]  [phone]

## 2020-07-02 DIAGNOSIS — Z961 Presence of intraocular lens: Secondary | ICD-10-CM | POA: Diagnosis not present

## 2020-07-04 DIAGNOSIS — I25118 Atherosclerotic heart disease of native coronary artery with other forms of angina pectoris: Secondary | ICD-10-CM | POA: Diagnosis not present

## 2020-07-11 DIAGNOSIS — I2510 Atherosclerotic heart disease of native coronary artery without angina pectoris: Secondary | ICD-10-CM | POA: Diagnosis not present

## 2020-07-11 DIAGNOSIS — I6523 Occlusion and stenosis of bilateral carotid arteries: Secondary | ICD-10-CM | POA: Diagnosis not present

## 2020-07-11 DIAGNOSIS — E782 Mixed hyperlipidemia: Secondary | ICD-10-CM | POA: Diagnosis not present

## 2020-07-11 DIAGNOSIS — I10 Essential (primary) hypertension: Secondary | ICD-10-CM | POA: Diagnosis not present

## 2020-07-16 ENCOUNTER — Other Ambulatory Visit: Payer: Self-pay | Admitting: Internal Medicine

## 2020-07-18 ENCOUNTER — Emergency Department
Admission: EM | Admit: 2020-07-18 | Discharge: 2020-07-18 | Disposition: A | Discharge status: Home / Self Care | Payer: Medicare PPO | Attending: Emergency Medicine | Admitting: Emergency Medicine

## 2020-07-18 ENCOUNTER — Other Ambulatory Visit: Payer: Self-pay

## 2020-07-18 DIAGNOSIS — I2510 Atherosclerotic heart disease of native coronary artery without angina pectoris: Secondary | ICD-10-CM | POA: Diagnosis not present

## 2020-07-18 DIAGNOSIS — R42 Dizziness and giddiness: Secondary | ICD-10-CM | POA: Diagnosis not present

## 2020-07-18 DIAGNOSIS — I10 Essential (primary) hypertension: Secondary | ICD-10-CM | POA: Insufficient documentation

## 2020-07-18 DIAGNOSIS — F0390 Unspecified dementia without behavioral disturbance: Secondary | ICD-10-CM | POA: Insufficient documentation

## 2020-07-18 DIAGNOSIS — Z7982 Long term (current) use of aspirin: Secondary | ICD-10-CM | POA: Insufficient documentation

## 2020-07-18 DIAGNOSIS — Z7902 Long term (current) use of antithrombotics/antiplatelets: Secondary | ICD-10-CM | POA: Diagnosis not present

## 2020-07-18 DIAGNOSIS — E119 Type 2 diabetes mellitus without complications: Secondary | ICD-10-CM | POA: Insufficient documentation

## 2020-07-18 DIAGNOSIS — Z79899 Other long term (current) drug therapy: Secondary | ICD-10-CM | POA: Insufficient documentation

## 2020-07-18 DIAGNOSIS — E039 Hypothyroidism, unspecified: Secondary | ICD-10-CM | POA: Insufficient documentation

## 2020-07-18 DIAGNOSIS — I959 Hypotension, unspecified: Secondary | ICD-10-CM | POA: Diagnosis not present

## 2020-07-18 LAB — CBC
HCT: 39.6 % (ref 36.0–46.0)
Hemoglobin: 12.9 g/dL (ref 12.0–15.0)
MCH: 29.2 pg (ref 26.0–34.0)
MCHC: 32.6 g/dL (ref 30.0–36.0)
MCV: 89.6 fL (ref 80.0–100.0)
Platelets: 313 10*3/uL (ref 150–400)
RBC: 4.42 MIL/uL (ref 3.87–5.11)
RDW: 13.5 % (ref 11.5–15.5)
WBC: 8.4 10*3/uL (ref 4.0–10.5)
nRBC: 0 % (ref 0.0–0.2)

## 2020-07-18 LAB — BASIC METABOLIC PANEL
Anion gap: 7 (ref 5–15)
BUN: 18 mg/dL (ref 8–23)
CO2: 26 mmol/L (ref 22–32)
Calcium: 9.5 mg/dL (ref 8.9–10.3)
Chloride: 105 mmol/L (ref 98–111)
Creatinine, Ser: 1 mg/dL (ref 0.44–1.00)
GFR, Estimated: 55 mL/min — ABNORMAL LOW (ref >60)
Glucose, Bld: 148 mg/dL — ABNORMAL HIGH (ref 70–99)
Potassium: 4.6 mmol/L (ref 3.5–5.1)
Sodium: 138 mmol/L (ref 135–145)

## 2020-07-18 LAB — URINALYSIS, COMPLETE (UACMP) WITH MICROSCOPIC
Bilirubin Urine: NEGATIVE
Glucose, UA: NEGATIVE mg/dL
Hgb urine dipstick: NEGATIVE
Ketones, ur: NEGATIVE mg/dL
Nitrite: NEGATIVE
Protein, ur: NEGATIVE mg/dL
Specific Gravity, Urine: 1.019 (ref 1.005–1.030)
pH: 8 (ref 5.0–8.0)

## 2020-07-18 LAB — CBG MONITORING, ED: Glucose-Capillary: 121 mg/dL — ABNORMAL HIGH (ref 70–99)

## 2020-07-18 NOTE — TOC Transition Note (Addendum)
Transition of Care [facility]) - CM/SW Discharge Note   Patient Details  Name: Amanda [name] MRN: [id number] Date of Birth: 1933-09-09  Transition of [facility] CM/SW Contact:  Marina Goodell Phone Number: [phone] 07/18/2020, 3:43 PM   Clinical Narrative:     Patient presents to [facility] due to dizziness.  Patient is with Amanda [name], Amanda [name] (Spouse) [phone] who is concerned about the patient's increased forgetfulness and irration due to her dementia.  Mr. Watling stated he is increasingly unable to meet the patient's care needs without help.  CSW spoke with the patient's granddaughter Irving Burton who stated the patient has long term care insurance and the insurance will cover a home caregiver if a physician states there is need for a caregiver.   Patient will discharge home.  Patient has appointment with PCP on July 6th, 2022.         Patient Goals and CMS Choice        Discharge Placement                       Discharge Plan and Services                                     Social Determinants of Health (SDOH) Interventions     Readmission Risk Interventions No flowsheet data found.

## 2020-07-18 NOTE — ED Triage Notes (Signed)
Pt here with via [facility] from home with dizziness that started this morning. 100/50,16,97% on RA. Cbg 211. Hx of hyperthyroid and dementia.

## 2020-07-18 NOTE — ED Provider Notes (Signed)
[facility] Emergency Department Provider Note  Time seen: 9:33 AM  I have reviewed the triage vital signs and the nursing notes.   HISTORY  Chief Complaint Dizziness   HPI Amanda [name] is a 87 y.o. female with a past medical history of dementia, hypertension, hyperlipidemia presents from home for dizziness.  According to report patient was complaining of dizziness this morning so family called EMS to bring the patient to the emergency department for evaluation.  Here the patient is calm cooperative and pleasant.  When asked why she is here today she states pleasantly that she does not know.  Patient denies any symptoms currently including any dizziness.  Patient has dementia and cannot contribute much to her history or review of systems currently.  Awaiting family arrival for further history.   Past Medical History:  Diagnosis Date   Carotid bruit    bilaterally   Dementia (HCC)    Hypercholesterolemia    Hypertension    Hypothyroidism     Patient Active Problem List   Diagnosis Date Noted   Renal cyst 02/01/2019   Anemia 02/01/2019   Acute ST elevation myocardial infarction (STEMI) of inferior wall [facility])    STEMI involving right coronary artery (HCC) 01/21/2019   Unstable angina pectoris (HCC) 01/19/2019   Abdominal bruit 09/14/2017   Muscle ache 03/06/2017   Fall 12/31/2014   Health care maintenance 05/20/2014   Diabetes mellitus with circulatory complication (HCC) 05/17/2013   CAD (coronary artery disease) 05/15/2012   Osteopenia 12/14/2011   Hypertension, essential 11/13/2011   Hypercholesteremia 11/13/2011   Dementia without behavioral disturbance (HCC) 11/13/2011   Hypothyroidism 11/13/2011    Past Surgical History:  Procedure Laterality Date   CARPAL TUNNEL RELEASE     CHOLECYSTECTOMY  1991   CORONARY/GRAFT ACUTE MI REVASCULARIZATION N/A 01/21/2019   Procedure: Coronary/Graft Acute MI Revascularization;  Surgeon: Alwyn Pea,  MD;  Location: [facility] INVASIVE CV LAB;  Service: Cardiovascular;  Laterality: N/A;   LEFT HEART CATH AND CORONARY ANGIOGRAPHY N/A 01/21/2019   Procedure: LEFT HEART CATH AND CORONARY ANGIOGRAPHY;  Surgeon: Alwyn Pea, MD;  Location: [facility] INVASIVE CV LAB;  Service: Cardiovascular;  Laterality: N/A;   THORACIC AORTA STENT  12/13   heart stent    Prior to Admission medications   Medication Sig Start Date End Date Taking? Authorizing Provider  aspirin 81 MG chewable tablet Chew 1 tablet (81 mg total) by mouth daily. 01/25/19   Lynn Ito, MD  clopidogrel (PLAVIX) 75 MG tablet Take 1 tablet (75 mg total) by mouth daily. 01/22/19   Lynn Ito, MD  clopidogrel (PLAVIX) 75 MG tablet Take 1 tablet (75 mg total) by mouth daily with breakfast. 02/01/19   Dale [place], MD  cyanocobalamin (,VITAMIN B-12,) 1000 MCG/ML injection Inject 1,000 mcg (56mL) into the muscle once a week for 4 weeks and then once every 30 days. 06/07/19   Dale LaMoure, MD  donepezil (ARICEPT) 10 MG tablet TAKE ONE TABLET AT BEDTIME. 04/30/20   Dale [place], MD  levothyroxine (SYNTHROID) 75 MCG tablet TAKE ONE-HALF TABLET EVERY DAY 04/16/20   Dale [place], MD  lisinopril (ZESTRIL) 20 MG tablet TAKE (1) TABLET BY MOUTH EVERY DAY 07/16/20   Dale [place], MD  metoprolol tartrate (LOPRESSOR) 25 MG tablet TAKE (1) TABLET BY MOUTH TWICE DAILY 01/04/20   Dale [place], MD  Syringe/Needle, Disp, (SYRINGE 3CC/25GX1") 25G X 1" 3 ML MISC Use as directed to administer b12 injections. 06/07/19   Dale [place], MD  Allergies  Allergen Reactions   Atorvastatin Other (See Comments)    Muscle Pain   Sulfa Antibiotics     Family History  Problem Relation Age of Onset   Hypertension Father    Lung cancer Father    Hypertension Mother    Dementia Mother    Arthritis Other    Breast cancer Cousin        mat cousin   Colon cancer Neg Hx     Social History Social History   Tobacco Use   Smoking status: Never    Smokeless tobacco: Never  Vaping Use   Vaping Use: Never used  Substance Use Topics   Alcohol use: No    Alcohol/week: 0.0 standard drinks   Drug use: No    Review of Systems Unable to obtain an adequate/accurate review of systems secondary to baseline dementia. ____________________________________________   PHYSICAL EXAM:  VITAL SIGNS: ED Triage Vitals  Enc Vitals Group     BP 07/18/20 0918 (!) 129/43     Pulse Rate 07/18/20 0918 (!) 58     Resp 07/18/20 0918 18     Temp 07/18/20 0918 97.7 F (36.5 C)     Temp Source 07/18/20 0918 Oral     SpO2 07/18/20 0918 100 %     Weight 07/18/20 0919 150 lb (68 kg)     Height 07/18/20 0919 5' (1.524 m)     Head Circumference --      Peak Flow --      Pain Score 07/18/20 0919 0     Pain Loc --      Pain Edu? --      Excl. in GC? --    Constitutional: Patient is awake and alert, no acute distress. Eyes: Normal exam ENT      Head: Normocephalic and atraumatic.      Mouth/Throat: Mucous membranes are moist. Cardiovascular: Normal rate, regular rhythm. Respiratory: Normal respiratory effort without tachypnea nor retractions. Breath sounds are clear Gastrointestinal: Soft and nontender. No distention.  Musculoskeletal: Nontender with normal range of motion in all extremities.  Neurologic:  Normal speech and language. No gross focal neurologic deficits  Skin:  Skin is warm, dry and intact.  Psychiatric: Mood and affect are normal.   ____________________________________________    EKG  EKG viewed and interpreted by myself shows a normal sinus rhythm at 54 bpm with a narrow QRS, normal axis, normal intervals with nonspecific ST changes.  ____________________________________________    INITIAL IMPRESSION / ASSESSMENT AND PLAN / ED COURSE  Pertinent labs & imaging results that were available during my care of the patient were reviewed by me and considered in my medical decision making (see chart for details).   Patient  presents to the emergency department for reported dizziness at home.  Currently the patient appears well she has no complaints, reassuring physical exam.  She is calm cooperative and pleasant.  Given the report of dizziness this morning we will check labs including urinalysis EKG and continue to closely monitor.  Patient's labs are largely nonrevealing.  Urinalysis remains pending.  Patient's husband is now here he states he is really helping with help caring for the patient at home as she has advanced dementia.  We will consult social work to see if they can help with home health.  Continue to await social work evaluation for possible home health.  Patient care signed out to oncoming provider.  Amanda [name] was evaluated in Emergency Department on 07/18/2020 for the  symptoms described in the history of present illness. She was evaluated in the context of the global COVID-19 pandemic, which necessitated consideration that the patient might be at risk for infection with the SARS-CoV-2 virus that causes COVID-19. Institutional protocols and algorithms that pertain to the evaluation of patients at risk for COVID-19 are in a state of rapid change based on information released by regulatory bodies including the CDC and federal and state organizations. These policies and algorithms were followed during the patient's care in the ED.  ____________________________________________   FINAL CLINICAL IMPRESSION(S) / ED DIAGNOSES  Dizziness   Minna Antis, MD 07/18/20 1419

## 2020-07-18 NOTE — ED Provider Notes (Signed)
-----------------------------------------   3:16 PM on 07/18/2020 ----------------------------------------- Patient has been seen by social work in the emergency department.  Patient appears to have advanced dementia as evidenced by my evaluation.  Patient would greatly benefit from additional care at home as the husband is also elderly.  It is my clinical opinion that the patient is in need of home health care.   Minna Antis, MD 07/18/20 1517

## 2020-08-01 ENCOUNTER — Encounter: Payer: Self-pay | Admitting: Internal Medicine

## 2020-08-01 ENCOUNTER — Other Ambulatory Visit: Payer: Self-pay

## 2020-08-01 ENCOUNTER — Ambulatory Visit: Payer: Medicare PPO | Admitting: Internal Medicine

## 2020-08-01 DIAGNOSIS — E039 Hypothyroidism, unspecified: Secondary | ICD-10-CM

## 2020-08-01 DIAGNOSIS — E1159 Type 2 diabetes mellitus with other circulatory complications: Secondary | ICD-10-CM

## 2020-08-01 DIAGNOSIS — W19XXXD Unspecified fall, subsequent encounter: Secondary | ICD-10-CM | POA: Diagnosis not present

## 2020-08-01 DIAGNOSIS — I10 Essential (primary) hypertension: Secondary | ICD-10-CM | POA: Diagnosis not present

## 2020-08-01 DIAGNOSIS — F0390 Unspecified dementia without behavioral disturbance: Secondary | ICD-10-CM

## 2020-08-01 DIAGNOSIS — I2510 Atherosclerotic heart disease of native coronary artery without angina pectoris: Secondary | ICD-10-CM | POA: Diagnosis not present

## 2020-08-01 DIAGNOSIS — E7800 Pure hypercholesterolemia, unspecified: Secondary | ICD-10-CM

## 2020-08-01 DIAGNOSIS — I700 Atherosclerosis of aorta: Secondary | ICD-10-CM | POA: Diagnosis not present

## 2020-08-01 NOTE — Progress Notes (Addendum)
Patient ID: Amanda [name], female   DOB: 09-May-1933, 87 y.o.   MRN: [id number]   Subjective:    Patient ID: Amanda [name], female    DOB: 1933/07/18, 87 y.o.   MRN: [id number]  HPI This visit occurred during the SARS-CoV-2 public health emergency.  Safety protocols were in place, including screening questions prior to the visit, additional usage of staff PPE, and extensive cleaning of exam room while observing appropriate contact time as indicated for disinfecting solutions.   Patient here for a scheduled follow up.  She is accompanied by her grandson, husband and daughter-n- Sports coach.  History obtained from all of them.  Was seen recently for fall.  Also evaluated 07/18/20 - for dizziness.  Labs unrevealing. Social work consulted.  Husband informs me they have [name] - home care insurance.  States needs neurology evaluation.  Discussed given persistent dementia and recent fall and emergency room visits, I do feel she would benefit from increased help/care in the home.  Discussed with family today.  Amanda [name] denies any chest pain or sob.  Eating.  No nausea or vomiting.  They do need help with cooking, cleaning and daily activities.  No residual injury from her recent fall.  Has a healing abrasion left elbow.  Denies any abdominal pain or bowel change.    Past Medical History:  Diagnosis Date   Carotid bruit    bilaterally   Dementia ([place])    Hypercholesterolemia    Hypertension    Hypothyroidism    Past Surgical History:  Procedure Laterality Date   CARPAL TUNNEL RELEASE     CHOLECYSTECTOMY  1991   CORONARY/GRAFT ACUTE MI REVASCULARIZATION N/A 01/21/2019   Procedure: Coronary/Graft Acute MI Revascularization;  Surgeon: Yolonda Kida, MD;  Location: [facility];  Service: Cardiovascular;  Laterality: N/A;   LEFT HEART CATH AND CORONARY ANGIOGRAPHY N/A 01/21/2019   Procedure: LEFT HEART CATH AND CORONARY ANGIOGRAPHY;  Surgeon: Yolonda Kida, MD;  Location: [facility];  Service: Cardiovascular;  Laterality: N/A;   THORACIC AORTA STENT  12/13   heart stent   Family History  Problem Relation Age of Onset   Hypertension Father    Lung cancer Father    Hypertension Mother    Dementia Mother    Arthritis Other    Breast cancer Cousin        mat cousin   Colon cancer Neg Hx    Social History   Socioeconomic History   Marital status: Married    Spouse name: Amanda [name]   Number of children: 2   Years of education: 16   Highest education level: Bachelor's degree (e.g., BA, AB, BS)  Occupational History   Occupation: Retired Education officer, museum  Tobacco Use   Smoking status: Never   Smokeless tobacco: Never  Vaping Use   Vaping Use: Never used  Substance and Sexual Activity   Alcohol use: No    Alcohol/week: 0.0 standard drinks   Drug use: No   Sexual activity: Not Currently  Other Topics Concern   Not on file  Social History Narrative   She is an only child. 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   Social Determinants of Health   Financial Resource Strain: Low Risk    Difficulty of Paying Living Expenses: Not hard at all  Food Insecurity: No Food Insecurity   Worried About Charity fundraiser in the Last Year: Never  true   Ran Out of Food in the Last Year: Never true  Transportation Needs: No Transportation Needs   Lack of Transportation (Medical): No   Lack of Transportation (Non-Medical): No  Physical Activity: Inactive   Days of Exercise per Week: 0 days   Minutes of Exercise per Session: 0 min  Stress: No Stress Concern Present   Feeling of Stress : Not at all  Social Connections: Moderately Isolated   Frequency of Communication with Friends and Family: More than three times a week   Frequency of Social Gatherings with Friends and Family: More than three times a week   Attends Religious Services: Never   Marine scientist or Organizations: No   Attends Archivist Meetings:  Never   Marital Status: Married    Review of Systems  Constitutional:  Negative for appetite change and unexpected weight change.  HENT:  Negative for congestion and sinus pressure.   Respiratory:  Negative for cough, chest tightness and shortness of breath.   Cardiovascular:  Negative for chest pain, palpitations and leg swelling.  Gastrointestinal:  Negative for abdominal pain, diarrhea, nausea and vomiting.  Genitourinary:  Negative for difficulty urinating and dysuria.  Musculoskeletal:  Negative for joint swelling and myalgias.  Skin:  Negative for color change and rash.  Neurological:  Negative for dizziness and headaches.       Persistent dementia   Psychiatric/Behavioral:  Negative for agitation and dysphoric mood.       Objective:    Physical Exam Vitals reviewed.  Constitutional:      General: She is not in acute distress.    Appearance: Normal appearance.  HENT:     Head: Normocephalic and atraumatic.     Right Ear: External ear normal.     Left Ear: External ear normal.  Eyes:     General: No scleral icterus.       Right eye: No discharge.        Left eye: No discharge.     Conjunctiva/sclera: Conjunctivae normal.  Neck:     Thyroid: No thyromegaly.  Cardiovascular:     Rate and Rhythm: Normal rate and regular rhythm.  Pulmonary:     Effort: No respiratory distress.     Breath sounds: Normal breath sounds. No wheezing.  Abdominal:     General: Bowel sounds are normal.     Palpations: Abdomen is soft.     Tenderness: There is no abdominal tenderness.  Musculoskeletal:        General: No swelling or tenderness.     Cervical back: Neck supple. No tenderness.  Lymphadenopathy:     Cervical: No cervical adenopathy.  Skin:    Findings: No erythema or rash.     Comments: Healing abrasion - left elbow.   Neurological:     Mental Status: She is alert.  Psychiatric:        Mood and Affect: Mood normal.        Behavior: Behavior normal.    BP 120/70    Pulse 65   Temp (!) 96 F (35.6 C)   Resp 16   Ht $R'5\' 3"'LY$  (1.6 m)   Wt 167 lb (75.8 kg)   SpO2 98%   BMI 29.58 kg/m  Wt Readings from Last 3 Encounters:  08/01/20 167 lb (75.8 kg)  07/18/20 150 lb (68 kg)  06/28/20 170 lb (77.1 kg)    Outpatient Encounter Medications as of 08/01/2020  Medication Sig   aspirin 81 MG  chewable tablet Chew 1 tablet (81 mg total) by mouth daily.   clopidogrel (PLAVIX) 75 MG tablet Take 1 tablet (75 mg total) by mouth daily with breakfast.   donepezil (ARICEPT) 10 MG tablet TAKE ONE TABLET AT BEDTIME.   levothyroxine (SYNTHROID) 75 MCG tablet TAKE ONE-HALF TABLET EVERY DAY   lisinopril (ZESTRIL) 20 MG tablet TAKE (1) TABLET BY MOUTH EVERY DAY   metoprolol tartrate (LOPRESSOR) 25 MG tablet TAKE (1) TABLET BY MOUTH TWICE DAILY   Syringe/Needle, Disp, (SYRINGE 3CC/25GX1") 25G X 1" 3 ML MISC Use as directed to administer b12 injections.   [DISCONTINUED] cyanocobalamin (,VITAMIN B-12,) 1000 MCG/ML injection Inject 1,000 mcg (27mL) into the muscle once a week for 4 weeks and then once every 30 days. (Patient not taking: Reported on 07/18/2020)   No facility-administered encounter medications on file as of 08/01/2020.     Lab Results  Component Value Date   WBC 8.4 07/18/2020   HGB 12.9 07/18/2020   HCT 39.6 07/18/2020   PLT 313 07/18/2020   GLUCOSE 148 (H) 07/18/2020   CHOL 260 (H) 05/25/2020   TRIG 305 (H) 05/25/2020   HDL 36 (L) 05/25/2020   LDLDIRECT 164.0 10/12/2019   LDLCALC 172 (H) 05/25/2020   ALT 6 05/25/2020   AST 11 05/25/2020   NA 138 07/18/2020   K 4.6 07/18/2020   CL 105 07/18/2020   CREATININE 1.00 07/18/2020   BUN 18 07/18/2020   CO2 26 07/18/2020   TSH 2.26 10/12/2019   INR 1.0 01/21/2019   HGBA1C 6.5 (H) 05/25/2020   MICROALBUR 0.3 05/25/2020       Assessment & Plan:   Problem List Items Addressed This Visit     Aortic atherosclerosis ([place])    Had intolerance to statin medication.  Continue current blood pressure medication.          CAD (coronary artery disease)    Known CAD.  S/p DES - RCA.  Continue plavix.  Sees cardiology.  Intolerant to statin medication.  Stable.  Follow.         Dementia without behavioral disturbance ([place])    Continues on aricept.  Husband reported worsening memory issues.  Have discussed addition of namenda.  Request formal neurology evaluation.  Social work - [place] - recent fall, ER visits.  Needs help in the home.  Has [employer] policy for home care.  D/w social work regarding resources available. Order placed for neurology referral.  Contact person - grandson Delfino Lovett [phone]) and granddaughter:  Gustavus Messing [phone].         Relevant Orders   Ambulatory referral to Neurology   Diabetes mellitus with circulatory complication ([place])    On no medication.  Low carb diet and exercise.  Follow met b and a1c.         Fall    S/p recent fall as outlined.  Healing abrasion left elbow.  No head injury, etc. As above, see what resources available for home care and assistance.         Hypercholesteremia    Intolerant to statin medication.  Not taking zetia.  Low cholesterol diet and exercise.  Follow lipid panel.        Hypertension, essential    Continue lisinopril and metoprolol.  Blood pressure doing well.  Follow pressures.  Follow metabolic panel.        Hypothyroidism    On thyroid replacement.  Follow tsh.  Liridona Mashaw, MD  

## 2020-08-02 ENCOUNTER — Encounter: Payer: Self-pay | Admitting: Internal Medicine

## 2020-08-02 NOTE — Assessment & Plan Note (Signed)
Known CAD.  S/p DES - RCA.  Continue plavix.  Sees cardiology.  Intolerant to statin medication.  Stable.  Follow.   

## 2020-08-02 NOTE — Assessment & Plan Note (Signed)
On thyroid replacement.  Follow tsh.  

## 2020-08-02 NOTE — Assessment & Plan Note (Signed)
On no medication.  Low carb diet and exercise.  Follow met b and a1c.   

## 2020-08-02 NOTE — Assessment & Plan Note (Addendum)
Continues on aricept.  Husband reported worsening memory issues.  Have discussed addition of namenda.  Request formal neurology evaluation.  Social work - Administrator, sports - recent fall, ER visits.  Needs help in the home.  Has [employer] policy for home care.  D/w social work regarding resources available. Order placed for neurology referral.  Contact person - grandson Gerlene Burdock [phone]) and granddaughter:  [name] [phone].

## 2020-08-02 NOTE — Assessment & Plan Note (Signed)
S/p recent fall as outlined.  Healing abrasion left elbow.  No head injury, etc. As above, see what resources available for home care and assistance.

## 2020-08-02 NOTE — Assessment & Plan Note (Signed)
Intolerant to statin medication.  Not taking zetia.  Low cholesterol diet and exercise.  Follow lipid panel.  

## 2020-08-02 NOTE — Assessment & Plan Note (Signed)
Continue lisinopril and metoprolol.  Blood pressure doing well.  Follow pressures.  Follow metabolic panel.

## 2020-08-03 ENCOUNTER — Ambulatory Visit (INDEPENDENT_AMBULATORY_CARE_PROVIDER_SITE_OTHER): Payer: Medicare PPO | Admitting: *Deleted

## 2020-08-03 ENCOUNTER — Telehealth: Payer: Self-pay | Admitting: Internal Medicine

## 2020-08-03 DIAGNOSIS — I2510 Atherosclerotic heart disease of native coronary artery without angina pectoris: Secondary | ICD-10-CM

## 2020-08-03 DIAGNOSIS — F0390 Unspecified dementia without behavioral disturbance: Secondary | ICD-10-CM

## 2020-08-03 DIAGNOSIS — I700 Atherosclerosis of aorta: Secondary | ICD-10-CM | POA: Insufficient documentation

## 2020-08-03 DIAGNOSIS — I10 Essential (primary) hypertension: Secondary | ICD-10-CM | POA: Diagnosis not present

## 2020-08-03 DIAGNOSIS — E039 Hypothyroidism, unspecified: Secondary | ICD-10-CM | POA: Diagnosis not present

## 2020-08-03 DIAGNOSIS — W19XXXD Unspecified fall, subsequent encounter: Secondary | ICD-10-CM

## 2020-08-03 DIAGNOSIS — E1159 Type 2 diabetes mellitus with other circulatory complications: Secondary | ICD-10-CM

## 2020-08-03 DIAGNOSIS — W19XXXA Unspecified fall, initial encounter: Secondary | ICD-10-CM

## 2020-08-03 NOTE — Chronic Care Management (AMB) (Addendum)
Chronic Care Management    Clinical Social Work Note  08/03/2020 Name: Amanda [name] MRN: [id number] DOB: 06/29/1933  Amanda [name] is a 87 y.o. year old female who is a primary care patient of Amanda Sea Girt, MD. The CCM team was consulted to assist the patient with chronic disease management and/or care coordination needs related to: Amanda [name], Level of Care Concerns, and Caregiver Stress.   Engaged with patient by telephone for follow up visit in response to provider referral for social work chronic care management and care coordination services.   Consent to Services:  The patient was given information about Chronic Care Management services, agreed to services, and gave verbal consent prior to initiation of services.  Please see initial visit note for detailed documentation.   Patient agreed to services and consent obtained.   Assessment: Review of patient past medical history, allergies, medications, and health status, including review of relevant consultants reports was performed today as part of a comprehensive evaluation and provision of chronic care management and care coordination services.     SDOH (Social Determinants of Health) assessments and interventions performed:    Advanced Directives Status: Not addressed in this encounter.  CCM Care Plan  Allergies  Allergen Reactions   Atorvastatin Other (See Comments)    Muscle Pain   Sulfa Antibiotics     Outpatient Encounter Medications as of 08/03/2020  Medication Sig   aspirin 81 MG chewable tablet Chew 1 tablet (81 mg total) by mouth daily.   clopidogrel (PLAVIX) 75 MG tablet Take 1 tablet (75 mg total) by mouth daily with breakfast.   donepezil (ARICEPT) 10 MG tablet TAKE ONE TABLET AT BEDTIME.   levothyroxine (SYNTHROID) 75 MCG tablet TAKE ONE-HALF TABLET EVERY DAY   lisinopril (ZESTRIL) 20 MG tablet TAKE (1) TABLET BY MOUTH EVERY DAY   metoprolol tartrate (LOPRESSOR) 25 MG tablet TAKE (1) TABLET BY MOUTH  TWICE DAILY   Syringe/Needle, Disp, (SYRINGE 3CC/25GX1") 25G X 1" 3 ML MISC Use as directed to administer b12 injections.   No facility-administered encounter medications on file as of 08/03/2020.    Patient Active Problem List   Diagnosis Date Noted   Aortic atherosclerosis (HCC) 08/03/2020   Renal cyst 02/01/2019   Anemia 02/01/2019   Acute ST elevation myocardial infarction (STEMI) of inferior wall [facility])    STEMI involving right coronary artery (HCC) 01/21/2019   Unstable angina pectoris (HCC) 01/19/2019   Abdominal bruit 09/14/2017   Muscle ache 03/06/2017   Fall 12/31/2014   Health care maintenance 05/20/2014   Diabetes mellitus with circulatory complication (HCC) 05/17/2013   CAD (coronary artery disease) 05/15/2012   Osteopenia 12/14/2011   Hypertension, essential 11/13/2011   Hypercholesteremia 11/13/2011   Dementia without behavioral disturbance (HCC) 11/13/2011   Hypothyroidism 11/13/2011    Conditions to be addressed/monitored: Dementia. Limited Social Support, Level of Care Concerns, ADL/IADL Limitations, Limited Access to Caregiver, Cognitive Deficits, and Memory Deficits.  Care Plan : LCSW Plan of Care  Updates made by Amanda Stamps, LCSW since 08/03/2020 12:00 AM     Problem: Maintain My Quality of [name] through [name].   Priority: High     Goal: Maintain My Quality of [name] through [name].   Start Date: 06/11/2020  Expected End Date: 09/03/2020  This Visit's Progress: On track  Recent Progress: On track  Priority: High  Note:   Current Barriers:   Patient with Dementia without Behavioral Disturbance, Diabetes Mellitus with Circulatory Complication, History of Falls/Fall  Risk, Osteopenia and Hypertension needs support, education, and care coordination to resolve unmet personal care needs in the home. Patient is unable to self-administer medications as prescribed. Patient is unable to consistently perform ADL's/IADL's  independently. Level of care concerns. Lacks knowledge of available community agencies and resources. Clinical Goals:  Over the next 30 days, patient will have in-home care services in place, through Amanda [name] policy with Amanda [name]. LCSW will contact a representative at [employer] [name] to inquire about patient and husband, Amanda [name] through Amanda [name] (I.e List of preferred provider agencies, hours of coverage per day, out-of-pocket expense, etc.).    Patient and husband, Amanda [name] will work with Amanda [name] and Amanda [name] to coordinate care in the home.   LCSW will obtain a list of preferred provider agencies from Amanda [name] policy and encourage patient and her husband, Amanda [name] to decide on at least 4 agencies of interest from that list. Patient will attend all scheduled medical appointments as evidenced by patient report and care team review of appointment completion in electronic medical record. Patient will demonstrate improved health management independence as evidenced by having in-home care services in place. Clinical Interventions: Patient and husband, Amanda [name] interviewed and appropriate assessments performed. Collaboration with patient's Primary Care Physician, Amanda [name], regarding development and update of comprehensive plan of care as evidenced by provider attestation and co-signature. Inter-disciplinary care team collaboration (see longitudinal plan of care). Interventions performed:  Problem Solving/Task Centered, Psychoeducation/Health Education, Quality of Sleep Assessed and Sleep Hygiene Techniques Promoted, Caregiver Stress Acknowledged and Consideration of In-Home Care Services Encouraged. Provide patient and husband, Amanda [name] with a list of preferred provider agencies and have them provide LCSW with at least 4 agencies of interest, during the next scheduled telephone outreach  call. Discussed plans with patient and husband, Amanda [name] for ongoing care management follow-up and provided direct contact information for care management team. Assisted patient and husband, Shantae Vantol with obtaining information about health plan benefits through [facility] and Amanda [name]. Provided education to patient and husband, Cesiah Westley regarding level of care options. Assessed needs, level of care concerns, basic eligibility and provided education on in-home care services referral process. Identified resources and durable medical equipment needed in the home to improve safety and promote independence. Patient Goals/Self-Care Activities:  Receive assistance from granddaughter, [name] with regards to deciding which in-home care agency you would like to use, from the list provided to you by LCSW, paid for through [name].   [name] Long-Term Care Insurance [phone]) Policy #: [id number]; Issued: 10/04/1996.  Policy coverage is for both patient and husband, Ahlivia Salahuddin. LCSW contacted your Primary Care Physician, Dr. Dale [place], to request an order of medical necessity for in-home care services, including supporting diagnoses. LCSW provided your Primary Care Physician, Dr. Dale [place], with your granddaughter, Merri Brunette e-mail address ([email or web address]), per her request, so that Dr. Lorin Picket can e-mail the order directly to Mrs. [name] to help expedite the application process. Once the order is received by your granddaughter, [name], she will attach to the application and fax to Amanda [name] for processing.   Continue to try and perform activities of daily living independently, but with supervision.    Contact LCSW directly (# [id number]) if you have questions regarding the application process for applying for long-term care insurance through [facility].   Remember: Always use handrails on the  stairs. Always  use your cane or walker when ambulating. Always wear your glasses, especially at night or in areas not well lit.  Follow-Up Plan: 08/09/2020 at 2:00pm.      Follow Up Plan:  LCSW will follow-up with patient by phone on 08/09/2020 at 2:00pm.      Danford Bad LCSW Licensed Clinical Social Worker [facility] [place]  [phone]

## 2020-08-03 NOTE — Telephone Encounter (Signed)
Amanda [name] sent this message to me in response to my request to see what we need to do to get help for Amanda [name].  "Good afternoon, I just spoke with Amanda [name], as well as his granddaughter, Amanda [name] and she reported that she has received the paperwork from [employer], she just needs an order from you indicating medical necessity for in-home care services with supporting diagnoses.  Amanda [name] requested that you email:  [email or web address] her directly with the order so she can try and expedite the process as soon as possible.  I will go ahead and email her a list of preferred agency providers.  Thanks"   I have printed a letter.

## 2020-08-03 NOTE — Patient Instructions (Addendum)
Visit Information  PATIENT GOALS:  Goals Addressed             This Visit's Progress    Maintain My Quality of Life through [facility].   On track    Timeframe:  Short-Term Goal Priority:  High Start Date:    06/11/2020                      Expected End Date:   09/03/2020        Follow-Up Date:  08/09/2020 at 2:00pm.   Patient Goals: Receive assistance from granddaughter, [name] with regards to deciding which in-home care agency you would like to use, from the list provided to you by LCSW, paid for through [facility]   [facility] Long-Term Care Insurance [phone]) Policy #: [id number]; Issued: 10/04/1996.  Policy coverage is for both patient and husband, Reyana Leisey. LCSW contacted your Primary Care Physician, Dr. Dale [place], to request an order of medical necessity for in-home care services, including supporting diagnoses. LCSW provided your Primary Care Physician, Dr. Dale [place], with your granddaughter, Merri Brunette e-mail address ([email or web address]), per her request, so that Dr. Lorin Picket can e-mail the order directly to Mrs. [name] to help expedite the application process. Once the order is received by your granddaughter, [name], she will attach to the application and fax to [name] for processing.   Continue to try and perform activities of daily living independently, but with supervision.    Contact LCSW directly (# [id number]) if you have questions regarding the application process for applying for long-term care insurance through [facility].           Patient verbalizes understanding of instructions provided today and agrees to view in MyChart.   Telephone follow up appointment with care management team member scheduled for:  08/09/2020 at 2:00pm  Danford Bad LCSW Licensed Clinical Social Worker [facility] [place]  [phone]

## 2020-08-03 NOTE — Assessment & Plan Note (Signed)
Had intolerance to statin medication.  Continue current blood pressure medication.   

## 2020-08-07 NOTE — Telephone Encounter (Signed)
Called and spoke to [name] over how to send the letter for [employer]. Irving Burton requested the letter mailed to the home address on file. Letter has been mailed.

## 2020-08-07 NOTE — Telephone Encounter (Signed)
Letter has been printed and is awaiting provider signature. Placed in providers quick sign folder.

## 2020-08-07 NOTE — Telephone Encounter (Signed)
Letter signed and placed in box.   

## 2020-08-09 ENCOUNTER — Ambulatory Visit: Payer: Medicare PPO | Admitting: *Deleted

## 2020-08-09 DIAGNOSIS — F0390 Unspecified dementia without behavioral disturbance: Secondary | ICD-10-CM

## 2020-08-09 DIAGNOSIS — I2510 Atherosclerotic heart disease of native coronary artery without angina pectoris: Secondary | ICD-10-CM

## 2020-08-09 DIAGNOSIS — I10 Essential (primary) hypertension: Secondary | ICD-10-CM

## 2020-08-09 DIAGNOSIS — W19XXXD Unspecified fall, subsequent encounter: Secondary | ICD-10-CM

## 2020-08-09 DIAGNOSIS — E1159 Type 2 diabetes mellitus with other circulatory complications: Secondary | ICD-10-CM

## 2020-08-09 NOTE — Patient Instructions (Signed)
Visit Information  PATIENT GOALS:  Goals Addressed             This Visit's Progress    Maintain My Quality of Life through [facility].   On track    Timeframe:  Short-Term Goal Priority:  High Start Date:    06/11/2020                      Expected End Date:   09/03/2020        Follow-Up Date:  08/15/2020 at 11:00am.   Patient Goals/Self-Care Activities: [facility] [phone]) Policy #: [id number]; Issued: 10/04/1996.  Policy coverage is for both patient and husband, Abree Romick.   Primary Care Physician, Dr. Dale [place], has typed a letter of medical necessity for in-home care services, including supporting diagnoses, and it was mailed to your home on 08/03/2020. Please be on the lookout for the letter of medical necessity and notify your granddaughter, [name] as soon as it is received in the mail, so that she can submit with the paperwork to [facility], to expedite the process for you to receive in-home care services.   Once the letter of medical necessity is received, your granddaughter, [name], has agreed to attach the letter to your application and submit to [facility] for processing.  Receive assistance from granddaughter, [name] with regards to deciding which in-home care agency you would like to use, from the list provided to you by LCSW, paid for through [facility].   Contact LCSW directly (# [id number]) if you have questions regarding the application process for applying for long-term care insurance through [facility].          Patient verbalizes understanding of instructions provided today and agrees to view in MyChart.   Telephone follow-up appointment with care management team member scheduled for:  08/15/2020 at 11:00am.  Danford Bad LCSW Licensed Clinical Social Worker [facility] [place]  [phone]

## 2020-08-09 NOTE — Chronic Care Management (AMB) (Signed)
Chronic Care Management    Clinical Social Work Note  08/09/2020 Name: Amanda [name] MRN: [id number] DOB: 10/07/1933  Amanda [name] is a 87 y.o. year old female who is a primary care patient of Amanda Carnot-Moon, MD. The CCM team was consulted to assist the patient with chronic disease management and/or care coordination needs related to: [employer], Level of Care Concerns, and Caregiver Stress.   Engaged with patient by telephone for follow up visit in response to provider referral for social work chronic care management and care coordination services.   Consent to Services:  The patient was given information about Chronic Care Management services, agreed to services, and gave verbal consent prior to initiation of services.  Please see initial visit note for detailed documentation.   Patient agreed to services and consent obtained.   Assessment: Review of patient past medical history, allergies, medications, and health status, including review of relevant consultants reports was performed today as part of a comprehensive evaluation and provision of chronic care management and care coordination services.     SDOH (Social Determinants of Health) assessments and interventions performed:    Advanced Directives Status: Not addressed in this encounter.  CCM Care Plan  Allergies  Allergen Reactions   Atorvastatin Other (See Comments)    Muscle Pain   Sulfa Antibiotics     Outpatient Encounter Medications as of 08/09/2020  Medication Sig   aspirin 81 MG chewable tablet Chew 1 tablet (81 mg total) by mouth daily.   clopidogrel (PLAVIX) 75 MG tablet Take 1 tablet (75 mg total) by mouth daily with breakfast.   donepezil (ARICEPT) 10 MG tablet TAKE ONE TABLET AT BEDTIME.   levothyroxine (SYNTHROID) 75 MCG tablet TAKE ONE-HALF TABLET EVERY DAY   lisinopril (ZESTRIL) 20 MG tablet TAKE (1) TABLET BY MOUTH EVERY DAY   metoprolol tartrate (LOPRESSOR) 25 MG tablet TAKE (1) TABLET BY MOUTH  TWICE DAILY   Syringe/Needle, Disp, (SYRINGE 3CC/25GX1") 25G X 1" 3 ML MISC Use as directed to administer b12 injections.   No facility-administered encounter medications on file as of 08/09/2020.    Patient Active Problem List   Diagnosis Date Noted   Aortic atherosclerosis (HCC) 08/03/2020   Renal cyst 02/01/2019   Anemia 02/01/2019   Acute ST elevation myocardial infarction (STEMI) of inferior wall [facility])    STEMI involving right coronary artery (HCC) 01/21/2019   Unstable angina pectoris (HCC) 01/19/2019   Abdominal bruit 09/14/2017   Muscle ache 03/06/2017   Fall 12/31/2014   Health care maintenance 05/20/2014   Diabetes mellitus with circulatory complication (HCC) 05/17/2013   CAD (coronary artery disease) 05/15/2012   Osteopenia 12/14/2011   Hypertension, essential 11/13/2011   Hypercholesteremia 11/13/2011   Dementia without behavioral disturbance (HCC) 11/13/2011   Hypothyroidism 11/13/2011    Conditions to be addressed/monitored: HTN, DMII, and Dementia.  Limited Social Support, Level of Care Concerns, ADL/IADL Limitations, Cognitive Deficits, Memory Deficits and Limited Access to Caregiver.  Care Plan : LCSW Plan of Care  Updates made by Amanda Stamps, LCSW since 08/09/2020 12:00 AM     Problem: Maintain My Quality of [name] through [facility].   Priority: High     Goal: Maintain My Quality of [name] through [facility].   Start Date: 06/11/2020  Expected End Date: 09/03/2020  This Visit's Progress: On track  Recent Progress: On track  Priority: High  Note:   Current Barriers:   Patient with Dementia without Behavioral Disturbance, Diabetes Mellitus with Circulatory  Complication, History of Falls/Fall Risk, Osteopenia and Hypertension needs support, education, and care coordination to resolve unmet personal care needs in the home. Patient is unable to self-administer medications as prescribed. Patient is unable to consistently perform  ADL's/IADL's independently. Level of care concerns. Lacks knowledge of available community agencies and resources. Clinical Goals:  Over the next 30 days, patient will have in-home care services in place, through Amanda [name] policy with [name]. LCSW will contact a representative at [name] to inquire about patient and husband, Amanda [name] through [name] (I.e List of preferred provider agencies, hours of coverage per day, out-of-pocket expense, etc.).    Patient and husband, Amanda [name] will work with Amanda [name] and Amanda [name] to coordinate care in the home.   LCSW will obtain a list of preferred provider agencies from [facility] policy and encourage patient and her husband, Amanda [name] to decide on at least 4 agencies of interest from that list. Patient will attend all scheduled medical appointments as evidenced by patient report and care team review of appointment completion in electronic medical record. Patient will demonstrate improved health management independence as evidenced by having in-home care services in place. Clinical Interventions: Patient and husband, [name] interviewed and appropriate assessments performed. Collaboration with patient's Primary Care Physician, Dr. Dale Conner, regarding development and update of comprehensive plan of care as evidenced by provider attestation and co-signature. Inter-disciplinary care team collaboration (see longitudinal plan of care). Interventions performed:  Problem Solving/Task Centered, Psychoeducation/Health Education, Quality of Sleep Assessed and Sleep Hygiene Techniques Promoted, Caregiver Stress Acknowledged and Consideration of In-Home Care Services Encouraged. Provide patient and husband, [name] with a list of preferred provider agencies and have them provide LCSW with at least 4 agencies of interest, during the next scheduled telephone  outreach call. Discussed plans with patient and husband, [name] for ongoing care management follow-up and provided direct contact information for care management team. Assisted patient and husband, Gelila Well with obtaining information about health plan benefits through [facility] and [name]. Provided education to patient and husband, Kazuko Clemence regarding level of care options. Assessed needs, level of care concerns, basic eligibility and provided education on in-home care services referral process. Identified resources and durable medical equipment needed in the home to improve safety and promote independence. Patient Goals/Self-Care Activities:  [facility] [phone]) Policy #: [phone]; Issued: 10/04/1996.  Policy coverage is for both patient and husband, Kenasia Scheller.   Primary Care Physician, Dr. Dale [place], has typed a letter of medical necessity for in-home care services, including supporting diagnoses, and it was mailed to your home on 08/03/2020. Please be on the lookout for the letter of medical necessity and notify your granddaughter, [name] as soon as it is received in the mail, so that she can submit with the paperwork to [facility], to expedite the process for you to receive in-home care services.   Once the letter of medical necessity is received, your granddaughter, [name], has agreed to attach the letter to your application and submit to [facility] for processing.  Receive assistance from granddaughter, [name] with regards to deciding which in-home care agency you would like to use, from the list provided to you by LCSW, paid for through [facility].   Contact LCSW directly (# [id number]) if you have questions regarding the application process for applying  for long-term care insurance through [name].    Follow-Up Plan: 08/15/2020 at 11:00am.      Follow-Up Plan:  LCSW will follow-up with husband by phone on 08/15/2020 at 11:00am.      Danford Bad LCSW Licensed Clinical Social Worker [facility] [place]  [phone]

## 2020-08-11 ENCOUNTER — Other Ambulatory Visit: Payer: Self-pay | Admitting: Internal Medicine

## 2020-08-15 ENCOUNTER — Ambulatory Visit: Payer: Medicare PPO | Admitting: *Deleted

## 2020-08-15 DIAGNOSIS — E1159 Type 2 diabetes mellitus with other circulatory complications: Secondary | ICD-10-CM

## 2020-08-15 DIAGNOSIS — E039 Hypothyroidism, unspecified: Secondary | ICD-10-CM

## 2020-08-15 DIAGNOSIS — F0390 Unspecified dementia without behavioral disturbance: Secondary | ICD-10-CM

## 2020-08-15 DIAGNOSIS — I10 Essential (primary) hypertension: Secondary | ICD-10-CM

## 2020-08-15 DIAGNOSIS — I2510 Atherosclerotic heart disease of native coronary artery without angina pectoris: Secondary | ICD-10-CM

## 2020-08-15 NOTE — Chronic Care Management (AMB) (Signed)
Chronic Care Management    Clinical Social Work Note  08/15/2020 Name: Amanda [name] MRN: [id number] DOB: 21-Dec-1933  Amanda [name] is a 87 y.o. year old female who is a primary care patient of Amanda Dublin, MD. The CCM team was consulted to assist the patient with chronic disease management and/or care coordination needs related to: [employer], Level of Care Concerns, and Caregiver Stress.   Engaged with patient 's granddaughter, [name] by telephone for follow up visit in response to provider referral for social work chronic care management and care coordination services.   Consent to Services:  The patient was given information about Chronic Care Management services, agreed to services, and gave verbal consent prior to initiation of services.  Please see initial visit note for detailed documentation.   Patient agreed to services and consent obtained.   Assessment: Review of patient past medical history, allergies, medications, and health status, including review of relevant consultants reports was performed today as part of a comprehensive evaluation and provision of chronic care management and care coordination services.     SDOH (Social Determinants of Health) assessments and interventions performed:    Advanced Directives Status: See Care Plan for related entries.  CCM Care Plan  Allergies  Allergen Reactions   Atorvastatin Other (See Comments)    Muscle Pain   Sulfa Antibiotics     Outpatient Encounter Medications as of 08/15/2020  Medication Sig   aspirin 81 MG chewable tablet Chew 1 tablet (81 mg total) by mouth daily.   clopidogrel (PLAVIX) 75 MG tablet Take 1 tablet (75 mg total) by mouth daily with breakfast.   donepezil (ARICEPT) 10 MG tablet TAKE ONE TABLET AT BEDTIME.   levothyroxine (SYNTHROID) 75 MCG tablet TAKE ONE-HALF TABLET EVERY DAY   lisinopril (ZESTRIL) 20 MG tablet TAKE (1) TABLET BY MOUTH EVERY DAY   metoprolol tartrate (LOPRESSOR) 25 MG  tablet TAKE (1) TABLET BY MOUTH TWICE DAILY   Syringe/Needle, Disp, (SYRINGE 3CC/25GX1") 25G X 1" 3 ML MISC Use as directed to administer b12 injections.   No facility-administered encounter medications on file as of 08/15/2020.    Patient Active Problem List   Diagnosis Date Noted   Aortic atherosclerosis (HCC) 08/03/2020   Renal cyst 02/01/2019   Anemia 02/01/2019   Acute ST elevation myocardial infarction (STEMI) of inferior wall [facility])    STEMI involving right coronary artery (HCC) 01/21/2019   Unstable angina pectoris (HCC) 01/19/2019   Abdominal bruit 09/14/2017   Muscle ache 03/06/2017   Fall 12/31/2014   Health care maintenance 05/20/2014   Diabetes mellitus with circulatory complication (HCC) 05/17/2013   CAD (coronary artery disease) 05/15/2012   Osteopenia 12/14/2011   Hypertension, essential 11/13/2011   Hypercholesteremia 11/13/2011   Dementia without behavioral disturbance (HCC) 11/13/2011   Hypothyroidism 11/13/2011    Conditions to be addressed/monitored: Dementia.  Limited Social Support, Level of Care Concerns, ADL/IADL Limitations, Social Isolation, Limited Access to Caregiver, Cognitive Deficits, Memory Deficits, and Lacks Knowledge of [employer].  Care Plan : LCSW Plan of Care  Updates made by [name], LCSW since 08/15/2020 12:00 AM     Problem: Maintain My Quality of Life through [facility].   Priority: High     Goal: Maintain My Quality of Life through [facility].   Start Date: 06/11/2020  Expected End Date: 09/03/2020  This Visit's Progress: On track  Recent Progress: On track  Priority: High  Note:   Current Barriers:   Patient  with Dementia without Behavioral Disturbance, Diabetes Mellitus with Circulatory Complication, History of Falls/Fall Risk, Osteopenia and Hypertension needs support, education, and care coordination to resolve unmet personal care needs in the home. Patient is unable to self-administer  medications as prescribed. Patient is unable to consistently perform ADL's/IADL's independently. Level of care concerns. Lacks knowledge of available community agencies and resources. Clinical Goals:  Over the next 30 days, patient will have in-home care services in place, through [name] policy with [name]. LCSW will contact a representative at [employer] to inquire about patient and husband, Amanda [name] through [name] (I.e List of preferred provider agencies, hours of coverage per day, out-of-pocket expense, etc.).    Patient and husband, Amanda [name] will work with [name] and [name] to coordinate care in the home.   LCSW will obtain a list of preferred provider agencies from [facility] policy and encourage patient and her husband, Amanda [name] to decide on at least 4 agencies of interest from that list. Patient will attend all scheduled medical appointments as evidenced by patient report and care team review of appointment completion in electronic medical record. Patient will demonstrate improved health management independence as evidenced by having in-home care services in place. Clinical Interventions: Patient and husband, Amanda [name] interviewed and appropriate assessments performed. Collaboration with patient's Primary Care Physician, Dr. Dale [place], regarding development and update of comprehensive plan of care as evidenced by provider attestation and co-signature. Inter-disciplinary care team collaboration (see longitudinal plan of care). Interventions performed:  Problem Solving/Task Centered, Psychoeducation/Health Education, Quality of Sleep Assessed and Sleep Hygiene Techniques Promoted, Caregiver Stress Acknowledged and Consideration of In-Home Care Services Encouraged. Provide patient and husband, Amanda [name] with a list of preferred provider agencies and have them provide LCSW with  at least 4 agencies of interest, during the next scheduled telephone outreach call. Discussed plans with patient and husband, Amanda [name] for ongoing care management follow-up and provided direct contact information for care management team. Assisted patient and husband, Shirin Echeverry with obtaining information about health plan benefits through [facility] and [name]. Provided education to patient and husband, Jeanine Caven regarding level of care options. Assessed needs, level of care concerns, basic eligibility and provided education on in-home care services referral process. Identified resources and durable medical equipment needed in the home to improve safety and promote independence. Patient Goals/Self-Care Activities:  [facility] [phone]) Policy #: [id number]; Issued: 10/04/1996.  Policy coverage is for both patient and husband, Maudell Stanbrough.   Primary Care Physician, Dr. Dale [place], has typed a letter of medical necessity for in-home care services, including supporting diagnoses, and it was mailed to your home on 08/03/2020. Please be on the lookout for the letter of medical necessity from your Primary Care Physician, Dr. Dale [place] and give to your granddaughter, [name] so that she can submit for processing.   Granddaughter, [name] agreed to submit letter of medical necessity and all other necessary paperwork, to [facility], for processing.   Receive assistance from granddaughter, [name] with regards to deciding which in-home care agency you would like to use, from the list provided to you by LCSW, paid for through [facility].   Contact LCSW directly (# [id number]) if you have questions regarding the application process for applying for long-term care insurance through [facility].   Receive bi-weekly telephone outreach calls from LCSW  to check the status  of your application for [employer] in-home care services. Follow-Up Plan: 08/29/2020 at 9:30am.      Follow Up Plan:  LCSW will follow-up with patient and patient's granddaughter, [name] by phone on 08/29/2020 at 9:30am.      Danford Bad LCSW Licensed Clinical Social Worker [facility]  [phone]

## 2020-08-15 NOTE — Patient Instructions (Signed)
Visit Information  PATIENT GOALS:  Goals Addressed             This Visit's Progress    Maintain My Quality of Life through [facility].   On track    Timeframe:  Short-Term Goal Priority:  High Start Date:    06/11/2020                      Expected End Date:   09/03/2020        Follow-Up Date:  08/29/2020 at 9:30am.   Patient Goals/Self-Care Activities: [facility] [phone]) Policy #: [id number]; Issued: 10/04/1996.  Policy coverage is for both patient and husband, Shalese Strahan.   Primary Care Physician, Dr. Dale [place], has typed a letter of medical necessity for in-home care services, including supporting diagnoses, and it was mailed to your home on 08/03/2020. Please be on the lookout for the letter of medical necessity from your Primary Care Physician, Dr. Dale  and give to your granddaughter, [name] so that she can submit for processing.   Granddaughter, [name] agreed to submit letter of medical necessity and all other necessary paperwork, to [facility], for processing.   Receive assistance from granddaughter, [name] with regards to deciding which in-home care agency you would like to use, from the list provided to you by LCSW, paid for through [facility].   Contact LCSW directly (# [id number]) if you have questions regarding the application process for applying for long-term care insurance through [facility].   Receive bi-weekly telephone outreach calls from LCSW to check the status of your application for [name]-Term [employer] Insurance in-home care services.        Patient verbalizes understanding of instructions provided today and agrees to view in MyChart.   Telephone follow-up appointment with care management team member scheduled for:  08/29/2020 at 9:30am.  Danford Bad LCSW Licensed Clinical Social Worker [facility]  [place]  [phone]

## 2020-08-29 ENCOUNTER — Ambulatory Visit (INDEPENDENT_AMBULATORY_CARE_PROVIDER_SITE_OTHER): Payer: Medicare PPO | Admitting: *Deleted

## 2020-08-29 DIAGNOSIS — I2510 Atherosclerotic heart disease of native coronary artery without angina pectoris: Secondary | ICD-10-CM

## 2020-08-29 DIAGNOSIS — F0390 Unspecified dementia without behavioral disturbance: Secondary | ICD-10-CM | POA: Diagnosis not present

## 2020-08-29 DIAGNOSIS — E1159 Type 2 diabetes mellitus with other circulatory complications: Secondary | ICD-10-CM | POA: Diagnosis not present

## 2020-08-29 DIAGNOSIS — I10 Essential (primary) hypertension: Secondary | ICD-10-CM

## 2020-08-29 DIAGNOSIS — W19XXXD Unspecified fall, subsequent encounter: Secondary | ICD-10-CM

## 2020-08-29 NOTE — Chronic Care Management (AMB) (Signed)
Chronic Care Management    Clinical Social Work Note  08/29/2020 Name: Amanda [name] MRN: [id number] DOB: [birth year]  Amanda [name] is a 87 y.o. year old female who is a primary care patient of Dale [place], MD. The CCM team was consulted to assist the patient with chronic disease management and/or care coordination needs related to: [employer], Level of Care Concerns, and Caregiver Stress.   Engaged with patient's granddaughter, [name] by telephone for follow-up visit in response to provider referral for social work chronic care management and care coordination services.   Consent to Services:  The patient was given information about Chronic Care Management services, agreed to services, and gave verbal consent prior to initiation of services.  Please see initial visit note for detailed documentation.   Patient agreed to services and consent obtained.   Assessment: Review of patient past medical history, allergies, medications, and health status, including review of relevant consultants reports was performed today as part of a comprehensive evaluation and provision of chronic care management and care coordination services.     SDOH (Social Determinants of Health) assessments and interventions performed:    Advanced Directives Status: Not addressed in this encounter.  CCM Care Plan  Allergies  Allergen Reactions   Atorvastatin Other (See Comments)    Muscle Pain   Sulfa Antibiotics     Outpatient Encounter Medications as of 08/29/2020  Medication Sig   aspirin 81 MG chewable tablet Chew 1 tablet (81 mg total) by mouth daily.   clopidogrel (PLAVIX) 75 MG tablet Take 1 tablet (75 mg total) by mouth daily with breakfast.   donepezil (ARICEPT) 10 MG tablet TAKE ONE TABLET AT BEDTIME.   levothyroxine (SYNTHROID) 75 MCG tablet TAKE ONE-HALF TABLET EVERY DAY   lisinopril (ZESTRIL) 20 MG tablet TAKE (1) TABLET BY MOUTH EVERY DAY   metoprolol tartrate (LOPRESSOR) 25 MG  tablet TAKE (1) TABLET BY MOUTH TWICE DAILY   Syringe/Needle, Disp, (SYRINGE 3CC/25GX1") 25G X 1" 3 ML MISC Use as directed to administer b12 injections.   No facility-administered encounter medications on file as of 08/29/2020.    Patient Active Problem List   Diagnosis Date Noted   Aortic atherosclerosis (HCC) 08/03/2020   Renal cyst 02/01/2019   Anemia 02/01/2019   Acute ST elevation myocardial infarction (STEMI) of inferior wall [facility])    STEMI involving right coronary artery (HCC) 01/21/2019   Unstable angina pectoris (HCC) 01/19/2019   Abdominal bruit 09/14/2017   Muscle ache 03/06/2017   Fall 12/31/2014   Health care maintenance 05/20/2014   Diabetes mellitus with circulatory complication (HCC) 05/17/2013   CAD (coronary artery disease) 05/15/2012   Osteopenia 12/14/2011   Hypertension, essential 11/13/2011   Hypercholesteremia 11/13/2011   Dementia without behavioral disturbance (HCC) 11/13/2011   Hypothyroidism 11/13/2011    Conditions to be addressed/monitored: Dementia.  Limited Social Support, Level of Care Concerns, Limited Access to Caregiver, Cognitive Deficits, Memory Deficits, Limited Ability to Perform ADL's/IADL's and Caregiver Stress.  Care Plan : LCSW Plan of Care  Updates made by [name], LCSW since 08/29/2020 12:00 AM     Problem: Maintain My Quality of Life through [facility]. Resolved 08/29/2020  Priority: High     Goal: Maintain My Quality of Life through [facility]. Completed 08/29/2020  Start Date: 06/11/2020  Expected End Date: 09/03/2020  This Visit's Progress: On track  Recent Progress: On track  Priority: High  Note:   Current Barriers:   Patient with Dementia without  Behavioral Disturbance, Diabetes Mellitus with Circulatory Complication, History of Falls/Fall Risk, Osteopenia and Hypertension needs support, education, and care coordination to resolve unmet personal care needs in the home. Patient is unable to  self-administer medications as prescribed. Patient is unable to consistently perform ADL's/IADL's independently. Level of care concerns. Lacks knowledge of available community agencies and resources. Clinical Goals:  Over the next 30 days, patient will have in-home care services in place, through [name] policy with [name]. LCSW will contact a representative at [employer] to inquire about patient and husband, [name] through [name] (I.e List of preferred provider agencies, hours of coverage per day, out-of-pocket expense, etc.).    Patient and husband, Amanda [name] will work with [name] and [name] to coordinate care in the home.   LCSW will obtain a list of preferred provider agencies from [facility] policy and encourage patient and her husband, [name] to decide on at least 4 agencies of interest from that list. Patient will attend all scheduled medical appointments as evidenced by patient report and care team review of appointment completion in electronic medical record. Patient will demonstrate improved health management independence as evidenced by having in-home care services in place. Clinical Interventions: Patient and husband, [name] interviewed and appropriate assessments performed. Collaboration with patient's Primary Care Physician, Dr. Dale [place], regarding development and update of comprehensive plan of care as evidenced by provider attestation and co-signature. Inter-disciplinary care team collaboration (see longitudinal plan of care). Interventions performed:  Problem Solving/Task Centered, Psychoeducation/Health Education, Quality of Sleep Assessed and Sleep Hygiene Techniques Promoted, Caregiver Stress Acknowledged and Consideration of In-Home Care Services Encouraged. Provide patient and husband, [name] with a list of preferred provider agencies and have them  provide LCSW with at least 4 agencies of interest, during the next scheduled telephone outreach call. Discussed plans with patient and husband, [name] for ongoing care management follow-up and provided direct contact information for care management team. Assisted patient and husband, Kona Yusuf with obtaining information about health plan benefits through [facility] and [name]. Provided education to patient and husband, Avenell Sellers regarding level of care options. Assessed needs, level of care concerns, basic eligibility and provided education on in-home care services referral process. Identified resources and durable medical equipment needed in the home to improve safety and promote independence. Patient Goals/Self-Care Activities:  [facility] [phone]) Policy # [id number]; Issued: 10/04/1996.  Policy coverage is for both patient and husband, Kimmy Totten.   Letter of medical necessity from Primary Care Physician, Dr. Dale [place], has been submitted to [facility] Policy for approval of in-home care services. Granddaughter, [name] has chosen an in-home care agency from the [facility] Preferred Provider List, and in-home care services will begin as soon as staff is available.   Contact LCSW directly (# [id number]) if you have questions or if additional social work needs arise in the near future.  Follow-Up Plan:  No Follow-Up Required.      Follow-Up Plan:  No Follow-Up Required.      Danford Bad LCSW Licensed Clinical Social Worker [facility]  [phone]

## 2020-08-29 NOTE — Patient Instructions (Signed)
Visit Information  PATIENT GOALS:  Goals Addressed             This Visit's Progress    COMPLETED: Maintain My Quality of Life through [facility].   On track    Timeframe:  Short-Term Goal Priority:  High Start Date:    06/11/2020                      Expected End Date:   08/29/2020        Follow-Up Date:  No Follow-Up Required.   Patient Goals/Self-Care Activities: [facility] [phone]) Policy # [id number]; Issued: 10/04/1996.  Policy coverage is for both patient and husband, Alliah Boulanger.   Letter of medical necessity from Primary Care Physician, Dr. Dale [place], has been submitted to [facility] Policy for approval of in-home care services. Granddaughter, [name] has chosen an in-home care agency from the [facility] Preferred Provider List, and in-home care services will begin as soon as staff is available.   Contact LCSW directly (# [id number]) if you have questions or if additional social work needs arise in the near future.           Patient verbalizes understanding of instructions provided today and agrees to view in MyChart.   No Follow-Up Required.  Danford Bad LCSW Licensed Clinical Social Worker [facility]  [phone]

## 2020-09-26 DIAGNOSIS — G309 Alzheimer's disease, unspecified: Secondary | ICD-10-CM | POA: Diagnosis not present

## 2020-09-26 DIAGNOSIS — F0280 Dementia in other diseases classified elsewhere without behavioral disturbance: Secondary | ICD-10-CM | POA: Diagnosis not present

## 2020-09-26 DIAGNOSIS — F0150 Vascular dementia without behavioral disturbance: Secondary | ICD-10-CM | POA: Diagnosis not present

## 2020-10-12 DIAGNOSIS — I739 Peripheral vascular disease, unspecified: Secondary | ICD-10-CM | POA: Diagnosis not present

## 2020-10-12 DIAGNOSIS — G309 Alzheimer's disease, unspecified: Secondary | ICD-10-CM | POA: Diagnosis not present

## 2020-10-12 DIAGNOSIS — I10 Essential (primary) hypertension: Secondary | ICD-10-CM | POA: Diagnosis not present

## 2020-10-12 DIAGNOSIS — I6529 Occlusion and stenosis of unspecified carotid artery: Secondary | ICD-10-CM | POA: Diagnosis not present

## 2020-10-12 DIAGNOSIS — E785 Hyperlipidemia, unspecified: Secondary | ICD-10-CM | POA: Diagnosis not present

## 2020-10-12 DIAGNOSIS — E039 Hypothyroidism, unspecified: Secondary | ICD-10-CM | POA: Diagnosis not present

## 2020-10-12 DIAGNOSIS — I2510 Atherosclerotic heart disease of native coronary artery without angina pectoris: Secondary | ICD-10-CM | POA: Diagnosis not present

## 2020-10-12 DIAGNOSIS — F01518 Vascular dementia, unspecified severity, with other behavioral disturbance: Secondary | ICD-10-CM | POA: Diagnosis not present

## 2020-10-12 DIAGNOSIS — F02818 Dementia in other diseases classified elsewhere, unspecified severity, with other behavioral disturbance: Secondary | ICD-10-CM | POA: Diagnosis not present

## 2020-10-15 ENCOUNTER — Other Ambulatory Visit: Payer: Self-pay | Admitting: Internal Medicine

## 2020-10-16 ENCOUNTER — Telehealth: Payer: Self-pay | Admitting: Internal Medicine

## 2020-10-16 NOTE — Telephone Encounter (Signed)
Stephanie from [place] is calling to check on orders they faxed over for the patient yesterday.Please call her at [phone].

## 2020-10-16 NOTE — Telephone Encounter (Signed)
Fax is in your quick sign.

## 2020-10-17 NOTE — Telephone Encounter (Signed)
Signed and placed in box.   

## 2020-10-17 NOTE — Telephone Encounter (Signed)
Faxed

## 2020-10-20 DIAGNOSIS — I10 Essential (primary) hypertension: Secondary | ICD-10-CM | POA: Diagnosis not present

## 2020-10-20 DIAGNOSIS — I6529 Occlusion and stenosis of unspecified carotid artery: Secondary | ICD-10-CM | POA: Diagnosis not present

## 2020-10-20 DIAGNOSIS — F01518 Vascular dementia, unspecified severity, with other behavioral disturbance: Secondary | ICD-10-CM | POA: Diagnosis not present

## 2020-10-20 DIAGNOSIS — I2510 Atherosclerotic heart disease of native coronary artery without angina pectoris: Secondary | ICD-10-CM | POA: Diagnosis not present

## 2020-10-20 DIAGNOSIS — E039 Hypothyroidism, unspecified: Secondary | ICD-10-CM | POA: Diagnosis not present

## 2020-10-20 DIAGNOSIS — F02818 Dementia in other diseases classified elsewhere, unspecified severity, with other behavioral disturbance: Secondary | ICD-10-CM | POA: Diagnosis not present

## 2020-10-20 DIAGNOSIS — E785 Hyperlipidemia, unspecified: Secondary | ICD-10-CM | POA: Diagnosis not present

## 2020-10-20 DIAGNOSIS — G309 Alzheimer's disease, unspecified: Secondary | ICD-10-CM | POA: Diagnosis not present

## 2020-10-20 DIAGNOSIS — I739 Peripheral vascular disease, unspecified: Secondary | ICD-10-CM | POA: Diagnosis not present

## 2020-10-24 DIAGNOSIS — E785 Hyperlipidemia, unspecified: Secondary | ICD-10-CM | POA: Diagnosis not present

## 2020-10-24 DIAGNOSIS — F01518 Vascular dementia, unspecified severity, with other behavioral disturbance: Secondary | ICD-10-CM | POA: Diagnosis not present

## 2020-10-24 DIAGNOSIS — E039 Hypothyroidism, unspecified: Secondary | ICD-10-CM | POA: Diagnosis not present

## 2020-10-24 DIAGNOSIS — F02818 Dementia in other diseases classified elsewhere, unspecified severity, with other behavioral disturbance: Secondary | ICD-10-CM | POA: Diagnosis not present

## 2020-10-24 DIAGNOSIS — I2510 Atherosclerotic heart disease of native coronary artery without angina pectoris: Secondary | ICD-10-CM | POA: Diagnosis not present

## 2020-10-24 DIAGNOSIS — I6529 Occlusion and stenosis of unspecified carotid artery: Secondary | ICD-10-CM | POA: Diagnosis not present

## 2020-10-24 DIAGNOSIS — I739 Peripheral vascular disease, unspecified: Secondary | ICD-10-CM | POA: Diagnosis not present

## 2020-10-24 DIAGNOSIS — G309 Alzheimer's disease, unspecified: Secondary | ICD-10-CM | POA: Diagnosis not present

## 2020-10-24 DIAGNOSIS — I10 Essential (primary) hypertension: Secondary | ICD-10-CM | POA: Diagnosis not present

## 2020-10-26 DIAGNOSIS — I10 Essential (primary) hypertension: Secondary | ICD-10-CM | POA: Diagnosis not present

## 2020-10-26 DIAGNOSIS — F02818 Dementia in other diseases classified elsewhere, unspecified severity, with other behavioral disturbance: Secondary | ICD-10-CM | POA: Diagnosis not present

## 2020-10-26 DIAGNOSIS — G309 Alzheimer's disease, unspecified: Secondary | ICD-10-CM | POA: Diagnosis not present

## 2020-10-26 DIAGNOSIS — I739 Peripheral vascular disease, unspecified: Secondary | ICD-10-CM | POA: Diagnosis not present

## 2020-10-26 DIAGNOSIS — F01518 Vascular dementia, unspecified severity, with other behavioral disturbance: Secondary | ICD-10-CM | POA: Diagnosis not present

## 2020-10-26 DIAGNOSIS — E785 Hyperlipidemia, unspecified: Secondary | ICD-10-CM | POA: Diagnosis not present

## 2020-10-26 DIAGNOSIS — I2510 Atherosclerotic heart disease of native coronary artery without angina pectoris: Secondary | ICD-10-CM | POA: Diagnosis not present

## 2020-10-26 DIAGNOSIS — E039 Hypothyroidism, unspecified: Secondary | ICD-10-CM | POA: Diagnosis not present

## 2020-10-26 DIAGNOSIS — I6529 Occlusion and stenosis of unspecified carotid artery: Secondary | ICD-10-CM | POA: Diagnosis not present

## 2020-10-29 ENCOUNTER — Other Ambulatory Visit: Payer: Self-pay

## 2020-10-29 ENCOUNTER — Emergency Department
Admission: EM | Admit: 2020-10-29 | Discharge: 2020-10-30 | Disposition: A | Discharge status: Home / Self Care | Payer: Medicare PPO | Attending: Emergency Medicine | Admitting: Emergency Medicine

## 2020-10-29 DIAGNOSIS — E039 Hypothyroidism, unspecified: Secondary | ICD-10-CM | POA: Insufficient documentation

## 2020-10-29 DIAGNOSIS — K449 Diaphragmatic hernia without obstruction or gangrene: Secondary | ICD-10-CM

## 2020-10-29 DIAGNOSIS — I2510 Atherosclerotic heart disease of native coronary artery without angina pectoris: Secondary | ICD-10-CM | POA: Insufficient documentation

## 2020-10-29 DIAGNOSIS — I10 Essential (primary) hypertension: Secondary | ICD-10-CM | POA: Insufficient documentation

## 2020-10-29 DIAGNOSIS — R1084 Generalized abdominal pain: Secondary | ICD-10-CM | POA: Diagnosis not present

## 2020-10-29 DIAGNOSIS — Z79899 Other long term (current) drug therapy: Secondary | ICD-10-CM | POA: Insufficient documentation

## 2020-10-29 DIAGNOSIS — E1159 Type 2 diabetes mellitus with other circulatory complications: Secondary | ICD-10-CM | POA: Diagnosis not present

## 2020-10-29 DIAGNOSIS — F0390 Unspecified dementia without behavioral disturbance: Secondary | ICD-10-CM | POA: Insufficient documentation

## 2020-10-29 DIAGNOSIS — K5790 Diverticulosis of intestine, part unspecified, without perforation or abscess without bleeding: Secondary | ICD-10-CM

## 2020-10-29 DIAGNOSIS — Z7982 Long term (current) use of aspirin: Secondary | ICD-10-CM | POA: Insufficient documentation

## 2020-10-29 DIAGNOSIS — R109 Unspecified abdominal pain: Secondary | ICD-10-CM

## 2020-10-29 DIAGNOSIS — R069 Unspecified abnormalities of breathing: Secondary | ICD-10-CM | POA: Diagnosis not present

## 2020-10-29 DIAGNOSIS — Z7902 Long term (current) use of antithrombotics/antiplatelets: Secondary | ICD-10-CM | POA: Insufficient documentation

## 2020-10-29 DIAGNOSIS — R1011 Right upper quadrant pain: Secondary | ICD-10-CM | POA: Diagnosis not present

## 2020-10-29 LAB — CBC
HCT: 42.4 % (ref 36.0–46.0)
Hemoglobin: 13.9 g/dL (ref 12.0–15.0)
MCH: 29.7 pg (ref 26.0–34.0)
MCHC: 32.8 g/dL (ref 30.0–36.0)
MCV: 90.6 fL (ref 80.0–100.0)
Platelets: 353 10*3/uL (ref 150–400)
RBC: 4.68 MIL/uL (ref 3.87–5.11)
RDW: 14 % (ref 11.5–15.5)
WBC: 11.1 10*3/uL — ABNORMAL HIGH (ref 4.0–10.5)
nRBC: 0 % (ref 0.0–0.2)

## 2020-10-29 LAB — COMPREHENSIVE METABOLIC PANEL
ALT: 9 U/L (ref 0–44)
AST: 14 U/L — ABNORMAL LOW (ref 15–41)
Albumin: 3.8 g/dL (ref 3.5–5.0)
Alkaline Phosphatase: 70 U/L (ref 38–126)
Anion gap: 9 (ref 5–15)
BUN: 14 mg/dL (ref 8–23)
CO2: 28 mmol/L (ref 22–32)
Calcium: 9.8 mg/dL (ref 8.9–10.3)
Chloride: 103 mmol/L (ref 98–111)
Creatinine, Ser: 1.14 mg/dL — ABNORMAL HIGH (ref 0.44–1.00)
GFR, Estimated: 47 mL/min — ABNORMAL LOW (ref >60)
Glucose, Bld: 106 mg/dL — ABNORMAL HIGH (ref 70–99)
Potassium: 3.8 mmol/L (ref 3.5–5.1)
Sodium: 140 mmol/L (ref 135–145)
Total Bilirubin: 0.7 mg/dL (ref 0.3–1.2)
Total Protein: 7.1 g/dL (ref 6.5–8.1)

## 2020-10-29 LAB — LIPASE, BLOOD: Lipase: 28 U/L (ref 11–51)

## 2020-10-29 LAB — TROPONIN I (HIGH SENSITIVITY): Troponin I (High Sensitivity): 7 ng/L (ref <18)

## 2020-10-29 NOTE — ED Provider Notes (Signed)
Emergency Medicine Provider Triage Evaluation Note  Amanda [name] , a 87 y.o. female  was evaluated in triage.  Pt complains of right abdominal/flank pain.  According to the husband patient has dementia and is largely noncontributory towards her history.  She is complaining of abdominal/flank plain earlier.  She states that she is pain-free at this time.  No emesis, diarrhea or constipation.  There is no reported urinary changes..  Review of Systems  Positive: Right-sided abdominal/flank pain Negative: Fevers, chills, congestion, chest pain, emesis, diarrhea, urinary changes  Physical Exam  There were no vitals taken for this visit. Gen:   Awake, no distress   Resp:  Normal effort  MSK:   Moves extremities without difficulty  Other:  Patient is slightly tender in the right flank region with palpation.  No palpable abnormality.  No true CVA tenderness.  Patient with good bowel sounds, nontender to palpation of the abdomen  Medical Decision Making  Medically screening exam initiated at 7:34 PM.  Appropriate orders placed.  Amanda [name] was informed that the remainder of the evaluation will be completed by another provider, this initial triage assessment does not replace that evaluation, and the importance of remaining in the ED until their evaluation is complete.  Patient presents with right flank/abdominal pain that started today.  No other associated symptoms and patient reports she is currently pain-free.  At this time we will initiate basic labs to include CBC, CMP, lipase and perform urine.  No imaging at this time.   Racheal Patches, PA-C 10/29/20 1935    Gilles Chiquito, MD 10/29/20 [phone]

## 2020-10-29 NOTE — ED Triage Notes (Signed)
Pt comes into the ED via EMS from home with c/o upper abd pain,that started around 5pm today, denies N/V belching, hx of dementia.Living arrangements - the patient lives With husband and grandson  #[id number] 168/72 88HR 20RR 98%RA. [id number]

## 2020-10-30 ENCOUNTER — Emergency Department: Payer: Medicare PPO

## 2020-10-30 ENCOUNTER — Encounter: Payer: Self-pay | Admitting: Radiology

## 2020-10-30 DIAGNOSIS — R1011 Right upper quadrant pain: Secondary | ICD-10-CM | POA: Diagnosis not present

## 2020-10-30 DIAGNOSIS — I10 Essential (primary) hypertension: Secondary | ICD-10-CM | POA: Diagnosis not present

## 2020-10-30 DIAGNOSIS — K449 Diaphragmatic hernia without obstruction or gangrene: Secondary | ICD-10-CM | POA: Diagnosis not present

## 2020-10-30 DIAGNOSIS — R109 Unspecified abdominal pain: Secondary | ICD-10-CM | POA: Diagnosis not present

## 2020-10-30 DIAGNOSIS — K5790 Diverticulosis of intestine, part unspecified, without perforation or abscess without bleeding: Secondary | ICD-10-CM | POA: Diagnosis not present

## 2020-10-30 LAB — URINALYSIS, COMPLETE (UACMP) WITH MICROSCOPIC
Bilirubin Urine: NEGATIVE
Glucose, UA: NEGATIVE mg/dL
Hgb urine dipstick: NEGATIVE
Ketones, ur: 5 mg/dL — AB
Leukocytes,Ua: NEGATIVE
Nitrite: NEGATIVE
Protein, ur: 30 mg/dL — AB
Specific Gravity, Urine: 1.018 (ref 1.005–1.030)
pH: 9 — ABNORMAL HIGH (ref 5.0–8.0)

## 2020-10-30 MED ORDER — IOHEXOL 350 MG/ML SOLN
60.0000 mL | Freq: Once | INTRAVENOUS | Status: AC | PRN
  Administered 2020-10-30: 60 mL via INTRAVENOUS

## 2020-10-30 NOTE — ED Notes (Signed)
Patient assisted to give a urine sample.  Hat placed to obtain a sample.  Patient missed hat and was unable to provide a sample at this time.  Will attempt again

## 2020-10-30 NOTE — Discharge Instructions (Addendum)
You may take Tylenol 1000 mg every 6 hours as needed for pain.  Your labs, urine, abdominal ultrasound and CT scan were normal today.  You did have a small hiatal hernia but no other acute abnormality.

## 2020-10-30 NOTE — ED Provider Notes (Signed)
[facility] Emergency Department Provider Note  ____________________________________________   Event Date/Time   First MD Initiated Contact with Patient 10/29/20 2356     (approximate)  I have reviewed the triage vital signs and the nursing notes.   HISTORY  Chief Complaint Back Pain    HPI Amanda [name] is a 87 y.o. female with history of dementia, hypertension, hyperlipidemia, hypothyroidism, CAD who presents to the emergency department with her husband for concerns that she was complaining of right-sided abdominal pain earlier today.  He states that she was crying in pain.  Since being here in the emergency department, pain seems to have resolved.  She denies any pain currently but is not a very reliable historian due to her dementia.  He is not aware of any recent fevers, vomiting or diarrhea.        Past Medical History:  Diagnosis Date   Carotid bruit    bilaterally   Dementia (HCC)    Hypercholesterolemia    Hypertension    Hypothyroidism     Patient Active Problem List   Diagnosis Date Noted   Aortic atherosclerosis (HCC) 08/03/2020   Renal cyst 02/01/2019   Anemia 02/01/2019   Acute ST elevation myocardial infarction (STEMI) of inferior wall [facility])    STEMI involving right coronary artery (HCC) 01/21/2019   Unstable angina pectoris (HCC) 01/19/2019   Abdominal bruit 09/14/2017   Muscle ache 03/06/2017   Fall 12/31/2014   Health care maintenance 05/20/2014   Diabetes mellitus with circulatory complication (HCC) 05/17/2013   CAD (coronary artery disease) 05/15/2012   Osteopenia 12/14/2011   Hypertension, essential 11/13/2011   Hypercholesteremia 11/13/2011   Dementia without behavioral disturbance (HCC) 11/13/2011   Hypothyroidism 11/13/2011    Past Surgical History:  Procedure Laterality Date   CARPAL TUNNEL RELEASE     CHOLECYSTECTOMY  1991   CORONARY/GRAFT ACUTE MI REVASCULARIZATION N/A 01/21/2019   Procedure:  Coronary/Graft Acute MI Revascularization;  Surgeon: Alwyn Pea, MD;  Location: [facility] INVASIVE CV LAB;  Service: Cardiovascular;  Laterality: N/A;   LEFT HEART CATH AND CORONARY ANGIOGRAPHY N/A 01/21/2019   Procedure: LEFT HEART CATH AND CORONARY ANGIOGRAPHY;  Surgeon: Alwyn Pea, MD;  Location: [facility] INVASIVE CV LAB;  Service: Cardiovascular;  Laterality: N/A;   THORACIC AORTA STENT  12/13   heart stent    Prior to Admission medications   Medication Sig Start Date End Date Taking? Authorizing Provider  aspirin 81 MG chewable tablet Chew 1 tablet (81 mg total) by mouth daily. 01/25/19   Lynn Ito, MD  clopidogrel (PLAVIX) 75 MG tablet Take 1 tablet (75 mg total) by mouth daily with breakfast. 02/01/19   Dale [place], MD  donepezil (ARICEPT) 10 MG tablet TAKE ONE TABLET AT BEDTIME. 04/30/20   Dale [place], MD  levothyroxine (SYNTHROID) 75 MCG tablet TAKE ONE-HALF TABLET EVERY DAY 04/16/20   Dale [place], MD  lisinopril (ZESTRIL) 20 MG tablet TAKE (1) TABLET BY MOUTH EVERY DAY 10/15/20   Dale [place], MD  metoprolol tartrate (LOPRESSOR) 25 MG tablet TAKE (1) TABLET BY MOUTH TWICE DAILY 08/13/20   Dale [place], MD  Syringe/Needle, Disp, (SYRINGE 3CC/25GX1") 25G X 1" 3 ML MISC Use as directed to administer b12 injections. 06/07/19   Dale [place], MD    Allergies Atorvastatin and Sulfa antibiotics  Family History  Problem Relation Age of Onset   Hypertension Father    Lung cancer Father    Hypertension Mother    Dementia Mother  Arthritis Other    Breast cancer Cousin        mat cousin   Colon cancer Neg Hx     Social History Social History   Tobacco Use   Smoking status: Never   Smokeless tobacco: Never  Vaping Use   Vaping Use: Never used  Substance Use Topics   Alcohol use: No    Alcohol/week: 0.0 standard drinks   Drug use: No    Review of Systems Level 5 caveat secondary to  dementia  ____________________________________________   PHYSICAL EXAM:  VITAL SIGNS: ED Triage Vitals [10/29/20 1934]  Enc Vitals Group     BP (!) 173/55     Pulse Rate 73     Resp 20     Temp 98.5 F (36.9 C)     Temp Source Oral     SpO2 94 %     Weight 153 lb (69.4 kg)     Height      Head Circumference      Peak Flow      Pain Score 0     Pain Loc      Pain Edu?      Excl. in GC?    CONSTITUTIONAL: Alert and oriented to person.  Pleasantly demented.  Elderly.  In no distress. HEAD: Normocephalic EYES: Conjunctivae clear, pupils appear equal, EOM appear intact ENT: normal nose; moist mucous membranes NECK: Supple, normal ROM CARD: RRR; S1 and S2 appreciated; no murmurs, no clicks, no rubs, no gallops RESP: Normal chest excursion without splinting or tachypnea; breath sounds clear and equal bilaterally; no wheezes, no rhonchi, no rales, no hypoxia or respiratory distress, speaking full sentences ABD/GI: Normal bowel sounds; non-distended; soft, grimaces when I palpate the right upper quadrant deeply, does not seem to have tenderness at McBurney's point BACK: The back appears normal EXT: Normal ROM in all joints; no deformity noted, no edema; no cyanosis SKIN: Normal color for age and race; warm; no rash on exposed skin NEURO: Moves all extremities equally PSYCH: The patient's mood and manner are appropriate.  ____________________________________________   LABS (all labs ordered are listed, but only abnormal results are displayed)  Labs Reviewed  CBC - Abnormal; Notable for the following components:      Result Value   WBC 11.1 (*)    All other components within normal limits  COMPREHENSIVE METABOLIC PANEL - Abnormal; Notable for the following components:   Glucose, Bld 106 (*)    Creatinine, Ser 1.14 (*)    AST 14 (*)    GFR, Estimated 47 (*)    All other components within normal limits  URINALYSIS, COMPLETE (UACMP) WITH MICROSCOPIC - Abnormal; Notable for  the following components:   Color, Urine YELLOW (*)    APPearance HAZY (*)    pH 9.0 (*)    Ketones, ur 5 (*)    Protein, ur 30 (*)    Bacteria, UA RARE (*)    All other components within normal limits  LIPASE, BLOOD  TROPONIN I (HIGH SENSITIVITY)   ____________________________________________  EKG   EKG Interpretation  Date/Time:  Tuesday October 30 2020 01:22:58 EDT Ventricular Rate:  67 PR Interval:  159 QRS Duration: 79 QT Interval:  420 QTC Calculation: 444 R Axis:   -17 Text Interpretation: Sinus rhythm Inferior infarct, old Confirmed by Rochele Raring [phone]) on 10/30/2020 1:25:00 AM         ____________________________________________  RADIOLOGY Normajean Baxter Harve Spradley, personally viewed and evaluated these images (plain radiographs) as  part of my medical decision making, as well as reviewing the written report by the radiologist.  ED MD interpretation:  CT shows no abnormality  Official radiology report(s): CT ABDOMEN PELVIS W CONTRAST  Result Date: 10/30/2020 CLINICAL DATA:  87 year old female with upper abdominal pain since 1700 hours. EXAM: CT ABDOMEN AND PELVIS WITH CONTRAST TECHNIQUE: Multidetector CT imaging of the abdomen and pelvis was performed using the standard protocol following bolus administration of intravenous contrast. CONTRAST:  27mL OMNIPAQUE IOHEXOL 350 MG/ML SOLN COMPARISON:  CT Abdomen and Pelvis 01/22/2019. FINDINGS: Lower chest: Mild chronic bilateral lower lobe scarring, with improved lung base ventilation compared to 2020. No pericardial or pleural effusion. Coronary artery calcified atherosclerosis. Hepatobiliary: Chronically absent gallbladder.  Negative liver. Pancreas: Negative. Spleen: Negative. Adrenals/Urinary Tract: Normal adrenal glands. Chronic renal cortical volume loss. But no nephrolithiasis or renal obstruction. Chronic right renal lower pole cyst appears stable and benign. Symmetric early renal contrast excretion on the delayed images.  Negative ureters and decompressed bladder. Chronic pelvic phleboliths. Stomach/Bowel: Diverticulosis is widespread from the splenic flexure through the sigmoid. No active inflammation in those segments. Decompressed rectum. Redundant transverse colon containing some fluid. Some transverse diverticulosis also, mostly near the hepatic flexure. Relatively decompressed right colon. Normal appendix on series 2, image 63. Decompressed terminal ileum. No dilated small bowel. No convincing large or small bowel inflammation. Chronic small hiatal hernia. No gastro duodenal inflammation. No free air, free fluid, or mesenteric inflammation identified. Vascular/Lymphatic: Aortoiliac calcified atherosclerosis. Major arterial structures are patent. Normal caliber abdominal aorta. Portal venous system is patent. No lymphadenopathy. Reproductive: Within normal limits. Other: No pelvic free fluid. Musculoskeletal: Chronic lumbar spine degeneration including mild spondylolisthesis. Widespread flowing endplate osteophytes in the thoracic spine result in interbody ankylosis. Benign vertebral body hemangioma suspected at T7. No acute osseous abnormality identified. IMPRESSION: 1. No acute or inflammatory process identified in the abdomen or pelvis. 2. Chronic small hiatal hernia. Widespread diverticulosis of the large bowel but no active inflammation identified. 3. Aortic Atherosclerosis (ICD10-I70.0). Electronically Signed   By: Odessa Fleming M.D.   On: 10/30/2020 04:52   US ABDOMEN LIMITED RUQ (LIVER/GB)  Result Date: 10/30/2020 CLINICAL DATA:  Right-sided abdominal pain for 1 day EXAM: ULTRASOUND ABDOMEN LIMITED RIGHT UPPER QUADRANT COMPARISON:  None. FINDINGS: Gallbladder: Surgically removed Common bile duct: Diameter: 6.8 mm. This is within normal limits for the patient's age and post cholecystectomy state. Liver: No focal lesion identified. Within normal limits in parenchymal echogenicity. Portal vein is patent on color Doppler  imaging with normal direction of blood flow towards the liver. Other: None. IMPRESSION: Status post cholecystectomy.  No acute abnormality noted. Electronically Signed   By: Alcide Clever M.D.   On: 10/30/2020 02:33    ____________________________________________   PROCEDURES  Procedure(s) performed (including Critical Care):  Procedures    ____________________________________________   INITIAL IMPRESSION / ASSESSMENT AND PLAN / ED COURSE  As part of my medical decision making, I reviewed the following data within the electronic MEDICAL RECORD NUMBER History obtained from family, Nursing notes reviewed and incorporated, Labs reviewed , EKG interpreted , Old EKG reviewed, Old chart reviewed, CT and [place] reviewed, and Notes from prior ED visits         Patient here with complaints of right-sided abdominal pain.  Seems tender in the right upper quadrant.  Husband denies any previous abdominal surgeries.  Labs obtained from triage are unremarkable.  Seems to be atypical for her anginal equivalent.  Differential includes cholelithiasis, cholecystitis, pancreatitis, cholangitis, GERD,  gastritis, kidney stone, pyelonephritis, appendicitis.  Urine pending.  Will obtain EKG.  We will also obtain right upper quadrant ultrasound.  ED PROGRESS  Ultrasound shows no acute abnormality.  She is status postcholecystectomy although patient and husband denies that she is ever had any abdominal surgeries.  She still seems to have some mid right-sided tenderness when I palpate deeply.  Will obtain CT of the abdomen pelvis.  5:50 AM  Pt's CT shows small hiatal hernia but no other abnormality to explain her pain.  Patient still well appearing here without complaints.  I feel she is safe to be dc'ed home with follow up with her PCP.  At this time, I do not feel there is any life-threatening condition present. I have reviewed, interpreted and discussed all results (EKG, imaging, lab, urine as appropriate) and exam  findings with patient/family. I have reviewed nursing notes and appropriate previous records.  I feel the patient is safe to be discharged home without further emergent workup and can continue workup as an outpatient as needed. Discussed usual and customary return precautions. Patient/family verbalize understanding and are comfortable with this plan.  Outpatient follow-up has been provided as needed. All questions have been answered.  ____________________________________________   FINAL CLINICAL IMPRESSION(S) / ED DIAGNOSES  Final diagnoses:  Right sided abdominal pain  Hiatal hernia  Diverticulosis     ED Discharge Orders     None       *Please note:  Amanda [name] was evaluated in Emergency Department on 10/30/2020 for the symptoms described in the history of present illness. She was evaluated in the context of the global COVID-19 pandemic, which necessitated consideration that the patient might be at risk for infection with the SARS-CoV-2 virus that causes COVID-19. Institutional protocols and algorithms that pertain to the evaluation of patients at risk for COVID-19 are in a state of rapid change based on information released by regulatory bodies including the CDC and federal and state organizations. These policies and algorithms were followed during the patient's care in the ED.  Some ED evaluations and interventions may be delayed as a result of limited staffing during and the pandemic.*   Note:  This document was prepared using Dragon voice recognition software and may include unintentional dictation errors.    Ruthvik Barnaby, Layla Maw, DO 10/30/20 [phone]

## 2020-11-01 ENCOUNTER — Telehealth: Payer: Self-pay | Admitting: Internal Medicine

## 2020-11-01 DIAGNOSIS — F02818 Dementia in other diseases classified elsewhere, unspecified severity, with other behavioral disturbance: Secondary | ICD-10-CM | POA: Diagnosis not present

## 2020-11-01 DIAGNOSIS — F01518 Vascular dementia, unspecified severity, with other behavioral disturbance: Secondary | ICD-10-CM | POA: Diagnosis not present

## 2020-11-01 DIAGNOSIS — E039 Hypothyroidism, unspecified: Secondary | ICD-10-CM | POA: Diagnosis not present

## 2020-11-01 DIAGNOSIS — E785 Hyperlipidemia, unspecified: Secondary | ICD-10-CM | POA: Diagnosis not present

## 2020-11-01 DIAGNOSIS — I739 Peripheral vascular disease, unspecified: Secondary | ICD-10-CM | POA: Diagnosis not present

## 2020-11-01 DIAGNOSIS — I6529 Occlusion and stenosis of unspecified carotid artery: Secondary | ICD-10-CM | POA: Diagnosis not present

## 2020-11-01 DIAGNOSIS — I10 Essential (primary) hypertension: Secondary | ICD-10-CM | POA: Diagnosis not present

## 2020-11-01 DIAGNOSIS — I2510 Atherosclerotic heart disease of native coronary artery without angina pectoris: Secondary | ICD-10-CM | POA: Diagnosis not present

## 2020-11-01 DIAGNOSIS — G309 Alzheimer's disease, unspecified: Secondary | ICD-10-CM | POA: Diagnosis not present

## 2020-11-01 NOTE — Telephone Encounter (Signed)
Order for physical therapy faxed over 10/29/20. Patient was seen in hospital 10/29/20 and they would like to get this started.   States they can not start physical therapy visits until these are signed. Please advise    Call back number [phone] [facility]

## 2020-11-01 NOTE — Telephone Encounter (Signed)
ORDERS FAXED.

## 2020-11-05 DIAGNOSIS — I6529 Occlusion and stenosis of unspecified carotid artery: Secondary | ICD-10-CM | POA: Diagnosis not present

## 2020-11-05 DIAGNOSIS — G309 Alzheimer's disease, unspecified: Secondary | ICD-10-CM | POA: Diagnosis not present

## 2020-11-05 DIAGNOSIS — I739 Peripheral vascular disease, unspecified: Secondary | ICD-10-CM | POA: Diagnosis not present

## 2020-11-05 DIAGNOSIS — I2510 Atherosclerotic heart disease of native coronary artery without angina pectoris: Secondary | ICD-10-CM | POA: Diagnosis not present

## 2020-11-05 DIAGNOSIS — E039 Hypothyroidism, unspecified: Secondary | ICD-10-CM | POA: Diagnosis not present

## 2020-11-05 DIAGNOSIS — E785 Hyperlipidemia, unspecified: Secondary | ICD-10-CM | POA: Diagnosis not present

## 2020-11-05 DIAGNOSIS — F01518 Vascular dementia, unspecified severity, with other behavioral disturbance: Secondary | ICD-10-CM | POA: Diagnosis not present

## 2020-11-05 DIAGNOSIS — F02818 Dementia in other diseases classified elsewhere, unspecified severity, with other behavioral disturbance: Secondary | ICD-10-CM | POA: Diagnosis not present

## 2020-11-05 DIAGNOSIS — I10 Essential (primary) hypertension: Secondary | ICD-10-CM | POA: Diagnosis not present

## 2020-11-07 DIAGNOSIS — I10 Essential (primary) hypertension: Secondary | ICD-10-CM | POA: Diagnosis not present

## 2020-11-07 DIAGNOSIS — E785 Hyperlipidemia, unspecified: Secondary | ICD-10-CM | POA: Diagnosis not present

## 2020-11-07 DIAGNOSIS — F02818 Dementia in other diseases classified elsewhere, unspecified severity, with other behavioral disturbance: Secondary | ICD-10-CM | POA: Diagnosis not present

## 2020-11-07 DIAGNOSIS — E039 Hypothyroidism, unspecified: Secondary | ICD-10-CM | POA: Diagnosis not present

## 2020-11-07 DIAGNOSIS — G309 Alzheimer's disease, unspecified: Secondary | ICD-10-CM | POA: Diagnosis not present

## 2020-11-07 DIAGNOSIS — I739 Peripheral vascular disease, unspecified: Secondary | ICD-10-CM | POA: Diagnosis not present

## 2020-11-07 DIAGNOSIS — I2510 Atherosclerotic heart disease of native coronary artery without angina pectoris: Secondary | ICD-10-CM | POA: Diagnosis not present

## 2020-11-07 DIAGNOSIS — F01518 Vascular dementia, unspecified severity, with other behavioral disturbance: Secondary | ICD-10-CM | POA: Diagnosis not present

## 2020-11-07 DIAGNOSIS — I6529 Occlusion and stenosis of unspecified carotid artery: Secondary | ICD-10-CM | POA: Diagnosis not present

## 2020-11-08 ENCOUNTER — Other Ambulatory Visit: Payer: Self-pay

## 2020-11-08 ENCOUNTER — Ambulatory Visit (INDEPENDENT_AMBULATORY_CARE_PROVIDER_SITE_OTHER): Payer: Medicare PPO | Admitting: Internal Medicine

## 2020-11-08 ENCOUNTER — Encounter: Payer: Self-pay | Admitting: Internal Medicine

## 2020-11-08 VITALS — BP 118/64 | HR 74 | Temp 97.6°F | Ht 63.0 in | Wt 163.0 lb

## 2020-11-08 DIAGNOSIS — I700 Atherosclerosis of aorta: Secondary | ICD-10-CM | POA: Diagnosis not present

## 2020-11-08 DIAGNOSIS — D649 Anemia, unspecified: Secondary | ICD-10-CM | POA: Diagnosis not present

## 2020-11-08 DIAGNOSIS — I10 Essential (primary) hypertension: Secondary | ICD-10-CM | POA: Diagnosis not present

## 2020-11-08 DIAGNOSIS — E1159 Type 2 diabetes mellitus with other circulatory complications: Secondary | ICD-10-CM

## 2020-11-08 DIAGNOSIS — Z23 Encounter for immunization: Secondary | ICD-10-CM

## 2020-11-08 DIAGNOSIS — F0390 Unspecified dementia without behavioral disturbance: Secondary | ICD-10-CM | POA: Diagnosis not present

## 2020-11-08 DIAGNOSIS — E7800 Pure hypercholesterolemia, unspecified: Secondary | ICD-10-CM | POA: Diagnosis not present

## 2020-11-08 DIAGNOSIS — I2510 Atherosclerotic heart disease of native coronary artery without angina pectoris: Secondary | ICD-10-CM | POA: Diagnosis not present

## 2020-11-08 DIAGNOSIS — E039 Hypothyroidism, unspecified: Secondary | ICD-10-CM | POA: Diagnosis not present

## 2020-11-08 LAB — HEPATIC FUNCTION PANEL
ALT: 9 U/L (ref 0–35)
AST: 14 U/L (ref 0–37)
Albumin: 4 g/dL (ref 3.5–5.2)
Alkaline Phosphatase: 76 U/L (ref 39–117)
Bilirubin, Direct: 0.1 mg/dL (ref 0.0–0.3)
Total Bilirubin: 0.5 mg/dL (ref 0.2–1.2)
Total Protein: 7.2 g/dL (ref 6.0–8.3)

## 2020-11-08 LAB — CBC WITH DIFFERENTIAL/PLATELET
Basophils Absolute: 0.1 10*3/uL (ref 0.0–0.1)
Basophils Relative: 1 % (ref 0.0–3.0)
Eosinophils Absolute: 0.2 10*3/uL (ref 0.0–0.7)
Eosinophils Relative: 1.6 % (ref 0.0–5.0)
HCT: 44.4 % (ref 36.0–46.0)
Hemoglobin: 14.3 g/dL (ref 12.0–15.0)
Lymphocytes Relative: 18.1 % (ref 12.0–46.0)
Lymphs Abs: 2 10*3/uL (ref 0.7–4.0)
MCHC: 32.3 g/dL (ref 30.0–36.0)
MCV: 88.6 fl (ref 78.0–100.0)
Monocytes Absolute: 1 10*3/uL (ref 0.1–1.0)
Monocytes Relative: 9.7 % (ref 3.0–12.0)
Neutro Abs: 7.5 10*3/uL (ref 1.4–7.7)
Neutrophils Relative %: 69.6 % (ref 43.0–77.0)
Platelets: 364 10*3/uL (ref 150.0–400.0)
RBC: 5.01 Mil/uL (ref 3.87–5.11)
RDW: 14.7 % (ref 11.5–15.5)
WBC: 10.8 10*3/uL — ABNORMAL HIGH (ref 4.0–10.5)

## 2020-11-08 LAB — LIPID PANEL
Cholesterol: 235 mg/dL — ABNORMAL HIGH (ref 0–200)
HDL: 39 mg/dL — ABNORMAL LOW (ref >39.00)
LDL Cholesterol: 158 mg/dL — ABNORMAL HIGH (ref 0–99)
NonHDL: 196.09
Total CHOL/HDL Ratio: 6
Triglycerides: 192 mg/dL — ABNORMAL HIGH (ref 0.0–149.0)
VLDL: 38.4 mg/dL (ref 0.0–40.0)

## 2020-11-08 LAB — BASIC METABOLIC PANEL
BUN: 14 mg/dL (ref 6–23)
CO2: 31 mEq/L (ref 19–32)
Calcium: 10 mg/dL (ref 8.4–10.5)
Chloride: 103 mEq/L (ref 96–112)
Creatinine, Ser: 1.17 mg/dL (ref 0.40–1.20)
GFR: 42.55 mL/min — ABNORMAL LOW (ref >60.00)
Glucose, Bld: 102 mg/dL — ABNORMAL HIGH (ref 70–99)
Potassium: 5 mEq/L (ref 3.5–5.1)
Sodium: 139 mEq/L (ref 135–145)

## 2020-11-08 LAB — HEMOGLOBIN A1C: Hgb A1c MFr Bld: 6.5 % (ref 4.6–6.5)

## 2020-11-08 LAB — TSH: TSH: 2.74 u[IU]/mL (ref 0.35–5.50)

## 2020-11-08 NOTE — Progress Notes (Signed)
rePatient ID: Amanda [name], female   DOB: Jul 29, 1933, 87 y.o.   MRN: [id number]   Subjective:    Patient ID: Amanda [name], female    DOB: 06-04-1933, 87 y.o.   MRN: [id number]  This visit occurred during the SARS-CoV-2 public health emergency.  Safety protocols were in place, including screening questions prior to the visit, additional usage of staff PPE, and extensive cleaning of exam room while observing appropriate contact time as indicated for disinfecting solutions.   Patient here for a scheduled follow up.     HPI She is accompanied by her husband.  History obtained from both of them.  Recently evaluated in ER for right side abdominal pain.  Evaluation unrevealing.  No pain since.  Eating.  No nausea or vomiting.  No chest pain or sob reported.  Bowels moving.  [facility] - receiving therapy.  Husband request social work - to see if can provide aids - where he could leave home and do errands.  Also discussed meals on wheels.  Still with memory issues.  Namenda added recently.     Past Medical History:  Diagnosis Date   Carotid bruit    bilaterally   Dementia ([place])    Hypercholesterolemia    Hypertension    Hypothyroidism    Past Surgical History:  Procedure Laterality Date   CARPAL TUNNEL RELEASE     CHOLECYSTECTOMY  1991   CORONARY/GRAFT ACUTE MI REVASCULARIZATION N/A 01/21/2019   Procedure: Coronary/Graft Acute MI Revascularization;  Surgeon: Yolonda Kida, MD;  Location: [facility];  Service: Cardiovascular;  Laterality: N/A;   LEFT HEART CATH AND CORONARY ANGIOGRAPHY N/A 01/21/2019   Procedure: LEFT HEART CATH AND CORONARY ANGIOGRAPHY;  Surgeon: Yolonda Kida, MD;  Location: [facility];  Service: Cardiovascular;  Laterality: N/A;   THORACIC AORTA STENT  12/13   heart stent   Family History  Problem Relation Age of Onset   Hypertension Father    Lung cancer Father    Hypertension Mother    Dementia Mother    Arthritis Other    Breast  cancer Cousin        mat cousin   Colon cancer Neg Hx    Social History   Socioeconomic History   Marital status: Married    Spouse name: Jaymi Tinner   Number of children: 2   Years of education: 16   Highest education level: Bachelor's degree (e.g., BA, AB, BS)  Occupational History   Occupation: Retired Education officer, museum  Tobacco Use   Smoking status: Never   Smokeless tobacco: Never  Vaping Use   Vaping Use: Never used  Substance and Sexual Activity   Alcohol use: No    Alcohol/week: 0.0 standard drinks   Drug use: No   Sexual activity: Not Currently  Other Topics Concern   Not on file  Social History Narrative   She is an only child. 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   Social Determinants of Health   Financial Resource Strain: Low Risk    Difficulty of Paying Living Expenses: Not hard at all  Food Insecurity: No Food Insecurity   Worried About Charity fundraiser in the Last Year: Never true   [place] in the Last Year: Never true  Transportation Needs: No Transportation Needs   Lack of Transportation (Medical): No   Lack of Transportation (Non-Medical): No  Physical Activity: Inactive   Days  of Exercise per Week: 0 days   Minutes of Exercise per Session: 0 min  Stress: No Stress Concern Present   Feeling of Stress : Not at all  Social Connections: Moderately Isolated   Frequency of Communication with Friends and Family: More than three times a week   Frequency of Social Gatherings with Friends and Family: More than three times a week   Attends Religious Services: Never   Marine scientist or Organizations: No   Attends Archivist Meetings: Never   Marital Status: Married     Review of Systems  Constitutional:  Negative for appetite change and unexpected weight change.  HENT:  Negative for congestion and sinus pressure.   Respiratory:  Negative for cough, chest tightness and shortness of breath.    Cardiovascular:  Negative for chest pain, palpitations and leg swelling.  Gastrointestinal:  Negative for abdominal pain, diarrhea, nausea and vomiting.  Genitourinary:  Negative for difficulty urinating and dysuria.  Musculoskeletal:  Negative for joint swelling and myalgias.  Skin:  Negative for color change and rash.  Neurological:  Negative for dizziness, light-headedness and headaches.  Psychiatric/Behavioral:  Negative for agitation and dysphoric mood.       Objective:     BP 118/64 (BP Location: Left Arm, Patient Position: Sitting, Cuff Size: Normal)   Pulse 74   Temp 97.6 F (36.4 C) (Oral)   Ht 5' 3"  (1.6 m)   Wt 163 lb (73.9 kg)   SpO2 96%   BMI 28.87 kg/m  Wt Readings from Last 3 Encounters:  11/08/20 163 lb (73.9 kg)  10/29/20 153 lb (69.4 kg)  08/01/20 167 lb (75.8 kg)    Physical Exam Vitals reviewed.  Constitutional:      General: She is not in acute distress.    Appearance: Normal appearance.  HENT:     Head: Normocephalic and atraumatic.     Right Ear: External ear normal.     Left Ear: External ear normal.  Eyes:     General: No scleral icterus.       Right eye: No discharge.        Left eye: No discharge.     Conjunctiva/sclera: Conjunctivae normal.  Neck:     Thyroid: No thyromegaly.  Cardiovascular:     Rate and Rhythm: Normal rate and regular rhythm.  Pulmonary:     Effort: No respiratory distress.     Breath sounds: Normal breath sounds. No wheezing.  Abdominal:     General: Bowel sounds are normal.     Palpations: Abdomen is soft.     Tenderness: There is no abdominal tenderness.  Musculoskeletal:        General: No swelling or tenderness.     Cervical back: Neck supple. No tenderness.  Lymphadenopathy:     Cervical: No cervical adenopathy.  Skin:    Findings: No erythema or rash.  Neurological:     Mental Status: She is alert.  Psychiatric:        Mood and Affect: Mood normal.        Behavior: Behavior normal.      Outpatient Encounter Medications as of 11/08/2020  Medication Sig   aspirin 81 MG chewable tablet Chew 1 tablet (81 mg total) by mouth daily.   clopidogrel (PLAVIX) 75 MG tablet Take 1 tablet (75 mg total) by mouth daily with breakfast.   donepezil (ARICEPT) 10 MG tablet TAKE ONE TABLET AT BEDTIME.   levothyroxine (SYNTHROID) 75 MCG tablet TAKE ONE-HALF  TABLET EVERY DAY   lisinopril (ZESTRIL) 20 MG tablet TAKE (1) TABLET BY MOUTH EVERY DAY   memantine (NAMENDA) 5 MG tablet Take 2 tablets by mouth at bedtime.   metoprolol tartrate (LOPRESSOR) 25 MG tablet TAKE (1) TABLET BY MOUTH TWICE DAILY   Syringe/Needle, Disp, (SYRINGE 3CC/25GX1") 25G X 1" 3 ML MISC Use as directed to administer b12 injections.   No facility-administered encounter medications on file as of 11/08/2020.     Lab Results  Component Value Date   WBC 10.8 (H) 11/08/2020   HGB 14.3 11/08/2020   HCT 44.4 11/08/2020   PLT 364.0 11/08/2020   GLUCOSE 102 (H) 11/08/2020   CHOL 235 (H) 11/08/2020   TRIG 192.0 (H) 11/08/2020   HDL 39.00 (L) 11/08/2020   LDLDIRECT 164.0 10/12/2019   LDLCALC 158 (H) 11/08/2020   ALT 9 11/08/2020   AST 14 11/08/2020   NA 139 11/08/2020   K 5.0 11/08/2020   CL 103 11/08/2020   CREATININE 1.17 11/08/2020   BUN 14 11/08/2020   CO2 31 11/08/2020   TSH 2.74 11/08/2020   INR 1.0 01/21/2019   HGBA1C 6.5 11/08/2020   MICROALBUR 0.3 05/25/2020    CT ABDOMEN PELVIS W CONTRAST  Result Date: 10/30/2020 CLINICAL DATA:  87 year old female with upper abdominal pain since 1700 hours. EXAM: CT ABDOMEN AND PELVIS WITH CONTRAST TECHNIQUE: Multidetector CT imaging of the abdomen and pelvis was performed using the standard protocol following bolus administration of intravenous contrast. CONTRAST:  51m OMNIPAQUE IOHEXOL 350 MG/ML SOLN COMPARISON:  CT Abdomen and Pelvis 01/22/2019. FINDINGS: Lower chest: Mild chronic bilateral lower lobe scarring, with improved lung base ventilation compared to 2020.  No pericardial or pleural effusion. Coronary artery calcified atherosclerosis. Hepatobiliary: Chronically absent gallbladder.  Negative liver. Pancreas: Negative. Spleen: Negative. Adrenals/Urinary Tract: Normal adrenal glands. Chronic renal cortical volume loss. But no nephrolithiasis or renal obstruction. Chronic right renal lower pole cyst appears stable and benign. Symmetric early renal contrast excretion on the delayed images. Negative ureters and decompressed bladder. Chronic pelvic phleboliths. Stomach/Bowel: Diverticulosis is widespread from the splenic flexure through the sigmoid. No active inflammation in those segments. Decompressed rectum. Redundant transverse colon containing some fluid. Some transverse diverticulosis also, mostly near the hepatic flexure. Relatively decompressed right colon. Normal appendix on series 2, image 63. Decompressed terminal ileum. No dilated small bowel. No convincing large or small bowel inflammation. Chronic small hiatal hernia. No gastro duodenal inflammation. No free air, free fluid, or mesenteric inflammation identified. Vascular/Lymphatic: Aortoiliac calcified atherosclerosis. Major arterial structures are patent. Normal caliber abdominal aorta. Portal venous system is patent. No lymphadenopathy. Reproductive: Within normal limits. Other: No pelvic free fluid. Musculoskeletal: Chronic lumbar spine degeneration including mild spondylolisthesis. Widespread flowing endplate osteophytes in the thoracic spine result in interbody ankylosis. Benign vertebral body hemangioma suspected at T7. No acute osseous abnormality identified. IMPRESSION: 1. No acute or inflammatory process identified in the abdomen or pelvis. 2. Chronic small hiatal hernia. Widespread diverticulosis of the large bowel but no active inflammation identified. 3. Aortic Atherosclerosis (ICD10-I70.0). Electronically Signed   By: HGenevie AnnM.D.   On: 10/30/2020 04:52   [place]ABDOMEN LIMITED RUQ  (LIVER/GB)  Result Date: 10/30/2020 CLINICAL DATA:  Right-sided abdominal pain for 1 day EXAM: ULTRASOUND ABDOMEN LIMITED RIGHT UPPER QUADRANT COMPARISON:  None. FINDINGS: Gallbladder: Surgically removed Common bile duct: Diameter: 6.8 mm. This is within normal limits for the patient's age and post cholecystectomy state. Liver: No focal lesion identified. Within normal limits in parenchymal echogenicity. Portal  vein is patent on color Doppler imaging with normal direction of blood flow towards the liver. Other: None. IMPRESSION: Status post cholecystectomy.  No acute abnormality noted. Electronically Signed   By: Inez Catalina M.D.   On: 10/30/2020 02:33       Assessment & Plan:   Problem List Items Addressed This Visit     Anemia    Follow cbc.       Aortic atherosclerosis ([place])    Had intolerance to statin medication.  Continue current blood pressure medication.        CAD (coronary artery disease)    Known CAD.  S/p DES - RCA.  Continue plavix.  Sees cardiology.  Intolerant to statin medication.  Stable.  Follow.        Dementia without behavioral disturbance ([place])    Continues on aricept.  Husband reported worsening memory issues.  Have discussed addition of namenda.  Request formal neurology evaluation.  Social work - [place] - recent fall, ER visits.  Needs help in the home. Would like to be able to get away periodically to do errands, etc.  Also interested in Meals on Wheels.   Has [employer] policy for home care.  D/w social work regarding resources available.  Contact person - grandson Delfino Lovett [phone]) and granddaughter:  Gustavus Messing [phone].        Relevant Medications   memantine (NAMENDA) 5 MG tablet   Diabetes mellitus with circulatory complication (HCC)    On no medication.  Low carb diet and exercise.  Follow met b and a1c.        Relevant Orders   Hemoglobin A1c (Completed)   Hypercholesteremia    Intolerant to statin medication.  Not  taking zetia.  Low cholesterol diet and exercise.  Follow lipid panel.       Relevant Orders   Lipid panel (Completed)   Hepatic function panel (Completed)   Hypertension, essential - Primary    Continue lisinopril and metoprolol.  Blood pressure doing well.  Follow pressures.  Follow metabolic panel.       Relevant Orders   CBC with Differential/Platelet (Completed)   Basic metabolic panel (Completed)   Hypothyroidism    On thyroid replacement.  Follow tsh.       Relevant Orders   TSH (Completed)   Other Visit Diagnoses     Need for immunization against influenza       Relevant Orders   Flu Vaccine QUAD High Dose(Fluad) (Completed)        Einar Pheasant, MD

## 2020-11-09 DIAGNOSIS — E039 Hypothyroidism, unspecified: Secondary | ICD-10-CM | POA: Diagnosis not present

## 2020-11-09 DIAGNOSIS — I739 Peripheral vascular disease, unspecified: Secondary | ICD-10-CM | POA: Diagnosis not present

## 2020-11-09 DIAGNOSIS — G309 Alzheimer's disease, unspecified: Secondary | ICD-10-CM | POA: Diagnosis not present

## 2020-11-09 DIAGNOSIS — I6529 Occlusion and stenosis of unspecified carotid artery: Secondary | ICD-10-CM | POA: Diagnosis not present

## 2020-11-09 DIAGNOSIS — I2510 Atherosclerotic heart disease of native coronary artery without angina pectoris: Secondary | ICD-10-CM | POA: Diagnosis not present

## 2020-11-09 DIAGNOSIS — F01518 Vascular dementia, unspecified severity, with other behavioral disturbance: Secondary | ICD-10-CM | POA: Diagnosis not present

## 2020-11-09 DIAGNOSIS — F02818 Dementia in other diseases classified elsewhere, unspecified severity, with other behavioral disturbance: Secondary | ICD-10-CM | POA: Diagnosis not present

## 2020-11-09 DIAGNOSIS — I10 Essential (primary) hypertension: Secondary | ICD-10-CM | POA: Diagnosis not present

## 2020-11-09 DIAGNOSIS — E785 Hyperlipidemia, unspecified: Secondary | ICD-10-CM | POA: Diagnosis not present

## 2020-11-11 DIAGNOSIS — I6529 Occlusion and stenosis of unspecified carotid artery: Secondary | ICD-10-CM | POA: Diagnosis not present

## 2020-11-11 DIAGNOSIS — F02818 Dementia in other diseases classified elsewhere, unspecified severity, with other behavioral disturbance: Secondary | ICD-10-CM | POA: Diagnosis not present

## 2020-11-11 DIAGNOSIS — E785 Hyperlipidemia, unspecified: Secondary | ICD-10-CM | POA: Diagnosis not present

## 2020-11-11 DIAGNOSIS — F01518 Vascular dementia, unspecified severity, with other behavioral disturbance: Secondary | ICD-10-CM | POA: Diagnosis not present

## 2020-11-11 DIAGNOSIS — E039 Hypothyroidism, unspecified: Secondary | ICD-10-CM | POA: Diagnosis not present

## 2020-11-11 DIAGNOSIS — I2510 Atherosclerotic heart disease of native coronary artery without angina pectoris: Secondary | ICD-10-CM | POA: Diagnosis not present

## 2020-11-11 DIAGNOSIS — G309 Alzheimer's disease, unspecified: Secondary | ICD-10-CM | POA: Diagnosis not present

## 2020-11-11 DIAGNOSIS — I10 Essential (primary) hypertension: Secondary | ICD-10-CM | POA: Diagnosis not present

## 2020-11-11 DIAGNOSIS — I739 Peripheral vascular disease, unspecified: Secondary | ICD-10-CM | POA: Diagnosis not present

## 2020-11-12 ENCOUNTER — Encounter: Payer: Self-pay | Admitting: Internal Medicine

## 2020-11-12 NOTE — Assessment & Plan Note (Signed)
Known CAD.  S/p DES - RCA.  Continue plavix.  Sees cardiology.  Intolerant to statin medication.  Stable.  Follow.   

## 2020-11-12 NOTE — Assessment & Plan Note (Signed)
Had intolerance to statin medication.  Continue current blood pressure medication.   

## 2020-11-12 NOTE — Assessment & Plan Note (Signed)
Continue lisinopril and metoprolol.  Blood pressure doing well.  Follow pressures.  Follow metabolic panel.  

## 2020-11-12 NOTE — Telephone Encounter (Signed)
Working with [employer].  Husband is interested to see if can get aid, where he can do some errands occasionally.  Also interested in getting meals on wheels.  Need information to get this arranged.

## 2020-11-12 NOTE — Assessment & Plan Note (Signed)
Intolerant to statin medication.  Not taking zetia.  Low cholesterol diet and exercise.  Follow lipid panel.  

## 2020-11-12 NOTE — Assessment & Plan Note (Signed)
Continues on aricept.  Husband reported worsening memory issues.  Have discussed addition of namenda.  Request formal neurology evaluation.  Social work - Administrator, sports - recent fall, ER visits.  Needs help in the home. Would like to be able to get away periodically to do errands, etc.  Also interested in Meals on Wheels.   Has [employer] policy for home care.  D/w social work regarding resources available.  Contact person - grandson Gerlene Burdock [phone]) and granddaughter:  [name] [phone].

## 2020-11-12 NOTE — Assessment & Plan Note (Signed)
Follow cbc.  

## 2020-11-12 NOTE — Assessment & Plan Note (Signed)
On thyroid replacement.  Follow tsh.  

## 2020-11-12 NOTE — Assessment & Plan Note (Signed)
On no medication.  Low carb diet and exercise.  Follow met b and a1c.   

## 2020-11-13 NOTE — Telephone Encounter (Signed)
Noted. Please notify Mr Schutt.

## 2020-11-13 NOTE — Telephone Encounter (Signed)
Meals on wheels referral has been submitted. Spoke with [employer] to see if able to get aid, advised that they are unable to provide aid services at this time due to staffing. They are looking for someone to fill this position.

## 2020-11-14 DIAGNOSIS — G309 Alzheimer's disease, unspecified: Secondary | ICD-10-CM | POA: Diagnosis not present

## 2020-11-14 DIAGNOSIS — E039 Hypothyroidism, unspecified: Secondary | ICD-10-CM | POA: Diagnosis not present

## 2020-11-14 DIAGNOSIS — I739 Peripheral vascular disease, unspecified: Secondary | ICD-10-CM | POA: Diagnosis not present

## 2020-11-14 DIAGNOSIS — I6529 Occlusion and stenosis of unspecified carotid artery: Secondary | ICD-10-CM | POA: Diagnosis not present

## 2020-11-14 DIAGNOSIS — F01518 Vascular dementia, unspecified severity, with other behavioral disturbance: Secondary | ICD-10-CM | POA: Diagnosis not present

## 2020-11-14 DIAGNOSIS — I10 Essential (primary) hypertension: Secondary | ICD-10-CM | POA: Diagnosis not present

## 2020-11-14 DIAGNOSIS — I2510 Atherosclerotic heart disease of native coronary artery without angina pectoris: Secondary | ICD-10-CM | POA: Diagnosis not present

## 2020-11-14 DIAGNOSIS — F02818 Dementia in other diseases classified elsewhere, unspecified severity, with other behavioral disturbance: Secondary | ICD-10-CM | POA: Diagnosis not present

## 2020-11-14 DIAGNOSIS — E785 Hyperlipidemia, unspecified: Secondary | ICD-10-CM | POA: Diagnosis not present

## 2020-11-14 NOTE — Telephone Encounter (Signed)
Called patient. Unable to leave message.

## 2020-11-16 DIAGNOSIS — I2510 Atherosclerotic heart disease of native coronary artery without angina pectoris: Secondary | ICD-10-CM | POA: Diagnosis not present

## 2020-11-16 DIAGNOSIS — E785 Hyperlipidemia, unspecified: Secondary | ICD-10-CM | POA: Diagnosis not present

## 2020-11-16 DIAGNOSIS — F01518 Vascular dementia, unspecified severity, with other behavioral disturbance: Secondary | ICD-10-CM | POA: Diagnosis not present

## 2020-11-16 DIAGNOSIS — I739 Peripheral vascular disease, unspecified: Secondary | ICD-10-CM | POA: Diagnosis not present

## 2020-11-16 DIAGNOSIS — I10 Essential (primary) hypertension: Secondary | ICD-10-CM | POA: Diagnosis not present

## 2020-11-16 DIAGNOSIS — E039 Hypothyroidism, unspecified: Secondary | ICD-10-CM | POA: Diagnosis not present

## 2020-11-16 DIAGNOSIS — G309 Alzheimer's disease, unspecified: Secondary | ICD-10-CM | POA: Diagnosis not present

## 2020-11-16 DIAGNOSIS — F02818 Dementia in other diseases classified elsewhere, unspecified severity, with other behavioral disturbance: Secondary | ICD-10-CM | POA: Diagnosis not present

## 2020-11-16 DIAGNOSIS — I6529 Occlusion and stenosis of unspecified carotid artery: Secondary | ICD-10-CM | POA: Diagnosis not present

## 2020-11-19 DIAGNOSIS — E039 Hypothyroidism, unspecified: Secondary | ICD-10-CM | POA: Diagnosis not present

## 2020-11-19 DIAGNOSIS — F02818 Dementia in other diseases classified elsewhere, unspecified severity, with other behavioral disturbance: Secondary | ICD-10-CM | POA: Diagnosis not present

## 2020-11-19 DIAGNOSIS — I739 Peripheral vascular disease, unspecified: Secondary | ICD-10-CM | POA: Diagnosis not present

## 2020-11-19 DIAGNOSIS — I10 Essential (primary) hypertension: Secondary | ICD-10-CM | POA: Diagnosis not present

## 2020-11-19 DIAGNOSIS — I6529 Occlusion and stenosis of unspecified carotid artery: Secondary | ICD-10-CM | POA: Diagnosis not present

## 2020-11-19 DIAGNOSIS — E785 Hyperlipidemia, unspecified: Secondary | ICD-10-CM | POA: Diagnosis not present

## 2020-11-19 DIAGNOSIS — I2510 Atherosclerotic heart disease of native coronary artery without angina pectoris: Secondary | ICD-10-CM | POA: Diagnosis not present

## 2020-11-19 DIAGNOSIS — F01518 Vascular dementia, unspecified severity, with other behavioral disturbance: Secondary | ICD-10-CM | POA: Diagnosis not present

## 2020-11-19 DIAGNOSIS — G309 Alzheimer's disease, unspecified: Secondary | ICD-10-CM | POA: Diagnosis not present

## 2020-11-21 DIAGNOSIS — I6529 Occlusion and stenosis of unspecified carotid artery: Secondary | ICD-10-CM | POA: Diagnosis not present

## 2020-11-21 DIAGNOSIS — F01518 Vascular dementia, unspecified severity, with other behavioral disturbance: Secondary | ICD-10-CM | POA: Diagnosis not present

## 2020-11-21 DIAGNOSIS — G309 Alzheimer's disease, unspecified: Secondary | ICD-10-CM | POA: Diagnosis not present

## 2020-11-21 DIAGNOSIS — E785 Hyperlipidemia, unspecified: Secondary | ICD-10-CM | POA: Diagnosis not present

## 2020-11-21 DIAGNOSIS — I739 Peripheral vascular disease, unspecified: Secondary | ICD-10-CM | POA: Diagnosis not present

## 2020-11-21 DIAGNOSIS — F0280 Dementia in other diseases classified elsewhere without behavioral disturbance: Secondary | ICD-10-CM | POA: Diagnosis not present

## 2020-11-21 DIAGNOSIS — F0150 Vascular dementia without behavioral disturbance: Secondary | ICD-10-CM | POA: Diagnosis not present

## 2020-11-21 DIAGNOSIS — E039 Hypothyroidism, unspecified: Secondary | ICD-10-CM | POA: Diagnosis not present

## 2020-11-21 DIAGNOSIS — I10 Essential (primary) hypertension: Secondary | ICD-10-CM | POA: Diagnosis not present

## 2020-11-21 DIAGNOSIS — F02818 Dementia in other diseases classified elsewhere, unspecified severity, with other behavioral disturbance: Secondary | ICD-10-CM | POA: Diagnosis not present

## 2020-11-21 DIAGNOSIS — I2510 Atherosclerotic heart disease of native coronary artery without angina pectoris: Secondary | ICD-10-CM | POA: Diagnosis not present

## 2020-11-22 ENCOUNTER — Other Ambulatory Visit: Payer: Self-pay | Admitting: Internal Medicine

## 2020-11-22 DIAGNOSIS — Z1231 Encounter for screening mammogram for malignant neoplasm of breast: Secondary | ICD-10-CM

## 2020-11-24 DIAGNOSIS — I2510 Atherosclerotic heart disease of native coronary artery without angina pectoris: Secondary | ICD-10-CM | POA: Diagnosis not present

## 2020-11-24 DIAGNOSIS — F02818 Dementia in other diseases classified elsewhere, unspecified severity, with other behavioral disturbance: Secondary | ICD-10-CM | POA: Diagnosis not present

## 2020-11-24 DIAGNOSIS — F01518 Vascular dementia, unspecified severity, with other behavioral disturbance: Secondary | ICD-10-CM | POA: Diagnosis not present

## 2020-11-24 DIAGNOSIS — E785 Hyperlipidemia, unspecified: Secondary | ICD-10-CM | POA: Diagnosis not present

## 2020-11-24 DIAGNOSIS — E039 Hypothyroidism, unspecified: Secondary | ICD-10-CM | POA: Diagnosis not present

## 2020-11-24 DIAGNOSIS — I6529 Occlusion and stenosis of unspecified carotid artery: Secondary | ICD-10-CM | POA: Diagnosis not present

## 2020-11-24 DIAGNOSIS — I739 Peripheral vascular disease, unspecified: Secondary | ICD-10-CM | POA: Diagnosis not present

## 2020-11-24 DIAGNOSIS — I10 Essential (primary) hypertension: Secondary | ICD-10-CM | POA: Diagnosis not present

## 2020-11-24 DIAGNOSIS — G309 Alzheimer's disease, unspecified: Secondary | ICD-10-CM | POA: Diagnosis not present

## 2020-12-07 DIAGNOSIS — I6529 Occlusion and stenosis of unspecified carotid artery: Secondary | ICD-10-CM | POA: Diagnosis not present

## 2020-12-07 DIAGNOSIS — I739 Peripheral vascular disease, unspecified: Secondary | ICD-10-CM | POA: Diagnosis not present

## 2020-12-07 DIAGNOSIS — F02818 Dementia in other diseases classified elsewhere, unspecified severity, with other behavioral disturbance: Secondary | ICD-10-CM | POA: Diagnosis not present

## 2020-12-07 DIAGNOSIS — F01518 Vascular dementia, unspecified severity, with other behavioral disturbance: Secondary | ICD-10-CM | POA: Diagnosis not present

## 2020-12-07 DIAGNOSIS — G309 Alzheimer's disease, unspecified: Secondary | ICD-10-CM | POA: Diagnosis not present

## 2020-12-07 DIAGNOSIS — I10 Essential (primary) hypertension: Secondary | ICD-10-CM | POA: Diagnosis not present

## 2020-12-07 DIAGNOSIS — E039 Hypothyroidism, unspecified: Secondary | ICD-10-CM | POA: Diagnosis not present

## 2020-12-07 DIAGNOSIS — E785 Hyperlipidemia, unspecified: Secondary | ICD-10-CM | POA: Diagnosis not present

## 2020-12-07 DIAGNOSIS — I2510 Atherosclerotic heart disease of native coronary artery without angina pectoris: Secondary | ICD-10-CM | POA: Diagnosis not present

## 2020-12-19 ENCOUNTER — Other Ambulatory Visit: Payer: Self-pay

## 2020-12-19 ENCOUNTER — Ambulatory Visit
Admission: RE | Admit: 2020-12-19 | Discharge: 2020-12-19 | Disposition: A | Discharge status: Home / Self Care | Payer: Medicare PPO | Source: Ambulatory Visit | Attending: Internal Medicine | Admitting: Internal Medicine

## 2020-12-19 DIAGNOSIS — Z1231 Encounter for screening mammogram for malignant neoplasm of breast: Secondary | ICD-10-CM | POA: Diagnosis not present

## 2021-01-02 DIAGNOSIS — F0280 Dementia in other diseases classified elsewhere without behavioral disturbance: Secondary | ICD-10-CM | POA: Diagnosis not present

## 2021-01-02 DIAGNOSIS — G309 Alzheimer's disease, unspecified: Secondary | ICD-10-CM | POA: Diagnosis not present

## 2021-01-02 DIAGNOSIS — F0150 Vascular dementia without behavioral disturbance: Secondary | ICD-10-CM | POA: Diagnosis not present

## 2021-01-07 ENCOUNTER — Other Ambulatory Visit: Payer: Self-pay | Admitting: Internal Medicine

## 2021-01-08 DIAGNOSIS — I2510 Atherosclerotic heart disease of native coronary artery without angina pectoris: Secondary | ICD-10-CM | POA: Diagnosis not present

## 2021-01-08 DIAGNOSIS — I6523 Occlusion and stenosis of bilateral carotid arteries: Secondary | ICD-10-CM | POA: Diagnosis not present

## 2021-01-08 DIAGNOSIS — I10 Essential (primary) hypertension: Secondary | ICD-10-CM | POA: Diagnosis not present

## 2021-01-08 DIAGNOSIS — E119 Type 2 diabetes mellitus without complications: Secondary | ICD-10-CM | POA: Diagnosis not present

## 2021-01-08 DIAGNOSIS — I739 Peripheral vascular disease, unspecified: Secondary | ICD-10-CM | POA: Diagnosis not present

## 2021-02-09 ENCOUNTER — Other Ambulatory Visit: Payer: Self-pay | Admitting: Internal Medicine

## 2021-02-19 ENCOUNTER — Ambulatory Visit: Payer: Medicare PPO | Admitting: Internal Medicine

## 2021-02-19 ENCOUNTER — Other Ambulatory Visit: Payer: Self-pay

## 2021-02-19 VITALS — BP 120/72 | HR 66 | Temp 97.9°F | Resp 16 | Ht 63.0 in | Wt 157.0 lb

## 2021-02-19 DIAGNOSIS — I10 Essential (primary) hypertension: Secondary | ICD-10-CM | POA: Diagnosis not present

## 2021-02-19 DIAGNOSIS — F0390 Unspecified dementia without behavioral disturbance: Secondary | ICD-10-CM

## 2021-02-19 DIAGNOSIS — E1159 Type 2 diabetes mellitus with other circulatory complications: Secondary | ICD-10-CM | POA: Diagnosis not present

## 2021-02-19 DIAGNOSIS — E039 Hypothyroidism, unspecified: Secondary | ICD-10-CM | POA: Diagnosis not present

## 2021-02-19 DIAGNOSIS — E7800 Pure hypercholesterolemia, unspecified: Secondary | ICD-10-CM

## 2021-02-19 DIAGNOSIS — I2510 Atherosclerotic heart disease of native coronary artery without angina pectoris: Secondary | ICD-10-CM

## 2021-02-19 DIAGNOSIS — I700 Atherosclerosis of aorta: Secondary | ICD-10-CM | POA: Diagnosis not present

## 2021-02-19 NOTE — Progress Notes (Signed)
Patient ID: NYIA TSAO, female   DOB: 08/10/1933, 88 y.o.   MRN: [id number]   Subjective:    Patient ID: [name], female    DOB: 10-13-1933, 88 y.o.   MRN: [id number]  This visit occurred during the SARS-CoV-2 public health emergency.  Safety protocols were in place, including screening questions prior to the visit, additional usage of staff PPE, and extensive cleaning of exam room while observing appropriate contact time as indicated for disinfecting solutions.   Patient here for a scheduled follow up.   Chief Complaint  Patient presents with   Dementia   .   HPI Accompanied by her husband and caretaker.  Has seen neurology previously.  On namenda and aricept.  Namenda recently increased to 82m bid.  Memory changes.  At Christmas - found in a neighbor's house.  She wandered outside with no shoes, no coat.  Was found inside a neighbor's house.  Has homecare now.  Sundowning is worse.  Discussed with husband.  Needs further assistance in the home and is interested in placement - memory care.  Saw neurology - discussed - seroquel.  Concern regarding taking because of side effects.  Asking about other treatment.  Saw cardiology 01/08/21.  Recommended continuing aspirin.  Stable.    Past Medical History:  Diagnosis Date   Carotid bruit    bilaterally   [place] ([place]    Hypercholesterolemia    Hypertension    Hypothyroidism    Past Surgical History:  Procedure Laterality Date   CARPAL TUNNEL RELEASE     CHOLECYSTECTOMY  1991   CORONARY/GRAFT ACUTE MI REVASCULARIZATION N/A 01/21/2019   Procedure: Coronary/Graft Acute MI Revascularization;  Surgeon: CYolonda Kida MD;  [facility];  Service: Cardiovascular;  Laterality: N/A;   LEFT HEART CATH AND CORONARY ANGIOGRAPHY N/A 01/21/2019   Procedure: LEFT HEART CATH AND CORONARY ANGIOGRAPHY;  Surgeon: CYolonda Kida MD;  [facility];  Service: Cardiovascular;  Laterality: N/A;   THORACIC  AORTA STENT  12/13   heart stent   Family History  Problem Relation Age of Onset   Hypertension Father    Lung cancer Father    Hypertension Mother    Dementia Mother    Arthritis Other    Breast cancer Cousin        mat cousin   Colon cancer Neg Hx    Social History   Socioeconomic History   Marital status: Married    Spouse name: BKharma Sampsel  Number of children: 2   Years of education: 16   Highest education level: Bachelor's degree (e.g., BA, AB, BS)  Occupational History   Occupation: Retired SEducation officer, museum Tobacco Use   Smoking status: Never   Smokeless tobacco: Never  Vaping Use   Vaping Use: Never used  Substance and Sexual Activity   Alcohol use: No    Alcohol/week: 0.0 standard drinks   Drug use: No   Sexual activity: Not Currently  Other Topics Concern   Not on file  Social History Narrative   She is an only child. 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   Social Determinants of Health   Financial Resource Strain: Low Risk    Difficulty of Paying Living Expenses: Not hard at all  Food Insecurity: No Food Insecurity   Worried About RCharity fundraiserin the Last Year: Never true   [place]  Last Year: Never true  Transportation Needs: No Transportation Needs   Lack of Transportation (Medical): No   Lack of Transportation (Non-Medical): No  Physical Activity: Inactive   Days of Exercise per Week: 0 days   Minutes of Exercise per Session: 0 min  Stress: No Stress Concern Present   Feeling of Stress : Not at all  Social Connections: Moderately Isolated   Frequency of Communication with Friends and Family: More than three times a week   Frequency of Social Gatherings with Friends and Family: More than three times a week   Attends Religious Services: Never   Marine scientist or Organizations: No   Attends Archivist Meetings: Never   Marital Status: Married     Review of Systems   Constitutional:  Negative for appetite change and unexpected weight change.  HENT:  Negative for congestion and sinus pressure.   Respiratory:  Negative for cough, chest tightness and shortness of breath.   Cardiovascular:  Negative for chest pain and palpitations.       No increased swelling.   Gastrointestinal:  Negative for abdominal pain, diarrhea, nausea and vomiting.  Genitourinary:  Negative for difficulty urinating and dysuria.  Musculoskeletal:  Negative for joint swelling and myalgias.  Skin:  Negative for color change and rash.  Neurological:  Negative for dizziness, light-headedness and headaches.  Psychiatric/Behavioral:  Negative for agitation and dysphoric mood.       Objective:     BP 120/72    Pulse 66    Temp 97.9 F (36.6 C)    Resp 16    Ht _0  (1.6 m)    Wt 157 lb (71.2 kg)    SpO2 98%    BMI 27.81 kg/m  Wt Readings from Last 3 Encounters:  02/19/21 157 lb (71.2 kg)  11/08/20 163 lb (73.9 kg)  10/29/20 153 lb (69.4 kg)    Physical Exam Vitals reviewed.  Constitutional:      General: She is not in acute distress.    Appearance: Normal appearance.  HENT:     Head: Normocephalic and atraumatic.     Right Ear: External ear normal.     Left Ear: External ear normal.  Eyes:     General: No scleral icterus.       Right eye: No discharge.        Left eye: No discharge.     Conjunctiva/sclera: Conjunctivae normal.  Neck:     Thyroid: No thyromegaly.  Cardiovascular:     Rate and Rhythm: Normal rate and regular rhythm.  Pulmonary:     Effort: No respiratory distress.     Breath sounds: Normal breath sounds. No wheezing.  Abdominal:     General: Bowel sounds are normal.     Palpations: Abdomen is soft.     Tenderness: There is no abdominal tenderness.  Musculoskeletal:        General: No swelling or tenderness.     Cervical back: Neck supple. No tenderness.  Lymphadenopathy:     Cervical: No cervical adenopathy.  Skin:    Findings: No erythema or  rash.  Neurological:     Mental Status: She is alert.  Psychiatric:        Mood and Affect: Mood normal.        Behavior: Behavior normal.     Outpatient Encounter Medications as of 02/19/2021  Medication Sig   aspirin 81 MG chewable tablet Chew 1 tablet (81 mg total) by mouth daily.  donepezil (ARICEPT) 10 MG tablet TAKE ONE TABLET AT BEDTIME.   levothyroxine (SYNTHROID) 75 MCG tablet TAKE ONE-HALF TABLET EVERY DAY   lisinopril (ZESTRIL) 20 MG tablet TAKE (1) TABLET BY MOUTH EVERY DAY   memantine (NAMENDA) 5 MG tablet Take 2 tablets by mouth at bedtime.   metoprolol tartrate (LOPRESSOR) 25 MG tablet TAKE (1) TABLET BY MOUTH TWICE DAILY   Syringe/Needle, Disp, (SYRINGE 3CC/25GX1") 25G X 1" 3 ML MISC Use as directed to administer b12 injections.   [DISCONTINUED] clopidogrel (PLAVIX) 75 MG tablet Take 1 tablet (75 mg total) by mouth daily with breakfast.   No facility-administered encounter medications on file as of 02/19/2021.     Lab Results  Component Value Date   WBC 10.8 (H) 11/08/2020   HGB 14.3 11/08/2020   HCT 44.4 11/08/2020   PLT 364.0 11/08/2020   GLUCOSE 105 (H) 02/19/2021   CHOL 235 (H) 11/08/2020   TRIG 192.0 (H) 11/08/2020   HDL 39.00 (L) 11/08/2020   LDLDIRECT 164.0 10/12/2019   LDLCALC 158 (H) 11/08/2020   ALT 14 02/19/2021   AST 17 02/19/2021   NA 143 02/19/2021   K 4.2 02/19/2021   CL 104 02/19/2021   CREATININE 1.15 02/19/2021   BUN 15 02/19/2021   CO2 31 02/19/2021   TSH 2.74 11/08/2020   INR 1.0 01/21/2019   HGBA1C 6.4 02/19/2021   MICROALBUR 0.3 05/25/2020    MM 3D SCREEN BREAST BILATERAL  Result Date: 12/19/2020 CLINICAL DATA:  Screening. EXAM: DIGITAL SCREENING BILATERAL MAMMOGRAM WITH TOMOSYNTHESIS AND CAD TECHNIQUE: Bilateral screening digital craniocaudal and mediolateral oblique mammograms were obtained. Bilateral screening digital breast tomosynthesis was performed. The images were evaluated with computer-aided detection. COMPARISON:   Previous exam(s). ACR Breast Density Category b: There are scattered areas of fibroglandular density. FINDINGS: There are no findings suspicious for malignancy. IMPRESSION: No mammographic evidence of malignancy. A result letter of this screening mammogram will be mailed directly to the patient. RECOMMENDATION: Screening mammogram in one year. (Code:SM-B-01Y) BI-RADS CATEGORY  1: Negative. Electronically Signed   By: Abelardo Diesel M.D.   On: 12/19/2020 15:19      Assessment & Plan:   Problem List Items Addressed This Visit     Aortic atherosclerosis ([place])    Had intolerance to statin medication.  Continue current blood pressure medication.        CAD (coronary artery disease)    Known CAD.  S/p DES - RCA.  Continue plavix.  Sees cardiology.  Intolerant to statin medication.  Stable.  Follow.        Dementia without behavioral disturbance ([place])    Wandering off at Christmas.  Worsening.  On aricept and namenda.  namenda recently increased to 21m bid.  Discussed assisted living - memory care.  CCM referral for help with options, placement, etc.        Relevant Orders   AMB Referral to [facility]   Diabetes mellitus with [place]atory complication ([place]    On no medication.  Low carb diet and exercise.  Follow met b and a1c.        Relevant Orders   Hemoglobin A1c (Completed)   Hypercholesteremia - Primary    Intolerant to statin medication.  Not taking zetia.  Low cholesterol diet and exercise.  Follow lipid panel.       Relevant Orders   Basic metabolic panel (Completed)   Hepatic function panel (Completed)   Hypertension, essential    Continue lisinopril and metoprolol.  Blood pressure  doing well.  Follow pressures.  Follow metabolic panel.       Hypothyroidism    On thyroid replacement.  Follow tsh.         Einar Pheasant, MD

## 2021-02-20 LAB — HEPATIC FUNCTION PANEL
ALT: 14 U/L (ref 0–35)
AST: 17 U/L (ref 0–37)
Albumin: 3.8 g/dL (ref 3.5–5.2)
Alkaline Phosphatase: 91 U/L (ref 39–117)
Bilirubin, Direct: 0.1 mg/dL (ref 0.0–0.3)
Total Bilirubin: 0.4 mg/dL (ref 0.2–1.2)
Total Protein: 6.5 g/dL (ref 6.0–8.3)

## 2021-02-20 LAB — BASIC METABOLIC PANEL
BUN: 15 mg/dL (ref 6–23)
CO2: 31 mEq/L (ref 19–32)
Calcium: 9.7 mg/dL (ref 8.4–10.5)
Chloride: 104 mEq/L (ref 96–112)
Creatinine, Ser: 1.15 mg/dL (ref 0.40–1.20)
GFR: 43.35 mL/min — ABNORMAL LOW (ref >60.00)
Glucose, Bld: 105 mg/dL — ABNORMAL HIGH (ref 70–99)
Potassium: 4.2 mEq/L (ref 3.5–5.1)
Sodium: 143 mEq/L (ref 135–145)

## 2021-02-20 LAB — HEMOGLOBIN A1C: Hgb A1c MFr Bld: 6.4 % (ref 4.6–6.5)

## 2021-02-24 ENCOUNTER — Encounter: Payer: Self-pay | Admitting: Internal Medicine

## 2021-02-24 NOTE — Assessment & Plan Note (Signed)
Known CAD.  S/p DES - RCA.  Continue plavix.  Sees cardiology.  Intolerant to statin medication.  Stable.  Follow.   

## 2021-02-24 NOTE — Assessment & Plan Note (Signed)
On thyroid replacement.  Follow tsh.  

## 2021-02-24 NOTE — Assessment & Plan Note (Signed)
Intolerant to statin medication.  Not taking zetia.  Low cholesterol diet and exercise.  Follow lipid panel.  

## 2021-02-24 NOTE — Assessment & Plan Note (Signed)
Had intolerance to statin medication.  Continue current blood pressure medication.   

## 2021-02-24 NOTE — Assessment & Plan Note (Signed)
Continue lisinopril and metoprolol.  Blood pressure doing well.  Follow pressures.  Follow metabolic panel.  

## 2021-02-24 NOTE — Assessment & Plan Note (Signed)
On no medication.  Low carb diet and exercise.  Follow met b and a1c.   

## 2021-02-24 NOTE — Assessment & Plan Note (Signed)
Wandering off at Christmas.  Worsening.  On aricept and namenda.  namenda recently increased to 10mg  bid.  Discussed assisted living - memory care.  CCM referral for help with options, placement, etc.

## 2021-03-01 ENCOUNTER — Ambulatory Visit (INDEPENDENT_AMBULATORY_CARE_PROVIDER_SITE_OTHER): Payer: Medicare PPO | Admitting: *Deleted

## 2021-03-01 DIAGNOSIS — E1159 Type 2 diabetes mellitus with other circulatory complications: Secondary | ICD-10-CM

## 2021-03-01 DIAGNOSIS — F0390 Unspecified dementia without behavioral disturbance: Secondary | ICD-10-CM

## 2021-03-01 DIAGNOSIS — I10 Essential (primary) hypertension: Secondary | ICD-10-CM

## 2021-03-01 NOTE — Chronic Care Management (AMB) (Signed)
Chronic Care Management    Clinical Social Work Note  03/01/2021 Name: JACKELINE GUTKNECHT MRN: [id number] DOB: 1933-04-19  DEISI SALONGA is a 88 y.o. year old female who is a primary care patient of [place], MD. The CCM team was consulted to assist the patient with chronic disease management and/or care coordination needs related to: Level of Care Concerns.   Collaboration with patient's spouse  for follow up visit in response to provider referral for social work chronic care management and care coordination services.   Consent to Services:  The patient was given information about Chronic Care Management services, agreed to services, and gave verbal consent prior to initiation of services.  Please see initial visit note for detailed documentation.   Patient agreed to services and consent obtained.   Assessment: Review of patient past medical history, allergies, medications, and health status, including review of relevant consultants reports was performed today as part of a comprehensive evaluation and provision of chronic care management and care coordination services.     SDOH (Social Determinants of Health) assessments and interventions performed:    Advanced Directives Status: Not addressed in this encounter.  CCM Care Plan  Allergies  Allergen Reactions   Atorvastatin Other (See Comments)    Muscle Pain   Sulfa Antibiotics     Outpatient Encounter Medications as of 03/01/2021  Medication Sig   aspirin 81 MG chewable tablet Chew 1 tablet (81 mg total) by mouth daily.   donepezil (ARICEPT) 10 MG tablet TAKE ONE TABLET AT BEDTIME.   levothyroxine (SYNTHROID) 75 MCG tablet TAKE ONE-HALF TABLET EVERY DAY   lisinopril (ZESTRIL) 20 MG tablet TAKE (1) TABLET BY MOUTH EVERY DAY   memantine (NAMENDA) 5 MG tablet Take 2 tablets by mouth at bedtime.   metoprolol tartrate (LOPRESSOR) 25 MG tablet TAKE (1) TABLET BY MOUTH TWICE DAILY   Syringe/Needle, Disp, (SYRINGE 3CC/25GX1") 25G X 1" 3  ML MISC Use as directed to administer b12 injections.   No facility-administered encounter medications on file as of 03/01/2021.    Patient Active Problem List   Diagnosis Date Noted   Aortic atherosclerosis (HCC) 08/03/2020   Renal cyst 02/01/2019   Anemia 02/01/2019   Acute ST elevation myocardial infarction (STEMI) of inferior wall [facility])    STEMI involving right coronary artery (HCC) 01/21/2019   Unstable angina pectoris (HCC) 01/19/2019   Abdominal bruit 09/14/2017   Muscle ache 03/06/2017   Fall 12/31/2014   Health care maintenance 05/20/2014   Diabetes mellitus with circulatory complication (HCC) 05/17/2013   CAD (coronary artery disease) 05/15/2012   Osteopenia 12/14/2011   Hypertension, essential 11/13/2011   Hypercholesteremia 11/13/2011   Dementia without behavioral disturbance (HCC) 11/13/2011   Hypothyroidism 11/13/2011    Conditions to be addressed/monitored:  Dementia.  Limited Social Support, Level of Care Concerns, Limited Access to Caregiver, Cognitive Deficits, Memory Deficits, Limited Ability to Perform ADL's/IADL's and Caregiver Stress. Care Plan : LCSW Plan of Care  Updates made by Wenda Overland, LCSW since 03/01/2021 12:00 AM     Problem: Maintain My Quality of Life through [facility].   Priority: High     Goal: Maintain My Quality of Life through [facility].   Start Date: 06/11/2020  Recent Progress: On track  Priority: High  Note:   Current Barriers:   Patient with Dementia without Behavioral Disturbance, Diabetes Mellitus with Circulatory Complication, History of Falls/Fall Risk, Osteopenia and Hypertension needs support, education, and care coordination to resolve unmet personal  care needs in the home. Patient is unable to self-administer medications as prescribed. Patient is unable to consistently perform ADL's/IADL's independently. Level of care concerns. Lacks knowledge of available community agencies and  resources. Clinical Goals:  Over the next 30 days, patient will have  face to face assessment completed through [name]-assessment scheduled 03/21/21 Patient will attend all scheduled medical appointments as evidenced by patient report and care team review of appointment completion in electronic medical record. Patient will demonstrate improved health management independence as evidenced by having in-home care services in place. Clinical Interventions: Follow up  phone call to patient's spouse who confirms that patient's condition continues to progress and increased care is now indicated. Patient's spouse confirms their grandson resides with them and is able to assist with patient's care in the evenings, however patient's condition is becoming difficult to manage, specifically in the evenings Per patient's spouse, Alarm system installed to increase safety and manage patient's tendency to wander Spouse confirms that he is paying out of pocket for in home care([facility]) at this time to assist with patient's care daily from 2-6pm, however has an appointment scheduled with patient's long term care policy agency([employer]) on 03/21/21 to complete assessment . Patient's granddaughter will be available for appointment and is planning a visit next week to organize documents received from [facility] and to assist with completing any additional documentation. Provided education to patient's spouse, Elyce Zollinger regarding level of care options-Patient's spouse confirms that he would like to keep patient in the home and has declined out of home care options at this time. He is hopeful that following the face to face assessment with [employer] that in home care will begin to be covered by them. Caregiver stress acknowledged, emotional support provided Discussed plans with patient and husband, Rossana Molchan for ongoing care management follow-up and provided direct  contact information for care management team. Patient Goals/Self-Care Activities:  [facility] [phone]) Policy # [id number]; Issued: 10/04/1996.  Policy coverage is for both patient and husband, Gabi Mcfate.    Virtual Face to Face scheduled with patient and [employer] company on 03/21/21 to complete assessment for in home care Contact LCSW directly (# [phone]) if you have questions or if additional social work needs arise in the near future.        Follow Up Plan: Appointment scheduled for SW follow up with client by phone on: 03/22/21      Verna Czech, LCSW [employer] [phone]

## 2021-03-01 NOTE — Patient Instructions (Signed)
Visit Information  Thank you for taking time to visit with me today. Please don't hesitate to contact me if I can be of assistance to you before our next scheduled telephone appointment.  Following are the goals we discussed today:  [facility] [phone]) Policy # [id number]; Issued: 10/04/1996.  Policy coverage is for both patient and husband, Auri Jahnke.    Virtual Face to Face scheduled with patient and [employer] company on 03/21/21 to complete assessment for in home care Contact LCSW directly (# [phone]) if you have questions or if additional social work needs arise in the near future.   Our next appointment is by telephone on 03/22/21 at 1:45pm  Please call the care guide team at [phone] if you need to cancel or reschedule your appointment.   If you are experiencing a Mental Health or Behavioral Health Crisis or need someone to talk to, please call the Suicide and Crisis Lifeline: 988   Patient verbalizes understanding of instructions and care plan provided today and agrees to view in MyChart. Active MyChart status confirmed with patient.    Telephone follow up appointment with care management team member scheduled for: 03/22/21  Verna Czech, LCSW [employer] [phone]

## 2021-03-07 ENCOUNTER — Ambulatory Visit: Payer: Medicare PPO

## 2021-03-07 ENCOUNTER — Other Ambulatory Visit: Payer: Self-pay

## 2021-03-11 ENCOUNTER — Telehealth: Payer: Self-pay

## 2021-03-11 NOTE — Telephone Encounter (Signed)
FL2 completed and placed out for your signature so I can send to them tomorrow morning.

## 2021-03-11 NOTE — Telephone Encounter (Signed)
FL2 signed and placed in box.

## 2021-03-12 ENCOUNTER — Ambulatory Visit (INDEPENDENT_AMBULATORY_CARE_PROVIDER_SITE_OTHER): Payer: Medicare PPO

## 2021-03-12 ENCOUNTER — Other Ambulatory Visit: Payer: Self-pay

## 2021-03-12 DIAGNOSIS — Z111 Encounter for screening for respiratory tuberculosis: Secondary | ICD-10-CM | POA: Diagnosis not present

## 2021-03-12 NOTE — Telephone Encounter (Signed)
FL2 faxed 

## 2021-03-12 NOTE — Progress Notes (Signed)
Pt presented today for PPD placement. Right arm, Intradermal. Pt voiced no concerns nor showed any signs of distress during placement.  Pt's husband is aware to return on Thursday at 10:30 am and not any sooner for the reading.

## 2021-03-14 ENCOUNTER — Other Ambulatory Visit: Payer: Self-pay

## 2021-03-14 ENCOUNTER — Ambulatory Visit: Payer: Medicare PPO | Admitting: *Deleted

## 2021-03-14 DIAGNOSIS — Z111 Encounter for screening for respiratory tuberculosis: Secondary | ICD-10-CM

## 2021-03-14 LAB — TB SKIN TEST
Induration: 0 mm
TB Skin Test: NEGATIVE

## 2021-03-14 NOTE — Progress Notes (Signed)
Pt arrived for ppd reading in R arm, no induration noted from placement from 03/12/21.

## 2021-03-22 ENCOUNTER — Ambulatory Visit: Payer: Medicare PPO

## 2021-03-22 DIAGNOSIS — E1159 Type 2 diabetes mellitus with other circulatory complications: Secondary | ICD-10-CM

## 2021-03-22 DIAGNOSIS — F0390 Unspecified dementia without behavioral disturbance: Secondary | ICD-10-CM

## 2021-03-22 NOTE — Patient Instructions (Signed)
Visit Information  Thank you for taking time to visit with me today. Please don't hesitate to contact me if I can be of assistance to you before our next scheduled telephone appointment.  Following are the goals we discussed today:  Contact LCSW directly (# [phone]) if you have questions or if additional social work needs arise in the near future.    If you are experiencing a Mental Health or Behavioral Health Crisis or need someone to talk to, please call the Suicide and Crisis Lifeline: 988   Patient verbalizes understanding of instructions and care plan provided today and agrees to view in MyChart. Active MyChart status confirmed with patient.    No further follow up required: Patient currently residing in long term care at [facility]  [address], LCSW [employer] [phone]

## 2021-03-22 NOTE — Chronic Care Management (AMB) (Signed)
Chronic Care Management    Clinical Social Work Note  03/22/2021 Name: AMZIE SILLAS MRN: [id number] DOB: 1933/02/11  EVETTA RENNER is a 88 y.o. year old female who is a primary care patient of Dale [place], MD. The CCM team was consulted to assist the patient with chronic disease management and/or care coordination needs related to: [employer] .   Collaboration with patient's spouse  for follow up visit in response to provider referral for social work chronic care management and care coordination services.   Consent to Services:  The patient was given information about Chronic Care Management services, agreed to services, and gave verbal consent prior to initiation of services.  Please see initial visit note for detailed documentation.   Patient agreed to services and consent obtained.   Assessment: Review of patient past medical history, allergies, medications, and health status, including review of relevant consultants reports was performed today as part of a comprehensive evaluation and provision of chronic care management and care coordination services.     SDOH (Social Determinants of Health) assessments and interventions performed:    Advanced Directives Status: Not addressed in this encounter.  CCM Care Plan  Allergies  Allergen Reactions   Atorvastatin Other (See Comments)    Muscle Pain   Sulfa Antibiotics     Outpatient Encounter Medications as of 03/22/2021  Medication Sig   aspirin 81 MG chewable tablet Chew 1 tablet (81 mg total) by mouth daily.   donepezil (ARICEPT) 10 MG tablet TAKE ONE TABLET AT BEDTIME.   levothyroxine (SYNTHROID) 75 MCG tablet TAKE ONE-HALF TABLET EVERY DAY   lisinopril (ZESTRIL) 20 MG tablet TAKE (1) TABLET BY MOUTH EVERY DAY   memantine (NAMENDA) 5 MG tablet Take 2 tablets by mouth at bedtime.   metoprolol tartrate (LOPRESSOR) 25 MG tablet TAKE (1) TABLET BY MOUTH TWICE DAILY   Syringe/Needle, Disp, (SYRINGE 3CC/25GX1") 25G X 1" 3  ML MISC Use as directed to administer b12 injections.   No facility-administered encounter medications on file as of 03/22/2021.    Patient Active Problem List   Diagnosis Date Noted   Aortic atherosclerosis (HCC) 08/03/2020   Renal cyst 02/01/2019   Anemia 02/01/2019   Acute ST elevation myocardial infarction (STEMI) of inferior wall [facility])    STEMI involving right coronary artery (HCC) 01/21/2019   Unstable angina pectoris (HCC) 01/19/2019   Abdominal bruit 09/14/2017   Muscle ache 03/06/2017   Fall 12/31/2014   Health care maintenance 05/20/2014   Diabetes mellitus with circulatory complication (HCC) 05/17/2013   CAD (coronary artery disease) 05/15/2012   Osteopenia 12/14/2011   Hypertension, essential 11/13/2011   Hypercholesteremia 11/13/2011   Dementia without behavioral disturbance (HCC) 11/13/2011   Hypothyroidism 11/13/2011    Conditions to be addressed/monitored:  Dementia.  Limited Social Support, Level of Care Concerns, Limited Access to Caregiver, Cognitive Deficits, Memory Deficits, Limited Ability to Perform ADL's/IADL's and Caregiver Stress. Care Plan : LCSW Plan of Care  Updates made by Wenda Overland, LCSW since 03/22/2021 12:00 AM     Problem: Maintain My Quality of Life through [facility].   Priority: High     Goal: Maintain My Quality of Life through [facility].   Start Date: 06/11/2020  This Visit's Progress: On track  Recent Progress: On track  Priority: High  Note:   Current Barriers:   Patient with Dementia without Behavioral Disturbance, Diabetes Mellitus with Circulatory Complication, History of Falls/Fall Risk, Osteopenia and Hypertension needs support, education, and care  coordination to resolve unmet personal care needs in the home. Patient is unable to self-administer medications as prescribed. Patient is unable to consistently perform ADL's/IADL's independently. Level of care concerns. Lacks knowledge of available  community agencies and resources. Clinical Goals:  Over the next 30 days, patient will have  face to face assessment completed through [name]-assessment scheduled 03/21/21 Patient will attend all scheduled medical appointments as evidenced by patient report and care team review of appointment completion in electronic medical record. Patient will demonstrate improved health management independence as evidenced by having in-home care services in place. Clinical Interventions: Follow up  phone call to patient's spouse who confirms that patient's condition continues to progress and increased care is now indicated. Patient's spouse confirms their grandson resides with them and is able to assist with patient's care in the evenings, however patient's condition is becoming difficult to manage, specifically in the evenings Per patient's spouse, Alarm system installed to increase safety and manage patient's tendency to wander Spouse confirms that he is paying out of pocket for in home care([facility]) at this time to assist with patient's care daily from 2-6pm, however has an appointment scheduled with patient's long term care policy agency([employer]) on 03/21/21 to complete assessment . Patient's granddaughter will be available for appointment and is planning a visit next week to organize documents received from [facility] and to assist with completing any additional documentation. Provided education to patient's spouse, Reyann Troop regarding level of care options-Patient's spouse confirms that he would like to keep patient in the home and has declined out of home care options at this time. He is hopeful that following the face to face assessment with [employer] that in home care will begin to be covered by them. Caregiver stress acknowledged, emotional support provided Discussed plans with patient and husband, Joselyne Spake for ongoing care management follow-up  and provided direct contact information for care management team. 03/22/21 Update Phone call to patient's spouse to follow up on patient's care needs. Patient's spouse confirmed that patient has now been transitioned to [facility] been there for the last two weeks-per patient's spouse, she continues to adjust to the placement, he is not able to visit at this time to give her time to get acclimated to her new surroundings. Patient's family currently working on long term care insurance coverage to pay for her stay([employer]) Emotional support  provided to patient's spouse as he makes efforts to adjust to patient's placement at [facility]  Patient Goals/Self-Care Activities:  Contact LCSW directly (# [phone]) if you have questions or if additional social work needs arise in the near future.        Follow Up Plan:  Client's spouse will contact this social worker with any additional community resource needs.      [address], LCSW [employer] [phone]

## 2021-03-26 DIAGNOSIS — F0390 Unspecified dementia without behavioral disturbance: Secondary | ICD-10-CM

## 2021-03-26 DIAGNOSIS — E119 Type 2 diabetes mellitus without complications: Secondary | ICD-10-CM | POA: Diagnosis not present

## 2021-03-26 DIAGNOSIS — I10 Essential (primary) hypertension: Secondary | ICD-10-CM | POA: Diagnosis not present

## 2021-03-30 ENCOUNTER — Telehealth: Payer: Self-pay | Admitting: Internal Medicine

## 2021-03-30 NOTE — Telephone Encounter (Signed)
See me before calling.  Please call Amanda [name].  We had completed FL2.  Was not sure if they were considering placement.  Also, do they feel she needs medication - to calm her down, etc.  ?

## 2021-04-02 NOTE — Telephone Encounter (Signed)
Patient is currently placed at [facility] according to 2/24 CCM note. ?

## 2021-05-21 ENCOUNTER — Encounter: Payer: Self-pay | Admitting: Internal Medicine

## 2021-05-21 ENCOUNTER — Ambulatory Visit: Payer: Medicare PPO | Admitting: Internal Medicine

## 2021-05-21 VITALS — BP 120/58 | HR 62 | Temp 97.4°F | Resp 14 | Ht 63.0 in | Wt 148.0 lb

## 2021-05-21 DIAGNOSIS — E1159 Type 2 diabetes mellitus with other circulatory complications: Secondary | ICD-10-CM

## 2021-05-21 DIAGNOSIS — D649 Anemia, unspecified: Secondary | ICD-10-CM | POA: Diagnosis not present

## 2021-05-21 DIAGNOSIS — I2510 Atherosclerotic heart disease of native coronary artery without angina pectoris: Secondary | ICD-10-CM

## 2021-05-21 DIAGNOSIS — I10 Essential (primary) hypertension: Secondary | ICD-10-CM | POA: Diagnosis not present

## 2021-05-21 DIAGNOSIS — I700 Atherosclerosis of aorta: Secondary | ICD-10-CM

## 2021-05-21 DIAGNOSIS — E7800 Pure hypercholesterolemia, unspecified: Secondary | ICD-10-CM | POA: Diagnosis not present

## 2021-05-21 DIAGNOSIS — F0390 Unspecified dementia without behavioral disturbance: Secondary | ICD-10-CM | POA: Diagnosis not present

## 2021-05-21 DIAGNOSIS — E039 Hypothyroidism, unspecified: Secondary | ICD-10-CM | POA: Diagnosis not present

## 2021-05-21 NOTE — Progress Notes (Signed)
Patient ID: Amanda [name], female   DOB: 1933-12-11, 88 y.o.   MRN: [id number] ? ? ?Subjective:  ? ? Patient ID: Amanda [name], female    DOB: [birth year], 88 y.o.   MRN: [id number] ? ?This visit occurred during the SARS-CoV-2 public health emergency.  Safety protocols were in place, including screening questions prior to the visit, additional usage of staff PPE, and extensive cleaning of exam room while observing appropriate contact time as indicated for disinfecting solutions.  ? ?Patient here for a scheduled follow up.  ? ?Chief Complaint  ?Patient presents with  ? Follow-up  ?  3 mo f/u  ? .  ? ?HPI ?She is accompanied by her husband.  History obtained from both of them.  She is now living at [facility].  This is going well.  She is eating regular meals.  Has friends.  Seems happy.  Husband is pleased.  She denies any chest pain or sob.  No nausea or vomiting.  No abdominal pain. Bowels moving.   ? ? ?Past Medical History:  ?Diagnosis Date  ? Carotid bruit   ? bilaterally  ? Dementia (HCC)   ? Hypercholesterolemia   ? Hypertension   ? Hypothyroidism   ? ?Past Surgical History:  ?Procedure Laterality Date  ? CARPAL TUNNEL RELEASE    ? CHOLECYSTECTOMY  1991  ? CORONARY/GRAFT ACUTE MI REVASCULARIZATION N/A 01/21/2019  ? Procedure: Coronary/Graft Acute MI Revascularization;  Surgeon: Alwyn Pea, MD;  Location: [facility] INVASIVE CV LAB;  Service: Cardiovascular;  Laterality: N/A;  ? LEFT HEART CATH AND CORONARY ANGIOGRAPHY N/A 01/21/2019  ? Procedure: LEFT HEART CATH AND CORONARY ANGIOGRAPHY;  Surgeon: Alwyn Pea, MD;  Location: [facility] INVASIVE CV LAB;  Service: Cardiovascular;  Laterality: N/A;  ? THORACIC AORTA STENT  12/13  ? heart stent  ? ?Family History  ?Problem Relation Age of Onset  ? Hypertension Father   ? Lung cancer Father   ? Hypertension Mother   ? Dementia Mother   ? Arthritis Other   ? Breast cancer Cousin   ?     mat cousin  ? Colon cancer Neg Hx   ? ?Social History  ? ?Socioeconomic History   ? Marital status: Married  ?  Spouse name: Belenda Alviar  ? Number of children: 2  ? Years of education: 58  ? Highest education level: Bachelor's degree (e.g., BA, AB, BS)  ?Occupational History  ? Occupation: Retired Engineer, site  ?Tobacco Use  ? Smoking status: Never  ? Smokeless tobacco: Never  ?Vaping Use  ? Vaping Use: Never used  ?Substance and Sexual Activity  ? Alcohol use: No  ?  Alcohol/week: 0.0 standard drinks  ? Drug use: No  ? Sexual activity: Not Currently  ?Other Topics Concern  ? Not on file  ?Social History Narrative  ? She is an only child. 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  ? ?Social Determinants of Health  ? ?Financial Resource Strain: Low Risk   ? Difficulty of Paying Living Expenses: Not hard at all  ?Food Insecurity: No Food Insecurity  ? Worried About Programme researcher, broadcasting/film/video in the Last Year: Never true  ? Ran Out of Food in the Last Year: Never true  ?Transportation Needs: No Transportation Needs  ? Lack of Transportation (Medical): No  ? Lack of Transportation (Non-Medical): No  ?Physical Activity: Inactive  ? Days of Exercise per Week: 0 days  ? Minutes  of Exercise per Session: 0 min  ?Stress: No Stress Concern Present  ? Feeling of Stress : Not at all  ?Social Connections: Moderately Isolated  ? Frequency of Communication with Friends and Family: More than three times a week  ? Frequency of Social Gatherings with Friends and Family: More than three times a week  ? Attends Religious Services: Never  ? Active Member of Clubs or Organizations: No  ? Attends Banker Meetings: Never  ? Marital Status: Married  ? ? ? ?Review of Systems  ?Constitutional:  Negative for appetite change and unexpected weight change.  ?HENT:  Negative for congestion and sinus pressure.   ?Respiratory:  Negative for cough, chest tightness and shortness of breath.   ?Cardiovascular:  Negative for chest pain, palpitations and leg swelling.  ?Gastrointestinal:  Negative  for abdominal pain, diarrhea, nausea and vomiting.  ?Genitourinary:  Negative for difficulty urinating and dysuria.  ?Musculoskeletal:  Negative for joint swelling and myalgias.  ?Skin:  Negative for color change and rash.  ?Neurological:  Negative for dizziness, light-headedness and headaches.  ?Psychiatric/Behavioral:  Negative for agitation and dysphoric mood.   ? ?   ?Objective:  ?  ? ?BP (!) 120/58 (BP Location: Left Arm, Patient Position: Sitting, Cuff Size: Small)   Pulse 62   Temp (!) 97.4 ?F (36.3 ?C) (Temporal)   Resp 14   Ht 5\' 3"  (1.6 m)   Wt 148 lb (67.1 kg)   SpO2 95%   BMI 26.22 kg/m?  ?Wt Readings from Last 3 Encounters:  ?05/21/21 148 lb (67.1 kg)  ?02/19/21 157 lb (71.2 kg)  ?11/08/20 163 lb (73.9 kg)  ? ? ?Physical Exam ?Vitals reviewed.  ?Constitutional:   ?   General: She is not in acute distress. ?   Appearance: Normal appearance.  ?HENT:  ?   Head: Normocephalic and atraumatic.  ?   Right Ear: External ear normal.  ?   Left Ear: External ear normal.  ?Eyes:  ?   General: No scleral icterus.    ?   Right eye: No discharge.     ?   Left eye: No discharge.  ?   Conjunctiva/sclera: Conjunctivae normal.  ?Neck:  ?   Thyroid: No thyromegaly.  ?Cardiovascular:  ?   Rate and Rhythm: Normal rate and regular rhythm.  ?Pulmonary:  ?   Effort: No respiratory distress.  ?   Breath sounds: Normal breath sounds. No wheezing.  ?Abdominal:  ?   General: Bowel sounds are normal.  ?   Palpations: Abdomen is soft.  ?   Tenderness: There is no abdominal tenderness.  ?Musculoskeletal:     ?   General: No swelling or tenderness.  ?   Cervical back: Neck supple. No tenderness.  ?Lymphadenopathy:  ?   Cervical: No cervical adenopathy.  ?Skin: ?   Findings: No erythema or rash.  ?Neurological:  ?   Mental Status: She is alert.  ?Psychiatric:     ?   Mood and Affect: Mood normal.     ?   Behavior: Behavior normal.  ? ? ? ?Outpatient Encounter Medications as of 05/21/2021  ?Medication Sig  ? aspirin 81 MG chewable  tablet Chew 1 tablet (81 mg total) by mouth daily.  ? donepezil (ARICEPT) 10 MG tablet TAKE ONE TABLET AT BEDTIME.  ? levothyroxine (SYNTHROID) 75 MCG tablet TAKE ONE-HALF TABLET EVERY DAY  ? lisinopril (ZESTRIL) 20 MG tablet TAKE (1) TABLET BY MOUTH EVERY DAY  ? memantine (NAMENDA)  5 MG tablet Take 2 tablets by mouth at bedtime.  ? metoprolol tartrate (LOPRESSOR) 25 MG tablet TAKE (1) TABLET BY MOUTH TWICE DAILY  ? Syringe/Needle, Disp, (SYRINGE 3CC/25GX1") 25G X 1" 3 ML MISC Use as directed to administer b12 injections.  ? ?No facility-administered encounter medications on file as of 05/21/2021.  ?  ? ?Lab Results  ?Component Value Date  ? WBC 9.2 05/21/2021  ? HGB 13.2 05/21/2021  ? HCT 40.7 05/21/2021  ? PLT 310.0 05/21/2021  ? GLUCOSE 164 (H) 05/21/2021  ? CHOL 235 (H) 11/08/2020  ? TRIG 192.0 (H) 11/08/2020  ? HDL 39.00 (L) 11/08/2020  ? LDLDIRECT 164.0 10/12/2019  ? LDLCALC 158 (H) 11/08/2020  ? ALT 14 05/21/2021  ? AST 12 05/21/2021  ? NA 139 05/21/2021  ? K 4.2 05/21/2021  ? CL 101 05/21/2021  ? CREATININE 1.05 05/21/2021  ? BUN 22 05/21/2021  ? CO2 29 05/21/2021  ? TSH 2.74 11/08/2020  ? INR 1.0 01/21/2019  ? HGBA1C 6.6 (H) 05/21/2021  ? MICROALBUR 0.3 05/25/2020  ? ? ?MM 3D SCREEN BREAST BILATERAL ? ?Result Date: 12/19/2020 ?CLINICAL DATA:  Screening. EXAM: DIGITAL SCREENING BILATERAL MAMMOGRAM WITH TOMOSYNTHESIS AND CAD TECHNIQUE: Bilateral screening digital craniocaudal and mediolateral oblique mammograms were obtained. Bilateral screening digital breast tomosynthesis was performed. The images were evaluated with computer-aided detection. COMPARISON:  Previous exam(s). ACR Breast Density Category b: There are scattered areas of fibroglandular density. FINDINGS: There are no findings suspicious for malignancy. IMPRESSION: No mammographic evidence of malignancy. A result letter of this screening mammogram will be mailed directly to the patient. RECOMMENDATION: Screening mammogram in one year.  (Code:SM-B-01Y) BI-RADS CATEGORY  1: Negative. Electronically Signed   By: Sherian ReinWei-Chen  Lin M.D.   On: 12/19/2020 15:19 ? ? ?   ?Assessment & Plan:  ? ?Problem List Items Addressed This Visit   ? ? Anemia  ?  Follow cbc.

## 2021-05-22 LAB — CBC WITH DIFFERENTIAL/PLATELET
Basophils Absolute: 0.1 10*3/uL (ref 0.0–0.1)
Basophils Relative: 1.3 % (ref 0.0–3.0)
Eosinophils Absolute: 0.2 10*3/uL (ref 0.0–0.7)
Eosinophils Relative: 1.7 % (ref 0.0–5.0)
HCT: 40.7 % (ref 36.0–46.0)
Hemoglobin: 13.2 g/dL (ref 12.0–15.0)
Lymphocytes Relative: 16.7 % (ref 12.0–46.0)
Lymphs Abs: 1.5 10*3/uL (ref 0.7–4.0)
MCHC: 32.3 g/dL (ref 30.0–36.0)
MCV: 87.2 fl (ref 78.0–100.0)
Monocytes Absolute: 0.7 10*3/uL (ref 0.1–1.0)
Monocytes Relative: 7.8 % (ref 3.0–12.0)
Neutro Abs: 6.7 10*3/uL (ref 1.4–7.7)
Neutrophils Relative %: 72.5 % (ref 43.0–77.0)
Platelets: 310 10*3/uL (ref 150.0–400.0)
RBC: 4.66 Mil/uL (ref 3.87–5.11)
RDW: 14.7 % (ref 11.5–15.5)
WBC: 9.2 10*3/uL (ref 4.0–10.5)

## 2021-05-22 LAB — BASIC METABOLIC PANEL
BUN: 22 mg/dL (ref 6–23)
CO2: 29 mEq/L (ref 19–32)
Calcium: 9.2 mg/dL (ref 8.4–10.5)
Chloride: 101 mEq/L (ref 96–112)
Creatinine, Ser: 1.05 mg/dL (ref 0.40–1.20)
GFR: 48.27 mL/min — ABNORMAL LOW (ref >60.00)
Glucose, Bld: 164 mg/dL — ABNORMAL HIGH (ref 70–99)
Potassium: 4.2 mEq/L (ref 3.5–5.1)
Sodium: 139 mEq/L (ref 135–145)

## 2021-05-22 LAB — HEPATIC FUNCTION PANEL
ALT: 14 U/L (ref 0–35)
AST: 12 U/L (ref 0–37)
Albumin: 3.8 g/dL (ref 3.5–5.2)
Alkaline Phosphatase: 94 U/L (ref 39–117)
Bilirubin, Direct: 0.1 mg/dL (ref 0.0–0.3)
Total Bilirubin: 0.3 mg/dL (ref 0.2–1.2)
Total Protein: 6.4 g/dL (ref 6.0–8.3)

## 2021-05-22 LAB — HEMOGLOBIN A1C: Hgb A1c MFr Bld: 6.6 % — ABNORMAL HIGH (ref 4.6–6.5)

## 2021-06-01 ENCOUNTER — Encounter: Payer: Self-pay | Admitting: Internal Medicine

## 2021-06-01 NOTE — Assessment & Plan Note (Signed)
On aricept and namenda.  Living at [facility] now.  Follow.  ?

## 2021-06-01 NOTE — Assessment & Plan Note (Signed)
Had intolerance to statin medication.  Continue current blood pressure medication.   

## 2021-06-01 NOTE — Assessment & Plan Note (Signed)
On thyroid replacement.  Follow tsh.  

## 2021-06-01 NOTE — Assessment & Plan Note (Signed)
On no medication.  Low carb diet and exercise.  Follow met b and a1c. Has lost weight.  Eating better.  Not eating as many sweets.  Follow.   

## 2021-06-01 NOTE — Assessment & Plan Note (Signed)
Continue lisinopril and metoprolol.  Blood pressure doing well.  Follow pressures.  Follow metabolic panel.  ?

## 2021-06-01 NOTE — Assessment & Plan Note (Signed)
Follow cbc.  

## 2021-06-01 NOTE — Assessment & Plan Note (Signed)
Known CAD.  S/p DES - RCA.  Continue plavix.  Sees cardiology.  Intolerant to statin medication.  Stable.  Follow.   

## 2021-06-01 NOTE — Assessment & Plan Note (Signed)
Intolerant to statin medication.  Not taking zetia.  Low cholesterol diet and exercise.  Follow lipid panel.  

## 2021-06-17 ENCOUNTER — Emergency Department: Payer: Medicare PPO

## 2021-06-17 ENCOUNTER — Inpatient Hospital Stay
Admission: EM | Admit: 2021-06-17 | Discharge: 2021-06-20 | DRG: 602 | Disposition: A | Discharge status: Home / Self Care | Payer: Medicare PPO | Source: Skilled Nursing Facility | Attending: Osteopathic Medicine | Admitting: Osteopathic Medicine

## 2021-06-17 ENCOUNTER — Other Ambulatory Visit: Payer: Self-pay

## 2021-06-17 DIAGNOSIS — E1159 Type 2 diabetes mellitus with other circulatory complications: Secondary | ICD-10-CM | POA: Diagnosis present

## 2021-06-17 DIAGNOSIS — F05 Delirium due to known physiological condition: Secondary | ICD-10-CM | POA: Diagnosis present

## 2021-06-17 DIAGNOSIS — S62663A Nondisplaced fracture of distal phalanx of left middle finger, initial encounter for closed fracture: Secondary | ICD-10-CM

## 2021-06-17 DIAGNOSIS — S6992XA Unspecified injury of left wrist, hand and finger(s), initial encounter: Secondary | ICD-10-CM | POA: Diagnosis not present

## 2021-06-17 DIAGNOSIS — I2510 Atherosclerotic heart disease of native coronary artery without angina pectoris: Secondary | ICD-10-CM | POA: Diagnosis present

## 2021-06-17 DIAGNOSIS — I10 Essential (primary) hypertension: Secondary | ICD-10-CM | POA: Diagnosis present

## 2021-06-17 DIAGNOSIS — Z79899 Other long term (current) drug therapy: Secondary | ICD-10-CM | POA: Diagnosis not present

## 2021-06-17 DIAGNOSIS — Z881 Allergy status to other antibiotic agents status: Secondary | ICD-10-CM | POA: Diagnosis not present

## 2021-06-17 DIAGNOSIS — S62633A Displaced fracture of distal phalanx of left middle finger, initial encounter for closed fracture: Secondary | ICD-10-CM | POA: Diagnosis not present

## 2021-06-17 DIAGNOSIS — I252 Old myocardial infarction: Secondary | ICD-10-CM | POA: Diagnosis not present

## 2021-06-17 DIAGNOSIS — E1122 Type 2 diabetes mellitus with diabetic chronic kidney disease: Secondary | ICD-10-CM | POA: Diagnosis present

## 2021-06-17 DIAGNOSIS — Z79890 Hormone replacement therapy (postmenopausal): Secondary | ICD-10-CM | POA: Diagnosis not present

## 2021-06-17 DIAGNOSIS — L089 Local infection of the skin and subcutaneous tissue, unspecified: Secondary | ICD-10-CM | POA: Diagnosis present

## 2021-06-17 DIAGNOSIS — X58XXXA Exposure to other specified factors, initial encounter: Secondary | ICD-10-CM | POA: Diagnosis present

## 2021-06-17 DIAGNOSIS — Z8249 Family history of ischemic heart disease and other diseases of the circulatory system: Secondary | ICD-10-CM

## 2021-06-17 DIAGNOSIS — L03012 Cellulitis of left finger: Secondary | ICD-10-CM | POA: Diagnosis present

## 2021-06-17 DIAGNOSIS — E039 Hypothyroidism, unspecified: Secondary | ICD-10-CM | POA: Diagnosis present

## 2021-06-17 DIAGNOSIS — I129 Hypertensive chronic kidney disease with stage 1 through stage 4 chronic kidney disease, or unspecified chronic kidney disease: Secondary | ICD-10-CM | POA: Diagnosis present

## 2021-06-17 DIAGNOSIS — N1831 Chronic kidney disease, stage 3a: Secondary | ICD-10-CM | POA: Diagnosis present

## 2021-06-17 DIAGNOSIS — U071 COVID-19: Secondary | ICD-10-CM | POA: Diagnosis present

## 2021-06-17 DIAGNOSIS — Z801 Family history of malignant neoplasm of trachea, bronchus and lung: Secondary | ICD-10-CM

## 2021-06-17 DIAGNOSIS — Z803 Family history of malignant neoplasm of breast: Secondary | ICD-10-CM | POA: Diagnosis not present

## 2021-06-17 DIAGNOSIS — Z9049 Acquired absence of other specified parts of digestive tract: Secondary | ICD-10-CM | POA: Diagnosis not present

## 2021-06-17 DIAGNOSIS — F03911 Unspecified dementia, unspecified severity, with agitation: Secondary | ICD-10-CM | POA: Diagnosis present

## 2021-06-17 DIAGNOSIS — Z955 Presence of coronary angioplasty implant and graft: Secondary | ICD-10-CM | POA: Diagnosis not present

## 2021-06-17 DIAGNOSIS — F03918 Unspecified dementia, unspecified severity, with other behavioral disturbance: Secondary | ICD-10-CM

## 2021-06-17 DIAGNOSIS — E7800 Pure hypercholesterolemia, unspecified: Secondary | ICD-10-CM | POA: Diagnosis present

## 2021-06-17 DIAGNOSIS — F0390 Unspecified dementia without behavioral disturbance: Secondary | ICD-10-CM | POA: Diagnosis not present

## 2021-06-17 DIAGNOSIS — Z888 Allergy status to other drugs, medicaments and biological substances status: Secondary | ICD-10-CM | POA: Diagnosis not present

## 2021-06-17 DIAGNOSIS — I959 Hypotension, unspecified: Secondary | ICD-10-CM | POA: Diagnosis not present

## 2021-06-17 DIAGNOSIS — M7989 Other specified soft tissue disorders: Secondary | ICD-10-CM | POA: Diagnosis not present

## 2021-06-17 LAB — CBC WITH DIFFERENTIAL/PLATELET
Abs Immature Granulocytes: 0.02 10*3/uL (ref 0.00–0.07)
Basophils Absolute: 0.1 10*3/uL (ref 0.0–0.1)
Basophils Relative: 1 %
Eosinophils Absolute: 0.1 10*3/uL (ref 0.0–0.5)
Eosinophils Relative: 1 %
HCT: 43 % (ref 36.0–46.0)
Hemoglobin: 13.3 g/dL (ref 12.0–15.0)
Immature Granulocytes: 0 %
Lymphocytes Relative: 18 %
Lymphs Abs: 1.6 10*3/uL (ref 0.7–4.0)
MCH: 27 pg (ref 26.0–34.0)
MCHC: 30.9 g/dL (ref 30.0–36.0)
MCV: 87.2 fL (ref 80.0–100.0)
Monocytes Absolute: 0.8 10*3/uL (ref 0.1–1.0)
Monocytes Relative: 9 %
Neutro Abs: 6.2 10*3/uL (ref 1.7–7.7)
Neutrophils Relative %: 71 %
Platelets: 287 10*3/uL (ref 150–400)
RBC: 4.93 MIL/uL (ref 3.87–5.11)
RDW: 13.5 % (ref 11.5–15.5)
WBC: 8.8 10*3/uL (ref 4.0–10.5)
nRBC: 0 % (ref 0.0–0.2)

## 2021-06-17 LAB — BASIC METABOLIC PANEL
Anion gap: 9 (ref 5–15)
BUN: 18 mg/dL (ref 8–23)
CO2: 27 mmol/L (ref 22–32)
Calcium: 9.3 mg/dL (ref 8.9–10.3)
Chloride: 103 mmol/L (ref 98–111)
Creatinine, Ser: 1 mg/dL (ref 0.44–1.00)
GFR, Estimated: 55 mL/min — ABNORMAL LOW (ref >60)
Glucose, Bld: 101 mg/dL — ABNORMAL HIGH (ref 70–99)
Potassium: 4 mmol/L (ref 3.5–5.1)
Sodium: 139 mmol/L (ref 135–145)

## 2021-06-17 LAB — SEDIMENTATION RATE: Sed Rate: 29 mm/hr (ref 0–30)

## 2021-06-17 LAB — RESP PANEL BY RT-PCR (FLU A&B, COVID) ARPGX2
Influenza A by PCR: NEGATIVE
Influenza B by PCR: NEGATIVE
SARS Coronavirus 2 by RT PCR: POSITIVE — AB

## 2021-06-17 MED ORDER — LISINOPRIL 20 MG PO TABS
20.0000 mg | ORAL_TABLET | Freq: Every day | ORAL | Status: DC
  Administered 2021-06-17 – 2021-06-20 (×4): 20 mg via ORAL
  Filled 2021-06-17 (×3): qty 1
  Filled 2021-06-17: qty 2

## 2021-06-17 MED ORDER — ASPIRIN 81 MG PO CHEW
81.0000 mg | CHEWABLE_TABLET | Freq: Every day | ORAL | Status: DC
  Administered 2021-06-18 – 2021-06-20 (×3): 81 mg via ORAL
  Filled 2021-06-17 (×3): qty 1

## 2021-06-17 MED ORDER — LEVOTHYROXINE SODIUM 50 MCG PO TABS
75.0000 ug | ORAL_TABLET | Freq: Every day | ORAL | Status: DC
  Administered 2021-06-18 – 2021-06-20 (×3): 75 ug via ORAL
  Filled 2021-06-17 (×3): qty 1

## 2021-06-17 MED ORDER — SODIUM CHLORIDE 0.9 % IV SOLN
1.0000 g | Freq: Once | INTRAVENOUS | Status: AC
  Administered 2021-06-17: 1 g via INTRAVENOUS
  Filled 2021-06-17: qty 10

## 2021-06-17 MED ORDER — DM-GUAIFENESIN ER 30-600 MG PO TB12: 1.0000 | ORAL_TABLET | Freq: Two times a day (BID) | ORAL | Status: DC | PRN

## 2021-06-17 MED ORDER — LORAZEPAM 0.5 MG PO TABS
0.5000 mg | ORAL_TABLET | Freq: Two times a day (BID) | ORAL | Status: DC
  Administered 2021-06-17 – 2021-06-20 (×6): 0.5 mg via ORAL
  Filled 2021-06-17 (×6): qty 1

## 2021-06-17 MED ORDER — ACETAMINOPHEN 325 MG PO TABS: 650.0000 mg | ORAL_TABLET | Freq: Four times a day (QID) | ORAL | Status: DC | PRN

## 2021-06-17 MED ORDER — LEVOTHYROXINE SODIUM 25 MCG PO TABS: 37.5000 ug | ORAL_TABLET | Freq: Every day | ORAL | Status: DC

## 2021-06-17 MED ORDER — HYDRALAZINE HCL 20 MG/ML IJ SOLN: 5.0000 mg | INTRAMUSCULAR | Status: DC | PRN

## 2021-06-17 MED ORDER — DONEPEZIL HCL 5 MG PO TABS
10.0000 mg | ORAL_TABLET | Freq: Every day | ORAL | Status: DC
  Administered 2021-06-17 – 2021-06-19 (×3): 10 mg via ORAL
  Filled 2021-06-17 (×3): qty 2

## 2021-06-17 MED ORDER — VANCOMYCIN HCL 1250 MG/250ML IV SOLN
1250.0000 mg | Freq: Once | INTRAVENOUS | Status: AC
  Administered 2021-06-17: 1250 mg via INTRAVENOUS
  Filled 2021-06-17: qty 250

## 2021-06-17 MED ORDER — ONDANSETRON HCL 4 MG/2ML IJ SOLN
4.0000 mg | Freq: Three times a day (TID) | INTRAMUSCULAR | Status: DC | PRN
Start: 2021-06-17 — End: 2021-06-20

## 2021-06-17 MED ORDER — ALPRAZOLAM 0.5 MG PO TABS
0.5000 mg | ORAL_TABLET | Freq: Two times a day (BID) | ORAL | Status: DC | PRN
  Administered 2021-06-17 – 2021-06-19 (×2): 0.5 mg via ORAL
  Filled 2021-06-17 (×2): qty 1

## 2021-06-17 MED ORDER — ENOXAPARIN SODIUM 40 MG/0.4ML IJ SOSY
40.0000 mg | PREFILLED_SYRINGE | INTRAMUSCULAR | Status: DC
  Administered 2021-06-17 – 2021-06-19 (×3): 40 mg via SUBCUTANEOUS
  Filled 2021-06-17 (×3): qty 0.4

## 2021-06-17 MED ORDER — OXYCODONE-ACETAMINOPHEN 5-325 MG PO TABS
1.0000 | ORAL_TABLET | ORAL | Status: DC | PRN
  Administered 2021-06-19 (×2): 1 via ORAL
  Filled 2021-06-17 (×2): qty 1

## 2021-06-17 MED ORDER — ALBUTEROL SULFATE (2.5 MG/3ML) 0.083% IN NEBU: 2.5000 mg | INHALATION_SOLUTION | RESPIRATORY_TRACT | Status: DC | PRN

## 2021-06-17 MED ORDER — MEMANTINE HCL 5 MG PO TABS
10.0000 mg | ORAL_TABLET | Freq: Every day | ORAL | Status: DC
  Administered 2021-06-17 – 2021-06-19 (×3): 10 mg via ORAL
  Filled 2021-06-17 (×3): qty 2

## 2021-06-17 MED ORDER — SODIUM CHLORIDE 0.9 % IV SOLN
1.0000 g | INTRAVENOUS | Status: DC
  Administered 2021-06-18: 1 g via INTRAVENOUS
  Filled 2021-06-17 (×2): qty 10

## 2021-06-17 MED ORDER — HEPARIN SODIUM (PORCINE) 5000 UNIT/ML IJ SOLN
5000.0000 [IU] | Freq: Three times a day (TID) | INTRAMUSCULAR | Status: DC
  Administered 2021-06-17: 5000 [IU] via SUBCUTANEOUS
  Filled 2021-06-17: qty 1

## 2021-06-17 MED ORDER — METOPROLOL TARTRATE 25 MG PO TABS
25.0000 mg | ORAL_TABLET | Freq: Two times a day (BID) | ORAL | Status: DC
  Administered 2021-06-17 – 2021-06-20 (×6): 25 mg via ORAL
  Filled 2021-06-17 (×6): qty 1

## 2021-06-17 NOTE — ED Provider Notes (Signed)
[facility] Provider Note    Event Date/Time   First MD Initiated Contact with Patient 06/17/21 1320     (approximate)   History   Chief Complaint Finger Injury   HPI Amanda [name] is a 88 y.o. female, history of hypertension, hypothyroidism, dementia, hypercholesterolemia, presents to the emergency department from an [facility] via EMS for evaluation of left middle finger injury.  Patient states that she injured her finger a few days ago, but is unsure how exactly it happened.  She states over the past few days is gotten more red and swollen.  Denies fever/chills, numbness/tingling, cold sensation, chest pain, shortness of breath, abdominal pain, headache, neck pain, or dizziness/lightheadedness.  History Limitations: Dementia.  No collateral information at this time from [facility] or family.        Physical Exam  Triage Vital Signs: ED Triage Vitals  Enc Vitals Group     BP 06/17/21 1300 (!) 153/52     Pulse Rate 06/17/21 1300 78     Resp 06/17/21 1300 19     Temp 06/17/21 1300 (!) 97.5 F (36.4 C)     Temp src --      SpO2 06/17/21 1300 96 %     Weight 06/17/21 1307 147 lb 14.9 oz (67.1 kg)     Height 06/17/21 1307 5\' 3"  (1.6 m)     Head Circumference --      Peak Flow --      Pain Score 06/17/21 1207 5     Pain Loc --      Pain Edu? --      Excl. in GC? --     Most recent vital signs: Vitals:   06/17/21 1605 06/17/21 1641  BP:  (!) 182/75  Pulse:  79  Resp: 18 18  Temp:    SpO2: 96% 97%    General: Awake, NAD.  Skin: Warm, dry. No rashes or lesions.  Eyes: PERRL. Conjunctivae normal.  CV: Good peripheral perfusion.  Resp: Normal effort.  Abd: Soft, non-tender. No distention.  Neuro: At baseline. No gross neurological deficits.   Focused Exam: Significant erythema and swelling along the left middle finger.  Mild tenderness along the flexor sheath which terminates around the PIP.  Limited flexion extension at the PIP and  DIP joints due to swelling and pain.  Erythema extends along the dorsum of the hand and into the wrist.  Pulse, motor, sensation intact distally.  Hemorrhagic blister present along the medial aspect of the nail fold.  Sensation intact.  Normal cap refill.  Physical Exam    ED Results / Procedures / Treatments  Labs (all labs ordered are listed, but only abnormal results are displayed) Labs Reviewed  RESP PANEL BY RT-PCR (FLU A&B, COVID) ARPGX2 - Abnormal; Notable for the following components:      Result Value   SARS Coronavirus 2 by RT PCR POSITIVE (*)    All other components within normal limits  BASIC METABOLIC PANEL - Abnormal; Notable for the following components:   Glucose, Bld 101 (*)    GFR, Estimated 55 (*)    All other components within normal limits  CULTURE, BLOOD (ROUTINE X 2)  CULTURE, BLOOD (ROUTINE X 2)  CBC WITH DIFFERENTIAL/PLATELET  SEDIMENTATION RATE  C-REACTIVE PROTEIN  CBC     EKG NA   RADIOLOGY  ED Provider Interpretation: I personally reviewed this x-ray, evidence suggestive of tiny avulsion fracture of the third distal phalanx.  DG  Hand Complete Left  Result Date: 06/17/2021 CLINICAL DATA:  Middle finger injury. EXAM: LEFT HAND - COMPLETE 3+ VIEW COMPARISON:  None Available. FINDINGS: Cortical irregularity at the volar base of the third distal phalanx. No dislocation. Mild oste[facility] of the [facility] and first IP joint. Asymmetric soft tissue swelling of the distal long finger. No radiopaque foreign body identified. IMPRESSION: 1. Asymmetric soft tissue swelling of the distal long finger. Cortical irregularity at the volar base of the third distal phalanx may reflect a tiny avulsion fracture. Electronically Signed   By: Obie DredgeWilliam T Derry M.D.   On: 06/17/2021 13:41    PROCEDURES:  Critical Care performed: N/A.  Procedures    MEDICATIONS ORDERED IN ED: Medications  oxyCODONE-acetaminophen (PERCOCET/ROXICET) 5-325 MG per tablet 1 tablet  (has no administration in time range)  acetaminophen (TYLENOL) tablet 650 mg (has no administration in time range)  hydrALAZINE (APRESOLINE) injection 5 mg (has no administration in time range)  ondansetron (ZOFRAN) injection 4 mg (has no administration in time range)  albuterol (PROVENTIL) (2.5 MG/3ML) 0.083% nebulizer solution 2.5 mg (has no administration in time range)  dextromethorphan-guaiFENesin (MUCINEX DM) 30-600 MG per 12 hr tablet 1 tablet (has no administration in time range)  aspirin chewable tablet 81 mg (has no administration in time range)  lisinopril (ZESTRIL) tablet 20 mg (has no administration in time range)  metoprolol tartrate (LOPRESSOR) tablet 25 mg (has no administration in time range)  ALPRAZolam (XANAX) tablet 0.5 mg (has no administration in time range)  donepezil (ARICEPT) tablet 10 mg (has no administration in time range)  LORazepam (ATIVAN) tablet 0.5 mg (has no administration in time range)  memantine (NAMENDA) tablet 10 mg (has no administration in time range)  levothyroxine (SYNTHROID) tablet 75 mcg (has no administration in time range)  enoxaparin (LOVENOX) injection 40 mg (has no administration in time range)  cefTRIAXone (ROCEPHIN) 1 g in sodium chloride 0.9 % 100 mL IVPB (has no administration in time range)  cefTRIAXone (ROCEPHIN) 1 g in sodium chloride 0.9 % 100 mL IVPB (0 g Intravenous Stopped 06/17/21 1637)  vancomycin (VANCOREADY) IVPB 1250 mg/250 mL (1,250 mg Intravenous New Bag/Given 06/17/21 1637)     IMPRESSION / MDM / ASSESSMENT AND PLAN / ED COURSE  I reviewed the triage vital signs and the nursing notes.                              Differential diagnosis includes, but is not limited to, phalangeal fracture, cellulitis, flexor tenosynovitis, paronychia, mallet finger, [place] finger.  ED Course Patient appears well, NAD.  CBC shows no leukocytosis or anemia.  BMP shows no evidence of kidney injury or electrolyte abnormalities.  Unremarkable  cementation rate.  Assessment/Plan Patient presents with left middle finger erythema and swelling in the setting of unknown mechanical injury.  X-ray shows evidence suggestive of tiny avulsion fracture with some asymmetric soft tissue swelling, but otherwise unremarkable.  Consulted the on-call orthopedic surgeon, Dr. Allena KatzPatel, to evaluate for flexor tenosynovitis.  He states that flexor tenosynovitis is unlikely given her presentation. This is supported by her lab work-up.  However, it does appear potentially cellulitic.  We will go ahead initiate IV antibiotics and admit for further evaluation and management.  We will additionally provide splint for suspected avulsion fracture/flexor tendon injury.  Spoke with the on-call hospitalist, Dr.Niu, Brien FewXilin, who accepted admission.  Attempted contact with husband, but was unable to reach.  Contact achieved with the  patient's granddaughter, [name] and the grandson Izzy Doubek, who stated that one of them will be there to visit.      FINAL CLINICAL IMPRESSION(S) / ED DIAGNOSES   Final diagnoses:  Injury of finger of left hand, initial encounter     Rx / DC Orders   ED Discharge Orders     None        Note:  This document was prepared using Dragon voice recognition software and may include unintentional dictation errors.   Varney Daily, [place] 06/17/21 Ninfa Linden    Chesley Noon, MD 06/18/21 [phone]

## 2021-06-17 NOTE — Assessment & Plan Note (Addendum)
Resume home lisinopril, metoprolol

## 2021-06-17 NOTE — ED Triage Notes (Addendum)
Pt comes via EMs from [facility] with c/o left middle finger injury. Pt is covid+ but no complaints with that today. VSS  Pt does have dementia.

## 2021-06-17 NOTE — Assessment & Plan Note (Signed)
Patient reportedly had positive COVID test at least 5 days ago in facility.  Currently pt is asymptomatic.  Oxygen saturation 96% on room air.  No antiviral treatment needed. -Supportive care -As needed albuterol and Mucinex if she develops symptoms.

## 2021-06-17 NOTE — Assessment & Plan Note (Signed)
No CP. -ASA

## 2021-06-17 NOTE — ED Notes (Signed)
Attempted to splint/ buddy tape patient's finger. Patient removed tape right after RN applied tape.

## 2021-06-17 NOTE — Progress Notes (Signed)
PHARMACY -  BRIEF ANTIBIOTIC NOTE   Pharmacy has received consult(s) for Vancomycin from an ED provider.  The patient's profile has been reviewed for ht/wt/allergies/indication/available labs.    One time order(s) placed for Vancomycin 1250mg   Further antibiotics/pharmacy consults should be ordered by admitting physician if indicated.                       Thank you, 06/17/2021  3:39 PM

## 2021-06-17 NOTE — Consult Note (Signed)
ORTHOPAEDIC CONSULTATION  REQUESTING PHYSICIAN: Lorretta Harp, MD  Chief Complaint:   Left middle finger pain and swelling  History of Present Illness: Amanda [name] is a 88 y.o. female with significant dementia Who presents to the emergency department with significant left middle finger pain and swelling.  There are reports that the patient may recall having injured her finger a few days ago, but patient is unable to describe what happened to me when I discussed this with her.  Past Medical History:  Diagnosis Date   Carotid bruit    bilaterally   Dementia (HCC)    Hypercholesterolemia    Hypertension    Hypothyroidism    Past Surgical History:  Procedure Laterality Date   CARPAL TUNNEL RELEASE     CHOLECYSTECTOMY  1991   CORONARY/GRAFT ACUTE MI REVASCULARIZATION N/A 01/21/2019   Procedure: Coronary/Graft Acute MI Revascularization;  Surgeon: Alwyn Pea, MD;  Location: [facility] INVASIVE CV LAB;  Service: Cardiovascular;  Laterality: N/A;   LEFT HEART CATH AND CORONARY ANGIOGRAPHY N/A 01/21/2019   Procedure: LEFT HEART CATH AND CORONARY ANGIOGRAPHY;  Surgeon: Alwyn Pea, MD;  Location: [facility] INVASIVE CV LAB;  Service: Cardiovascular;  Laterality: N/A;   THORACIC AORTA STENT  12/13   heart stent   Social History   Socioeconomic History   Marital status: Married    Spouse name: Andersyn Fragoso   Number of children: 2   Years of education: 16   Highest education level: Bachelor's degree (e.g., BA, AB, BS)  Occupational History   Occupation: Retired Engineer, site  Tobacco Use   Smoking status: Never   Smokeless tobacco: Never  Vaping Use   Vaping Use: Never used  Substance and Sexual Activity   Alcohol use: No    Alcohol/week: 0.0 standard drinks   Drug use: No   Sexual activity: Not Currently  Other Topics Concern   Not on file  Social History Narrative   She is an only child. 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   Social Determinants of Health   Financial Resource Strain: Not on file  Food Insecurity: Not on file  Transportation Needs: Not on file  Physical Activity: Not on file  Stress: Not on file  Social Connections: Not on file   Family History  Problem Relation Age of Onset   Hypertension Father    Lung cancer Father    Hypertension Mother    Dementia Mother    Arthritis Other    Breast cancer Cousin        mat cousin   Colon cancer Neg Hx    Allergies  Allergen Reactions   Atorvastatin Other (See Comments)    Muscle Pain   Sulfa Antibiotics    Prior to Admission medications   Medication Sig Start Date End Date Taking? Authorizing Provider  aspirin 81 MG chewable tablet Chew 1 tablet (81 mg total) by mouth daily. 01/25/19   Lynn Ito, MD  donepezil (ARICEPT) 10 MG tablet TAKE ONE TABLET AT BEDTIME. 04/30/20   Dale Nixon, MD  levothyroxine (SYNTHROID) 75 MCG tablet TAKE ONE-HALF TABLET EVERY DAY 04/16/20   Dale [place], MD  lisinopril (ZESTRIL) 20 MG tablet TAKE (1) TABLET BY MOUTH EVERY DAY 01/07/21   Dale [place], MD  memantine (NAMENDA) 5 MG tablet Take 2 tablets by mouth at bedtime. 09/26/20 09/26/21  [provider]  metoprolol tartrate (LOPRESSOR) 25 MG tablet TAKE (1) TABLET BY MOUTH TWICE DAILY 02/11/21  Dale [place], MD  Syringe/Needle, Disp, (SYRINGE 3CC/25GX1") 25G X 1" 3 ML MISC Use as directed to administer b12 injections. 06/07/19   Dale Santaquin, MD   Recent Labs    06/17/21 1448  WBC 8.8  HGB 13.3  HCT 43.0  PLT 287  K 4.0  CL 103  CO2 27  BUN 18  CREATININE 1.00  GLUCOSE 101*  CALCIUM 9.3   DG Hand Complete Left  Result Date: 06/17/2021 CLINICAL DATA:  Middle finger injury. EXAM: LEFT HAND - COMPLETE 3+ VIEW COMPARISON:  None Available. FINDINGS: Cortical irregularity at the volar base of the third distal phalanx. No dislocation. Mild osteoarthritis of the first [facility] joint and  first IP joint. Asymmetric soft tissue swelling of the distal long finger. No radiopaque foreign body identified. IMPRESSION: 1. Asymmetric soft tissue swelling of the distal long finger. Cortical irregularity at the volar base of the third distal phalanx may reflect a tiny avulsion fracture. Electronically Signed   By: Obie Dredge M.D.   On: 06/17/2021 13:41     Positive ROS: All other systems have been reviewed and were otherwise negative with the exception of those mentioned in the HPI and as above.  Physical Exam: BP (!) 150/60   Pulse 80   Temp (!) 97.5 F (36.4 C)   Resp 18   Ht 5\' 3"  (1.6 m)   Wt 67.1 kg   SpO2 96%   BMI 26.20 kg/m  General:  Alert, no acute distress Psychiatric:  Patient is competent for consent with normal mood and affect   Cardiovascular:  No pedal edema, regular rate and rhythm Respiratory:  No wheezing, non-labored breathing GI:  Abdomen is soft and non-tender Skin:  No lesions in the area of chief complaint, no erythema Neurologic:  Sensation intact distally, CN grossly intact Lymphatic:  No axillary or cervical lymphadenopathy  Orthopedic Exam:  LUE: +ain/pin/u motor SILT r/u/m/ax +rad pulse Able to flex and extend middle finger although with mild difficulty.  No significant pain with palpation over flexor tendon sheath proximal to DIP joint.  Significant swelling overlying the distal phalanx.  Significant tenderness, most prominent, about the ulnar aspect of the DIP joint with overlying bruising consistent with likely blood blister.  There is also some mild erythema and diffuse soft tissue swelling about the finger distal to the PIP joint   X-rays:  As above: There is a small avulsion fracture about the volar aspect of the distal phalanx at the DIP joint.  There is surrounding soft tissue swelling present.  Assessment/Plan: 88 year old with significant dementia who with left middle finger avulsion fracture of the volar aspect distal phalanx at  the DIP joint. 1.  Given the radiographic findings as well as her clinical exam, current symptoms appear to be more related to traumatic mechanism.  Patient unable to give full history, but per discussion with ED staff, patient recalled having injured her finger a few days ago.  2.  No plan for surgical intervention at this time.  No significant concern for flexor tenosynovitis at this time.  Given proximal streaking erythema, there is mild concern for superimposed cellulitis/soft tissue infection.  3.  Recommend nonweightbearing with the left hand.  Recommend placing in a splint.  4.  After discussion with ED staff, patient will likely be admitted to hospitalist service for antibiotics, at least overnight.  Patient can follow-up with [facility] clinic orthopedics for finger.  Please page with any questions.   [facility]   06/17/2021 4:37 PM

## 2021-06-17 NOTE — Assessment & Plan Note (Signed)
Diet-controlled diabetes.  Recent A1c 6.0.  Well-controlled.  Patient's not taking medications currently.  Blood sugar 101 -Check blood sugar every morning

## 2021-06-17 NOTE — ED Notes (Signed)
See triage note  presents with injury to left middle finger from [facility]  Pt is unsure of what happened to finger  finger is swollen  and bruised   afebrile on arrival

## 2021-06-17 NOTE — Assessment & Plan Note (Signed)
Pt has infection with cellulitis in the left middle finger.  X-ray showed possible tiny avulsion fracture.  Dr. Posey Pronto for Ortho is consulted. Per Dr. Posey Pronto, no significant concern for flexor tenosynovitis at this time. No plan for surgical intervention at this time.  he recommend nonweightbearing with the left hand and placing in a splint.  Patient does not have fever or leukocytosis.  Does not meet criteria for sepsis.  - Placed on MedSurg bed for observation - place splint - Empiric antimicrobial treatment with Rocephin (patient also received 1 dose of vancomycin in ED) - PRN Zofran for nausea, Tylenol and Percocet for pain - Blood cultures x 2  - ESR and CRP

## 2021-06-17 NOTE — Assessment & Plan Note (Signed)
Stable - no acute issues

## 2021-06-17 NOTE — Assessment & Plan Note (Signed)
continue home Namenda, donepezil. Minimize Xanax. Added seroquel qhs

## 2021-06-17 NOTE — ED Notes (Signed)
Pt's husband and son went home for the evening

## 2021-06-17 NOTE — ED Notes (Signed)
Secure message sent to Melynda Keller, RN regarding inpatient admission, room was "clean" now marked "dirty".

## 2021-06-17 NOTE — ED Notes (Signed)
Bethany RN aware of assigned bed 

## 2021-06-17 NOTE — Assessment & Plan Note (Signed)
Continue home Synthroid 75 mcg daily °

## 2021-06-17 NOTE — H&P (Signed)
History and Physical    Amanda [name] MRN:[id number] DOB: 01/09/1934 DOA: 06/17/2021  Referring MD/NP/PA:   PCP: Scott, Charlene, MD   Patient coming from:  The patient is coming from SNF    Chief Complaint: left middle finger injury  HPI: [name] is a 88 y.o. female with medical history significant of hypertension, hyperlipidemia, diet-controlled diabetes, hypothyroidism, dementia, bilateral carotid artery stenosis, CAD, STEMI, CKD-3A, who presents with left middle finger injury.  Patient has dementia, cannot provide accurate medical history. Per report and per his grandson at the bedside, patient possibly injured her left middle finger few days ago. She developed pain in left middle finger, with swelling, erythema and a blood blister.  No nausea vomiting, diarrhea or abdominal pain.  Patient was reportedly had a positive test at least 5 days ago in the nursing home, no active cough, shortness breath, chest pain.  No fever or chills.  Patient's mental status seems to be at baseline.  Data Reviewed and ED Course: pt was found to have WBC 8.8, stable renal function, temperature 97.5, blood pressure 150/60, heart rate 80, RR 19.  Patient is placed on MedSurg bed follow-up physician, Dr. Patel of Ortho is consulted.  X-ray of left hand: 1. Asymmetric soft tissue swelling of the distal long finger. Cortical irregularity at the volar base of the third distal phalanx may reflect a tiny avulsion fracture.  EKG: Not done in ED   Review of Systems: Could not be reviewed accurately due to dementia   Allergy:  Allergies  Allergen Reactions   Atorvastatin Other (See Comments)    Muscle Pain   Sulfa Antibiotics     Past Medical History:  Diagnosis Date   Carotid bruit    bilaterally   Dementia (HCC)    Hypercholesterolemia    Hypertension    Hypothyroidism     Past Surgical History:  Procedure Laterality Date   CARPAL TUNNEL RELEASE     CHOLECYSTECTOMY  1991    CORONARY/GRAFT ACUTE MI REVASCULARIZATION N/A 01/21/2019   Procedure: Coronary/Graft Acute MI Revascularization;  Surgeon: Callwood, Dwayne D, MD;  Location: [facility] INVASIVE CV LAB;  Service: Cardiovascular;  Laterality: N/A;   LEFT HEART CATH AND CORONARY ANGIOGRAPHY N/A 01/21/2019   Procedure: LEFT HEART CATH AND CORONARY ANGIOGRAPHY;  Surgeon: Callwood, Dwayne D, MD;  Location: [facility] INVASIVE CV LAB;  Service: Cardiovascular;  Laterality: N/A;   THORACIC AORTA STENT  12/13   heart stent    Social History:  reports that she has never smoked. She has never used smokeless tobacco. She reports that she does not drink alcohol and does not use drugs.  Family History:  Family History  Problem Relation Age of Onset   Hypertension Father    Lung cancer Father    Hypertension Mother    Dementia Mother    Arthritis Other    Breast cancer Cousin        mat cousin   Colon cancer Neg Hx      Prior to Admission medications   Medication Sig Start Date End Date Taking? Authorizing Provider  aspirin 81 MG chewable tablet Chew 1 tablet (81 mg total) by mouth daily. 01/25/19   Amery, Sahar, MD  donepezil (ARICEPT) 10 MG tablet TAKE ONE TABLET AT BEDTIME. 04/30/20   Scott, Charlene, MD  levothyroxine (SYNTHROID) 75 MCG tablet TAKE ONE-HALF TABLET EVERY DAY 04/16/20   Scott, Charlene, MD  lisinopril (ZESTRIL) 20 MG tablet TAKE (1) TABLET BY MOUTH EVERY DAY 01/07/21     Einar Pheasant, MD  memantine [facility]) 5 MG tablet Take 2 tablets by mouth at bedtime. 09/26/20 09/26/21  [provider]  metoprolol tartrate (LOPRESSOR) 25 MG tablet TAKE (1) TABLET BY MOUTH TWICE DAILY 02/11/21   Einar Pheasant, MD  Syringe/Needle, Disp, (SYRINGE 3CC/25GX1") 25G X 1" 3 ML MISC Use as directed to administer b12 injections. 06/07/19   Einar Pheasant, MD    Physical Exam: Vitals:   06/17/21 1307 06/17/21 1604 06/17/21 1605 06/17/21 1641  BP:  (!) 150/60  (!) 182/75  Pulse:  80  79  Resp:  _0 Temp:       SpO2:   96% 97%  Weight: 67.1 kg     Height: 5' 3" (1.6 m)      General: Not in acute distress HEENT:       Eyes: PERRL, EOMI, no scleral icterus.       ENT: No discharge from the ears and nose       Neck: No JVD, no bruit, no mass felt. Heme: No neck lymph node enlargement. Cardiac: S1/S2, RRR, No murmurs, No gallops or rubs. Respiratory: No rales, wheezing, rhonchi or rubs. GI: Soft, nondistended, nontender, organomegaly, BS present. GU: No hematuria Ext: No pitting leg edema bilaterally. 1+DP/PT pulse bilaterally. Musculoskeletal: has mild swelling, erythema, tenderness and warmth in left middle finger, has a blood blister     Skin: No rashes.  Neuro: Alert, following commands, cranial nerves II-XII grossly intact, moves all extremities normally. Psych: Patient is not psychotic  Labs on Admission: I have personally reviewed following labs and imaging studies  CBC: Recent Labs  Lab 06/17/21 1448  WBC 8.8  NEUTROABS 6.2  HGB 13.3  HCT 43.0  MCV 87.2  PLT 510   Basic Metabolic Panel: Recent Labs  Lab 06/17/21 1448  NA 139  K 4.0  CL 103  CO2 27  GLUCOSE 101*  BUN 18  CREATININE 1.00  CALCIUM 9.3   GFR: Estimated Creatinine Clearance: 37.2 mL/min (by C-G formula based on SCr of 1 mg/dL). Liver Function Tests: No results for input(s): AST, ALT, ALKPHOS, BILITOT, PROT, ALBUMIN in the last 168 hours. No results for input(s): LIPASE, AMYLASE in the last 168 hours. No results for input(s): AMMONIA in the last 168 hours. Coagulation Profile: No results for input(s): INR, PROTIME in the last 168 hours. Cardiac Enzymes: No results for input(s): CKTOTAL, CKMB, CKMBINDEX, TROPONINI in the last 168 hours. BNP (last 3 results) No results for input(s): PROBNP in the last 8760 hours. HbA1C: No results for input(s): HGBA1C in the last 72 hours. CBG: No results for input(s): GLUCAP in the last 168 hours. Lipid Profile: No results for input(s): CHOL, HDL, LDLCALC,  TRIG, CHOLHDL, LDLDIRECT in the last 72 hours. Thyroid Function Tests: No results for input(s): TSH, T4TOTAL, FREET4, T3FREE, THYROIDAB in the last 72 hours. Anemia Panel: No results for input(s): VITAMINB12, FOLATE, FERRITIN, TIBC, IRON, RETICCTPCT in the last 72 hours. Urine analysis:    Component Value Date/Time   COLORURINE YELLOW (A) 10/30/2020 0242   APPEARANCEUR HAZY (A) 10/30/2020 0242   APPEARANCEUR Hazy 11/04/2011 0934   LABSPEC 1.018 10/30/2020 0242   LABSPEC 1.010 11/04/2011 0934   PHURINE 9.0 (H) 10/30/2020 0242   GLUCOSEU NEGATIVE 10/30/2020 0242   GLUCOSEU Negative 11/04/2011 0934   HGBUR NEGATIVE 10/30/2020 [place] NEGATIVE 10/30/2020 0242   BILIRUBINUR neg 11/11/2011 1150   BILIRUBINUR Negative 11/04/2011 0934   KETONESUR 5 (A) 10/30/2020 0242  PROTEINUR 30 (A) 10/30/2020 0242   UROBILINOGEN 0.2 11/11/2011 1150   NITRITE NEGATIVE 10/30/2020 0242   LEUKOCYTESUR NEGATIVE 10/30/2020 0242   LEUKOCYTESUR Negative 11/04/2011 0934   Sepsis Labs: @LABRCNTIP(procalcitonin:4,lacticidven:4) )No results found for this or any previous visit (from the past 240 hour(s)).   Radiological Exams on Admission: DG Hand Complete Left  Result Date: 06/17/2021 CLINICAL DATA:  Middle finger injury. EXAM: LEFT HAND - COMPLETE 3+ VIEW COMPARISON:  None Available. FINDINGS: Cortical irregularity at the volar base of the third distal phalanx. No dislocation. Mild osteoarthritis of the first CMC joint and first IP joint. Asymmetric soft tissue swelling of the distal long finger. No radiopaque foreign body identified. IMPRESSION: 1. Asymmetric soft tissue swelling of the distal long finger. Cortical irregularity at the volar base of the third distal phalanx may reflect a tiny avulsion fracture. Electronically Signed   By: William T Derry M.D.   On: 06/17/2021 13:41      Assessment/Plan Principal Problem:   Finger infection Active Problems:   Hypertension, essential   Dementia  without behavioral disturbance (HCC)   Hypothyroidism   CAD (coronary artery disease)   Diabetes mellitus with circulatory complication (HCC)   COVID-19 virus infection   Chronic kidney disease, stage 3a (HCC)   Principal Problem:   Finger infection Active Problems:   Hypertension, essential   Dementia without behavioral disturbance (HCC)   Hypothyroidism   CAD (coronary artery disease)   Diabetes mellitus with circulatory complication (HCC)   COVID-19 virus infection   Chronic kidney disease, stage 3a (HCC)   Assessment and Plan: * Finger infection Pt has infection with cellulitis in the left middle finger.  X-ray showed possible tiny avulsion fracture.  Dr. Patel for Ortho is consulted. Per Dr. Patel, no significant concern for flexor tenosynovitis at this time. No plan for surgical intervention at this time.  he recommend nonweightbearing with the left hand and placing in a splint.  Patient does not have fever or leukocytosis.  Does not meet criteria for sepsis.  - Placed on MedSurg bed for observation - place splint - Empiric antimicrobial treatment with Rocephin (patient also received 1 dose of vancomycin in ED) - PRN Zofran for nausea, Tylenol and Percocet for pain - Blood cultures x 2  - ESR and CRP   Chronic kidney disease, stage 3a (HCC) Stable - no acute issues    COVID-19 virus infection Patient reportedly had positive COVID test at least 5 days ago in facility.  Currently pt is asymptomatic.  Oxygen saturation 96% on room air.  No antiviral treatment needed. -Supportive care -As needed albuterol and Mucinex if she develops symptoms.  Diabetes mellitus with circulatory complication (HCC) Diet-controlled diabetes.  Recent A1c 6.0.  Well-controlled.  Patient's not taking medications currently.  Blood sugar 101 -Check blood sugar every morning  CAD (coronary artery disease) No CP. -ASA   Hypothyroidism - Synthroid 75 mcg daily  Dementia without  behavioral disturbance (HCC) Patient is calm now. -Continue home Namenda, donepezil -As needed Xanax -Ativan 0.5 mg twice daily  Hypertension, essential - As needed hydralazine IV -Lisinopril, metoprolol             DVT ppx: Lovenox  Code Status: Full code per his husband grandson  Family Communication:Yes, patient's husband and grandson   at bed side.    Disposition Plan:  Anticipate discharge back to previous environment, SNF  Consults called: Dr. Patel of Ortho  Admission status and Level of care: Med-Surg:  for obs          Severity of Illness:  The appropriate patient status for this patient is OBSERVATION. Observation status is judged to be reasonable and necessary in order to provide the required intensity of service to ensure the patient's safety. The patient's presenting symptoms, physical exam findings, and initial radiographic and laboratory data in the context of their medical condition is felt to place them at decreased risk for further clinical deterioration. Furthermore, it is anticipated that the patient will be medically stable for discharge from the hospital within 2 midnights of admission.        Date of Service 06/17/2021      [facility]   If 7PM-7AM, please contact night-coverage [email or web address] 06/17/2021, 5:50 PM  

## 2021-06-18 DIAGNOSIS — Z955 Presence of coronary angioplasty implant and graft: Secondary | ICD-10-CM | POA: Diagnosis not present

## 2021-06-18 DIAGNOSIS — E1122 Type 2 diabetes mellitus with diabetic chronic kidney disease: Secondary | ICD-10-CM | POA: Diagnosis present

## 2021-06-18 DIAGNOSIS — I129 Hypertensive chronic kidney disease with stage 1 through stage 4 chronic kidney disease, or unspecified chronic kidney disease: Secondary | ICD-10-CM | POA: Diagnosis present

## 2021-06-18 DIAGNOSIS — F05 Delirium due to known physiological condition: Secondary | ICD-10-CM

## 2021-06-18 DIAGNOSIS — F03918 Unspecified dementia, unspecified severity, with other behavioral disturbance: Secondary | ICD-10-CM

## 2021-06-18 DIAGNOSIS — E039 Hypothyroidism, unspecified: Secondary | ICD-10-CM | POA: Diagnosis present

## 2021-06-18 DIAGNOSIS — Z79890 Hormone replacement therapy (postmenopausal): Secondary | ICD-10-CM | POA: Diagnosis not present

## 2021-06-18 DIAGNOSIS — X58XXXA Exposure to other specified factors, initial encounter: Secondary | ICD-10-CM | POA: Diagnosis present

## 2021-06-18 DIAGNOSIS — U071 COVID-19: Secondary | ICD-10-CM | POA: Diagnosis present

## 2021-06-18 DIAGNOSIS — Z9049 Acquired absence of other specified parts of digestive tract: Secondary | ICD-10-CM | POA: Diagnosis not present

## 2021-06-18 DIAGNOSIS — Z881 Allergy status to other antibiotic agents status: Secondary | ICD-10-CM | POA: Diagnosis not present

## 2021-06-18 DIAGNOSIS — L089 Local infection of the skin and subcutaneous tissue, unspecified: Secondary | ICD-10-CM | POA: Diagnosis present

## 2021-06-18 DIAGNOSIS — Z8249 Family history of ischemic heart disease and other diseases of the circulatory system: Secondary | ICD-10-CM | POA: Diagnosis not present

## 2021-06-18 DIAGNOSIS — E1159 Type 2 diabetes mellitus with other circulatory complications: Secondary | ICD-10-CM | POA: Diagnosis present

## 2021-06-18 DIAGNOSIS — S62663A Nondisplaced fracture of distal phalanx of left middle finger, initial encounter for closed fracture: Secondary | ICD-10-CM | POA: Diagnosis present

## 2021-06-18 DIAGNOSIS — Z79899 Other long term (current) drug therapy: Secondary | ICD-10-CM | POA: Diagnosis not present

## 2021-06-18 DIAGNOSIS — Z888 Allergy status to other drugs, medicaments and biological substances status: Secondary | ICD-10-CM | POA: Diagnosis not present

## 2021-06-18 DIAGNOSIS — I2510 Atherosclerotic heart disease of native coronary artery without angina pectoris: Secondary | ICD-10-CM | POA: Diagnosis present

## 2021-06-18 DIAGNOSIS — E7800 Pure hypercholesterolemia, unspecified: Secondary | ICD-10-CM | POA: Diagnosis present

## 2021-06-18 DIAGNOSIS — F03911 Unspecified dementia, unspecified severity, with agitation: Secondary | ICD-10-CM | POA: Diagnosis present

## 2021-06-18 DIAGNOSIS — I252 Old myocardial infarction: Secondary | ICD-10-CM | POA: Diagnosis not present

## 2021-06-18 DIAGNOSIS — Z801 Family history of malignant neoplasm of trachea, bronchus and lung: Secondary | ICD-10-CM | POA: Diagnosis not present

## 2021-06-18 DIAGNOSIS — Z803 Family history of malignant neoplasm of breast: Secondary | ICD-10-CM | POA: Diagnosis not present

## 2021-06-18 DIAGNOSIS — N1831 Chronic kidney disease, stage 3a: Secondary | ICD-10-CM | POA: Diagnosis present

## 2021-06-18 DIAGNOSIS — L03012 Cellulitis of left finger: Secondary | ICD-10-CM | POA: Diagnosis present

## 2021-06-18 LAB — CBC
HCT: 41.2 % (ref 36.0–46.0)
Hemoglobin: 13 g/dL (ref 12.0–15.0)
MCH: 26.9 pg (ref 26.0–34.0)
MCHC: 31.6 g/dL (ref 30.0–36.0)
MCV: 85.3 fL (ref 80.0–100.0)
Platelets: 301 10*3/uL (ref 150–400)
RBC: 4.83 MIL/uL (ref 3.87–5.11)
RDW: 13.4 % (ref 11.5–15.5)
WBC: 14.6 10*3/uL — ABNORMAL HIGH (ref 4.0–10.5)
nRBC: 0 % (ref 0.0–0.2)

## 2021-06-18 LAB — C-REACTIVE PROTEIN: CRP: 2.4 mg/dL — ABNORMAL HIGH (ref <1.0)

## 2021-06-18 MED ORDER — QUETIAPINE FUMARATE 25 MG PO TABS
25.0000 mg | ORAL_TABLET | Freq: Every day | ORAL | Status: DC
Start: 2021-06-18 — End: 2021-06-20
  Administered 2021-06-18 – 2021-06-19 (×2): 25 mg via ORAL
  Filled 2021-06-18 (×2): qty 1

## 2021-06-18 NOTE — Progress Notes (Addendum)
  Progress Note   Patient: Amanda [name] ZOX:[id number] DOB: 09-06-1933 DOA: 06/17/2021     0 DOS: the patient was seen and examined on 06/18/2021   Brief hospital course:  Pt has infection with cellulitis in the left middle finger.  X-ray showed possible tiny avulsion fracture.  No need for surgical intervention per orthopedics. Patient was placed on Rocephin after initial dose of vancomycin was given in the emergency room.  Patient also has positive COVID, she was delirious last night with significant agitation.   Assessment and Plan: Left middle finger cellulitis Left middle finger tiny fracture. Patient middle finger has a blister from prior trauma.  Mild redness in the finger.  Rocephin is appropriate.  No need for additional vancomycin.  Change to oral antibiotic tomorrow.  Dementia with delirium. COVID infection. Patient has positive COVID, but asymptomatic, no hypoxemia.  No need for treatment.  However, patient was very delirious last night.  This could be secondary to new environment most of the COVID infection.  I will start Seroquel at nighttime to improve his sleeping pattern.  Chronic kidney disease stage IIIa. Renal function still stable.  Type 2 diabetes. Trolled.  Essential hypertension. Coronary artery disease. Condition stable       Subjective:  She was very agitated last night, currently she is sleeping.   Physical Exam: Vitals:   06/17/21 2212 06/17/21 2233 06/18/21 0633 06/18/21 0925  BP: (!) 130/51 125/62 (!) 121/58 136/64  Pulse: 95 (!) 101 71 70  Resp: 16 16 18 16   Temp: 98.9 F (37.2 C) 99.7 F (37.6 C) 97.7 F (36.5 C) (!) 97.4 F (36.3 C)  TempSrc: Oral Oral    SpO2: 100% 100% 97% 99%  Weight:      Height:       General exam: Appears calm and comfortable  Respiratory system: Clear to auscultation. Respiratory effort normal. Cardiovascular system: S1 & S2 heard, RRR. No JVD, murmurs, rubs, gallops or clicks. No pedal  edema. Gastrointestinal system: Abdomen is nondistended, soft and nontender. No organomegaly or masses felt. Normal bowel sounds heard. Central nervous system: Sleeping, arousable.  Extremities: left middle finger swelling with large blister, some redness Skin: No rashes, lesions or ulcers   Data Reviewed:  Lab results and x-ray reviewed.  Family Communication: Husband updated at bedside.  Disposition: Status is: Inpatient Remains inpatient appropriate because: Severity of disease, IV antibiotics.  Planned Discharge Destination: Skilled nursing facility    Time spent: 28 minutes  Author: , MD 06/18/2021 1:57 PM  For on call review [email or web address].

## 2021-06-18 NOTE — Plan of Care (Signed)

## 2021-06-18 NOTE — Plan of Care (Signed)

## 2021-06-19 DIAGNOSIS — L089 Local infection of the skin and subcutaneous tissue, unspecified: Secondary | ICD-10-CM | POA: Diagnosis not present

## 2021-06-19 LAB — CBC
HCT: 40 % (ref 36.0–46.0)
Hemoglobin: 12.8 g/dL (ref 12.0–15.0)
MCH: 27.4 pg (ref 26.0–34.0)
MCHC: 32 g/dL (ref 30.0–36.0)
MCV: 85.7 fL (ref 80.0–100.0)
Platelets: 281 10*3/uL (ref 150–400)
RBC: 4.67 MIL/uL (ref 3.87–5.11)
RDW: 13.6 % (ref 11.5–15.5)
WBC: 10.4 10*3/uL (ref 4.0–10.5)
nRBC: 0 % (ref 0.0–0.2)

## 2021-06-19 LAB — GLUCOSE, CAPILLARY
Glucose-Capillary: 97 mg/dL (ref 70–99)
Glucose-Capillary: 99 mg/dL (ref 70–99)

## 2021-06-19 MED ORDER — QUETIAPINE FUMARATE 25 MG PO TABS: 25.0000 mg | ORAL_TABLET | Freq: Every day | ORAL | 0 refills | Status: DC

## 2021-06-19 MED ORDER — OXYCODONE-ACETAMINOPHEN 5-325 MG PO TABS: 1.0000 | ORAL_TABLET | Freq: Four times a day (QID) | ORAL | 0 refills | Status: DC | PRN

## 2021-06-19 MED ORDER — CEPHALEXIN 500 MG PO CAPS: 500.0000 mg | ORAL_CAPSULE | Freq: Three times a day (TID) | ORAL | 0 refills | Status: AC

## 2021-06-19 MED ORDER — CEPHALEXIN 500 MG PO CAPS
500.0000 mg | ORAL_CAPSULE | Freq: Three times a day (TID) | ORAL | Status: DC
  Administered 2021-06-19 – 2021-06-20 (×3): 500 mg via ORAL
  Filled 2021-06-19 (×3): qty 1

## 2021-06-19 NOTE — NC FL2 (Signed)
[place] MEDICAID FL2 LEVEL OF CARE SCREENING TOOL     IDENTIFICATION  Patient Name: Amanda [name] Birthdate: Oct 04, 1933 Sex: female Admission Date (Current Location): 06/17/2021  [facility] and [place] Number:  Chiropodist and Address:  [facility], [address] [place], [place] [address]      Provider Number: [id number]  Attending Physician Name and Address:  Sunnie Nielsen, DO  Relative Name and Phone Number:  Egbert Garibaldi [phone]    Current Level of Care: Hospital Recommended Level of Care: Skilled Nursing Facility Prior Approval Number:    Date Approved/Denied:   PASRR Number:    Discharge Plan: Other (Comment) (memory care)    Current Diagnoses: Patient Active Problem List   Diagnosis Date Noted   Nondisplaced fracture of distal phalanx of left middle finger, initial encounter for closed fracture 06/18/2021   Senile dementia, delirium, with behavioral disturbance (HCC) 06/18/2021   Finger infection 06/17/2021   COVID-19 virus infection    Chronic kidney disease, stage 3a (HCC)    Aortic atherosclerosis (HCC) 08/03/2020   Renal cyst 02/01/2019   Anemia 02/01/2019   Acute ST elevation myocardial infarction (STEMI) of inferior wall [facility])    STEMI involving right coronary artery (HCC) 01/21/2019   Unstable angina pectoris (HCC) 01/19/2019   Abdominal bruit 09/14/2017   Muscle ache 03/06/2017   Fall 12/31/2014   Health care maintenance 05/20/2014   Diabetes mellitus with circulatory complication (HCC) 05/17/2013   CAD (coronary artery disease) 05/15/2012   Osteopenia 12/14/2011   Hypertension, essential 11/13/2011   Hypercholesteremia 11/13/2011   Dementia without behavioral disturbance (HCC) 11/13/2011   Hypothyroidism 11/13/2011    Orientation RESPIRATION BLADDER Height & Weight     Self, Place  Normal Continent Weight: 67.1 kg Height:  5\' 3"  (160 cm)  BEHAVIORAL SYMPTOMS/MOOD NEUROLOGICAL BOWEL NUTRITION  STATUS      Continent Diet (see dc summary)  AMBULATORY STATUS COMMUNICATION OF NEEDS Skin   Limited Assist Verbally Normal, Bruising (finger)                       Personal Care Assistance Level of Assistance  Bathing, Feeding, Dressing, Total care Bathing Assistance: Limited assistance Feeding assistance: Independent Dressing Assistance: Limited assistance Total Care Assistance: Limited assistance   Functional Limitations Info             SPECIAL CARE FACTORS FREQUENCY                       Contractures Contractures Info: Not present    Additional Factors Info  Code Status, Allergies Code Status Info: full code Allergies Info: : Atorvastatin, Sulfa Antibiotics           Current Medications (06/19/2021):  This is the current hospital active medication list Current Facility-Administered Medications  Medication Dose Route Frequency Provider Last Rate Last Admin   acetaminophen (TYLENOL) tablet 650 mg  650 mg Oral Q6H PRN 06/21/2021, MD       albuterol (PROVENTIL) (2.5 MG/3ML) 0.083% nebulizer solution 2.5 mg  2.5 mg Inhalation Q4H PRN Lorretta Harp, MD       ALPRAZolam Lorretta Harp) tablet 0.5 mg  0.5 mg Oral BID PRN Prudy Feeler, MD   0.5 mg at 06/19/21 1400   aspirin chewable tablet 81 mg  81 mg Oral Daily 06/21/21, MD   81 mg at 06/19/21 06/21/21   cefTRIAXone (ROCEPHIN) 1 g in sodium chloride 0.9 % 100 mL IVPB  1 g Intravenous Q24H Lorretta Harp, MD   Stopped at 06/18/21 1944   dextromethorphan-guaiFENesin (MUCINEX DM) 30-600 MG per 12 hr tablet 1 tablet  1 tablet Oral BID PRN Lorretta Harp, MD       donepezil (ARICEPT) tablet 10 mg  10 mg Oral QHS Lorretta Harp, MD   10 mg at 06/18/21 2001   enoxaparin (LOVENOX) injection 40 mg  40 mg Subcutaneous Q24H Lorretta Harp, MD   40 mg at 06/18/21 2001   hydrALAZINE (APRESOLINE) injection 5 mg  5 mg Intravenous Q2H PRN Lorretta Harp, MD       levothyroxine (SYNTHROID) tablet 75 mcg  75 mcg Oral Q0600 Lorretta Harp, MD   75 mcg at 06/19/21 6578    lisinopril (ZESTRIL) tablet 20 mg  20 mg Oral Daily Lorretta Harp, MD   20 mg at 06/19/21 4696   LORazepam (ATIVAN) tablet 0.5 mg  0.5 mg Oral BID Lorretta Harp, MD   0.5 mg at 06/19/21 2952   memantine (NAMENDA) tablet 10 mg  10 mg Oral QHS Lorretta Harp, MD   10 mg at 06/18/21 2000   metoprolol tartrate (LOPRESSOR) tablet 25 mg  25 mg Oral BID Lorretta Harp, MD   25 mg at 06/19/21 0821   ondansetron (ZOFRAN) injection 4 mg  4 mg Intravenous Q8H PRN Lorretta Harp, MD       oxyCODONE-acetaminophen (PERCOCET/ROXICET) 5-325 MG per tablet 1 tablet  1 tablet Oral Q4H PRN Lorretta Harp, MD   1 tablet at 06/19/21 8413   QUEtiapine (SEROQUEL) tablet 25 mg  25 mg Oral QHS Marrion Coy, MD   25 mg at 06/18/21 2000     Discharge Medications: Please see discharge summary for a list of discharge medications.  Relevant Imaging Results:  Relevant Lab Results:   Additional Information SS# [id number]  Marlowe Sax, RN

## 2021-06-19 NOTE — Care Management (Signed)
Pre-rounds chart review and preliminary plan / goals for today: Significant overnight events per chart: none Significant new findings on labs/other diagnostics: WBC were trending up but no labs this AM. Blood cultures NG x2 days Dispo plan: back to SNF, will confirm w/ TOC given recent (+)COVID Pending/Plan: AM CBC. If WBC trending down plan to transition to po abx and anticipate discharge today/tomorrow   Please feel free to reach out via secure chat in Epic for non-urgent issues. Please page for urgent matters!  This note will be updated after rounds to full progress note / discharge note as appropriate

## 2021-06-19 NOTE — TOC Progression Note (Signed)
Transition of Care [facility]) - Progression Note    Patient Details  Name: Amanda [name] MRN: [id number] Date of Birth: 03-May-1933  Transition of Care [facility]) CM/SW Mabel, RN Phone Number: 06/19/2021, 11:15 AM  Clinical Narrative:     Reached out to [facility], was placed in VM, left a message yesterday and again today with Homero Fellers Admissions director at [facility]       Expected Discharge Plan and Services                                                 Social Determinants of Health (SDOH) Interventions    Readmission Risk Interventions     View : No data to display.

## 2021-06-19 NOTE — Progress Notes (Signed)
Vitals entered manually ° °

## 2021-06-19 NOTE — TOC Progression Note (Signed)
Transition of Care [facility]) - Progression Note    Patient Details  Name: Amanda [name] MRN: [id number] Date of Birth: 03-23-1933  Transition of Care [facility]) CM/SW Contact  Marlowe Sax, RN Phone Number: 06/19/2021, 2:21 PM  Clinical Narrative:     Sherron Monday with [place] the Admissions director with [facility], SHE will come and assess the patient to go back to Memory care [facility] She will come to assess within the next hour  Expected Discharge Plan: Assisted Living Barriers to Discharge: Barriers Resolved  Expected Discharge Plan and Services Expected Discharge Plan: Assisted Living       Living arrangements for the past 2 months: Assisted Living Facility                                       Social Determinants of Health (SDOH) Interventions    Readmission Risk Interventions     View : No data to display.

## 2021-06-19 NOTE — Discharge Summary (Signed)
Physician Discharge Summary  Amanda [name]:[id number] DOB: [birth year] DOA: 06/17/2021  PCP: [name], MD  Admit date: 06/17/2021 Discharge date: 06/19/2021  Admitted From: assisted living Disposition:  assisted living  Recommendations for Outpatient Follow-up:  Follow up with PCP and orthopedics in 1-2 weeks Please obtain labs/tests: none Please follow up on the following pending results: none  Home Health: n/a  Equipment/Devices: n/a  Discharge Condition: good  CODE STATUS: FULL  Diet recommendation:  Diet Orders (From admission, onward)     Start     Ordered   06/19/21 0000  Diet - low sodium heart healthy        06/19/21 1728   06/17/21 1650  Diet Heart Room service appropriate? Yes; Fluid consistency: Thin  Diet effective now       Question Answer Comment  Room service appropriate? Yes   Fluid consistency: Thin   Na restriction, if any: 2 gm Na      06/17/21 1649             Brief/Interim Summary: Pt has infection with cellulitis in the left middle finger.  X-ray showed possible tiny avulsion fracture.  No need for surgical intervention per orthopedics. Patient was placed on Rocephin after initial dose of vancomycin was given in the emergency room.  Patient also has positive COVID, she was delirious and agitate, Seroquel was added which seemed to help. Awaiting her assisted living facility to accept her back - this is only barrier to discharge at this time   Consultants:  Orthopedics   Procedures:  none      Discharge Diagnoses: Principal Problem:   Finger infection Active Problems:   Hypertension, essential   Dementia without behavioral disturbance (HCC)   Hypothyroidism   CAD (coronary artery disease)   Diabetes mellitus with circulatory complication (HCC)   COVID-19 virus infection   Chronic kidney disease, stage 3a (HCC)   Nondisplaced fracture of distal phalanx of left middle finger, initial encounter for closed fracture   Senile  dementia, delirium, with behavioral disturbance (HCC)    Assessment & Plan: Finger infection Pt has infection with cellulitis in the left middle finger.  X-ray showed possible tiny avulsion fracture.  Dr. [name] for Ortho is consulted. Per Dr. [name], no significant concern for flexor tenosynovitis at this time. No plan for surgical intervention at this time.  he recommend nonweightbearing with the left hand and placing in a splint.  Patient does not have fever or leukocytosis.  Does not meet criteria for sepsis. Pt intolerant of splint and keeps removing this. Will trial at least coban wrap or buddy tape to adjacent finger but ideally this should be splinted. Rocephin transitioned to keflex po    CAD (coronary artery disease) No CP. Continue ASA   Chronic kidney disease, stage 3a (HCC) Stable no acute issues    COVID-19 virus infection Patient reportedly had positive COVID test at least 5 days ago in facility.  Currently pt is asymptomatic.  Oxygen saturation 96% on room air.  No antiviral treatment needed. Supportive care As needed albuterol and Mucinex if she develops symptoms. Have been waiting for facility to accept patient back as of 06/19/21   Dementia without behavioral disturbance (HCC) continue home Namenda, donepezil. Minimize Xanax. Added seroquel qhs   Diabetes mellitus with circulatory complication (HCC) Diet-controlled diabetes.  Recent A1c 6.0.  Well-controlled.  Patient's not taking medications currently.  Blood sugar 101  Hypertension, essential Resume home lisinopril, metoprolol  Hypothyroidism Continue home Synthroid 75  mcg daily  Nondisplaced fracture of distal phalanx of left middle finger, initial encounter for closed fracture  X-ray showed possible tiny avulsion fracture.  Dr. [name] for Ortho is consulted. Per Dr. [name], no significant concern for flexor tenosynovitis at this time. No plan for surgical intervention at this time.  he recommend nonweightbearing  with the left hand and placing in a splint.  Pt intolerant of splint and keeps removing this. Will trial at least coban wrap or buddy tape to adjacent finger but ideally this should be splinted.      Discharge Instructions  Discharge Instructions     Call MD for:  redness, tenderness, or signs of infection (pain, swelling, redness, odor or green/yellow discharge around incision site)   Complete by: As directed    Call MD for:  severe uncontrolled pain   Complete by: As directed    Call MD for:  temperature >100.4   Complete by: As directed    Diet - low sodium heart healthy   Complete by: As directed    Discharge instructions   Complete by: As directed    Antibiotics as directed to complete course for finger infection  Pain medication as needed for finger pain Keep finger clean, wrapped Seroquel (sleep aid, helps agitation) ok to continue but can stop this if it causes significant drowsiness   Increase activity slowly   Complete by: As directed       Allergies as of 06/19/2021       Reactions   Atorvastatin Other (See Comments)   Muscle Pain   Sulfa Antibiotics         Medication List     TAKE these medications    ALPRAZolam 0.5 MG tablet Commonly known as: XANAX Take 0.5 mg by mouth 2 (two) times daily as needed for anxiety (PRN for sundowning days).   aspirin 81 MG chewable tablet Chew 1 tablet (81 mg total) by mouth daily.   cephALEXin 500 MG capsule Commonly known as: KEFLEX Take 1 capsule (500 mg total) by mouth every 8 (eight) hours for 5 days.   donepezil 10 MG tablet Commonly known as: ARICEPT TAKE ONE TABLET AT BEDTIME.   levothyroxine 75 MCG tablet Commonly known as: SYNTHROID TAKE ONE-HALF TABLET EVERY DAY What changed: See the new instructions.   lisinopril 20 MG tablet Commonly known as: ZESTRIL TAKE (1) TABLET BY MOUTH EVERY DAY   LORazepam 0.5 MG tablet Commonly known as: ATIVAN Take 0.5 mg by mouth 2 (two) times daily.   memantine  5 MG tablet Commonly known as: NAMENDA Take 2 tablets by mouth at bedtime.   metoprolol tartrate 25 MG tablet Commonly known as: LOPRESSOR TAKE (1) TABLET BY MOUTH TWICE DAILY   oxyCODONE-acetaminophen 5-325 MG tablet Commonly known as: PERCOCET/ROXICET Take 1 tablet by mouth every 6 (six) hours as needed for moderate pain or severe pain.   QUEtiapine 25 MG tablet Commonly known as: SEROQUEL Take 1 tablet (25 mg total) by mouth at bedtime.   SYRINGE 3CC/25GX1" 25G X 1" 3 ML Misc Use as directed to administer b12 injections.       Diet Orders (From admission, onward)     Start     Ordered   06/19/21 0000  Diet - low sodium heart healthy        06/19/21 1728   06/17/21 1650  Diet Heart Room service appropriate? Yes; Fluid consistency: Thin  Diet effective now       Question Answer Comment  Room  service appropriate? Yes   Fluid consistency: Thin   Na restriction, if any: 2 gm Na      06/17/21 1649              Contact information for after-discharge care     Destination     [facility] [facility] .   Service: Group Home Contact information: [address] [place] [id number] [phone]                    Allergies  Allergen Reactions   Atorvastatin Other (See Comments)    Muscle Pain   Sulfa Antibiotics      Subjective: Pt has no complaints, husband is at bedside reports she is about at her baseline. Per RN patient is not keeping splint on her finger.    Discharge Exam: Vitals:   06/19/21 0936 06/19/21 1721  BP: (!) 105/53 117/75  Pulse: (!) 59 81  Resp:  16  Temp: 98.1 F (36.7 C) 98.3 F (36.8 C)  SpO2: 95% 99%   Vitals:   06/18/21 1956 06/19/21 0620 06/19/21 0936 06/19/21 1721  BP: 130/60 125/80 (!) 105/53 117/75  Pulse: 80 70 (!) 59 81  Resp: 17 16  16   Temp: 98 F (36.7 C) 97.9 F (36.6 C) 98.1 F (36.7 C) 98.3 F (36.8 C)  TempSrc: Oral Oral  Oral  SpO2: 100% 96% 95% 99%  Weight:       Height:        General: Pt is alert, awake, not in acute distress Cardiovascular: RRR, S1/S2 +, no rubs, no gallops Respiratory: CTA bilaterally, no wheezing, no rhonchi Abdominal: Soft, NT, ND, bowel sounds + Extremities: no edema, no cyanosis. Ecchymosis and serous drainage L middle finger, normal temperature and not tender      The results of significant diagnostics from this hospitalization (including imaging, microbiology, ancillary and laboratory) are listed below for reference.     Microbiology: Recent Results (from the past 240 hour(s))  Resp Panel by RT-PCR (Flu A&B, Covid) Nasopharyngeal Swab     Status: Abnormal   Collection Time: 06/17/21  4:58 PM   Specimen: Nasopharyngeal Swab; Nasopharyngeal(NP) swabs in vial transport medium  Result Value Ref Range Status   SARS Coronavirus 2 by RT PCR POSITIVE (A) NEGATIVE Final    Comment: (NOTE) SARS-CoV-2 target nucleic acids are DETECTED.  The SARS-CoV-2 RNA is generally detectable in upper respiratory specimens during the acute phase of infection. Positive results are indicative of the presence of the identified virus, but do not rule out bacterial infection or co-infection with other pathogens not detected by the test. Clinical correlation with patient history and other diagnostic information is necessary to determine patient infection status. The expected result is Negative.  Fact Sheet for Patients: BloggerCourse.comhttps://www.fda.gov/media/152166/download  Fact Sheet for Healthcare Providers: SeriousBroker.ithttps://www.fda.gov/media/152162/download  This test is not yet approved or cleared by the [place] FDA and  has been authorized for detection and/or diagnosis of SARS-CoV-2 by FDA under an Emergency Use Authorization (EUA).  This EUA will remain in effect (meaning this test can be used) for the duration of  the COVID-19 declaration under Section 564(b)(1) of the A ct, 21 U.S.C. section 360bbb-3(b)(1), unless the authorization  is terminated or revoked sooner.     Influenza A by PCR NEGATIVE NEGATIVE Final   Influenza B by PCR NEGATIVE NEGATIVE Final    Comment: (NOTE) The Xpert Xpress SARS-CoV-2/FLU/RSV plus assay is intended as an aid in  the diagnosis of influenza from Nasopharyngeal swab specimens and should not be used as a sole basis for treatment. Nasal washings and aspirates are unacceptable for Xpert Xpress SARS-CoV-2/FLU/RSV testing.  Fact Sheet for Patients: BloggerCourse.com  Fact Sheet for Healthcare Providers: SeriousBroker.it  This test is not yet approved or cleared by the [place] FDA and has been authorized for detection and/or diagnosis of SARS-CoV-2 by FDA under an Emergency Use Authorization (EUA). This EUA will remain in effect (meaning this test can be used) for the duration of the COVID-19 declaration under Section 564(b)(1) of the Act, 21 U.S.C. section 360bbb-3(b)(1), unless the authorization is terminated or revoked.  Performed at [facility], [address] [place], [place] [address]   Culture, blood (Routine X 2) w Reflex to ID Panel     Status: None (Preliminary result)   Collection Time: 06/17/21 10:38 PM   Specimen: BLOOD  Result Value Ref Range Status   Specimen Description BLOOD LEFT HAND  Final   Special Requests   Final    BOTTLES DRAWN AEROBIC AND ANAEROBIC Blood Culture results may not be optimal due to an excessive volume of blood received in culture bottles   Culture   Final    NO GROWTH 2 DAYS Performed at [facility], [address] [place], [place] [address]    Report Status PENDING  Incomplete  Culture, blood (Routine X 2) w Reflex to ID Panel     Status: None (Preliminary result)   Collection Time: 06/17/21 10:38 PM   Specimen: BLOOD  Result Value Ref Range Status   Specimen Description BLOOD LEFT ASSIST CONTROL  Final   Special Requests IN PEDIATRIC BOTTLE Blood Culture  adequate volume  Final   Culture   Final    NO GROWTH 2 DAYS Performed at [facility], [address], [place], [place] [address]    Report Status PENDING  Incomplete     Labs: BNP (last 3 results) No results for input(s): BNP in the last 8760 hours. Basic Metabolic Panel: Recent Labs  Lab 06/17/21 1448  NA 139  K 4.0  CL 103  CO2 27  GLUCOSE 101*  BUN 18  CREATININE 1.00  CALCIUM 9.3   Liver Function Tests: No results for input(s): AST, ALT, ALKPHOS, BILITOT, PROT, ALBUMIN in the last 168 hours. No results for input(s): LIPASE, AMYLASE in the last 168 hours. No results for input(s): AMMONIA in the last 168 hours. CBC: Recent Labs  Lab 06/17/21 1448 06/18/21 0343 06/19/21 0926  WBC 8.8 14.6* 10.4  NEUTROABS 6.2  --   --   HGB 13.3 13.0 12.8  HCT 43.0 41.2 40.0  MCV 87.2 85.3 85.7  PLT 287 301 281   Cardiac Enzymes: No results for input(s): CKTOTAL, CKMB, CKMBINDEX, TROPONINI in the last 168 hours. BNP: Invalid input(s): POCBNP CBG: Recent Labs  Lab 06/19/21 0618 06/19/21 0738  GLUCAP 97 99   D-Dimer No results for input(s): DDIMER in the last 72 hours. Hgb A1c No results for input(s): HGBA1C in the last 72 hours. Lipid Profile No results for input(s): CHOL, HDL, LDLCALC, TRIG, CHOLHDL, LDLDIRECT in the last 72 hours. Thyroid function studies No results for input(s): TSH, T4TOTAL, T3FREE, THYROIDAB in the last 72 hours.  Invalid input(s): FREET3 Anemia work up No results for input(s): VITAMINB12, FOLATE, FERRITIN, TIBC, IRON, RETICCTPCT in the last 72 hours. Urinalysis    Component Value Date/Time   COLORURINE YELLOW (A) 10/30/2020 0242   APPEARANCEUR HAZY (A)  10/30/2020 0242   APPEARANCEUR Hazy 11/04/2011 0934   LABSPEC 1.018 10/30/2020 0242   LABSPEC 1.010 11/04/2011 0934   PHURINE 9.0 (H) 10/30/2020 0242   GLUCOSEU NEGATIVE 10/30/2020 0242   GLUCOSEU Negative 11/04/2011 0934   HGBUR NEGATIVE 10/30/2020 0242   BILIRUBINUR  NEGATIVE 10/30/2020 0242   BILIRUBINUR neg 11/11/2011 1150   BILIRUBINUR Negative 11/04/2011 0934   KETONESUR 5 (A) 10/30/2020 0242   PROTEINUR 30 (A) 10/30/2020 0242   UROBILINOGEN 0.2 11/11/2011 1150   NITRITE NEGATIVE 10/30/2020 0242   LEUKOCYTESUR NEGATIVE 10/30/2020 0242   LEUKOCYTESUR Negative 11/04/2011 0934   Sepsis Labs Invalid input(s): PROCALCITONIN,  WBC,  LACTICIDVEN Microbiology Recent Results (from the past 240 hour(s))  Resp Panel by RT-PCR (Flu A&B, Covid) Nasopharyngeal Swab     Status: Abnormal   Collection Time: 06/17/21  4:58 PM   Specimen: Nasopharyngeal Swab; Nasopharyngeal(NP) swabs in vial transport medium  Result Value Ref Range Status   SARS Coronavirus 2 by RT PCR POSITIVE (A) NEGATIVE Final    Comment: (NOTE) SARS-CoV-2 target nucleic acids are DETECTED.  The SARS-CoV-2 RNA is generally detectable in upper respiratory specimens during the acute phase of infection. Positive results are indicative of the presence of the identified virus, but do not rule out bacterial infection or co-infection with other pathogens not detected by the test. Clinical correlation with patient history and other diagnostic information is necessary to determine patient infection status. The expected result is Negative.  Fact Sheet for Patients: BloggerCourse.com  Fact Sheet for Healthcare Providers: SeriousBroker.it  This test is not yet approved or cleared by the [place] FDA and  has been authorized for detection and/or diagnosis of SARS-CoV-2 by FDA under an Emergency Use Authorization (EUA).  This EUA will remain in effect (meaning this test can be used) for the duration of  the COVID-19 declaration under Section 564(b)(1) of the A ct, 21 U.S.C. section 360bbb-3(b)(1), unless the authorization is terminated or revoked sooner.     Influenza A by PCR NEGATIVE NEGATIVE Final   Influenza B by PCR NEGATIVE NEGATIVE  Final    Comment: (NOTE) The Xpert Xpress SARS-CoV-2/FLU/RSV plus assay is intended as an aid in the diagnosis of influenza from Nasopharyngeal swab specimens and should not be used as a sole basis for treatment. Nasal washings and aspirates are unacceptable for Xpert Xpress SARS-CoV-2/FLU/RSV testing.  Fact Sheet for Patients: BloggerCourse.com  Fact Sheet for Healthcare Providers: SeriousBroker.it  This test is not yet approved or cleared by the [place] FDA and has been authorized for detection and/or diagnosis of SARS-CoV-2 by FDA under an Emergency Use Authorization (EUA). This EUA will remain in effect (meaning this test can be used) for the duration of the COVID-19 declaration under Section 564(b)(1) of the Act, 21 U.S.C. section 360bbb-3(b)(1), unless the authorization is terminated or revoked.  Performed at [facility], [address] [place], [place] [address]   Culture, blood (Routine X 2) w Reflex to ID Panel     Status: None (Preliminary result)   Collection Time: 06/17/21 10:38 PM   Specimen: BLOOD  Result Value Ref Range Status   Specimen Description BLOOD LEFT HAND  Final   Special Requests   Final    BOTTLES DRAWN AEROBIC AND ANAEROBIC Blood Culture results may not be optimal due to an excessive volume of blood received in culture bottles   Culture   Final    NO GROWTH 2 DAYS Performed at [facility], [address]  [place], [place] [address]    Report Status PENDING  Incomplete  Culture, blood (Routine X 2) w Reflex to ID Panel     Status: None (Preliminary result)   Collection Time: 06/17/21 10:38 PM   Specimen: BLOOD  Result Value Ref Range Status   Specimen Description BLOOD LEFT ASSIST CONTROL  Final   Special Requests IN PEDIATRIC BOTTLE Blood Culture adequate volume  Final   Culture   Final    NO GROWTH 2 DAYS Performed at [facility], [address],  [place], [place] [address]    Report Status PENDING  Incomplete   Imaging DG Hand Complete Left  Result Date: 06/17/2021 CLINICAL DATA:  Middle finger injury. EXAM: LEFT HAND - COMPLETE 3+ VIEW COMPARISON:  None Available. FINDINGS: Cortical irregularity at the volar base of the third distal phalanx. No dislocation. Mild osteoarthritis of the first [facility] joint and first IP joint. Asymmetric soft tissue swelling of the distal long finger. No radiopaque foreign body identified. IMPRESSION: 1. Asymmetric soft tissue swelling of the distal long finger. Cortical irregularity at the volar base of the third distal phalanx may reflect a tiny avulsion fracture. Electronically Signed   By: Obie Dredge M.D.   On: 06/17/2021 13:41      Time coordinating discharge: Over 30 minutes  SIGNED:  Sunnie Nielsen DO [facility]

## 2021-06-19 NOTE — Assessment & Plan Note (Signed)
X-ray showed possible tiny avulsion fracture.  Dr. [name] for Ortho is consulted. Per Dr. [name], no significant concern for flexor tenosynovitis at this time. No plan for surgical intervention at this time.  he recommend nonweightbearing with the left hand and placing in a splint.  Pt intolerant of splint and keeps removing this. Will trial at least coban wrap or buddy tape to adjacent finger but ideally this should be splinted.

## 2021-06-20 DIAGNOSIS — L089 Local infection of the skin and subcutaneous tissue, unspecified: Secondary | ICD-10-CM | POA: Diagnosis not present

## 2021-06-20 NOTE — TOC Progression Note (Addendum)
Transition of Care [facility]) - Progression Note    Patient Details  Name: Amanda [name] MRN: [id number] Date of Birth: May 08, 1933  Transition of [facility]) CM/SW Contact  Marlowe Sax, RN Phone Number: 06/20/2021, 9:14 AM  Clinical Narrative:    Spoke with Richard the patient's grandson he will reach out to [facility] and see if they can accept the patient back.   Expected Discharge Plan: Assisted Living Barriers to Discharge: Barriers Resolved  Expected Discharge Plan and Services Expected Discharge Plan: Assisted Living       Living arrangements for the past 2 months: Assisted Living Facility Expected Discharge Date: 06/19/21                                     Social Determinants of Health (SDOH) Interventions    Readmission Risk Interventions     View : No data to display.

## 2021-06-20 NOTE — TOC Progression Note (Signed)
Transition of Care [facility]) - Progression Note    Patient Details  Name: Amanda [name] MRN: [id number] Date of Birth: 1934/01/26  [facility]) CM/SW Contact  Marlowe Sax, RN Phone Number: 06/20/2021, 8:50 AM  Clinical Narrative:     [facility] and requested to speak to admissions [place], she is not in, I was transferred to front desk and VM of kelly, I left another VM for her and requested a call back  Expected Discharge Plan: Assisted Living Barriers to Discharge: Barriers Resolved  Expected Discharge Plan and Services Expected Discharge Plan: Assisted Living       Living arrangements for the past 2 months: Assisted Living Facility Expected Discharge Date: 06/19/21                                     Social Determinants of Health (SDOH) Interventions    Readmission Risk Interventions     View : No data to display.

## 2021-06-20 NOTE — Discharge Summary (Signed)
Physician Discharge Summary  Amanda DockerJean N [name] ZOX:[id number] DOB: January 20, 1934 DOA: 06/17/2021  PCP: Dale DurhamScott, Charlene, MD  Admit date: 06/17/2021 Discharge date: 06/20/2021  Admitted From: assisted living Disposition:  assisted living  Recommendations for Outpatient Follow-up:  Follow up with PCP and orthopedics in 1-2 weeks Please obtain labs/tests: none Please follow up on the following pending results: none  Home Health: n/a  Equipment/Devices: n/a  Discharge Condition: good  CODE STATUS: FULL  Diet recommendation:  Diet Orders (From admission, onward)     Start     Ordered   06/19/21 0000  Diet - low sodium heart healthy        06/19/21 1728   06/17/21 1650  Diet Heart Room service appropriate? Yes; Fluid consistency: Thin  Diet effective now       Question Answer Comment  Room service appropriate? Yes   Fluid consistency: Thin   Na restriction, if any: 2 gm Na      06/17/21 1649             Brief/Interim Summary: Pt has infection with cellulitis in the left middle finger.  X-ray showed possible tiny avulsion fracture.  No need for surgical intervention per orthopedics. Patient was placed on Rocephin after initial dose of vancomycin was given in the emergency room.  Patient also has positive COVID, she was delirious and agitate, Seroquel was added which seemed to help. Awaiting her assisted living facility to accept her back - this is only barrier to discharge at this time. Was finally accepted back 06/20/21 and is well for discharge   Consultants:  Orthopedics   Procedures:  none      Discharge Diagnoses: Principal Problem:   Finger infection Active Problems:   Hypertension, essential   Dementia without behavioral disturbance (HCC)   Hypothyroidism   CAD (coronary artery disease)   Diabetes mellitus with circulatory complication (HCC)   COVID-19 virus infection   Chronic kidney disease, stage 3a (HCC)   Nondisplaced fracture of distal phalanx of left middle  finger, initial encounter for closed fracture   Senile dementia, delirium, with behavioral disturbance (HCC)    Assessment & Plan: Finger infection Pt has infection with cellulitis in the left middle finger.  X-ray showed possible tiny avulsion fracture.  Dr. Allena KatzPatel for Ortho is consulted. Per Dr. Allena KatzPatel, no significant concern for flexor tenosynovitis at this time. No plan for surgical intervention at this time.  he recommend nonweightbearing with the left hand and placing in a splint.  Patient does not have fever or leukocytosis.  Does not meet criteria for sepsis. Pt intolerant of splint and keeps removing this. Will trial at least coban wrap or buddy tape to adjacent finger but ideally this should be splinted. Rocephin transitioned to keflex po    CAD (coronary artery disease) No CP. Continue ASA   Chronic kidney disease, stage 3a (HCC) Stable no acute issues    COVID-19 virus infection Patient reportedly had positive COVID test at least 5 days ago in facility.  Currently pt is asymptomatic.  Oxygen saturation 96% on room air.  No antiviral treatment needed. Supportive care As needed albuterol and Mucinex if she develops symptoms. Have been waiting for facility to accept patient back - accepted 06/20/21    Dementia without behavioral disturbance (HCC) continue home Namenda, donepezil. Minimize Xanax. Added seroquel qhs   Diabetes mellitus with circulatory complication (HCC) Diet-controlled diabetes.  Recent A1c 6.0.  Well-controlled.  Patient's not taking medications currently.  Blood sugar 101  Hypertension,  essential Resume home lisinopril, metoprolol  Hypothyroidism Continue home Synthroid 75 mcg daily  Nondisplaced fracture of distal phalanx of left middle finger, initial encounter for closed fracture  X-ray showed possible tiny avulsion fracture.  Dr. [name] for Ortho: no significant concern for flexor tenosynovitis at this time. No plan for surgical intervention at this  time.  he recommend nonweightbearing with the left hand and placing in a splint.  Pt intolerant of splint and keeps removing this. Will trial at least coban wrap or buddy tape to adjacent finger but ideally this should be splinted.      Discharge Instructions  Discharge Instructions     Call MD for:  redness, tenderness, or signs of infection (pain, swelling, redness, odor or green/yellow discharge around incision site)   Complete by: As directed    Call MD for:  severe uncontrolled pain   Complete by: As directed    Call MD for:  temperature >100.4   Complete by: As directed    Diet - low sodium heart healthy   Complete by: As directed    Discharge instructions   Complete by: As directed    Antibiotics as directed to complete course for finger infection  Pain medication as needed for finger pain Keep finger clean, wrapped Seroquel (sleep aid, helps agitation) ok to continue but can stop this if it causes significant drowsiness   Increase activity slowly   Complete by: As directed       Allergies as of 06/20/2021       Reactions   Atorvastatin Other (See Comments)   Muscle Pain   Sulfa Antibiotics         Medication List     TAKE these medications    ALPRAZolam 0.5 MG tablet Commonly known as: XANAX Take 0.5 mg by mouth 2 (two) times daily as needed for anxiety (PRN for sundowning days).   aspirin 81 MG chewable tablet Chew 1 tablet (81 mg total) by mouth daily.   cephALEXin 500 MG capsule Commonly known as: KEFLEX Take 1 capsule (500 mg total) by mouth every 8 (eight) hours for 5 days.   donepezil 10 MG tablet Commonly known as: ARICEPT TAKE ONE TABLET AT BEDTIME.   levothyroxine 75 MCG tablet Commonly known as: SYNTHROID TAKE ONE-HALF TABLET EVERY DAY What changed: See the new instructions.   lisinopril 20 MG tablet Commonly known as: ZESTRIL TAKE (1) TABLET BY MOUTH EVERY DAY   LORazepam 0.5 MG tablet Commonly known as: ATIVAN Take 0.5 mg by  mouth 2 (two) times daily.   memantine 5 MG tablet Commonly known as: NAMENDA Take 2 tablets by mouth at bedtime.   metoprolol tartrate 25 MG tablet Commonly known as: LOPRESSOR TAKE (1) TABLET BY MOUTH TWICE DAILY   oxyCODONE-acetaminophen 5-325 MG tablet Commonly known as: PERCOCET/ROXICET Take 1 tablet by mouth every 6 (six) hours as needed for moderate pain or severe pain.   QUEtiapine 25 MG tablet Commonly known as: SEROQUEL Take 1 tablet (25 mg total) by mouth at bedtime.   SYRINGE 3CC/25GX1" 25G X 1" 3 ML Misc Use as directed to administer b12 injections.       Diet Orders (From admission, onward)     Start     Ordered   06/19/21 0000  Diet - low sodium heart healthy        06/19/21 1728   06/17/21 1650  Diet Heart Room service appropriate? Yes; Fluid consistency: Thin  Diet effective now  Question Answer Comment  Room service appropriate? Yes   Fluid consistency: Thin   Na restriction, if any: 2 gm Na      06/17/21 1649              Contact information for after-discharge care     Destination     [facility] .   Service: Group Home Contact information: [address] [place] [address] [phone]                    Allergies  Allergen Reactions   Atorvastatin Other (See Comments)    Muscle Pain   Sulfa Antibiotics      Subjective: Pt has no complaints, husband is at bedside reports she is about at her baseline. Per RN patient is not keeping splint on her finger.    Discharge Exam: Vitals:   06/19/21 2333 06/20/21 0316  BP: (!) 97/48 (!) 116/53  Pulse: (!) 59 61  Resp: 16 15  Temp: 98.2 F (36.8 C) 98.3 F (36.8 C)  SpO2: 96% 96%   Vitals:   06/19/21 1721 06/19/21 1959 06/19/21 2333 06/20/21 0316  BP: 117/75 (!) 129/54 (!) 97/48 (!) 116/53  Pulse: 81 81 (!) 59 61  Resp: Temp: 98.3 F (36.8 C) 97.6 F (36.4 C) 98.2 F (36.8 C) 98.3 F (36.8 C)  TempSrc:  Oral Oral Oral   SpO2: 99% 97% 96% 96%  Weight:      Height:        General: Pt is alert, awake, not in acute distress Cardiovascular: RRR, S1/S2 +, no rubs, no gallops Respiratory: CTA bilaterally, no wheezing, no rhonchi Abdominal: Soft, NT, ND, bowel sounds + Extremities: no edema, no cyanosis. Ecchymosis and serous drainage L middle finger, normal temperature and not tender, blister from yesterday appears to have drained, pt is not keeping bandage in place despite best efforts from nusing       The results of significant diagnostics from this hospitalization (including imaging, microbiology, ancillary and laboratory) are listed below for reference.     Microbiology: Recent Results (from the past 240 hour(s))  Resp Panel by RT-PCR (Flu A&B, Covid) Nasopharyngeal Swab     Status: Abnormal   Collection Time: 06/17/21  4:58 PM   Specimen: Nasopharyngeal Swab; Nasopharyngeal(NP) swabs in vial transport medium  Result Value Ref Range Status   SARS Coronavirus 2 by RT PCR POSITIVE (A) NEGATIVE Final    Comment: (NOTE) SARS-CoV-2 target nucleic acids are DETECTED.  The SARS-CoV-2 RNA is generally detectable in upper respiratory specimens during the acute phase of infection. Positive results are indicative of the presence of the identified virus, but do not rule out bacterial infection or co-infection with other pathogens not detected by the test. Clinical correlation with patient history and other diagnostic information is necessary to determine patient infection status. The expected result is Negative.  Fact Sheet for Patients: BloggerCourse.com  Fact Sheet for Healthcare Providers: SeriousBroker.it  This test is not yet approved or cleared by the [place] FDA and  has been authorized for detection and/or diagnosis of SARS-CoV-2 by FDA under an Emergency Use Authorization (EUA).  This EUA will remain in effect (meaning  this test can be used) for the duration of  the COVID-19 declaration under Section 564(b)(1) of the A ct, 21 U.S.C. section 360bbb-3(b)(1), unless the authorization is terminated or revoked sooner.     Influenza A by PCR NEGATIVE  NEGATIVE Final   Influenza B by PCR NEGATIVE NEGATIVE Final    Comment: (NOTE) The Xpert Xpress SARS-CoV-2/FLU/RSV plus assay is intended as an aid in the diagnosis of influenza from Nasopharyngeal swab specimens and should not be used as a sole basis for treatment. Nasal washings and aspirates are unacceptable for Xpert Xpress SARS-CoV-2/FLU/RSV testing.  Fact Sheet for Patients: BloggerCourse.com  Fact Sheet for Healthcare Providers: SeriousBroker.it  This test is not yet approved or cleared by the [place] FDA and has been authorized for detection and/or diagnosis of SARS-CoV-2 by FDA under an Emergency Use Authorization (EUA). This EUA will remain in effect (meaning this test can be used) for the duration of the COVID-19 declaration under Section 564(b)(1) of the Act, 21 U.S.C. section 360bbb-3(b)(1), unless the authorization is terminated or revoked.  Performed at [facility], [address] [place], [place] [address]   Culture, blood (Routine X 2) w Reflex to ID Panel     Status: None (Preliminary result)   Collection Time: 06/17/21 10:38 PM   Specimen: BLOOD  Result Value Ref Range Status   Specimen Description BLOOD LEFT HAND  Final   Special Requests   Final    BOTTLES DRAWN AEROBIC AND ANAEROBIC Blood Culture results may not be optimal due to an excessive volume of blood received in culture bottles   Culture   Final    NO GROWTH 3 DAYS Performed at [facility], [address], [place], [place] [address]    Report Status PENDING  Incomplete  Culture, blood (Routine X 2) w Reflex to ID Panel     Status: None (Preliminary result)   Collection Time: 06/17/21 10:38  PM   Specimen: BLOOD  Result Value Ref Range Status   Specimen Description BLOOD LEFT ASSIST CONTROL  Final   Special Requests IN PEDIATRIC BOTTLE Blood Culture adequate volume  Final   Culture   Final    NO GROWTH 3 DAYS Performed at [facility], [address] [place], [place] [address]    Report Status PENDING  Incomplete     Labs: BNP (last 3 results) No results for input(s): BNP in the last 8760 hours. Basic Metabolic Panel: Recent Labs  Lab 06/17/21 1448  NA 139  K 4.0  CL 103  CO2 27  GLUCOSE 101*  BUN 18  CREATININE 1.00  CALCIUM 9.3   Liver Function Tests: No results for input(s): AST, ALT, ALKPHOS, BILITOT, PROT, ALBUMIN in the last 168 hours. No results for input(s): LIPASE, AMYLASE in the last 168 hours. No results for input(s): AMMONIA in the last 168 hours. CBC: Recent Labs  Lab 06/17/21 1448 06/18/21 0343 06/19/21 0926  WBC 8.8 14.6* 10.4  NEUTROABS 6.2  --   --   HGB 13.3 13.0 12.8  HCT 43.0 41.2 40.0  MCV 87.2 85.3 85.7  PLT 287 301 281   Cardiac Enzymes: No results for input(s): CKTOTAL, CKMB, CKMBINDEX, TROPONINI in the last 168 hours. BNP: Invalid input(s): POCBNP CBG: Recent Labs  Lab 06/19/21 0618 06/19/21 0738  GLUCAP 97 99   D-Dimer No results for input(s): DDIMER in the last 72 hours. Hgb A1c No results for input(s): HGBA1C in the last 72 hours. Lipid Profile No results for input(s): CHOL, HDL, LDLCALC, TRIG, CHOLHDL, LDLDIRECT in the last 72 hours. Thyroid function studies No results for input(s): TSH, T4TOTAL, T3FREE, THYROIDAB in the last 72 hours.  Invalid input(s): FREET3 Anemia work up No results for input(s): VITAMINB12, FOLATE,  FERRITIN, TIBC, IRON, RETICCTPCT in the last 72 hours. Urinalysis    Component Value Date/Time   COLORURINE YELLOW (A) 10/30/2020 0242   APPEARANCEUR HAZY (A) 10/30/2020 0242   APPEARANCEUR Hazy 11/04/2011 0934   LABSPEC 1.018 10/30/2020 0242   LABSPEC 1.010 11/04/2011 0934    PHURINE 9.0 (H) 10/30/2020 0242   GLUCOSEU NEGATIVE 10/30/2020 0242   GLUCOSEU Negative 11/04/2011 0934   HGBUR NEGATIVE 10/30/2020 0242   BILIRUBINUR NEGATIVE 10/30/2020 0242   BILIRUBINUR neg 11/11/2011 1150   BILIRUBINUR Negative 11/04/2011 0934   KETONESUR 5 (A) 10/30/2020 0242   PROTEINUR 30 (A) 10/30/2020 0242   UROBILINOGEN 0.2 11/11/2011 1150   NITRITE NEGATIVE 10/30/2020 0242   LEUKOCYTESUR NEGATIVE 10/30/2020 0242   LEUKOCYTESUR Negative 11/04/2011 0934   Sepsis Labs Invalid input(s): PROCALCITONIN,  WBC,  LACTICIDVEN Microbiology Recent Results (from the past 240 hour(s))  Resp Panel by RT-PCR (Flu A&B, Covid) Nasopharyngeal Swab     Status: Abnormal   Collection Time: 06/17/21  4:58 PM   Specimen: Nasopharyngeal Swab; Nasopharyngeal(NP) swabs in vial transport medium  Result Value Ref Range Status   SARS Coronavirus 2 by RT PCR POSITIVE (A) NEGATIVE Final    Comment: (NOTE) SARS-CoV-2 target nucleic acids are DETECTED.  The SARS-CoV-2 RNA is generally detectable in upper respiratory specimens during the acute phase of infection. Positive results are indicative of the presence of the identified virus, but do not rule out bacterial infection or co-infection with other pathogens not detected by the test. Clinical correlation with patient history and other diagnostic information is necessary to determine patient infection status. The expected result is Negative.  Fact Sheet for Patients: BloggerCourse.com  Fact Sheet for Healthcare Providers: SeriousBroker.it  This test is not yet approved or cleared by the [place] FDA and  has been authorized for detection and/or diagnosis of SARS-CoV-2 by FDA under an Emergency Use Authorization (EUA).  This EUA will remain in effect (meaning this test can be used) for the duration of  the COVID-19 declaration under Section 564(b)(1) of the A ct, 21 U.S.C. section  360bbb-3(b)(1), unless the authorization is terminated or revoked sooner.     Influenza A by PCR NEGATIVE NEGATIVE Final   Influenza B by PCR NEGATIVE NEGATIVE Final    Comment: (NOTE) The Xpert Xpress SARS-CoV-2/FLU/RSV plus assay is intended as an aid in the diagnosis of influenza from Nasopharyngeal swab specimens and should not be used as a sole basis for treatment. Nasal washings and aspirates are unacceptable for Xpert Xpress SARS-CoV-2/FLU/RSV testing.  Fact Sheet for Patients: BloggerCourse.com  Fact Sheet for Healthcare Providers: SeriousBroker.it  This test is not yet approved or cleared by the [place] FDA and has been authorized for detection and/or diagnosis of SARS-CoV-2 by FDA under an Emergency Use Authorization (EUA). This EUA will remain in effect (meaning this test can be used) for the duration of the COVID-19 declaration under Section 564(b)(1) of the Act, 21 U.S.C. section 360bbb-3(b)(1), unless the authorization is terminated or revoked.  Performed at [facility], [address] [place], [place] [address]   Culture, blood (Routine X 2) w Reflex to ID Panel     Status: None (Preliminary result)   Collection Time: 06/17/21 10:38 PM   Specimen: BLOOD  Result Value Ref Range Status   Specimen Description BLOOD LEFT HAND  Final   Special Requests   Final    BOTTLES DRAWN AEROBIC AND ANAEROBIC Blood Culture results may not be optimal due to an excessive volume  of blood received in culture bottles   Culture   Final    NO GROWTH 3 DAYS Performed at [facility], [address] [place], [place] [address]    Report Status PENDING  Incomplete  Culture, blood (Routine X 2) w Reflex to ID Panel     Status: None (Preliminary result)   Collection Time: 06/17/21 10:38 PM   Specimen: BLOOD  Result Value Ref Range Status   Specimen Description BLOOD LEFT ASSIST CONTROL  Final   Special  Requests IN PEDIATRIC BOTTLE Blood Culture adequate volume  Final   Culture   Final    NO GROWTH 3 DAYS Performed at [facility], [address] [place], [place] [address]    Report Status PENDING  Incomplete   Imaging DG Hand Complete Left  Result Date: 06/17/2021 CLINICAL DATA:  Middle finger injury. EXAM: LEFT HAND - COMPLETE 3+ VIEW COMPARISON:  None Available. FINDINGS: Cortical irregularity at the volar base of the third distal phalanx. No dislocation. Mild osteoarthritis of the first [facility] joint and first IP joint. Asymmetric soft tissue swelling of the distal long finger. No radiopaque foreign body identified. IMPRESSION: 1. Asymmetric soft tissue swelling of the distal long finger. Cortical irregularity at the volar base of the third distal phalanx may reflect a tiny avulsion fracture. Electronically Signed   By: Obie Dredge M.D.   On: 06/17/2021 13:41      Time coordinating discharge: Over 30 minutes  SIGNED:  Sunnie Nielsen DO [facility]

## 2021-06-20 NOTE — TOC Progression Note (Signed)
Transition of Care [facility]) - Progression Note    Patient Details  Name: Amanda [name] MRN: [id number] Date of Birth: 1933/02/28  [facility] CM/SW Contact  Marlowe Sax, RN Phone Number: 06/20/2021, 9:40 AM  Clinical Narrative:    Cervante from [facility] called back and stated that they can accept the patient back, her husband is on his way here and he can transport, if any reason he can not rransport then [facility] can pick her up at 1 PM   Expected Discharge Plan: Assisted Living Barriers to Discharge: Barriers Resolved  Expected Discharge Plan and Services Expected Discharge Plan: Assisted Living       Living arrangements for the past 2 months: Assisted Living Facility Expected Discharge Date: 06/19/21                                     Social Determinants of Health (SDOH) Interventions    Readmission Risk Interventions     View : No data to display.

## 2021-06-22 LAB — CULTURE, BLOOD (ROUTINE X 2)
Culture: NO GROWTH
Culture: NO GROWTH
Special Requests: ADEQUATE

## 2021-07-04 ENCOUNTER — Other Ambulatory Visit (HOSPITAL_BASED_OUTPATIENT_CLINIC_OR_DEPARTMENT_OTHER): Payer: Self-pay | Admitting: Osteopathic Medicine

## 2021-07-05 ENCOUNTER — Other Ambulatory Visit: Payer: Self-pay

## 2021-07-05 MED ORDER — LORAZEPAM 0.5 MG PO TABS: ORAL_TABLET | ORAL | 0 refills | Status: DC

## 2021-07-05 NOTE — Telephone Encounter (Signed)
Please clarify if has been taking lorazepam and if seeing physician at facility.  See previous note.

## 2021-07-05 NOTE — Telephone Encounter (Signed)
Rx sent in for lorazepam. - to take 1/2 table a day prn.  Let us know if needing on a regular basis.

## 2021-07-05 NOTE — Telephone Encounter (Signed)
I do not see where I have ever refilled this medication.  She is currently living in an assisted living facility.  Please call facility and confirm if I am still PCP.  Also confirm when lorazepam prescribed and who prescribed.  Need more information.

## 2021-07-06 ENCOUNTER — Telehealth: Payer: Self-pay | Admitting: Internal Medicine

## 2021-07-06 MED ORDER — LORAZEPAM 0.5 MG PO TABS: ORAL_TABLET | ORAL | 0 refills | Status: DC

## 2021-07-06 NOTE — Telephone Encounter (Signed)
Spoke to [facility] reviewed.  Pt currently taking lorazepam .5mg  bid.  (Most days).  Explained will send in rx for lorazepam .5mg  to take 1 tablet in am and 1/2 in pm (given pt sleeping well). Need to d/c xanax.  Please call and give order to d/c xanax.  Also, please call [facility] and cancel lorazepam prescription.  Confirmed rx needed to go to .

## 2021-07-08 NOTE — Telephone Encounter (Signed)
I called and spoke with someone at [facility] and they gave me the fax number to fax the discontinuation of the Xanax, this was faxed to [phone] today, confirmation given and I also called [facility] and cancelled the Rx for lorazepam and it was cancelled with the pharmacist.  Talbert Nan

## 2021-07-10 DIAGNOSIS — I700 Atherosclerosis of aorta: Secondary | ICD-10-CM | POA: Diagnosis not present

## 2021-07-10 DIAGNOSIS — E782 Mixed hyperlipidemia: Secondary | ICD-10-CM | POA: Diagnosis not present

## 2021-07-10 DIAGNOSIS — E119 Type 2 diabetes mellitus without complications: Secondary | ICD-10-CM | POA: Diagnosis not present

## 2021-07-10 DIAGNOSIS — I2510 Atherosclerotic heart disease of native coronary artery without angina pectoris: Secondary | ICD-10-CM | POA: Diagnosis not present

## 2021-07-10 DIAGNOSIS — I739 Peripheral vascular disease, unspecified: Secondary | ICD-10-CM | POA: Diagnosis not present

## 2021-07-10 DIAGNOSIS — I10 Essential (primary) hypertension: Secondary | ICD-10-CM | POA: Diagnosis not present

## 2021-08-20 ENCOUNTER — Other Ambulatory Visit: Payer: Self-pay

## 2021-08-20 NOTE — Telephone Encounter (Signed)
Please clarify with [facility] if getting lorazepam scheduled one in am and 1/2 in pm or is this prn

## 2021-08-21 MED ORDER — LORAZEPAM 0.5 MG PO TABS: ORAL_TABLET | ORAL | 0 refills | Status: AC

## 2021-08-21 NOTE — Telephone Encounter (Signed)
Rx ok'd for lorazepam #45 with no refills.

## 2021-09-15 ENCOUNTER — Other Ambulatory Visit: Payer: Self-pay

## 2021-09-15 ENCOUNTER — Emergency Department
Admission: EM | Admit: 2021-09-15 | Discharge: 2021-09-16 | Disposition: A | Discharge status: Skilled Nursing Facility | Payer: Medicare PPO | Attending: Emergency Medicine | Admitting: Emergency Medicine

## 2021-09-15 DIAGNOSIS — N1831 Chronic kidney disease, stage 3a: Secondary | ICD-10-CM | POA: Diagnosis not present

## 2021-09-15 DIAGNOSIS — Z79899 Other long term (current) drug therapy: Secondary | ICD-10-CM | POA: Insufficient documentation

## 2021-09-15 DIAGNOSIS — E039 Hypothyroidism, unspecified: Secondary | ICD-10-CM | POA: Insufficient documentation

## 2021-09-15 DIAGNOSIS — I129 Hypertensive chronic kidney disease with stage 1 through stage 4 chronic kidney disease, or unspecified chronic kidney disease: Secondary | ICD-10-CM | POA: Diagnosis not present

## 2021-09-15 DIAGNOSIS — E1122 Type 2 diabetes mellitus with diabetic chronic kidney disease: Secondary | ICD-10-CM | POA: Insufficient documentation

## 2021-09-15 DIAGNOSIS — M47816 Spondylosis without myelopathy or radiculopathy, lumbar region: Secondary | ICD-10-CM | POA: Diagnosis not present

## 2021-09-15 DIAGNOSIS — F0390 Unspecified dementia without behavioral disturbance: Secondary | ICD-10-CM | POA: Diagnosis not present

## 2021-09-15 DIAGNOSIS — Z7982 Long term (current) use of aspirin: Secondary | ICD-10-CM | POA: Insufficient documentation

## 2021-09-15 DIAGNOSIS — M549 Dorsalgia, unspecified: Secondary | ICD-10-CM | POA: Diagnosis not present

## 2021-09-15 DIAGNOSIS — I2510 Atherosclerotic heart disease of native coronary artery without angina pectoris: Secondary | ICD-10-CM | POA: Insufficient documentation

## 2021-09-15 DIAGNOSIS — M545 Low back pain, unspecified: Secondary | ICD-10-CM | POA: Diagnosis not present

## 2021-09-15 DIAGNOSIS — I959 Hypotension, unspecified: Secondary | ICD-10-CM | POA: Diagnosis not present

## 2021-09-15 DIAGNOSIS — R079 Chest pain, unspecified: Secondary | ICD-10-CM | POA: Diagnosis not present

## 2021-09-15 DIAGNOSIS — N390 Urinary tract infection, site not specified: Secondary | ICD-10-CM | POA: Diagnosis not present

## 2021-09-15 DIAGNOSIS — I10 Essential (primary) hypertension: Secondary | ICD-10-CM | POA: Diagnosis not present

## 2021-09-15 LAB — CBC
HCT: 38.7 % (ref 36.0–46.0)
Hemoglobin: 11.8 g/dL — ABNORMAL LOW (ref 12.0–15.0)
MCH: 26.8 pg (ref 26.0–34.0)
MCHC: 30.5 g/dL (ref 30.0–36.0)
MCV: 88 fL (ref 80.0–100.0)
Platelets: 301 10*3/uL (ref 150–400)
RBC: 4.4 MIL/uL (ref 3.87–5.11)
RDW: 14 % (ref 11.5–15.5)
WBC: 8.2 10*3/uL (ref 4.0–10.5)
nRBC: 0 % (ref 0.0–0.2)

## 2021-09-15 LAB — COMPREHENSIVE METABOLIC PANEL
ALT: 14 U/L (ref 0–44)
AST: 17 U/L (ref 15–41)
Albumin: 3.4 g/dL — ABNORMAL LOW (ref 3.5–5.0)
Alkaline Phosphatase: 82 U/L (ref 38–126)
Anion gap: 6 (ref 5–15)
BUN: 31 mg/dL — ABNORMAL HIGH (ref 8–23)
CO2: 27 mmol/L (ref 22–32)
Calcium: 8.9 mg/dL (ref 8.9–10.3)
Chloride: 107 mmol/L (ref 98–111)
Creatinine, Ser: 1.17 mg/dL — ABNORMAL HIGH (ref 0.44–1.00)
GFR, Estimated: 45 mL/min — ABNORMAL LOW (ref >60)
Glucose, Bld: 152 mg/dL — ABNORMAL HIGH (ref 70–99)
Potassium: 4.3 mmol/L (ref 3.5–5.1)
Sodium: 140 mmol/L (ref 135–145)
Total Bilirubin: 0.4 mg/dL (ref 0.3–1.2)
Total Protein: 6.9 g/dL (ref 6.5–8.1)

## 2021-09-15 NOTE — ED Provider Notes (Incomplete)
[facility] Provider Note    Event Date/Time   First MD Initiated Contact with Patient 09/15/21 2318     (approximate)   History   Back Pain   HPI  Level V caveat: Limited by dementia  Amanda [name] is a 88 y.o. female brought to the ED via EMS from [facility] dementia unit with a chief complaint of back pain.  Staff reported to EMS back pain x24 hours.  Patient was given pain medicine at 5 PM.  Reportedly patient's family woke her up from sleep to have her brought to the hospital.  Patient denies complaints currently.  Does not remember experiencing back pain.  Denies fever, cough, chest pain, shortness of breath, abdominal/flank pain, nausea, vomiting, dysuria.  Denies fall/injury/trauma.  Reports she has been ambulating fine.     Past Medical History   Past Medical History:  Diagnosis Date  . Carotid bruit    bilaterally  . Dementia (HCC)   . Hypercholesterolemia   . Hypertension   . Hypothyroidism      Active Problem List   Patient Active Problem List   Diagnosis Date Noted  . Nondisplaced fracture of distal phalanx of left middle finger, initial encounter for closed fracture 06/18/2021  . Senile dementia, delirium, with behavioral disturbance (HCC) 06/18/2021  . Finger infection 06/17/2021  . COVID-19 virus infection   . Chronic kidney disease, stage 3a (HCC)   . Aortic atherosclerosis (HCC) 08/03/2020  . Renal cyst 02/01/2019  . Anemia 02/01/2019  . Acute ST elevation myocardial infarction (STEMI) of inferior wall (HCC)   . STEMI involving right coronary artery (HCC) 01/21/2019  . Unstable angina pectoris (HCC) 01/19/2019  . Abdominal bruit 09/14/2017  . Muscle ache 03/06/2017  . Fall 12/31/2014  . Health care maintenance 05/20/2014  . Diabetes mellitus with circulatory complication (HCC) 05/17/2013  . CAD (coronary artery disease) 05/15/2012  . Osteopenia 12/14/2011  . Hypertension, essential 11/13/2011  . Hypercholesteremia  11/13/2011  . Dementia without behavioral disturbance (HCC) 11/13/2011  . Hypothyroidism 11/13/2011     Past Surgical History   Past Surgical History:  Procedure Laterality Date  . CARPAL TUNNEL RELEASE    . CHOLECYSTECTOMY  1991  . CORONARY/GRAFT ACUTE MI REVASCULARIZATION N/A 01/21/2019   Procedure: Coronary/Graft Acute MI Revascularization;  Surgeon: Alwyn Pea, MD;  Location: [facility] INVASIVE CV LAB;  Service: Cardiovascular;  Laterality: N/A;  . LEFT HEART CATH AND CORONARY ANGIOGRAPHY N/A 01/21/2019   Procedure: LEFT HEART CATH AND CORONARY ANGIOGRAPHY;  Surgeon: Alwyn Pea, MD;  Location: [facility] INVASIVE CV LAB;  Service: Cardiovascular;  Laterality: N/A;  . THORACIC AORTA STENT  12/13   heart stent     Home Medications   Prior to Admission medications   Medication Sig Start Date End Date Taking? Authorizing Provider  aspirin 81 MG chewable tablet Chew 1 tablet (81 mg total) by mouth daily. 01/25/19   Lynn Ito, MD  donepezil (ARICEPT) 10 MG tablet TAKE ONE TABLET AT BEDTIME. 04/30/20   Dale [place], MD  levothyroxine (SYNTHROID) 75 MCG tablet TAKE ONE-HALF TABLET EVERY DAY Patient taking differently: Take 75 mcg by mouth daily before breakfast. 04/16/20   Dale Webb, MD  lisinopril (ZESTRIL) 20 MG tablet TAKE (1) TABLET BY MOUTH EVERY DAY 01/07/21   Dale Oakley, MD  LORazepam (ATIVAN) 0.5 MG tablet 1 tablet in the am and 1/2 tablet q pm. 08/21/21   Dale Merrimac, MD  memantine (NAMENDA) 5 MG tablet Take 2 tablets  by mouth at bedtime. 09/26/20 09/26/21  [provider]  metoprolol tartrate (LOPRESSOR) 25 MG tablet TAKE (1) TABLET BY MOUTH TWICE DAILY 02/11/21   Dale Murray, MD  oxyCODONE-acetaminophen (PERCOCET/ROXICET) 5-325 MG tablet Take 1 tablet by mouth every 6 (six) hours as needed for moderate pain or severe pain. 06/19/21   Sunnie Nielsen, DO  QUEtiapine (SEROQUEL) 25 MG tablet Take 1 tablet (25 mg total) by mouth at bedtime.  06/19/21   Sunnie Nielsen, DO  Syringe/Needle, Disp, (SYRINGE 3CC/25GX1") 25G X 1" 3 ML MISC Use as directed to administer b12 injections. 06/07/19   Dale [place], MD     Allergies  Atorvastatin and Sulfa antibiotics   Family History   Family History  Problem Relation Age of Onset  . Hypertension Father   . Lung cancer Father   . Hypertension Mother   . Dementia Mother   . Arthritis Other   . Breast cancer Cousin        mat cousin  . Colon cancer Neg Hx      Physical Exam  Triage Vital Signs: ED Triage Vitals  Enc Vitals Group     BP 09/15/21 2210 123/60     Pulse Rate 09/15/21 2210 69     Resp 09/15/21 2210 16     Temp 09/15/21 2210 98 F (36.7 C)     Temp Source 09/15/21 2210 Oral     SpO2 09/15/21 2210 94 %     Weight 09/15/21 2211 149 lb 14.6 oz (68 kg)     Height 09/15/21 2211 5\' 3"  (1.6 m)     Head Circumference --      Peak Flow --      Pain Score 09/15/21 2210 0     Pain Loc --      Pain Edu? --      Excl. in GC? --     Updated Vital Signs: BP 123/60   Pulse 69   Temp 98 F (36.7 C) (Oral)   Resp 16   Ht 5\' 3"  (1.6 m)   Wt 68 kg   SpO2 94%   BMI 26.56 kg/m    General: Awake, no distress.  CV:  RRR.  Good peripheral perfusion.  Resp:  Normal effort.  CTA B. Abd:  Nontender.  No CVAT.  No truncal vesicles.  No distention.  Other:  No spinal tenderness to palpation.  Pelvis is stable.  Patient reports right buttock pain on lifting her right leg.  Negative straight leg raise.  Full range of motion right hip without pain.  Alert and oriented to person and place.  CN II toXII grossly intact.  5/5 motor strength and sensation all extremities.  MAEx4.  2+ distal pulses.  Brisk, less than 5-second capillary refill.   ED Results / Procedures / Treatments  Labs (all labs ordered are listed, but only abnormal results are displayed) Labs Reviewed  COMPREHENSIVE METABOLIC PANEL - Abnormal; Notable for the following components:      Result Value    Glucose, Bld 152 (*)    BUN 31 (*)    Creatinine, Ser 1.17 (*)    Albumin 3.4 (*)    GFR, Estimated 45 (*)    All other components within normal limits  CBC - Abnormal; Notable for the following components:   Hemoglobin 11.8 (*)    All other components within normal limits  URINALYSIS, ROUTINE W REFLEX MICROSCOPIC     EKG  ED ECG REPORT I, Bolivar Koranda J, the  attending physician, personally viewed and interpreted this ECG.   Date: 09/16/2021  EKG Time: 2221  Rate: 65  Rhythm: normal sinus rhythm  Axis: ***  Intervals:{conduction defects:17367}  ST&T Change: ***    RADIOLOGY *** {You MUST document your own interpretation of imaging, as well as the fact that you reviewed the radiologist's report!:1}  Official radiology report(s): No results found.   PROCEDURES:  Critical Care performed: {CriticalCareYesNo:19197::"Yes, see critical care procedure note(s)","No"}  Procedures   MEDICATIONS ORDERED IN ED: Medications - No data to display   IMPRESSION / MDM / ASSESSMENT AND PLAN / ED COURSE  I reviewed the triage vital signs and the nursing notes.                              Differential diagnosis includes, but is not limited to, ***  Patient's presentation is most consistent with {EM COPA:27473}  {If the patient is on the monitor, remove the brackets and asterisks on the sentence below and remember to document it as a Procedure as well. Otherwise delete the sentence below:1} {**The patient is on the cardiac monitor to evaluate for evidence of arrhythmia and/or significant heart rate changes.**}  {Remember to include, when applicable, any/all of the following data: independent review of imaging independent review of labs (comment specifically on pertinent positives and negatives) review of specific prior hospitalizations, PCP/specialist notes, etc. discuss meds given and prescribed document any discussion with consultants (including hospitalists) any clinical  decision tools you used and why (PECARN, NEXUS, etc.) did you consider admitting the patient? document social determinants of health affecting patient's care (homelessness, inability to follow up in a timely fashion, etc) document any pre-existing conditions increasing risk on current visit (e.g. diabetes and HTN increasing danger of high-risk chest pain/ACS) describes what meds you gave (especially parenteral) and why any other interventions?:1}      FINAL CLINICAL IMPRESSION(S) / ED DIAGNOSES   Final diagnoses:  None     Rx / DC Orders   ED Discharge Orders     None        Note:  This document was prepared using Dragon voice recognition software and may include unintentional dictation errors.

## 2021-09-15 NOTE — ED Notes (Signed)
Patient alert, oriented to name only. Currently denies pain. No distress noted.

## 2021-09-15 NOTE — ED Provider Notes (Signed)
[facility] Provider Note    Event Date/Time   First MD Initiated Contact with Patient 09/15/21 2318     (approximate)   History   Back Pain   HPI  Level V caveat: Limited by dementia  Amanda [name] is a 88 y.o. female brought to the ED via EMS from [facility] dementia unit with a chief complaint of back pain.  Staff reported to EMS back pain x24 hours.  Patient was given pain medicine at 5 PM.  Reportedly patient's family woke her up from sleep to have her brought to the hospital.  Patient denies complaints currently.  Does not remember experiencing back pain.  Denies fever, cough, chest pain, shortness of breath, abdominal/flank pain, nausea, vomiting, dysuria.  Denies fall/injury/trauma.  Reports she has been ambulating fine.     Past Medical History   Past Medical History:  Diagnosis Date  . Carotid bruit    bilaterally  . Dementia (HCC)   . Hypercholesterolemia   . Hypertension   . Hypothyroidism      Active Problem List   Patient Active Problem List   Diagnosis Date Noted  . Nondisplaced fracture of distal phalanx of left middle finger, initial encounter for closed fracture 06/18/2021  . Senile dementia, delirium, with behavioral disturbance (HCC) 06/18/2021  . Finger infection 06/17/2021  . COVID-19 virus infection   . Chronic kidney disease, stage 3a (HCC)   . Aortic atherosclerosis (HCC) 08/03/2020  . Renal cyst 02/01/2019  . Anemia 02/01/2019  . Acute ST elevation myocardial infarction (STEMI) of inferior wall (HCC)   . STEMI involving right coronary artery (HCC) 01/21/2019  . Unstable angina pectoris (HCC) 01/19/2019  . Abdominal bruit 09/14/2017  . Muscle ache 03/06/2017  . Fall 12/31/2014  . Health care maintenance 05/20/2014  . Diabetes mellitus with circulatory complication (HCC) 05/17/2013  . CAD (coronary artery disease) 05/15/2012  . Osteopenia 12/14/2011  . Hypertension, essential 11/13/2011  . Hypercholesteremia  11/13/2011  . Dementia without behavioral disturbance (HCC) 11/13/2011  . Hypothyroidism 11/13/2011     Past Surgical History   Past Surgical History:  Procedure Laterality Date  . CARPAL TUNNEL RELEASE    . CHOLECYSTECTOMY  1991  . CORONARY/GRAFT ACUTE MI REVASCULARIZATION N/A 01/21/2019   Procedure: Coronary/Graft Acute MI Revascularization;  Surgeon: Alwyn Pea, MD;  Location: [facility] INVASIVE CV LAB;  Service: Cardiovascular;  Laterality: N/A;  . LEFT HEART CATH AND CORONARY ANGIOGRAPHY N/A 01/21/2019   Procedure: LEFT HEART CATH AND CORONARY ANGIOGRAPHY;  Surgeon: Alwyn Pea, MD;  Location: [facility] INVASIVE CV LAB;  Service: Cardiovascular;  Laterality: N/A;  . THORACIC AORTA STENT  12/13   heart stent     Home Medications   Prior to Admission medications   Medication Sig Start Date End Date Taking? Authorizing Provider  aspirin 81 MG chewable tablet Chew 1 tablet (81 mg total) by mouth daily. 01/25/19   Lynn Ito, MD  donepezil (ARICEPT) 10 MG tablet TAKE ONE TABLET AT BEDTIME. 04/30/20   Dale [place], MD  levothyroxine (SYNTHROID) 75 MCG tablet TAKE ONE-HALF TABLET EVERY DAY Patient taking differently: Take 75 mcg by mouth daily before breakfast. 04/16/20   Dale Webb, MD  lisinopril (ZESTRIL) 20 MG tablet TAKE (1) TABLET BY MOUTH EVERY DAY 01/07/21   Dale Oakley, MD  LORazepam (ATIVAN) 0.5 MG tablet 1 tablet in the am and 1/2 tablet q pm. 08/21/21   Dale Merrimac, MD  memantine (NAMENDA) 5 MG tablet Take 2 tablets  by mouth at bedtime. 09/26/20 09/26/21  [provider]  metoprolol tartrate (LOPRESSOR) 25 MG tablet TAKE (1) TABLET BY MOUTH TWICE DAILY 02/11/21   Dale Omega, MD  oxyCODONE-acetaminophen (PERCOCET/ROXICET) 5-325 MG tablet Take 1 tablet by mouth every 6 (six) hours as needed for moderate pain or severe pain. 06/19/21   Sunnie Nielsen, DO  QUEtiapine (SEROQUEL) 25 MG tablet Take 1 tablet (25 mg total) by mouth at bedtime.  06/19/21   Sunnie Nielsen, DO  Syringe/Needle, Disp, (SYRINGE 3CC/25GX1") 25G X 1" 3 ML MISC Use as directed to administer b12 injections. 06/07/19   Dale [place], MD     Allergies  Atorvastatin and Sulfa antibiotics   Family History   Family History  Problem Relation Age of Onset  . Hypertension Father   . Lung cancer Father   . Hypertension Mother   . Dementia Mother   . Arthritis Other   . Breast cancer Cousin        mat cousin  . Colon cancer Neg Hx      Physical Exam  Triage Vital Signs: ED Triage Vitals  Enc Vitals Group     BP 09/15/21 2210 123/60     Pulse Rate 09/15/21 2210 69     Resp 09/15/21 2210 16     Temp 09/15/21 2210 98 F (36.7 C)     Temp Source 09/15/21 2210 Oral     SpO2 09/15/21 2210 94 %     Weight 09/15/21 2211 149 lb 14.6 oz (68 kg)     Height 09/15/21 2211 5\' 3"  (1.6 m)     Head Circumference --      Peak Flow --      Pain Score 09/15/21 2210 0     Pain Loc --      Pain Edu? --      Excl. in GC? --     Updated Vital Signs: BP 123/60   Pulse 69   Temp 98 F (36.7 C) (Oral)   Resp 16   Ht 5\' 3"  (1.6 m)   Wt 68 kg   SpO2 94%   BMI 26.56 kg/m    General: Awake, no distress.  CV:  RRR.  Good peripheral perfusion.  Resp:  Normal effort.  CTA B. Abd:  Nontender.  No CVAT.  No truncal vesicles.  No distention.  Other:  No spinal tenderness to palpation.  Pelvis is stable.  Patient reports right buttock pain on lifting her right leg.  Negative straight leg raise.  Full range of motion right hip without pain.  Alert and oriented to person and place.  CN II toXII grossly intact.  5/5 motor strength and sensation all extremities.  MAEx4.  2+ distal pulses.  Brisk, less than 5-second capillary refill.   ED Results / Procedures / Treatments  Labs (all labs ordered are listed, but only abnormal results are displayed) Labs Reviewed  COMPREHENSIVE METABOLIC PANEL - Abnormal; Notable for the following components:      Result Value    Glucose, Bld 152 (*)    BUN 31 (*)    Creatinine, Ser 1.17 (*)    Albumin 3.4 (*)    GFR, Estimated 45 (*)    All other components within normal limits  CBC - Abnormal; Notable for the following components:   Hemoglobin 11.8 (*)    All other components within normal limits  URINALYSIS, ROUTINE W REFLEX MICROSCOPIC - Abnormal; Notable for the following components:   Color, Urine YELLOW (*)  APPearance HAZY (*)    Leukocytes,Ua TRACE (*)    Bacteria, UA RARE (*)    All other components within normal limits     EKG  ED ECG REPORT I, Shaterria Sager J, the attending physician, personally viewed and interpreted this ECG.   Date: 09/16/2021  EKG Time: 2221  Rate: 65  Rhythm: normal sinus rhythm  Axis: Normal  Intervals:none  ST&T Change: Nonspecific    RADIOLOGY I have independently visualized and interpreted patient's x-rays as well as noted the radiology interpretation:  Chest x-ray: No acute cardiopulmonary process  Right hip x-ray: No acute osseous injury  Lumbar spine x-ray: No acute osseous injury; degenerative changes  Official radiology report(s): DG Chest 2 View  Result Date: 09/16/2021 CLINICAL DATA:  Back pain EXAM: CHEST - 2 VIEW COMPARISON:  01/19/2019 FINDINGS: Heart and mediastinal contours are within normal limits. No focal opacities or effusions. No acute bony abnormality. IMPRESSION: No active cardiopulmonary disease. Electronically Signed   By: Charlett Nose M.D.   On: 09/16/2021 00:30   DG Hip Unilat With Pelvis 2-3 Views Right  Result Date: 09/16/2021 CLINICAL DATA:  Back pain EXAM: DG HIP (WITH OR WITHOUT PELVIS) 2-3V RIGHT COMPARISON:  None Available. FINDINGS: Hip joints and SI joints symmetric. No acute bony abnormality. Specifically, no fracture, subluxation, or dislocation. Degenerative changes in the visualized lower lumbar spine. IMPRESSION: No acute bony abnormality. Electronically Signed   By: Charlett Nose M.D.   On: 09/16/2021 00:30   DG Lumbar  Spine Complete  Result Date: 09/16/2021 CLINICAL DATA:  Back pain EXAM: LUMBAR SPINE - COMPLETE 4+ VIEW COMPARISON:  None Available. FINDINGS: Diffuse degenerative disc disease and facet disease, most pronounced in the lower lumbar spine. Normal alignment. No fracture. SI joints symmetric and unremarkable. IMPRESSION: Degenerative disc and facet disease.  No acute bony abnormality. Electronically Signed   By: Charlett Nose M.D.   On: 09/16/2021 00:29     PROCEDURES:  Critical Care performed: No  Procedures   MEDICATIONS ORDERED IN ED: Medications  fosfomycin (MONUROL) packet 3 g (has no administration in time range)     IMPRESSION / MDM / ASSESSMENT AND PLAN / ED COURSE  I reviewed the triage vital signs and the nursing notes.                             88 year old female who was awakened from sleep to send to the ED for back pain reported earlier.  Differential diagnosis includes but is not limited to traumatic injury, musculoskeletal etiology, infectious etiology, metabolic, etc.  I have personally reviewed patient's records and note her cardiology office visit on 07/10/2021 for follow-up hypertension and CAD.  Patient's presentation is most consistent with acute complicated illness / injury requiring diagnostic workup.  Laboratory results demonstrate normal WBC 8.2, mildly elevated creatinine 1.17 compared to 3 months ago.  Awaiting patient to provide urine specimen.  Will obtain plain film images of chest to evaluate for pneumonia, right hip and lumbar spine to evaluate for acute traumatic injury.  Patient voices no complaints of pain at present.  Will reassess.  Clinical Course as of 09/16/21 0148  Mon Sep 16, 2021  1025 Chest, right hip/pelvis and lumbar spine x-rays unremarkable for acute osseous injury.  Trace leukocytes noted in UA.  Will dose fosfomycin.  Updated patient of the above.  Strict return precautions given.  Patient verbalizes understanding and agrees with plan of  care. [JS]  Clinical Course User Index [JS] Irean Hong, MD     FINAL CLINICAL IMPRESSION(S) / ED DIAGNOSES   Final diagnoses:  Acute back pain, unspecified back location, unspecified back pain laterality  Lower urinary tract infectious disease     Rx / DC Orders   ED Discharge Orders     None        Note:  This document was prepared using Dragon voice recognition software and may include unintentional dictation errors.   Irean Hong, MD 09/16/21 [phone]

## 2021-09-15 NOTE — ED Triage Notes (Addendum)
Pt states her family woke her up to bring her to the hospital. Pt states she did have back pain earlier today, was given her prescribed medications and then awoke to family telling her she had to come to the hospital for her back pain. Pt denies complaints currently. Pt is confused. Per family pt lives at [place] and had been complaining of "side pain:.

## 2021-09-15 NOTE — ED Notes (Signed)
Call husband and/or son re dispo.   HusbandGertha Lichtenberg [phone] Grandson- [phone]

## 2021-09-15 NOTE — ED Triage Notes (Signed)
First nurse note: Pt to ED via EMS from [facility] c/o back pain.  EMS states back pain x24 hours, gave oxy at 1700.  Currently denies pain, VSS, EMS had to wake patient upon arrival to [facility].

## 2021-09-16 ENCOUNTER — Emergency Department: Payer: Medicare PPO

## 2021-09-16 DIAGNOSIS — Z743 Need for continuous supervision: Secondary | ICD-10-CM | POA: Diagnosis not present

## 2021-09-16 DIAGNOSIS — M545 Low back pain, unspecified: Secondary | ICD-10-CM | POA: Diagnosis not present

## 2021-09-16 DIAGNOSIS — R079 Chest pain, unspecified: Secondary | ICD-10-CM | POA: Diagnosis not present

## 2021-09-16 DIAGNOSIS — F0390 Unspecified dementia without behavioral disturbance: Secondary | ICD-10-CM | POA: Diagnosis not present

## 2021-09-16 DIAGNOSIS — M47816 Spondylosis without myelopathy or radiculopathy, lumbar region: Secondary | ICD-10-CM | POA: Diagnosis not present

## 2021-09-16 LAB — URINALYSIS, ROUTINE W REFLEX MICROSCOPIC
Bilirubin Urine: NEGATIVE
Glucose, UA: NEGATIVE mg/dL
Hgb urine dipstick: NEGATIVE
Ketones, ur: NEGATIVE mg/dL
Nitrite: NEGATIVE
Protein, ur: NEGATIVE mg/dL
Specific Gravity, Urine: 1.015 (ref 1.005–1.030)
pH: 5 (ref 5.0–8.0)

## 2021-09-16 MED ORDER — FOSFOMYCIN TROMETHAMINE 3 G PO PACK
3.0000 g | PACK | Freq: Once | ORAL | Status: AC
  Administered 2021-09-16: 3 g via ORAL
  Filled 2021-09-16: qty 3

## 2021-09-16 NOTE — Discharge Instructions (Signed)
Return to the ER for worsening symptoms, persistent vomiting, fever, difficulty breathing or other concerns. 

## 2021-09-16 NOTE — ED Notes (Signed)
This RN called and spoke with patient's grandson Richard regarding pt's D/C. Pt's grandson reports is unable to transport back to facility.

## 2021-09-17 LAB — URINE CULTURE

## 2021-09-19 DIAGNOSIS — M545 Low back pain, unspecified: Secondary | ICD-10-CM | POA: Diagnosis not present

## 2021-09-20 ENCOUNTER — Encounter: Payer: Self-pay | Admitting: Internal Medicine

## 2021-09-20 ENCOUNTER — Ambulatory Visit (INDEPENDENT_AMBULATORY_CARE_PROVIDER_SITE_OTHER): Payer: Medicare PPO | Admitting: Internal Medicine

## 2021-09-20 DIAGNOSIS — I2510 Atherosclerotic heart disease of native coronary artery without angina pectoris: Secondary | ICD-10-CM | POA: Diagnosis not present

## 2021-09-20 DIAGNOSIS — E1159 Type 2 diabetes mellitus with other circulatory complications: Secondary | ICD-10-CM | POA: Diagnosis not present

## 2021-09-20 DIAGNOSIS — E7800 Pure hypercholesterolemia, unspecified: Secondary | ICD-10-CM

## 2021-09-20 DIAGNOSIS — I700 Atherosclerosis of aorta: Secondary | ICD-10-CM | POA: Diagnosis not present

## 2021-09-20 DIAGNOSIS — I10 Essential (primary) hypertension: Secondary | ICD-10-CM

## 2021-09-20 DIAGNOSIS — M25551 Pain in right hip: Secondary | ICD-10-CM | POA: Diagnosis not present

## 2021-09-20 DIAGNOSIS — N1831 Chronic kidney disease, stage 3a: Secondary | ICD-10-CM | POA: Diagnosis not present

## 2021-09-20 DIAGNOSIS — M545 Low back pain, unspecified: Secondary | ICD-10-CM

## 2021-09-20 DIAGNOSIS — E039 Hypothyroidism, unspecified: Secondary | ICD-10-CM

## 2021-09-20 DIAGNOSIS — F0390 Unspecified dementia without behavioral disturbance: Secondary | ICD-10-CM

## 2021-09-20 NOTE — Progress Notes (Signed)
Patient ID: Amanda [name], female   DOB: February 14, 1933, 88 y.o.   MRN: [id number]   Subjective:    Patient ID: Amanda [name], female    DOB: Jul 22, 1933, 88 y.o.   MRN: [id number]   Patient here for  Chief Complaint  Patient presents with   Annual Exam   .   HPI Main concern is low back pain.  Went to ER 09/15/21. Xray (lumbar)- degenerative disc and facet disease and hip xray - no acute bony abnormality.  She was previously walking daily.  Has not been walking recently.  When sitting - no pain.  When she stands and tries to walk - weight bearing - increased pain.  Denies any known fall or injury.  Significant change in activity.  No chest pain.  Breathing stable.  No increased cough or congestion.  No abdominal pain.  Bowels moving.    Past Medical History:  Diagnosis Date   Carotid bruit    bilaterally   Dementia ([place])    Hypercholesterolemia    Hypertension    Hypothyroidism    Past Surgical History:  Procedure Laterality Date   CARPAL TUNNEL RELEASE     CHOLECYSTECTOMY  1991   CORONARY/GRAFT ACUTE MI REVASCULARIZATION N/A 01/21/2019   Procedure: Coronary/Graft Acute MI Revascularization;  Surgeon: Yolonda Kida, MD;  Location: [facility];  Service: Cardiovascular;  Laterality: N/A;   LEFT HEART CATH AND CORONARY ANGIOGRAPHY N/A 01/21/2019   Procedure: LEFT HEART CATH AND CORONARY ANGIOGRAPHY;  Surgeon: Yolonda Kida, MD;  Location: [facility];  Service: Cardiovascular;  Laterality: N/A;   THORACIC AORTA STENT  12/13   heart stent   Family History  Problem Relation Age of Onset   Hypertension Father    Lung cancer Father    Hypertension Mother    Dementia Mother    Arthritis Other    Breast cancer Cousin        mat cousin   Colon cancer Neg Hx    Social History   Socioeconomic History   Marital status: Married    Spouse name: Tenita Cue   Number of children: 2   Years of education: 16   Highest education level: Bachelor's degree (e.g.,  BA, AB, BS)  Occupational History   Occupation: Retired Education officer, museum  Tobacco Use   Smoking status: Never   Smokeless tobacco: Never  Vaping Use   Vaping Use: Never used  Substance and Sexual Activity   Alcohol use: No    Alcohol/week: 0.0 standard drinks of alcohol   Drug use: No   Sexual activity: Not Currently  Other Topics Concern   Not on file  Social History Narrative   She is an only child. 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   Social Determinants of Health   Financial Resource Strain: Low Risk  (06/11/2020)   Overall Financial Resource Strain (CARDIA)    Difficulty of Paying Living Expenses: Not hard at all  Food Insecurity: No Food Insecurity (06/11/2020)   Hunger Vital Sign    Worried About Running Out of Food in the Last Year: Never true    Ran Out of Food in the Last Year: Never true  Transportation Needs: No Transportation Needs (06/11/2020)   PRAPARE - Hydrologist (Medical): No    Lack of Transportation (Non-Medical): No  Physical Activity: Inactive (06/11/2020)   Exercise Vital Sign    Days of  Exercise per Week: 0 days    Minutes of Exercise per Session: 0 min  Stress: No Stress Concern Present (06/11/2020)   [place]    Feeling of Stress : Not at all  Social Connections: Moderately Isolated (06/11/2020)   Social Connection and Isolation Panel [NHANES]    Frequency of Communication with Friends and Family: More than three times a week    Frequency of Social Gatherings with Friends and Family: More than three times a week    Attends Religious Services: Never    Marine scientist or Organizations: No    Attends Archivist Meetings: Never    Marital Status: Married     Review of Systems  Constitutional:  Negative for appetite change and unexpected weight change.  HENT:  Negative for congestion, sinus pressure and  sore throat.   Eyes:  Negative for pain and visual disturbance.  Respiratory:  Negative for cough, chest tightness and shortness of breath.   Cardiovascular:  Negative for chest pain, palpitations and leg swelling.  Gastrointestinal:  Negative for abdominal pain, diarrhea, nausea and vomiting.  Genitourinary:  Negative for difficulty urinating and dysuria.  Musculoskeletal:  Positive for back pain. Negative for joint swelling and myalgias.  Skin:  Negative for color change and rash.  Neurological:  Negative for dizziness, light-headedness and headaches.  Hematological:  Negative for adenopathy. Does not bruise/bleed easily.  Psychiatric/Behavioral:  Negative for agitation and dysphoric mood.        Objective:     BP (!) 110/58 (BP Location: Left Arm, Patient Position: Sitting, Cuff Size: Normal)   Pulse 60   Temp 97.6 F (36.4 C) (Oral)   Ht 5' 3" (1.6 m)   Wt 140 lb 12.8 oz (63.9 kg)   SpO2 98%   BMI 24.94 kg/m  Wt Readings from Last 3 Encounters:  09/20/21 140 lb 12.8 oz (63.9 kg)  09/15/21 149 lb 14.6 oz (68 kg)  06/17/21 147 lb 14.9 oz (67.1 kg)    Physical Exam Vitals reviewed.  Constitutional:      General: She is not in acute distress.    Appearance: Normal appearance. She is well-developed.  HENT:     Head: Normocephalic and atraumatic.     Right Ear: External ear normal.     Left Ear: External ear normal.  Eyes:     General: No scleral icterus.       Right eye: No discharge.        Left eye: No discharge.     Conjunctiva/sclera: Conjunctivae normal.  Neck:     Thyroid: No thyromegaly.  Cardiovascular:     Rate and Rhythm: Normal rate and regular rhythm.  Pulmonary:     Effort: No tachypnea, accessory muscle usage or respiratory distress.     Breath sounds: Normal breath sounds. No decreased breath sounds or wheezing.  Chest:  Breasts:    Right: No inverted nipple, mass, nipple discharge or tenderness (no axillary adenopathy).     Left: No inverted  nipple, mass, nipple discharge or tenderness (no axilarry adenopathy).  Abdominal:     General: Bowel sounds are normal.     Palpations: Abdomen is soft.     Tenderness: There is no abdominal tenderness.  Musculoskeletal:        General: No swelling or tenderness.     Cervical back: Neck supple.     Comments: Increased pain - lower back - with standing and walking.  Lymphadenopathy:     Cervical: No cervical adenopathy.  Skin:    Findings: No erythema or rash.  Neurological:     Mental Status: She is alert and oriented to person, place, and time.  Psychiatric:        Mood and Affect: Mood normal.        Behavior: Behavior normal.      Outpatient Encounter Medications as of 09/20/2021  Medication Sig   aspirin 81 MG chewable tablet Chew 1 tablet (81 mg total) by mouth daily.   donepezil (ARICEPT) 10 MG tablet TAKE ONE TABLET AT BEDTIME.   levothyroxine (SYNTHROID) 75 MCG tablet TAKE ONE-HALF TABLET EVERY DAY (Patient taking differently: Take 75 mcg by mouth daily before breakfast.)   lisinopril (ZESTRIL) 20 MG tablet TAKE (1) TABLET BY MOUTH EVERY DAY   LORazepam (ATIVAN) 0.5 MG tablet 1 tablet in the am and 1/2 tablet q pm.   [EXPIRED] memantine (NAMENDA) 5 MG tablet Take 2 tablets by mouth at bedtime.   metoprolol tartrate (LOPRESSOR) 25 MG tablet TAKE (1) TABLET BY MOUTH TWICE DAILY   oxyCODONE-acetaminophen (PERCOCET/ROXICET) 5-325 MG tablet Take 1 tablet by mouth every 6 (six) hours as needed for moderate pain or severe pain.   QUEtiapine (SEROQUEL) 25 MG tablet Take 1 tablet (25 mg total) by mouth at bedtime.   Syringe/Needle, Disp, (SYRINGE 3CC/25GX1") 25G X 1" 3 ML MISC Use as directed to administer b12 injections.   No facility-administered encounter medications on file as of 09/20/2021.     Lab Results  Component Value Date   WBC 8.2 09/15/2021   HGB 11.8 (L) 09/15/2021   HCT 38.7 09/15/2021   PLT 301 09/15/2021   GLUCOSE 152 (H) 09/15/2021   CHOL 235 (H)  11/08/2020   TRIG 192.0 (H) 11/08/2020   HDL 39.00 (L) 11/08/2020   LDLDIRECT 164.0 10/12/2019   LDLCALC 158 (H) 11/08/2020   ALT 14 09/15/2021   AST 17 09/15/2021   NA 140 09/15/2021   K 4.3 09/15/2021   CL 107 09/15/2021   CREATININE 1.17 (H) 09/15/2021   BUN 31 (H) 09/15/2021   CO2 27 09/15/2021   TSH 2.74 11/08/2020   INR 1.0 01/21/2019   HGBA1C 6.6 (H) 05/21/2021   MICROALBUR 0.3 05/25/2020    DG Chest 2 View  Result Date: 09/16/2021 CLINICAL DATA:  Back pain EXAM: CHEST - 2 VIEW COMPARISON:  01/19/2019 FINDINGS: Heart and mediastinal contours are within normal limits. No focal opacities or effusions. No acute bony abnormality. IMPRESSION: No active cardiopulmonary disease. Electronically Signed   By: Rolm Baptise M.D.   On: 09/16/2021 00:30   DG Hip Unilat With Pelvis 2-3 Views Right  Result Date: 09/16/2021 CLINICAL DATA:  Back pain EXAM: DG HIP (WITH OR WITHOUT PELVIS) 2-3V RIGHT COMPARISON:  None Available. FINDINGS: Hip joints and SI joints symmetric. No acute bony abnormality. Specifically, no fracture, subluxation, or dislocation. Degenerative changes in the visualized lower lumbar spine. IMPRESSION: No acute bony abnormality. Electronically Signed   By: Rolm Baptise M.D.   On: 09/16/2021 00:30   DG Lumbar Spine Complete  Result Date: 09/16/2021 CLINICAL DATA:  Back pain EXAM: LUMBAR SPINE - COMPLETE 4+ VIEW COMPARISON:  None Available. FINDINGS: Diffuse degenerative disc disease and facet disease, most pronounced in the lower lumbar spine. Normal alignment. No fracture. SI joints symmetric and unremarkable. IMPRESSION: Degenerative disc and facet disease.  No acute bony abnormality. Electronically Signed   By: Rolm Baptise M.D.   On: 09/16/2021 00:29  Assessment & Plan:   Problem List Items Addressed This Visit     Aortic atherosclerosis ([place])    Had intolerance to statin medication.  Continue current blood pressure medication.        CAD (coronary artery  disease)    Known CAD.  S/p DES - RCA.  Continue plavix.  Sees cardiology.  Intolerant to statin medication.  Stable.  Follow.        Chronic kidney disease, stage 3a (HCC)    Avoid antiinflammatories.  Continue lisinopril.  Follow metabolic panel.       Dementia without behavioral disturbance (HCC)    On aricept and namenda.        Diabetes mellitus with circulatory complication (HCC)    On no medication.  Low carb diet and exercise.  Follow met b and a1c. Has lost weight.  Eating better.  Not eating as many sweets.  Follow.        Hypercholesteremia    Intolerant to statin medication.  Not taking zetia.  Low cholesterol diet and exercise.  Follow lipid panel.       Hypertension, essential    Continues on lisinopril and metoprolol.  Follow pressures.  Follow metabolic panel.       Hypothyroidism    On synthroid.  Follow tsh.       Low back pain    Back pain as outlined.  No known injuries or trauma.  Increased pain with standing - weight bearing.  Recent xrays - ER.  Hold on repeat xray.  Given amount of pain with weight bearing and change with ambulation (not walking around now) - will have them go over to [facility] for further evaluation.          Einar Pheasant, MD

## 2021-09-25 DIAGNOSIS — R350 Frequency of micturition: Secondary | ICD-10-CM | POA: Diagnosis not present

## 2021-09-25 DIAGNOSIS — R109 Unspecified abdominal pain: Secondary | ICD-10-CM | POA: Diagnosis not present

## 2021-09-25 DIAGNOSIS — M545 Low back pain, unspecified: Secondary | ICD-10-CM | POA: Diagnosis not present

## 2021-09-25 DIAGNOSIS — R3589 Other polyuria: Secondary | ICD-10-CM | POA: Diagnosis not present

## 2021-09-27 DIAGNOSIS — M545 Low back pain, unspecified: Secondary | ICD-10-CM | POA: Diagnosis not present

## 2021-09-30 ENCOUNTER — Telehealth: Payer: Self-pay | Admitting: Internal Medicine

## 2021-09-30 ENCOUNTER — Encounter: Payer: Self-pay | Admitting: Internal Medicine

## 2021-09-30 DIAGNOSIS — M545 Low back pain, unspecified: Secondary | ICD-10-CM | POA: Insufficient documentation

## 2021-09-30 NOTE — Assessment & Plan Note (Signed)
Back pain as outlined.  No known injuries or trauma.  Increased pain with standing - weight bearing.  Recent xrays - ER.  Hold on repeat xray.  Given amount of pain with weight bearing and change with ambulation (not walking around now) - will have them go over to [facility] for further evaluation.

## 2021-09-30 NOTE — Assessment & Plan Note (Signed)
On synthroid.  Follow tsh.   

## 2021-09-30 NOTE — Assessment & Plan Note (Signed)
On aricept and namenda.

## 2021-09-30 NOTE — Assessment & Plan Note (Signed)
Avoid antiinflammatories.  Continue lisinopril.  Follow metabolic panel.

## 2021-09-30 NOTE — Assessment & Plan Note (Signed)
Continues on lisinopril and metoprolol.  Follow pressures.  Follow metabolic panel.

## 2021-09-30 NOTE — Assessment & Plan Note (Signed)
Had intolerance to statin medication.  Continue current blood pressure medication.

## 2021-09-30 NOTE — Assessment & Plan Note (Signed)
Known CAD.  S/p DES - RCA.  Continue plavix.  Sees cardiology.  Intolerant to statin medication.  Stable.  Follow.

## 2021-09-30 NOTE — Telephone Encounter (Signed)
Please call pts husband.  Last visit, she was having increased pain when weight bearing.  I had referred over the [facility].  They were to go that day. Need to know if evaluated and how she is doing.  Also need records.

## 2021-09-30 NOTE — Assessment & Plan Note (Signed)
On no medication.  Low carb diet and exercise.  Follow met b and a1c. Has lost weight.  Eating better.  Not eating as many sweets.  Follow.

## 2021-09-30 NOTE — Assessment & Plan Note (Signed)
Intolerant to statin medication.  Not taking zetia.  Low cholesterol diet and exercise.  Follow lipid panel.

## 2021-10-01 NOTE — Telephone Encounter (Signed)
I called and the call could not go through, no VM.  Monea Pesantez,cma

## 2021-10-02 DIAGNOSIS — E119 Type 2 diabetes mellitus without complications: Secondary | ICD-10-CM | POA: Diagnosis not present

## 2021-10-02 DIAGNOSIS — E039 Hypothyroidism, unspecified: Secondary | ICD-10-CM | POA: Diagnosis not present

## 2021-10-02 DIAGNOSIS — I10 Essential (primary) hypertension: Secondary | ICD-10-CM | POA: Diagnosis not present

## 2021-10-02 DIAGNOSIS — D649 Anemia, unspecified: Secondary | ICD-10-CM | POA: Diagnosis not present

## 2021-10-02 DIAGNOSIS — E785 Hyperlipidemia, unspecified: Secondary | ICD-10-CM | POA: Diagnosis not present

## 2021-10-02 NOTE — Telephone Encounter (Signed)
Call would not go through on either number.  Athony Coppa,cma

## 2021-10-03 NOTE — Telephone Encounter (Signed)
Calls are being routed and unable to lvm?

## 2021-10-04 NOTE — Telephone Encounter (Signed)
Called pt but neither # is going through. Unable to lvm for pt to return call.

## 2021-10-07 NOTE — Telephone Encounter (Signed)
Called pt x2 but neither # is going through. Unable to lvm x2 for pt to return call.

## 2021-10-10 NOTE — Telephone Encounter (Signed)
Called and after several attempts could not reach patient.  Amanda [name],cma

## 2021-11-17 ENCOUNTER — Emergency Department
Admission: EM | Admit: 2021-11-17 | Discharge: 2021-11-17 | Disposition: A | Discharge status: Other Acute Inpatient Hospital | Payer: Medicare PPO | Attending: Emergency Medicine | Admitting: Emergency Medicine

## 2021-11-17 ENCOUNTER — Emergency Department: Payer: Medicare PPO

## 2021-11-17 ENCOUNTER — Other Ambulatory Visit: Payer: Self-pay

## 2021-11-17 DIAGNOSIS — R1084 Generalized abdominal pain: Secondary | ICD-10-CM | POA: Diagnosis not present

## 2021-11-17 DIAGNOSIS — I10 Essential (primary) hypertension: Secondary | ICD-10-CM | POA: Diagnosis not present

## 2021-11-17 DIAGNOSIS — K5730 Diverticulosis of large intestine without perforation or abscess without bleeding: Secondary | ICD-10-CM | POA: Diagnosis not present

## 2021-11-17 DIAGNOSIS — D72829 Elevated white blood cell count, unspecified: Secondary | ICD-10-CM | POA: Diagnosis not present

## 2021-11-17 DIAGNOSIS — M549 Dorsalgia, unspecified: Secondary | ICD-10-CM | POA: Diagnosis not present

## 2021-11-17 DIAGNOSIS — R1010 Upper abdominal pain, unspecified: Secondary | ICD-10-CM | POA: Diagnosis not present

## 2021-11-17 DIAGNOSIS — R1013 Epigastric pain: Secondary | ICD-10-CM

## 2021-11-17 DIAGNOSIS — R112 Nausea with vomiting, unspecified: Secondary | ICD-10-CM | POA: Diagnosis not present

## 2021-11-17 DIAGNOSIS — K2980 Duodenitis without bleeding: Secondary | ICD-10-CM | POA: Diagnosis not present

## 2021-11-17 LAB — HEPATIC FUNCTION PANEL
ALT: 41 U/L (ref 0–44)
AST: 31 U/L (ref 15–41)
Albumin: 4.2 g/dL (ref 3.5–5.0)
Alkaline Phosphatase: 130 U/L — ABNORMAL HIGH (ref 38–126)
Bilirubin, Direct: 0.1 mg/dL (ref 0.0–0.2)
Indirect Bilirubin: 1 mg/dL — ABNORMAL HIGH (ref 0.3–0.9)
Total Bilirubin: 1.1 mg/dL (ref 0.3–1.2)
Total Protein: 8.2 g/dL — ABNORMAL HIGH (ref 6.5–8.1)

## 2021-11-17 LAB — CBC WITH DIFFERENTIAL/PLATELET
Abs Immature Granulocytes: 0.05 10*3/uL (ref 0.00–0.07)
Basophils Absolute: 0.1 10*3/uL (ref 0.0–0.1)
Basophils Relative: 1 %
Eosinophils Absolute: 0.3 10*3/uL (ref 0.0–0.5)
Eosinophils Relative: 2 %
HCT: 41.2 % (ref 36.0–46.0)
Hemoglobin: 13.3 g/dL (ref 12.0–15.0)
Immature Granulocytes: 0 %
Lymphocytes Relative: 22 %
Lymphs Abs: 3.1 10*3/uL (ref 0.7–4.0)
MCH: 27.9 pg (ref 26.0–34.0)
MCHC: 32.3 g/dL (ref 30.0–36.0)
MCV: 86.4 fL (ref 80.0–100.0)
Monocytes Absolute: 1.4 10*3/uL — ABNORMAL HIGH (ref 0.1–1.0)
Monocytes Relative: 10 %
Neutro Abs: 9.3 10*3/uL — ABNORMAL HIGH (ref 1.7–7.7)
Neutrophils Relative %: 65 %
Platelets: 441 10*3/uL — ABNORMAL HIGH (ref 150–400)
RBC: 4.77 MIL/uL (ref 3.87–5.11)
RDW: 15.1 % (ref 11.5–15.5)
WBC: 14.2 10*3/uL — ABNORMAL HIGH (ref 4.0–10.5)
nRBC: 0 % (ref 0.0–0.2)

## 2021-11-17 LAB — BASIC METABOLIC PANEL
Anion gap: 11 (ref 5–15)
BUN: 52 mg/dL — ABNORMAL HIGH (ref 8–23)
CO2: 24 mmol/L (ref 22–32)
Calcium: 10 mg/dL (ref 8.9–10.3)
Chloride: 100 mmol/L (ref 98–111)
Creatinine, Ser: 1.45 mg/dL — ABNORMAL HIGH (ref 0.44–1.00)
GFR, Estimated: 35 mL/min — ABNORMAL LOW (ref >60)
Glucose, Bld: 127 mg/dL — ABNORMAL HIGH (ref 70–99)
Potassium: 5.7 mmol/L — ABNORMAL HIGH (ref 3.5–5.1)
Sodium: 135 mmol/L (ref 135–145)

## 2021-11-17 LAB — TROPONIN I (HIGH SENSITIVITY): Troponin I (High Sensitivity): 17 ng/L (ref <18)

## 2021-11-17 LAB — LIPASE, BLOOD: Lipase: 43 U/L (ref 11–51)

## 2021-11-17 MED ORDER — SUCRALFATE 1 G PO TABS: 1.0000 g | ORAL_TABLET | Freq: Four times a day (QID) | ORAL | 1 refills | Status: DC

## 2021-11-17 MED ORDER — LIDOCAINE VISCOUS HCL 2 % MT SOLN
15.0000 mL | Freq: Once | OROMUCOSAL | Status: DC
  Filled 2021-11-17: qty 15

## 2021-11-17 MED ORDER — ALUM & MAG HYDROXIDE-SIMETH 200-200-20 MG/5ML PO SUSP
30.0000 mL | Freq: Once | ORAL | Status: DC
  Filled 2021-11-17: qty 30

## 2021-11-17 MED ORDER — IOHEXOL 300 MG/ML  SOLN
75.0000 mL | Freq: Once | INTRAMUSCULAR | Status: AC | PRN
  Administered 2021-11-17: 75 mL via INTRAVENOUS

## 2021-11-17 MED ORDER — FAMOTIDINE 20 MG PO TABS: 20.0000 mg | ORAL_TABLET | Freq: Every day | ORAL | 1 refills | Status: DC

## 2021-11-17 NOTE — ED Triage Notes (Signed)
Pt in via EMS from [facility] with x/o abd pain, NVD. EMS reports pt denies pain but then reported epigastric pain en route.  181/65, 97.4 tmep., 98% RA, 110 CBG, HR 57. Pt with hx of dementia

## 2021-11-17 NOTE — ED Triage Notes (Signed)
Pt to ED via [facility] from [facility]. Pt reports abdominal pain ongoing for "forever". Pt points to epigastric region. Pt with hx dementia. Pt A&Ox1.

## 2021-11-17 NOTE — Discharge Instructions (Signed)
Please seek medical attention for any high fevers, chest pain, shortness of breath, change in behavior, persistent vomiting, bloody stool or any other new or concerning symptoms.  

## 2021-11-17 NOTE — ED Provider Notes (Signed)
[facility] Provider Note    Event Date/Time   First MD Initiated Contact with Patient 11/17/21 1947     (approximate)   History   Abdominal Pain   HPI  Amanda [name] is a 88 y.o. female who presents to the emergency department today because of concerns for abdominal pain.  Located in the epigastric region.  Started yesterday.  Has been persistent since then.  She does not recall what she was doing when the pain started.  States that she has had similar pain in the past although not as severe.  Has had nausea but no vomiting.  Does not recall if she has had any diarrhea or change in her bowel movements.     Physical Exam   Triage Vital Signs: ED Triage Vitals [11/17/21 1651]  Enc Vitals Group     BP (!) 145/86     Pulse Rate (!) 59     Resp 18     Temp 97.7 F (36.5 C)     Temp Source Oral     SpO2 99 %     Weight      Height      Head Circumference      Peak Flow      Pain Score      Pain Loc      Pain Edu?      Excl. in [place]?     Most recent vital signs: Vitals:   11/17/21 1651  BP: (!) 145/86  Pulse: (!) 59  Resp: 18  Temp: 97.7 F (36.5 C)  SpO2: 99%    General: Awake, alert, not completely oriented CV:  Good peripheral perfusion. Regular rate and rhythm. Resp:  Normal effort. Lungs clear. Abd:  No distention. Minimally tender to palpation in the epigastric region.  ED Results / Procedures / Treatments   Labs (all labs ordered are listed, but only abnormal results are displayed) Labs Reviewed  BASIC METABOLIC PANEL - Abnormal; Notable for the following components:      Result Value   Potassium 5.7 (*)    Glucose, Bld 127 (*)    BUN 52 (*)    Creatinine, Ser 1.45 (*)    GFR, Estimated 35 (*)    All other components within normal limits  CBC WITH DIFFERENTIAL/PLATELET - Abnormal; Notable for the following components:   WBC 14.2 (*)    Platelets 441 (*)    Neutro Abs 9.3 (*)    Monocytes Absolute 1.4 (*)    All other  components within normal limits  HEPATIC FUNCTION PANEL - Abnormal; Notable for the following components:   Total Protein 8.2 (*)    Alkaline Phosphatase 130 (*)    Indirect Bilirubin 1.0 (*)    All other components within normal limits  LIPASE, BLOOD  URINALYSIS, ROUTINE W REFLEX MICROSCOPIC  TROPONIN I (HIGH SENSITIVITY)  TROPONIN I (HIGH SENSITIVITY)     EKG  I, Nance Pear, attending physician, personally viewed and interpreted this EKG  EKG Time: 1658 Rate: 63 Rhythm: normal sinus rhythm Axis: normal Intervals: qtc 415 QRS: narrow ST changes: no st elevation Impression: normal ekg  RADIOLOGY I independently interpreted and visualized the CT abd/pel. My interpretation: No free air Radiology interpretation:  IMPRESSION:  1. Duodenitis (versus second portion) with underlying ulcer not  excluded. No associated bowel perforation.  2. Findings suggestive of a diarrheal state.  No colitis.  3. Colonic diverticulosis with no acute diverticulitis.  4. Minimally complex right  2.5 cm renal cyst. No further follow-up  indicated.  5. Tiny hiatal hernia.  6.  Aortic Atherosclerosis (ICD10-I70.0).      PROCEDURES:  Critical Care performed: No  Procedures   MEDICATIONS ORDERED IN ED: Medications - No data to display   IMPRESSION / MDM / [place] / ED COURSE  I reviewed the triage vital signs and the nursing notes.                              Differential diagnosis includes, but is not limited to, pancreatitis, gastritis, perforation.  Patient's presentation is most consistent with acute presentation with potential threat to life or bodily function.  Patient presented to the emergency department today because of concerns for epigastric pain.  On exam patient with minimal tenderness in the epigastric region.  Blood work did show a slight leukocytosis.  CT scan was obtained.  This was consistent with duodenitis.  No signs of perforation.  Patient did  feel better after GI cocktail.  At this time I do think clinically and radiographically duodenitis is likely the etiology of the patient's symptoms.  Discussed this with the patient.  Will plan on discharging with antiacid and sucralfate.  Will give patient dietary guidelines.  Will give GI follow-up information.   FINAL CLINICAL IMPRESSION(S) / ED DIAGNOSES   Final diagnoses:  Epigastric pain  Duodenitis    Note:  This document was prepared using Dragon voice recognition software and may include unintentional dictation errors.    Nance Pear, MD 11/17/21 2231

## 2021-11-17 NOTE — ED Provider Triage Note (Signed)
Emergency Medicine Provider Triage Evaluation Note  Amanda [name] , a 88 y.o. female  was evaluated in triage.  Pt complains of abdominal pain "for quite a while." Nausea without vomiting. No diarrhea. Reports pain is around her waist. Denies CP/SOB, dizziness.   Review of Systems  Positive: Abd pain, nausea Negative: V/d, fever  Physical Exam  There were no vitals taken for this visit. Gen:   Awake, no distress   Resp:  Normal effort  MSK:   Moves extremities without difficulty  Other:    Medical Decision Making  Medically screening exam initiated at 4:47 PM.  Appropriate orders placed.  Amanda [name] was informed that the remainder of the evaluation will be completed by another provider, this initial triage assessment does not replace that evaluation, and the importance of remaining in the ED until their evaluation is complete.     Marquette Old, PA-C 11/17/21 1650

## 2021-12-03 DIAGNOSIS — I10 Essential (primary) hypertension: Secondary | ICD-10-CM | POA: Diagnosis not present

## 2021-12-03 DIAGNOSIS — M1990 Unspecified osteoarthritis, unspecified site: Secondary | ICD-10-CM | POA: Diagnosis not present

## 2022-01-07 DIAGNOSIS — I10 Essential (primary) hypertension: Secondary | ICD-10-CM | POA: Diagnosis not present

## 2022-01-07 DIAGNOSIS — M159 Polyosteoarthritis, unspecified: Secondary | ICD-10-CM | POA: Diagnosis not present

## 2022-01-08 DIAGNOSIS — I10 Essential (primary) hypertension: Secondary | ICD-10-CM | POA: Diagnosis not present

## 2022-01-08 DIAGNOSIS — I2510 Atherosclerotic heart disease of native coronary artery without angina pectoris: Secondary | ICD-10-CM | POA: Diagnosis not present

## 2022-01-08 DIAGNOSIS — E782 Mixed hyperlipidemia: Secondary | ICD-10-CM | POA: Diagnosis not present

## 2022-01-08 DIAGNOSIS — I700 Atherosclerosis of aorta: Secondary | ICD-10-CM | POA: Diagnosis not present

## 2022-01-22 DIAGNOSIS — F0154 Vascular dementia, unspecified severity, with anxiety: Secondary | ICD-10-CM | POA: Diagnosis not present

## 2022-01-22 DIAGNOSIS — R451 Restlessness and agitation: Secondary | ICD-10-CM | POA: Diagnosis not present

## 2022-01-24 DIAGNOSIS — K219 Gastro-esophageal reflux disease without esophagitis: Secondary | ICD-10-CM | POA: Diagnosis not present

## 2022-01-24 DIAGNOSIS — M1990 Unspecified osteoarthritis, unspecified site: Secondary | ICD-10-CM | POA: Diagnosis not present

## 2022-01-24 DIAGNOSIS — E039 Hypothyroidism, unspecified: Secondary | ICD-10-CM | POA: Diagnosis not present

## 2022-01-24 DIAGNOSIS — I10 Essential (primary) hypertension: Secondary | ICD-10-CM | POA: Diagnosis not present

## 2022-01-29 DIAGNOSIS — E559 Vitamin D deficiency, unspecified: Secondary | ICD-10-CM | POA: Diagnosis not present

## 2022-01-29 DIAGNOSIS — F0154 Vascular dementia, unspecified severity, with anxiety: Secondary | ICD-10-CM | POA: Diagnosis not present

## 2022-01-29 DIAGNOSIS — I10 Essential (primary) hypertension: Secondary | ICD-10-CM | POA: Diagnosis not present

## 2022-01-29 DIAGNOSIS — K5900 Constipation, unspecified: Secondary | ICD-10-CM | POA: Diagnosis not present

## 2022-01-29 DIAGNOSIS — M159 Polyosteoarthritis, unspecified: Secondary | ICD-10-CM | POA: Diagnosis not present

## 2022-01-29 DIAGNOSIS — R2689 Other abnormalities of gait and mobility: Secondary | ICD-10-CM | POA: Diagnosis not present

## 2022-01-29 DIAGNOSIS — Z9181 History of falling: Secondary | ICD-10-CM | POA: Diagnosis not present

## 2022-01-29 DIAGNOSIS — K219 Gastro-esophageal reflux disease without esophagitis: Secondary | ICD-10-CM | POA: Diagnosis not present

## 2022-01-29 DIAGNOSIS — E039 Hypothyroidism, unspecified: Secondary | ICD-10-CM | POA: Diagnosis not present

## 2022-01-29 DIAGNOSIS — R2681 Unsteadiness on feet: Secondary | ICD-10-CM | POA: Diagnosis not present

## 2022-01-29 DIAGNOSIS — M6281 Muscle weakness (generalized): Secondary | ICD-10-CM | POA: Diagnosis not present

## 2022-01-31 DIAGNOSIS — R2681 Unsteadiness on feet: Secondary | ICD-10-CM | POA: Diagnosis not present

## 2022-01-31 DIAGNOSIS — M6281 Muscle weakness (generalized): Secondary | ICD-10-CM | POA: Diagnosis not present

## 2022-01-31 DIAGNOSIS — Z9181 History of falling: Secondary | ICD-10-CM | POA: Diagnosis not present

## 2022-01-31 DIAGNOSIS — R2689 Other abnormalities of gait and mobility: Secondary | ICD-10-CM | POA: Diagnosis not present

## 2022-02-03 DIAGNOSIS — R2681 Unsteadiness on feet: Secondary | ICD-10-CM | POA: Diagnosis not present

## 2022-02-03 DIAGNOSIS — R2689 Other abnormalities of gait and mobility: Secondary | ICD-10-CM | POA: Diagnosis not present

## 2022-02-03 DIAGNOSIS — M6281 Muscle weakness (generalized): Secondary | ICD-10-CM | POA: Diagnosis not present

## 2022-02-03 DIAGNOSIS — Z9181 History of falling: Secondary | ICD-10-CM | POA: Diagnosis not present

## 2022-02-05 DIAGNOSIS — R2689 Other abnormalities of gait and mobility: Secondary | ICD-10-CM | POA: Diagnosis not present

## 2022-02-05 DIAGNOSIS — M6281 Muscle weakness (generalized): Secondary | ICD-10-CM | POA: Diagnosis not present

## 2022-02-05 DIAGNOSIS — Z9181 History of falling: Secondary | ICD-10-CM | POA: Diagnosis not present

## 2022-02-05 DIAGNOSIS — R2681 Unsteadiness on feet: Secondary | ICD-10-CM | POA: Diagnosis not present

## 2022-02-07 DIAGNOSIS — R2689 Other abnormalities of gait and mobility: Secondary | ICD-10-CM | POA: Diagnosis not present

## 2022-02-07 DIAGNOSIS — R2681 Unsteadiness on feet: Secondary | ICD-10-CM | POA: Diagnosis not present

## 2022-02-07 DIAGNOSIS — Z9181 History of falling: Secondary | ICD-10-CM | POA: Diagnosis not present

## 2022-02-07 DIAGNOSIS — M6281 Muscle weakness (generalized): Secondary | ICD-10-CM | POA: Diagnosis not present

## 2022-02-10 DIAGNOSIS — Z9181 History of falling: Secondary | ICD-10-CM | POA: Diagnosis not present

## 2022-02-10 DIAGNOSIS — M6281 Muscle weakness (generalized): Secondary | ICD-10-CM | POA: Diagnosis not present

## 2022-02-10 DIAGNOSIS — R2681 Unsteadiness on feet: Secondary | ICD-10-CM | POA: Diagnosis not present

## 2022-02-10 DIAGNOSIS — R2689 Other abnormalities of gait and mobility: Secondary | ICD-10-CM | POA: Diagnosis not present

## 2022-02-12 DIAGNOSIS — R2681 Unsteadiness on feet: Secondary | ICD-10-CM | POA: Diagnosis not present

## 2022-02-12 DIAGNOSIS — R2689 Other abnormalities of gait and mobility: Secondary | ICD-10-CM | POA: Diagnosis not present

## 2022-02-12 DIAGNOSIS — Z9181 History of falling: Secondary | ICD-10-CM | POA: Diagnosis not present

## 2022-02-12 DIAGNOSIS — M6281 Muscle weakness (generalized): Secondary | ICD-10-CM | POA: Diagnosis not present

## 2022-02-14 DIAGNOSIS — R2689 Other abnormalities of gait and mobility: Secondary | ICD-10-CM | POA: Diagnosis not present

## 2022-02-14 DIAGNOSIS — R2681 Unsteadiness on feet: Secondary | ICD-10-CM | POA: Diagnosis not present

## 2022-02-14 DIAGNOSIS — M6281 Muscle weakness (generalized): Secondary | ICD-10-CM | POA: Diagnosis not present

## 2022-02-14 DIAGNOSIS — Z9181 History of falling: Secondary | ICD-10-CM | POA: Diagnosis not present

## 2022-02-17 DIAGNOSIS — Z9181 History of falling: Secondary | ICD-10-CM | POA: Diagnosis not present

## 2022-02-17 DIAGNOSIS — M6281 Muscle weakness (generalized): Secondary | ICD-10-CM | POA: Diagnosis not present

## 2022-02-17 DIAGNOSIS — R2681 Unsteadiness on feet: Secondary | ICD-10-CM | POA: Diagnosis not present

## 2022-02-17 DIAGNOSIS — R2689 Other abnormalities of gait and mobility: Secondary | ICD-10-CM | POA: Diagnosis not present

## 2022-02-21 DIAGNOSIS — R2689 Other abnormalities of gait and mobility: Secondary | ICD-10-CM | POA: Diagnosis not present

## 2022-02-21 DIAGNOSIS — M6281 Muscle weakness (generalized): Secondary | ICD-10-CM | POA: Diagnosis not present

## 2022-02-21 DIAGNOSIS — Z9181 History of falling: Secondary | ICD-10-CM | POA: Diagnosis not present

## 2022-02-21 DIAGNOSIS — R2681 Unsteadiness on feet: Secondary | ICD-10-CM | POA: Diagnosis not present

## 2022-02-25 DIAGNOSIS — R2689 Other abnormalities of gait and mobility: Secondary | ICD-10-CM | POA: Diagnosis not present

## 2022-02-25 DIAGNOSIS — R2681 Unsteadiness on feet: Secondary | ICD-10-CM | POA: Diagnosis not present

## 2022-02-25 DIAGNOSIS — M6281 Muscle weakness (generalized): Secondary | ICD-10-CM | POA: Diagnosis not present

## 2022-02-25 DIAGNOSIS — Z9181 History of falling: Secondary | ICD-10-CM | POA: Diagnosis not present

## 2022-02-26 DIAGNOSIS — F0154 Vascular dementia, unspecified severity, with anxiety: Secondary | ICD-10-CM | POA: Diagnosis not present

## 2022-02-26 DIAGNOSIS — I10 Essential (primary) hypertension: Secondary | ICD-10-CM | POA: Diagnosis not present

## 2022-02-26 DIAGNOSIS — E039 Hypothyroidism, unspecified: Secondary | ICD-10-CM | POA: Diagnosis not present

## 2022-02-26 DIAGNOSIS — E559 Vitamin D deficiency, unspecified: Secondary | ICD-10-CM | POA: Diagnosis not present

## 2022-02-26 DIAGNOSIS — M159 Polyosteoarthritis, unspecified: Secondary | ICD-10-CM | POA: Diagnosis not present

## 2022-02-26 DIAGNOSIS — K219 Gastro-esophageal reflux disease without esophagitis: Secondary | ICD-10-CM | POA: Diagnosis not present

## 2022-02-28 DIAGNOSIS — R2681 Unsteadiness on feet: Secondary | ICD-10-CM | POA: Diagnosis not present

## 2022-02-28 DIAGNOSIS — Z9181 History of falling: Secondary | ICD-10-CM | POA: Diagnosis not present

## 2022-02-28 DIAGNOSIS — R2689 Other abnormalities of gait and mobility: Secondary | ICD-10-CM | POA: Diagnosis not present

## 2022-02-28 DIAGNOSIS — M6281 Muscle weakness (generalized): Secondary | ICD-10-CM | POA: Diagnosis not present

## 2022-03-26 DIAGNOSIS — E559 Vitamin D deficiency, unspecified: Secondary | ICD-10-CM | POA: Diagnosis not present

## 2022-03-26 DIAGNOSIS — K219 Gastro-esophageal reflux disease without esophagitis: Secondary | ICD-10-CM | POA: Diagnosis not present

## 2022-03-26 DIAGNOSIS — F0154 Vascular dementia, unspecified severity, with anxiety: Secondary | ICD-10-CM | POA: Diagnosis not present

## 2022-03-26 DIAGNOSIS — I10 Essential (primary) hypertension: Secondary | ICD-10-CM | POA: Diagnosis not present

## 2022-03-26 DIAGNOSIS — M159 Polyosteoarthritis, unspecified: Secondary | ICD-10-CM | POA: Diagnosis not present

## 2022-03-26 DIAGNOSIS — E039 Hypothyroidism, unspecified: Secondary | ICD-10-CM | POA: Diagnosis not present

## 2022-04-23 DIAGNOSIS — M159 Polyosteoarthritis, unspecified: Secondary | ICD-10-CM | POA: Diagnosis not present

## 2022-04-23 DIAGNOSIS — F0154 Vascular dementia, unspecified severity, with anxiety: Secondary | ICD-10-CM | POA: Diagnosis not present

## 2022-04-23 DIAGNOSIS — E039 Hypothyroidism, unspecified: Secondary | ICD-10-CM | POA: Diagnosis not present

## 2022-04-23 DIAGNOSIS — E559 Vitamin D deficiency, unspecified: Secondary | ICD-10-CM | POA: Diagnosis not present

## 2022-04-23 DIAGNOSIS — K219 Gastro-esophageal reflux disease without esophagitis: Secondary | ICD-10-CM | POA: Diagnosis not present

## 2022-04-23 DIAGNOSIS — I10 Essential (primary) hypertension: Secondary | ICD-10-CM | POA: Diagnosis not present

## 2022-05-21 DIAGNOSIS — E559 Vitamin D deficiency, unspecified: Secondary | ICD-10-CM | POA: Diagnosis not present

## 2022-05-21 DIAGNOSIS — I10 Essential (primary) hypertension: Secondary | ICD-10-CM | POA: Diagnosis not present

## 2022-05-21 DIAGNOSIS — M159 Polyosteoarthritis, unspecified: Secondary | ICD-10-CM | POA: Diagnosis not present

## 2022-05-21 DIAGNOSIS — E039 Hypothyroidism, unspecified: Secondary | ICD-10-CM | POA: Diagnosis not present

## 2022-05-21 DIAGNOSIS — F0154 Vascular dementia, unspecified severity, with anxiety: Secondary | ICD-10-CM | POA: Diagnosis not present

## 2022-05-21 DIAGNOSIS — K219 Gastro-esophageal reflux disease without esophagitis: Secondary | ICD-10-CM | POA: Diagnosis not present

## 2022-05-28 DIAGNOSIS — R112 Nausea with vomiting, unspecified: Secondary | ICD-10-CM | POA: Diagnosis not present

## 2022-05-28 DIAGNOSIS — K219 Gastro-esophageal reflux disease without esophagitis: Secondary | ICD-10-CM | POA: Diagnosis not present

## 2022-05-28 DIAGNOSIS — F0154 Vascular dementia, unspecified severity, with anxiety: Secondary | ICD-10-CM | POA: Diagnosis not present

## 2022-06-18 DIAGNOSIS — M159 Polyosteoarthritis, unspecified: Secondary | ICD-10-CM | POA: Diagnosis not present

## 2022-06-18 DIAGNOSIS — I10 Essential (primary) hypertension: Secondary | ICD-10-CM | POA: Diagnosis not present

## 2022-06-18 DIAGNOSIS — K219 Gastro-esophageal reflux disease without esophagitis: Secondary | ICD-10-CM | POA: Diagnosis not present

## 2022-06-18 DIAGNOSIS — E039 Hypothyroidism, unspecified: Secondary | ICD-10-CM | POA: Diagnosis not present

## 2022-06-18 DIAGNOSIS — G301 Alzheimer's disease with late onset: Secondary | ICD-10-CM | POA: Diagnosis not present

## 2022-06-18 DIAGNOSIS — E559 Vitamin D deficiency, unspecified: Secondary | ICD-10-CM | POA: Diagnosis not present

## 2022-06-25 DIAGNOSIS — F0154 Vascular dementia, unspecified severity, with anxiety: Secondary | ICD-10-CM | POA: Diagnosis not present

## 2022-06-25 DIAGNOSIS — M159 Polyosteoarthritis, unspecified: Secondary | ICD-10-CM | POA: Diagnosis not present

## 2022-06-25 DIAGNOSIS — K219 Gastro-esophageal reflux disease without esophagitis: Secondary | ICD-10-CM | POA: Diagnosis not present

## 2022-07-09 DIAGNOSIS — I10 Essential (primary) hypertension: Secondary | ICD-10-CM | POA: Diagnosis not present

## 2022-07-09 DIAGNOSIS — I739 Peripheral vascular disease, unspecified: Secondary | ICD-10-CM | POA: Diagnosis not present

## 2022-07-09 DIAGNOSIS — E782 Mixed hyperlipidemia: Secondary | ICD-10-CM | POA: Diagnosis not present

## 2022-07-09 DIAGNOSIS — E119 Type 2 diabetes mellitus without complications: Secondary | ICD-10-CM | POA: Diagnosis not present

## 2022-07-09 DIAGNOSIS — I2510 Atherosclerotic heart disease of native coronary artery without angina pectoris: Secondary | ICD-10-CM | POA: Diagnosis not present

## 2022-07-16 DIAGNOSIS — M159 Polyosteoarthritis, unspecified: Secondary | ICD-10-CM | POA: Diagnosis not present

## 2022-07-16 DIAGNOSIS — G301 Alzheimer's disease with late onset: Secondary | ICD-10-CM | POA: Diagnosis not present

## 2022-07-16 DIAGNOSIS — K219 Gastro-esophageal reflux disease without esophagitis: Secondary | ICD-10-CM | POA: Diagnosis not present

## 2022-07-16 DIAGNOSIS — E559 Vitamin D deficiency, unspecified: Secondary | ICD-10-CM | POA: Diagnosis not present

## 2022-07-16 DIAGNOSIS — I10 Essential (primary) hypertension: Secondary | ICD-10-CM | POA: Diagnosis not present

## 2022-07-16 DIAGNOSIS — E039 Hypothyroidism, unspecified: Secondary | ICD-10-CM | POA: Diagnosis not present

## 2022-07-21 DIAGNOSIS — I10 Essential (primary) hypertension: Secondary | ICD-10-CM | POA: Diagnosis not present

## 2022-07-21 DIAGNOSIS — M159 Polyosteoarthritis, unspecified: Secondary | ICD-10-CM | POA: Diagnosis not present

## 2022-08-03 DIAGNOSIS — R4182 Altered mental status, unspecified: Secondary | ICD-10-CM | POA: Diagnosis not present

## 2022-08-06 DIAGNOSIS — R451 Restlessness and agitation: Secondary | ICD-10-CM | POA: Diagnosis not present

## 2022-08-06 DIAGNOSIS — E039 Hypothyroidism, unspecified: Secondary | ICD-10-CM | POA: Diagnosis not present

## 2022-08-06 DIAGNOSIS — I10 Essential (primary) hypertension: Secondary | ICD-10-CM | POA: Diagnosis not present

## 2022-08-06 DIAGNOSIS — M159 Polyosteoarthritis, unspecified: Secondary | ICD-10-CM | POA: Diagnosis not present

## 2022-08-06 DIAGNOSIS — G301 Alzheimer's disease with late onset: Secondary | ICD-10-CM | POA: Diagnosis not present

## 2022-08-06 DIAGNOSIS — K219 Gastro-esophageal reflux disease without esophagitis: Secondary | ICD-10-CM | POA: Diagnosis not present

## 2022-08-06 DIAGNOSIS — E559 Vitamin D deficiency, unspecified: Secondary | ICD-10-CM | POA: Diagnosis not present

## 2022-08-13 DIAGNOSIS — M159 Polyosteoarthritis, unspecified: Secondary | ICD-10-CM | POA: Diagnosis not present

## 2022-08-13 DIAGNOSIS — I10 Essential (primary) hypertension: Secondary | ICD-10-CM | POA: Diagnosis not present

## 2022-08-13 DIAGNOSIS — F0154 Vascular dementia, unspecified severity, with anxiety: Secondary | ICD-10-CM | POA: Diagnosis not present

## 2022-08-13 DIAGNOSIS — E039 Hypothyroidism, unspecified: Secondary | ICD-10-CM | POA: Diagnosis not present

## 2022-08-13 DIAGNOSIS — E559 Vitamin D deficiency, unspecified: Secondary | ICD-10-CM | POA: Diagnosis not present

## 2022-08-13 DIAGNOSIS — R451 Restlessness and agitation: Secondary | ICD-10-CM | POA: Diagnosis not present

## 2022-08-13 DIAGNOSIS — S90412A Abrasion, left great toe, initial encounter: Secondary | ICD-10-CM | POA: Diagnosis not present

## 2022-08-13 DIAGNOSIS — K219 Gastro-esophageal reflux disease without esophagitis: Secondary | ICD-10-CM | POA: Diagnosis not present

## 2022-08-20 DIAGNOSIS — L219 Seborrheic dermatitis, unspecified: Secondary | ICD-10-CM | POA: Diagnosis not present

## 2022-08-20 DIAGNOSIS — E039 Hypothyroidism, unspecified: Secondary | ICD-10-CM | POA: Diagnosis not present

## 2022-08-20 DIAGNOSIS — E559 Vitamin D deficiency, unspecified: Secondary | ICD-10-CM | POA: Diagnosis not present

## 2022-08-20 DIAGNOSIS — I10 Essential (primary) hypertension: Secondary | ICD-10-CM | POA: Diagnosis not present

## 2022-08-25 DIAGNOSIS — M159 Polyosteoarthritis, unspecified: Secondary | ICD-10-CM | POA: Diagnosis not present

## 2022-08-25 DIAGNOSIS — G301 Alzheimer's disease with late onset: Secondary | ICD-10-CM | POA: Diagnosis not present

## 2022-09-10 DIAGNOSIS — E039 Hypothyroidism, unspecified: Secondary | ICD-10-CM | POA: Diagnosis not present

## 2022-09-10 DIAGNOSIS — I10 Essential (primary) hypertension: Secondary | ICD-10-CM | POA: Diagnosis not present

## 2022-09-10 DIAGNOSIS — K219 Gastro-esophageal reflux disease without esophagitis: Secondary | ICD-10-CM | POA: Diagnosis not present

## 2022-09-10 DIAGNOSIS — F0154 Vascular dementia, unspecified severity, with anxiety: Secondary | ICD-10-CM | POA: Diagnosis not present

## 2022-09-10 DIAGNOSIS — E559 Vitamin D deficiency, unspecified: Secondary | ICD-10-CM | POA: Diagnosis not present

## 2022-09-10 DIAGNOSIS — M159 Polyosteoarthritis, unspecified: Secondary | ICD-10-CM | POA: Diagnosis not present

## 2022-09-23 DIAGNOSIS — F0390 Unspecified dementia without behavioral disturbance: Secondary | ICD-10-CM | POA: Diagnosis not present

## 2022-09-23 DIAGNOSIS — R2689 Other abnormalities of gait and mobility: Secondary | ICD-10-CM | POA: Diagnosis not present

## 2022-09-23 DIAGNOSIS — Z9181 History of falling: Secondary | ICD-10-CM | POA: Diagnosis not present

## 2022-09-24 DIAGNOSIS — F0390 Unspecified dementia without behavioral disturbance: Secondary | ICD-10-CM | POA: Diagnosis not present

## 2022-09-24 DIAGNOSIS — Z9181 History of falling: Secondary | ICD-10-CM | POA: Diagnosis not present

## 2022-09-24 DIAGNOSIS — R2689 Other abnormalities of gait and mobility: Secondary | ICD-10-CM | POA: Diagnosis not present

## 2022-09-27 ENCOUNTER — Emergency Department
Admission: EM | Admit: 2022-09-27 | Discharge: 2022-09-27 | Disposition: A | Discharge status: Home / Self Care | Payer: Medicare PPO | Source: Home / Self Care | Attending: Emergency Medicine | Admitting: Emergency Medicine

## 2022-09-27 ENCOUNTER — Emergency Department: Payer: Medicare PPO

## 2022-09-27 ENCOUNTER — Other Ambulatory Visit: Payer: Self-pay

## 2022-09-27 DIAGNOSIS — I959 Hypotension, unspecified: Secondary | ICD-10-CM | POA: Diagnosis not present

## 2022-09-27 DIAGNOSIS — F0390 Unspecified dementia without behavioral disturbance: Secondary | ICD-10-CM | POA: Diagnosis not present

## 2022-09-27 DIAGNOSIS — R739 Hyperglycemia, unspecified: Secondary | ICD-10-CM | POA: Diagnosis not present

## 2022-09-27 DIAGNOSIS — M159 Polyosteoarthritis, unspecified: Secondary | ICD-10-CM | POA: Diagnosis not present

## 2022-09-27 DIAGNOSIS — M19011 Primary osteoarthritis, right shoulder: Secondary | ICD-10-CM | POA: Diagnosis not present

## 2022-09-27 DIAGNOSIS — I10 Essential (primary) hypertension: Secondary | ICD-10-CM | POA: Diagnosis not present

## 2022-09-27 DIAGNOSIS — G301 Alzheimer's disease with late onset: Secondary | ICD-10-CM | POA: Diagnosis not present

## 2022-09-27 DIAGNOSIS — M25519 Pain in unspecified shoulder: Secondary | ICD-10-CM | POA: Diagnosis not present

## 2022-09-27 DIAGNOSIS — M25511 Pain in right shoulder: Secondary | ICD-10-CM | POA: Diagnosis not present

## 2022-09-27 MED ORDER — DICLOFENAC SODIUM 1 % EX GEL: 2.0000 g | Freq: Four times a day (QID) | CUTANEOUS | 0 refills | Status: DC

## 2022-09-27 NOTE — ED Notes (Signed)
Report given Danisha. Family transporting home.

## 2022-09-27 NOTE — ED Triage Notes (Signed)
Per EMT's report, patient is from [facility] and c/o right shoulder pain. Staff denies any recent falls. Patient is alert & oriented to baseline and also doesn't recall falling. Patient is able to move shoulder and touch shoulder, but c/o severe pain.

## 2022-09-27 NOTE — Discharge Instructions (Signed)
Your xray does not show any serious injury or dislocation. Take tylenol 650mg  every 6 hours for shoulder pain. You can also use Voltaren gel on the shoulder for pain.

## 2022-09-27 NOTE — ED Notes (Signed)
Patient taken to imaging. 

## 2022-09-27 NOTE — ED Provider Notes (Signed)
   [facility] Provider Note    Event Date/Time   First MD Initiated Contact with Patient 09/27/22 1403     (approximate)   History   Shoulder Pain   HPI  Amanda [name] is a 89 y.o. female with a past history of hypertension, dementia who is brought to the ED from [facility] due to right shoulder pain.  No falls or acute injuries.  Reports shoulder hurts with movement.  No chest pain or shortness of breath, no fever.     Physical Exam   Triage Vital Signs: ED Triage Vitals  Encounter Vitals Group     BP      Systolic BP Percentile      Diastolic BP Percentile      Pulse      Resp      Temp      Temp src      SpO2      Weight      Height      Head Circumference      Peak Flow      Pain Score      Pain Loc      Pain Education      Exclude from Growth Chart     Most recent vital signs: Vitals:   09/27/22 1430  BP: (!) 140/49  Pulse: 65  Resp: 18  Temp: 98.1 F (36.7 C)  SpO2: 100%    General: Awake, no distress.  CV:  Good peripheral perfusion.  Rate and rhythm Resp:  Normal effort.  Clear to auscultation bilaterally Abd:  No distention.  Other:  Right shoulder with tenderness laterally over the proximal humerus.  Intact range of motion.  No deformity, clavicle stable and nontender   ED Results / Procedures / Treatments   Labs (all labs ordered are listed, but only abnormal results are displayed) Labs Reviewed - No data to display   RADIOLOGY X-ray right shoulder interpreted by me, negative for fracture.  Radiology report reviewed   PROCEDURES:  Procedures   MEDICATIONS ORDERED IN ED: Medications - No data to display   IMPRESSION / MDM / ASSESSMENT AND PLAN / ED COURSE  I reviewed the triage vital signs and the nursing notes.                              Differential diagnosis includes, but is not limited to, proximal humerus fracture, shoulder dislocation, muscle strain, arthritis, impingement syndrome.  Doubt  septic arthritis.  Patient presents with atraumatic right shoulder pain.  No inflammatory changes.  Vital signs and exam are reassuring.  X-ray unremarkable.  Will treat with Tylenol and Voltaren gel, follow-up with primary care.     FINAL CLINICAL IMPRESSION(S) / ED DIAGNOSES   Final diagnoses:  Acute pain of right shoulder     Rx / DC Orders   ED Discharge Orders          Ordered    diclofenac Sodium (VOLTAREN) 1 % GEL  4 times daily        09/27/22 1449             Note:  This document was prepared using Dragon voice recognition software and may include unintentional dictation errors.   Sharman Cheek, MD 09/27/22 [phone]

## 2022-09-29 DIAGNOSIS — Z9181 History of falling: Secondary | ICD-10-CM | POA: Diagnosis not present

## 2022-09-29 DIAGNOSIS — R2689 Other abnormalities of gait and mobility: Secondary | ICD-10-CM | POA: Diagnosis not present

## 2022-09-29 DIAGNOSIS — F0390 Unspecified dementia without behavioral disturbance: Secondary | ICD-10-CM | POA: Diagnosis not present

## 2022-09-30 DIAGNOSIS — M19011 Primary osteoarthritis, right shoulder: Secondary | ICD-10-CM | POA: Diagnosis not present

## 2022-10-06 DIAGNOSIS — Z9181 History of falling: Secondary | ICD-10-CM | POA: Diagnosis not present

## 2022-10-06 DIAGNOSIS — F0390 Unspecified dementia without behavioral disturbance: Secondary | ICD-10-CM | POA: Diagnosis not present

## 2022-10-06 DIAGNOSIS — R2689 Other abnormalities of gait and mobility: Secondary | ICD-10-CM | POA: Diagnosis not present

## 2022-10-07 DIAGNOSIS — E039 Hypothyroidism, unspecified: Secondary | ICD-10-CM | POA: Diagnosis not present

## 2022-10-07 DIAGNOSIS — K219 Gastro-esophageal reflux disease without esophagitis: Secondary | ICD-10-CM | POA: Diagnosis not present

## 2022-10-07 DIAGNOSIS — I10 Essential (primary) hypertension: Secondary | ICD-10-CM | POA: Diagnosis not present

## 2022-10-07 DIAGNOSIS — E559 Vitamin D deficiency, unspecified: Secondary | ICD-10-CM | POA: Diagnosis not present

## 2022-10-07 DIAGNOSIS — G4700 Insomnia, unspecified: Secondary | ICD-10-CM | POA: Diagnosis not present

## 2022-10-07 DIAGNOSIS — M159 Polyosteoarthritis, unspecified: Secondary | ICD-10-CM | POA: Diagnosis not present

## 2022-10-07 DIAGNOSIS — F0154 Vascular dementia, unspecified severity, with anxiety: Secondary | ICD-10-CM | POA: Diagnosis not present

## 2022-10-09 DIAGNOSIS — R2689 Other abnormalities of gait and mobility: Secondary | ICD-10-CM | POA: Diagnosis not present

## 2022-10-09 DIAGNOSIS — Z9181 History of falling: Secondary | ICD-10-CM | POA: Diagnosis not present

## 2022-10-09 DIAGNOSIS — F0390 Unspecified dementia without behavioral disturbance: Secondary | ICD-10-CM | POA: Diagnosis not present

## 2022-10-10 DIAGNOSIS — Z9181 History of falling: Secondary | ICD-10-CM | POA: Diagnosis not present

## 2022-10-10 DIAGNOSIS — R2689 Other abnormalities of gait and mobility: Secondary | ICD-10-CM | POA: Diagnosis not present

## 2022-10-10 DIAGNOSIS — F0390 Unspecified dementia without behavioral disturbance: Secondary | ICD-10-CM | POA: Diagnosis not present

## 2022-10-13 ENCOUNTER — Telehealth: Payer: Self-pay

## 2022-10-13 DIAGNOSIS — F0390 Unspecified dementia without behavioral disturbance: Secondary | ICD-10-CM | POA: Diagnosis not present

## 2022-10-13 DIAGNOSIS — R2689 Other abnormalities of gait and mobility: Secondary | ICD-10-CM | POA: Diagnosis not present

## 2022-10-13 DIAGNOSIS — Z9181 History of falling: Secondary | ICD-10-CM | POA: Diagnosis not present

## 2022-10-13 NOTE — Telephone Encounter (Signed)
Transition Care Management Unsuccessful Follow-up Telephone Call  Date of discharge and from where:  [place] 8/31  Attempts:  1st Attempt  Reason for unsuccessful TCM follow-up call:  No answer/busy   Lenard Forth [place]  [facility], [facility] Guide, Phone: [phone] Website: [email or web address]

## 2022-10-14 ENCOUNTER — Telehealth: Payer: Self-pay

## 2022-10-14 NOTE — Telephone Encounter (Signed)
Transition Care Management Follow-up Telephone Call Date of discharge and from where: [place] 8/31 How have you been since you were released from the hospital? Patient is in a SNF and is doing ok. Any questions or concerns? No  Items Reviewed: Did the pt receive and understand the discharge instructions provided? Yes  Medications obtained and verified? Yes  Other? No  Any new allergies since your discharge? No  Dietary orders reviewed? No Do you have support at home? Yes  SNF   Follow up appointments reviewed:  PCP Hospital f/u appt confirmed? Yes  Scheduled to see  on  @ . Specialist Hospital f/u appt confirmed? Yes  Scheduled to see  on  @ . Are transportation arrangements needed? No  If their condition worsens, is the pt aware to call PCP or go to the Emergency Dept.? Yes Was the patient provided with contact information for the PCP's office or ED? Yes Was to pt encouraged to call back with questions or concerns? Yes

## 2022-10-15 DIAGNOSIS — F0390 Unspecified dementia without behavioral disturbance: Secondary | ICD-10-CM | POA: Diagnosis not present

## 2022-10-15 DIAGNOSIS — Z9181 History of falling: Secondary | ICD-10-CM | POA: Diagnosis not present

## 2022-10-15 DIAGNOSIS — R2689 Other abnormalities of gait and mobility: Secondary | ICD-10-CM | POA: Diagnosis not present

## 2022-10-16 DIAGNOSIS — M159 Polyosteoarthritis, unspecified: Secondary | ICD-10-CM | POA: Diagnosis not present

## 2022-10-16 DIAGNOSIS — G301 Alzheimer's disease with late onset: Secondary | ICD-10-CM | POA: Diagnosis not present

## 2022-10-17 DIAGNOSIS — F0390 Unspecified dementia without behavioral disturbance: Secondary | ICD-10-CM | POA: Diagnosis not present

## 2022-10-17 DIAGNOSIS — Z9181 History of falling: Secondary | ICD-10-CM | POA: Diagnosis not present

## 2022-10-17 DIAGNOSIS — R2689 Other abnormalities of gait and mobility: Secondary | ICD-10-CM | POA: Diagnosis not present

## 2022-10-20 DIAGNOSIS — R2689 Other abnormalities of gait and mobility: Secondary | ICD-10-CM | POA: Diagnosis not present

## 2022-10-20 DIAGNOSIS — Z9181 History of falling: Secondary | ICD-10-CM | POA: Diagnosis not present

## 2022-10-20 DIAGNOSIS — F0390 Unspecified dementia without behavioral disturbance: Secondary | ICD-10-CM | POA: Diagnosis not present

## 2022-10-22 DIAGNOSIS — R2689 Other abnormalities of gait and mobility: Secondary | ICD-10-CM | POA: Diagnosis not present

## 2022-10-22 DIAGNOSIS — Z9181 History of falling: Secondary | ICD-10-CM | POA: Diagnosis not present

## 2022-10-22 DIAGNOSIS — F0390 Unspecified dementia without behavioral disturbance: Secondary | ICD-10-CM | POA: Diagnosis not present

## 2022-10-24 DIAGNOSIS — Z9181 History of falling: Secondary | ICD-10-CM | POA: Diagnosis not present

## 2022-10-24 DIAGNOSIS — R2689 Other abnormalities of gait and mobility: Secondary | ICD-10-CM | POA: Diagnosis not present

## 2022-10-24 DIAGNOSIS — F0390 Unspecified dementia without behavioral disturbance: Secondary | ICD-10-CM | POA: Diagnosis not present

## 2022-10-27 DIAGNOSIS — Z9181 History of falling: Secondary | ICD-10-CM | POA: Diagnosis not present

## 2022-10-27 DIAGNOSIS — R2689 Other abnormalities of gait and mobility: Secondary | ICD-10-CM | POA: Diagnosis not present

## 2022-10-27 DIAGNOSIS — F0390 Unspecified dementia without behavioral disturbance: Secondary | ICD-10-CM | POA: Diagnosis not present

## 2022-10-28 DIAGNOSIS — I10 Essential (primary) hypertension: Secondary | ICD-10-CM | POA: Diagnosis not present

## 2022-10-28 DIAGNOSIS — F0154 Vascular dementia, unspecified severity, with anxiety: Secondary | ICD-10-CM | POA: Diagnosis not present

## 2022-10-28 DIAGNOSIS — R296 Repeated falls: Secondary | ICD-10-CM | POA: Diagnosis not present

## 2022-10-30 DIAGNOSIS — F0390 Unspecified dementia without behavioral disturbance: Secondary | ICD-10-CM | POA: Diagnosis not present

## 2022-10-30 DIAGNOSIS — Z9181 History of falling: Secondary | ICD-10-CM | POA: Diagnosis not present

## 2022-10-30 DIAGNOSIS — R2689 Other abnormalities of gait and mobility: Secondary | ICD-10-CM | POA: Diagnosis not present

## 2022-11-03 DIAGNOSIS — Z9181 History of falling: Secondary | ICD-10-CM | POA: Diagnosis not present

## 2022-11-03 DIAGNOSIS — R2689 Other abnormalities of gait and mobility: Secondary | ICD-10-CM | POA: Diagnosis not present

## 2022-11-03 DIAGNOSIS — F0390 Unspecified dementia without behavioral disturbance: Secondary | ICD-10-CM | POA: Diagnosis not present

## 2022-11-06 DIAGNOSIS — K219 Gastro-esophageal reflux disease without esophagitis: Secondary | ICD-10-CM | POA: Diagnosis not present

## 2022-11-06 DIAGNOSIS — E039 Hypothyroidism, unspecified: Secondary | ICD-10-CM | POA: Diagnosis not present

## 2022-11-06 DIAGNOSIS — F0154 Vascular dementia, unspecified severity, with anxiety: Secondary | ICD-10-CM | POA: Diagnosis not present

## 2022-11-06 DIAGNOSIS — M159 Polyosteoarthritis, unspecified: Secondary | ICD-10-CM | POA: Diagnosis not present

## 2022-11-06 DIAGNOSIS — Z9181 History of falling: Secondary | ICD-10-CM | POA: Diagnosis not present

## 2022-11-06 DIAGNOSIS — E559 Vitamin D deficiency, unspecified: Secondary | ICD-10-CM | POA: Diagnosis not present

## 2022-11-06 DIAGNOSIS — N1831 Chronic kidney disease, stage 3a: Secondary | ICD-10-CM | POA: Diagnosis not present

## 2022-11-06 DIAGNOSIS — R2689 Other abnormalities of gait and mobility: Secondary | ICD-10-CM | POA: Diagnosis not present

## 2022-11-06 DIAGNOSIS — F0390 Unspecified dementia without behavioral disturbance: Secondary | ICD-10-CM | POA: Diagnosis not present

## 2022-11-06 DIAGNOSIS — I10 Essential (primary) hypertension: Secondary | ICD-10-CM | POA: Diagnosis not present

## 2022-11-06 DIAGNOSIS — G4700 Insomnia, unspecified: Secondary | ICD-10-CM | POA: Diagnosis not present

## 2022-11-07 DIAGNOSIS — Z9181 History of falling: Secondary | ICD-10-CM | POA: Diagnosis not present

## 2022-11-07 DIAGNOSIS — R2689 Other abnormalities of gait and mobility: Secondary | ICD-10-CM | POA: Diagnosis not present

## 2022-11-07 DIAGNOSIS — F0390 Unspecified dementia without behavioral disturbance: Secondary | ICD-10-CM | POA: Diagnosis not present

## 2022-11-10 DIAGNOSIS — F0390 Unspecified dementia without behavioral disturbance: Secondary | ICD-10-CM | POA: Diagnosis not present

## 2022-11-10 DIAGNOSIS — R2689 Other abnormalities of gait and mobility: Secondary | ICD-10-CM | POA: Diagnosis not present

## 2022-11-10 DIAGNOSIS — Z9181 History of falling: Secondary | ICD-10-CM | POA: Diagnosis not present

## 2022-12-02 DIAGNOSIS — I130 Hypertensive heart and chronic kidney disease with heart failure and stage 1 through stage 4 chronic kidney disease, or unspecified chronic kidney disease: Secondary | ICD-10-CM | POA: Diagnosis not present

## 2022-12-02 DIAGNOSIS — N183 Chronic kidney disease, stage 3 unspecified: Secondary | ICD-10-CM | POA: Diagnosis not present

## 2022-12-02 DIAGNOSIS — G4700 Insomnia, unspecified: Secondary | ICD-10-CM | POA: Diagnosis not present

## 2022-12-02 DIAGNOSIS — K219 Gastro-esophageal reflux disease without esophagitis: Secondary | ICD-10-CM | POA: Diagnosis not present

## 2022-12-02 DIAGNOSIS — E559 Vitamin D deficiency, unspecified: Secondary | ICD-10-CM | POA: Diagnosis not present

## 2022-12-02 DIAGNOSIS — F0154 Vascular dementia, unspecified severity, with anxiety: Secondary | ICD-10-CM | POA: Diagnosis not present

## 2022-12-02 DIAGNOSIS — E039 Hypothyroidism, unspecified: Secondary | ICD-10-CM | POA: Diagnosis not present

## 2022-12-02 DIAGNOSIS — M159 Polyosteoarthritis, unspecified: Secondary | ICD-10-CM | POA: Diagnosis not present

## 2023-02-13 ENCOUNTER — Emergency Department: Payer: Medicare Other

## 2023-02-13 ENCOUNTER — Other Ambulatory Visit: Payer: Self-pay

## 2023-02-13 ENCOUNTER — Emergency Department
Admission: EM | Admit: 2023-02-13 | Discharge: 2023-02-13 | Disposition: A | Discharge status: Home / Self Care | Payer: Medicare Other | Attending: Emergency Medicine | Admitting: Emergency Medicine

## 2023-02-13 DIAGNOSIS — W19XXXA Unspecified fall, initial encounter: Secondary | ICD-10-CM | POA: Diagnosis not present

## 2023-02-13 DIAGNOSIS — I10 Essential (primary) hypertension: Secondary | ICD-10-CM | POA: Diagnosis not present

## 2023-02-13 DIAGNOSIS — S0101XA Laceration without foreign body of scalp, initial encounter: Secondary | ICD-10-CM | POA: Insufficient documentation

## 2023-02-13 DIAGNOSIS — S0990XA Unspecified injury of head, initial encounter: Secondary | ICD-10-CM | POA: Diagnosis present

## 2023-02-13 DIAGNOSIS — R58 Hemorrhage, not elsewhere classified: Secondary | ICD-10-CM | POA: Diagnosis not present

## 2023-02-13 DIAGNOSIS — F0390 Unspecified dementia without behavioral disturbance: Secondary | ICD-10-CM | POA: Insufficient documentation

## 2023-02-13 DIAGNOSIS — I2510 Atherosclerotic heart disease of native coronary artery without angina pectoris: Secondary | ICD-10-CM | POA: Diagnosis not present

## 2023-02-13 DIAGNOSIS — W1839XA Other fall on same level, initial encounter: Secondary | ICD-10-CM | POA: Diagnosis not present

## 2023-02-13 MED ORDER — ACETAMINOPHEN 500 MG PO TABS
1000.0000 mg | ORAL_TABLET | Freq: Once | ORAL | Status: AC
  Administered 2023-02-13: 1000 mg via ORAL
  Filled 2023-02-13: qty 2

## 2023-02-13 MED ORDER — LIDOCAINE HCL (PF) 1 % IJ SOLN
5.0000 mL | Freq: Once | INTRAMUSCULAR | Status: DC
  Filled 2023-02-13: qty 5

## 2023-02-13 NOTE — Discharge Instructions (Signed)
Your CT scans of the head and neck were okay today.  There is a small laceration on the back of the head which was closed with 2 absorbable sutures. Please monitor the area for signs of infection.

## 2023-02-13 NOTE — ED Provider Triage Note (Signed)
Emergency Medicine Provider Triage Evaluation Note  MONSERATH ARCHIBALD , a 89 y.o. female  was evaluated in triage.  Pt presents via EMS for laceration to her scalp. She fell a couple of days ago, but she was not transported to the ER that day. While sitting with other residents her scalp started bleeding. No known falls today. Facility reports she is at her cognitive baseline .  Physical Exam  BP (!) 154/57 (BP Location: Left Arm)   Pulse 87   Temp (!) 97.5 F (36.4 C) (Oral)   Resp 17   Ht 5\' 4"  (1.626 m)   Wt 54.4 kg   SpO2 99%   BMI 20.60 kg/m  Gen:   Awake, no distress   Resp:  Normal effort  MSK:   Moves extremities without difficulty  Other:  Scalp laceration--bleeding well controlled with bandage.  Medical Decision Making  Medically screening exam initiated at 4:24 PM.  Appropriate orders placed.  SHALECE HERRERA was informed that the remainder of the evaluation will be completed by another provider, this initial triage assessment does not replace that evaluation, and the importance of remaining in the ED until their evaluation is complete.  CT head and cervical spine ordered.    Chinita Pester, FNP 02/13/23 1712

## 2023-02-13 NOTE — ED Notes (Signed)
See triage notes. Patient c/o head injury secondary to a fall a couple of days ago.

## 2023-02-13 NOTE — ED Provider Notes (Signed)
[facility] Provider Note    Event Date/Time   First MD Initiated Contact with Patient 02/13/23 1703     (approximate)   History   Head Injury   HPI  Amanda [name] is a 89 y.o. female with a history of hypertension, dementia, CAD who was brought to the ED due to bleeding wound from the head.  Patient had a fall a few days ago, initially was not transported, but today they noticed that the patient had a laceration with bleeding and send the patient in the ED for care.  Patient denies any pain or other complaints currently.      Physical Exam   Triage Vital Signs: ED Triage Vitals  Encounter Vitals Group     BP 02/13/23 1611 (!) 154/57     Systolic BP Percentile --      Diastolic BP Percentile --      Pulse Rate 02/13/23 1611 87     Resp 02/13/23 1611 17     Temp 02/13/23 1611 (!) 97.5 F (36.4 C)     Temp Source 02/13/23 1611 Oral     SpO2 02/13/23 1611 99 %     Weight 02/13/23 1612 120 lb (54.4 kg)     Height 02/13/23 1612 5\' 4"  (1.626 m)     Head Circumference --      Peak Flow --      Pain Score 02/13/23 1612 0     Pain Loc --      Pain Education --      Exclude from Growth Chart --     Most recent vital signs: Vitals:   02/13/23 1611  BP: (!) 154/57  Pulse: 87  Resp: 17  Temp: (!) 97.5 F (36.4 C)  SpO2: 99%    General: Awake, no distress.  CV:  Good peripheral perfusion.  Resp:  Normal effort.  Abd:  No distention.  Other:  No midline spinal tenderness.  Over the left posterior parietal scalp there is a small circular wound about 3 mm in diameter with bleeding through the wound.  There is a larger subcutaneous hematoma palpable over the posterior scalp measuring about 4 cm across which readily drains through the small wound when expressed   ED Results / Procedures / Treatments   Labs (all labs ordered are listed, but only abnormal results are displayed) Labs Reviewed - No data to display   RADIOLOGY CT head  interpreted by me, negative for intracranial bleeding.  Radiology report reviewed.  CT cervical spine unremarkable   PROCEDURES:  .Laceration Repair  Date/Time: 02/13/2023 10:44 PM  Performed by: Sharman Cheek, MD Authorized by: Sharman Cheek, MD   Consent:    Consent obtained:  Verbal   Consent given by:  Patient (Son at bedside)   Risks, benefits, and alternatives were discussed: yes     Risks discussed:  Infection and pain   Alternatives discussed:  No treatment Universal protocol:    Patient identity confirmed:  Verbally with patient and arm band Anesthesia:    Anesthesia method:  Local infiltration   Local anesthetic:  Lidocaine 1% w/o epi Laceration details:    Location:  Scalp   Scalp location:  L parietal   Length (cm):  0.3 Pre-procedure details:    Preparation:  Patient was prepped and draped in usual sterile fashion and imaging obtained to evaluate for foreign bodies Exploration:    Hemostasis achieved with:  Direct pressure   Imaging outcome: foreign body not  noted     Wound exploration: entire depth of wound visualized     Wound extent: no foreign body, no signs of injury and no underlying fracture     Contaminated: no   Treatment:    Area cleansed with:  Povidone-iodine and saline   Debridement:  None   Undermining:  None Skin repair:    Repair method:  Sutures   Suture size:  4-0   Suture material:  Fast-absorbing gut   Suture technique:  Figure eight   Number of sutures:  2 Approximation:    Approximation:  Close Repair type:    Repair type:  Simple Post-procedure details:    Dressing:  Sterile dressing   Procedure completion:  Tolerated well, no immediate complications    MEDICATIONS ORDERED IN ED: Medications  lidocaine (PF) (XYLOCAINE) 1 % injection 5 mL (has no administration in time range)  acetaminophen (TYLENOL) tablet 1,000 mg (1,000 mg Oral Given 02/13/23 1753)     IMPRESSION / MDM / ASSESSMENT AND PLAN / ED COURSE  I  reviewed the triage vital signs and the nursing notes.                              Differential diagnosis includes, but is not limited to, intracranial hemorrhage, skull fracture, C-spine fracture, spontaneously draining scalp hematoma  Patient sent to the ED due to scalp bleeding today.  Found to have a small defect overlying a scalp hematoma through which the hematoma is spontaneously evacuating.  Suspected defect just developed today and that she did not initially have a laceration from her fall a few days ago.  CT imaging is reassuring.  Area cleaned, irrigated, open wound closed with Vicryl, head wrapped with Coban applied pressure to the hematoma and prevent reaccumulation.     FINAL CLINICAL IMPRESSION(S) / ED DIAGNOSES   Final diagnoses:  Laceration of scalp, initial encounter  Chronic dementia (HCC)     Rx / DC Orders   ED Discharge Orders     None        Note:  This document was prepared using Dragon voice recognition software and may include unintentional dictation errors.   Sharman Cheek, MD 02/13/23 2245

## 2023-02-13 NOTE — ED Triage Notes (Signed)
Pt arrived via EMS from [facility]. Pt has dementia and fell a couple of days ago and pt son advised facility to not transport. Facility noticed today that pt has a laceration to the back of pt head that was bleeding. EMS wrapped pt head with guaze. Bleeding controlled at this time. Pt is acting at baseline at this time per EMS.

## 2023-02-14 ENCOUNTER — Telehealth: Payer: Self-pay | Admitting: Medical Oncology

## 2023-02-14 NOTE — Telephone Encounter (Signed)
Pt's DNR from [facility] was left in ED at discharge, DNR was mailed back to facility.

## 2023-03-25 DIAGNOSIS — D649 Anemia, unspecified: Secondary | ICD-10-CM | POA: Diagnosis not present

## 2023-03-25 DIAGNOSIS — I1310 Hypertensive heart and chronic kidney disease without heart failure, with stage 1 through stage 4 chronic kidney disease, or unspecified chronic kidney disease: Secondary | ICD-10-CM | POA: Diagnosis not present

## 2023-03-25 DIAGNOSIS — N183 Chronic kidney disease, stage 3 unspecified: Secondary | ICD-10-CM | POA: Diagnosis not present

## 2023-05-11 ENCOUNTER — Telehealth: Payer: Self-pay

## 2023-05-11 NOTE — Telephone Encounter (Signed)
 Called to pt to see if we can get her schedule as she has not been seen since 2023  and is on A1c compliance list husband answered and stated pt is now in [facility] she has alzheimer's.   I have removed Dr. Geralyn Knee as pts pcp

## 2023-08-01 ENCOUNTER — Observation Stay
Admission: EM | Admit: 2023-08-01 | Discharge: 2023-08-02 | Disposition: A | Discharge status: Home / Self Care | Attending: Internal Medicine | Admitting: Internal Medicine

## 2023-08-01 ENCOUNTER — Other Ambulatory Visit: Payer: Self-pay

## 2023-08-01 ENCOUNTER — Emergency Department

## 2023-08-01 DIAGNOSIS — I609 Nontraumatic subarachnoid hemorrhage, unspecified: Principal | ICD-10-CM

## 2023-08-01 DIAGNOSIS — I129 Hypertensive chronic kidney disease with stage 1 through stage 4 chronic kidney disease, or unspecified chronic kidney disease: Secondary | ICD-10-CM | POA: Diagnosis not present

## 2023-08-01 DIAGNOSIS — N1832 Chronic kidney disease, stage 3b: Secondary | ICD-10-CM | POA: Diagnosis not present

## 2023-08-01 DIAGNOSIS — I2510 Atherosclerotic heart disease of native coronary artery without angina pectoris: Secondary | ICD-10-CM | POA: Diagnosis present

## 2023-08-01 DIAGNOSIS — Z515 Encounter for palliative care: Secondary | ICD-10-CM

## 2023-08-01 DIAGNOSIS — W050XXA Fall from non-moving wheelchair, initial encounter: Secondary | ICD-10-CM | POA: Diagnosis not present

## 2023-08-01 DIAGNOSIS — Z7982 Long term (current) use of aspirin: Secondary | ICD-10-CM | POA: Diagnosis not present

## 2023-08-01 DIAGNOSIS — R58 Hemorrhage, not elsewhere classified: Secondary | ICD-10-CM | POA: Diagnosis not present

## 2023-08-01 DIAGNOSIS — W19XXXA Unspecified fall, initial encounter: Secondary | ICD-10-CM | POA: Diagnosis not present

## 2023-08-01 DIAGNOSIS — E039 Hypothyroidism, unspecified: Secondary | ICD-10-CM | POA: Diagnosis present

## 2023-08-01 DIAGNOSIS — E1122 Type 2 diabetes mellitus with diabetic chronic kidney disease: Secondary | ICD-10-CM | POA: Diagnosis not present

## 2023-08-01 DIAGNOSIS — N1831 Chronic kidney disease, stage 3a: Secondary | ICD-10-CM | POA: Diagnosis present

## 2023-08-01 DIAGNOSIS — Z79899 Other long term (current) drug therapy: Secondary | ICD-10-CM | POA: Diagnosis not present

## 2023-08-01 DIAGNOSIS — S0990XA Unspecified injury of head, initial encounter: Secondary | ICD-10-CM | POA: Diagnosis not present

## 2023-08-01 DIAGNOSIS — S0181XA Laceration without foreign body of other part of head, initial encounter: Secondary | ICD-10-CM | POA: Insufficient documentation

## 2023-08-01 DIAGNOSIS — S066XAA Traumatic subarachnoid hemorrhage with loss of consciousness status unknown, initial encounter: Secondary | ICD-10-CM | POA: Diagnosis not present

## 2023-08-01 DIAGNOSIS — S066X0A Traumatic subarachnoid hemorrhage without loss of consciousness, initial encounter: Secondary | ICD-10-CM | POA: Diagnosis not present

## 2023-08-01 DIAGNOSIS — E1159 Type 2 diabetes mellitus with other circulatory complications: Secondary | ICD-10-CM | POA: Diagnosis not present

## 2023-08-01 DIAGNOSIS — F0390 Unspecified dementia without behavioral disturbance: Secondary | ICD-10-CM | POA: Diagnosis present

## 2023-08-01 DIAGNOSIS — I10 Essential (primary) hypertension: Secondary | ICD-10-CM | POA: Diagnosis present

## 2023-08-01 DIAGNOSIS — I619 Nontraumatic intracerebral hemorrhage, unspecified: Secondary | ICD-10-CM | POA: Diagnosis present

## 2023-08-01 DIAGNOSIS — F32A Depression, unspecified: Secondary | ICD-10-CM | POA: Diagnosis not present

## 2023-08-01 MED ORDER — METOPROLOL TARTRATE 25 MG PO TABS
25.0000 mg | ORAL_TABLET | Freq: Once | ORAL | Status: AC
  Administered 2023-08-01: 25 mg via ORAL
  Filled 2023-08-01: qty 1

## 2023-08-01 MED ORDER — QUETIAPINE FUMARATE 25 MG PO TABS
50.0000 mg | ORAL_TABLET | Freq: Once | ORAL | Status: AC
  Administered 2023-08-01: 50 mg via ORAL
  Filled 2023-08-01: qty 2

## 2023-08-01 MED ORDER — LORAZEPAM 0.5 MG PO TABS
0.5000 mg | ORAL_TABLET | Freq: Once | ORAL | Status: AC
  Administered 2023-08-01: 0.5 mg via ORAL
  Filled 2023-08-01: qty 1

## 2023-08-01 MED ORDER — LIDOCAINE HCL (PF) 1 % IJ SOLN
5.0000 mL | Freq: Once | INTRAMUSCULAR | Status: AC
  Administered 2023-08-01: 5 mL
  Filled 2023-08-01: qty 5

## 2023-08-01 NOTE — ED Provider Notes (Addendum)
 [facility] Provider Note    Event Date/Time   First MD Initiated Contact with Patient 08/01/23 1849     (approximate)  History   Chief Complaint: Fall  HPI  Amanda [name] is a 89 y.o. female with advanced dementia, hypertension, hyperlipidemia who presents from her nursing facility after a fall.  According to report patient was at [facility] nursing facility when she had a witnessed fall forwards out of her wheelchair.  Patient has a laceration to the left forehead.  Family members here with the patient states at baseline.  Hemostatic currently.  No other obvious signs of trauma on exam.  Physical Exam   Triage Vital Signs: ED Triage Vitals  Encounter Vitals Group     BP 08/01/23 1831 (!) 145/87     Girls Systolic BP Percentile --      Girls Diastolic BP Percentile --      Boys Systolic BP Percentile --      Boys Diastolic BP Percentile --      Pulse Rate 08/01/23 1831 76     Resp 08/01/23 1831 20     Temp 08/01/23 1848 98.8 F (37.1 C)     Temp Source 08/01/23 1848 Rectal     SpO2 08/01/23 1831 100 %     Weight 08/01/23 1831 126 lb 1.6 oz (57.2 kg)     Height 08/01/23 1831 5' 4 (1.626 m)     Head Circumference --      Peak Flow --      Pain Score --      Pain Loc --      Pain Education --      Exclude from Growth Chart --     Most recent vital signs: Vitals:   08/01/23 1831 08/01/23 1848  BP: (!) 145/87   Pulse: 76   Resp: 20   Temp:  98.8 F (37.1 C)  SpO2: 100%     General: Awake, no distress.  At baseline per family.  Patient has approximately 2 cm laceration to the left forehead CV:  Good peripheral perfusion.  Regular rate and rhythm  Resp:  Normal effort.  Equal breath sounds bilaterally.  Abd:  No distention.  Soft, nontender.  No rebound or guarding. Other:  Good range of motion in all extremities.   ED Results / Procedures / Treatments   EKG  EKG viewed and interpreted by myself shows what could be sinus rhythm  versus atrial fibrillation at 84 bpm with a narrow QRS, normal axis, normal intervals, no concerning ST changes.  RADIOLOGY  I have reviewed and interpreted the CT images.  Patient appears to have a possible small amount of blood in the right and left frontal lobes.  Awaiting radiology read.   MEDICATIONS ORDERED IN ED: Medications  lidocaine  (PF) (XYLOCAINE ) 1 % injection 5 mL (has no administration in time range)     IMPRESSION / MDM / ASSESSMENT AND PLAN / ED COURSE  I reviewed the triage vital signs and the nursing notes.  Patient's presentation is most consistent with acute presentation with potential threat to life or bodily function.  Patient presents to the emergency department from a nursing facility where she had a witnessed fall out of her wheelchair.  Patient has suffered a laceration to the left forehead.  On my examination of the CT images patient appears to have a small amount of bleeding in the right frontal lobe awaiting radiology for confirmation.  Also possible small  amount of the left frontal lobe.  We will repair the laceration while awaiting radiology read.  Radiology confirms subarachnoid as well as small subdural.  I spoke to Dr. Penne Sharps of neurosurgery.  He recommends a repeat head CT in 6 hours.  If no significant change in the patient could be discharged back to her nursing facility.  I discussed this with the son who is agreeable.  LACERATION REPAIR Performed by: Franky Moores Authorized by: Franky Moores Consent: Verbal consent obtained. Risks and benefits: risks, benefits and alternatives were discussed Consent given by: patient Patient identity confirmed: provided demographic data Prepped and Draped in normal sterile fashion Wound explored  Laceration Location: Left forehead  Laceration Length: 2.5 cm  No Foreign Bodies seen or palpated  Anesthesia: local infiltration  Local anesthetic: lidocaine  1% without epinephrine  Anesthetic  total: 4 ml  Irrigation method: syringe Amount of cleaning: standard  Skin closure: 5-0 rapid Vicryl  Number of sutures: 6  Technique: Simple interrupted  Patient tolerance: Patient tolerated the procedure well with no immediate complications.   FINAL CLINICAL IMPRESSION(S) / ED DIAGNOSES   Intracranial hemorrhage Laceration    Note:  This document was prepared using Dragon voice recognition software and may include unintentional dictation errors.   Moores Franky, MD 08/01/23 2029    Moores Franky, MD 08/01/23 2214

## 2023-08-01 NOTE — ED Notes (Signed)
 This RN went to medicate pt and found pt's fall socks off. Per family member she is restless and won't keep her socks on. This RN instructed and educated family member of the importance of calling this RN with the call light if pt tries to get out of bed. Bed alarm remains on and fall bracelet remains on.

## 2023-08-01 NOTE — Discharge Instructions (Signed)
 You have suffered a laceration to your head that has been repaired with sutures.  These are absorbable sutures and will fall out on their own.  Please keep the area clean and covered for the next 7 days.  Return to the emergency department for any symptoms concerning to yourself.

## 2023-08-01 NOTE — ED Notes (Signed)
Fall bundle in place

## 2023-08-01 NOTE — ED Notes (Signed)
 Pt bleeding through bandage. Bandage replaced. Bleeding controlled. Dried blood on face wiped away.

## 2023-08-01 NOTE — ED Triage Notes (Signed)
 Pt in via [facility] from [facility] for witnessed fall out of wheelchair. Pt fell forwards out of chair. No Blood thinners. hx of LOC. GCS of 13 at baseline per EMS. Pt susained a LAC to the head. Bleeding appears controlled.

## 2023-08-02 ENCOUNTER — Encounter: Payer: Self-pay | Admitting: Family Medicine

## 2023-08-02 ENCOUNTER — Observation Stay

## 2023-08-02 ENCOUNTER — Emergency Department

## 2023-08-02 DIAGNOSIS — Z515 Encounter for palliative care: Secondary | ICD-10-CM | POA: Diagnosis not present

## 2023-08-02 DIAGNOSIS — I10 Essential (primary) hypertension: Secondary | ICD-10-CM

## 2023-08-02 DIAGNOSIS — S06350A Traumatic hemorrhage of left cerebrum without loss of consciousness, initial encounter: Secondary | ICD-10-CM | POA: Diagnosis not present

## 2023-08-02 DIAGNOSIS — E039 Hypothyroidism, unspecified: Secondary | ICD-10-CM | POA: Diagnosis not present

## 2023-08-02 DIAGNOSIS — W19XXXA Unspecified fall, initial encounter: Secondary | ICD-10-CM | POA: Diagnosis not present

## 2023-08-02 DIAGNOSIS — I609 Nontraumatic subarachnoid hemorrhage, unspecified: Principal | ICD-10-CM

## 2023-08-02 DIAGNOSIS — S066X0A Traumatic subarachnoid hemorrhage without loss of consciousness, initial encounter: Secondary | ICD-10-CM | POA: Diagnosis not present

## 2023-08-02 DIAGNOSIS — S065X0A Traumatic subdural hemorrhage without loss of consciousness, initial encounter: Secondary | ICD-10-CM | POA: Diagnosis not present

## 2023-08-02 DIAGNOSIS — N1831 Chronic kidney disease, stage 3a: Secondary | ICD-10-CM | POA: Diagnosis not present

## 2023-08-02 DIAGNOSIS — R220 Localized swelling, mass and lump, head: Secondary | ICD-10-CM | POA: Diagnosis not present

## 2023-08-02 DIAGNOSIS — F0390 Unspecified dementia without behavioral disturbance: Secondary | ICD-10-CM | POA: Diagnosis not present

## 2023-08-02 DIAGNOSIS — E1159 Type 2 diabetes mellitus with other circulatory complications: Secondary | ICD-10-CM | POA: Diagnosis not present

## 2023-08-02 DIAGNOSIS — I619 Nontraumatic intracerebral hemorrhage, unspecified: Secondary | ICD-10-CM | POA: Diagnosis present

## 2023-08-02 DIAGNOSIS — I2510 Atherosclerotic heart disease of native coronary artery without angina pectoris: Secondary | ICD-10-CM | POA: Diagnosis not present

## 2023-08-02 LAB — COMPREHENSIVE METABOLIC PANEL WITH GFR
ALT: 19 U/L (ref 0–44)
AST: 22 U/L (ref 15–41)
Albumin: 3.5 g/dL (ref 3.5–5.0)
Alkaline Phosphatase: 87 U/L (ref 38–126)
Anion gap: 11 (ref 5–15)
BUN: 28 mg/dL — ABNORMAL HIGH (ref 8–23)
CO2: 24 mmol/L (ref 22–32)
Calcium: 9.5 mg/dL (ref 8.9–10.3)
Chloride: 105 mmol/L (ref 98–111)
Creatinine, Ser: 0.95 mg/dL (ref 0.44–1.00)
GFR, Estimated: 58 mL/min — ABNORMAL LOW (ref >60)
Glucose, Bld: 126 mg/dL — ABNORMAL HIGH (ref 70–99)
Potassium: 3.4 mmol/L — ABNORMAL LOW (ref 3.5–5.1)
Sodium: 140 mmol/L (ref 135–145)
Total Bilirubin: 1.1 mg/dL (ref 0.0–1.2)
Total Protein: 6.3 g/dL — ABNORMAL LOW (ref 6.5–8.1)

## 2023-08-02 LAB — CBC WITH DIFFERENTIAL/PLATELET
Abs Immature Granulocytes: 0.03 K/uL (ref 0.00–0.07)
Basophils Absolute: 0 K/uL (ref 0.0–0.1)
Basophils Relative: 1 %
Eosinophils Absolute: 0 K/uL (ref 0.0–0.5)
Eosinophils Relative: 1 %
HCT: 31.8 % — ABNORMAL LOW (ref 36.0–46.0)
Hemoglobin: 10.2 g/dL — ABNORMAL LOW (ref 12.0–15.0)
Immature Granulocytes: 0 %
Lymphocytes Relative: 10 %
Lymphs Abs: 0.8 K/uL (ref 0.7–4.0)
MCH: 27.9 pg (ref 26.0–34.0)
MCHC: 32.1 g/dL (ref 30.0–36.0)
MCV: 86.9 fL (ref 80.0–100.0)
Monocytes Absolute: 0.9 K/uL (ref 0.1–1.0)
Monocytes Relative: 11 %
Neutro Abs: 6.5 K/uL (ref 1.7–7.7)
Neutrophils Relative %: 77 %
Platelets: 278 K/uL (ref 150–400)
RBC: 3.66 MIL/uL — ABNORMAL LOW (ref 3.87–5.11)
RDW: 17 % — ABNORMAL HIGH (ref 11.5–15.5)
WBC: 8.2 K/uL (ref 4.0–10.5)
nRBC: 0 % (ref 0.0–0.2)

## 2023-08-02 LAB — PROTIME-INR
INR: 1.1 (ref 0.8–1.2)
Prothrombin Time: 14.4 s (ref 11.4–15.2)

## 2023-08-02 MED ORDER — LABETALOL HCL 5 MG/ML IV SOLN: 10.0000 mg | INTRAVENOUS | Status: DC | PRN

## 2023-08-02 MED ORDER — OXYCODONE-ACETAMINOPHEN 5-325 MG PO TABS: 1.0000 | ORAL_TABLET | Freq: Four times a day (QID) | ORAL | Status: DC | PRN

## 2023-08-02 MED ORDER — ACETAMINOPHEN 325 MG PO TABS: 650.0000 mg | ORAL_TABLET | Freq: Four times a day (QID) | ORAL | Status: DC | PRN

## 2023-08-02 MED ORDER — METOPROLOL TARTRATE 25 MG PO TABS
25.0000 mg | ORAL_TABLET | Freq: Two times a day (BID) | ORAL | Status: DC
  Administered 2023-08-02: 25 mg via ORAL
  Filled 2023-08-02: qty 1

## 2023-08-02 MED ORDER — LISINOPRIL 20 MG PO TABS
20.0000 mg | ORAL_TABLET | Freq: Every day | ORAL | Status: DC
  Administered 2023-08-02: 20 mg via ORAL
  Filled 2023-08-02: qty 1

## 2023-08-02 MED ORDER — POLYETHYLENE GLYCOL 3350 17 G PO PACK
17.0000 g | PACK | Freq: Every day | ORAL | Status: DC
  Administered 2023-08-02: 17 g via ORAL
  Filled 2023-08-02: qty 1

## 2023-08-02 MED ORDER — QUETIAPINE FUMARATE 25 MG PO TABS: 75.0000 mg | ORAL_TABLET | Freq: Every day | ORAL | Status: DC

## 2023-08-02 MED ORDER — LORAZEPAM 0.5 MG PO TABS
0.5000 mg | ORAL_TABLET | Freq: Two times a day (BID) | ORAL | Status: DC
  Administered 2023-08-02: 0.5 mg via ORAL
  Filled 2023-08-02: qty 1

## 2023-08-02 MED ORDER — LABETALOL HCL 5 MG/ML IV SOLN
10.0000 mg | INTRAVENOUS | Status: DC | PRN
  Administered 2023-08-02: 10 mg via INTRAVENOUS
  Filled 2023-08-02: qty 4

## 2023-08-02 MED ORDER — ACETAMINOPHEN 650 MG RE SUPP: 650.0000 mg | Freq: Four times a day (QID) | RECTAL | Status: DC | PRN

## 2023-08-02 MED ORDER — ONDANSETRON HCL 4 MG PO TABS: 4.0000 mg | ORAL_TABLET | Freq: Four times a day (QID) | ORAL | Status: DC | PRN

## 2023-08-02 MED ORDER — DONEPEZIL HCL 5 MG PO TABS: 10.0000 mg | ORAL_TABLET | Freq: Every day | ORAL | Status: DC

## 2023-08-02 MED ORDER — HYDRALAZINE HCL 20 MG/ML IJ SOLN: 5.0000 mg | Freq: Four times a day (QID) | INTRAMUSCULAR | Status: DC | PRN

## 2023-08-02 MED ORDER — ONDANSETRON HCL 4 MG/2ML IJ SOLN: 4.0000 mg | Freq: Four times a day (QID) | INTRAMUSCULAR | Status: DC | PRN

## 2023-08-02 MED ORDER — TRAZODONE HCL 50 MG PO TABS: 25.0000 mg | ORAL_TABLET | Freq: Every day | ORAL | Status: DC

## 2023-08-02 MED ORDER — MORPHINE SULFATE (CONCENTRATE) 10 MG /0.5 ML PO SOLN: 5.0000 mg | ORAL | Status: DC | PRN

## 2023-08-02 MED ORDER — QUETIAPINE FUMARATE 25 MG PO TABS
50.0000 mg | ORAL_TABLET | Freq: Every morning | ORAL | Status: DC
  Administered 2023-08-02: 50 mg via ORAL
  Filled 2023-08-02: qty 2

## 2023-08-02 MED ORDER — LEVOTHYROXINE SODIUM 50 MCG PO TABS: 75.0000 ug | ORAL_TABLET | Freq: Every day | ORAL | Status: DC

## 2023-08-02 MED ORDER — NICARDIPINE HCL IN NACL 20-0.86 MG/200ML-% IV SOLN: 3.0000 mg/h | INTRAVENOUS | Status: DC

## 2023-08-02 NOTE — Assessment & Plan Note (Signed)
 Diet controlled.  Glucose okay here.  Okay to defer accuchecks and SSI here.

## 2023-08-02 NOTE — ED Notes (Signed)
 ED TO INPATIENT HANDOFF REPORT  ED Nurse Name and Phone #: [phone]  S Name/Age/Gender Amanda [name] 89 y.o. female Room/Bed: ED01A/ED01A  Code Status   Code Status: Prior  Home/SNF/Other Nursing Home Patient oriented to: self Is this baseline? Yes   Triage Complete: Triage complete  Chief Complaint Intracerebral hemorrhage [facility]) [I61.9]  Triage Note Pt in via [facility] from [facility] for witnessed fall out of wheelchair. Pt fell forwards out of chair. No Blood thinners. hx of LOC. GCS of 13 at baseline per EMS. Pt susained a LAC to the head. Bleeding appears controlled.   Allergies Allergies  Allergen Reactions   Atorvastatin  Other (See Comments)    Muscle Pain   Sulfa Antibiotics     Level of Care/Admitting Diagnosis ED Disposition     ED Disposition  Admit   Condition  --   Comment  Hospital Area: [facility] [[id number]]  Level of Care: Progressive [102]  Admit to Progressive based on following criteria: NEUROLOGICAL AND NEUROSURGICAL complex patients with significant risk of instability, who do not meet ICU criteria, yet require close observation or frequent assessment (< / = every 2 - 4 hours) with medical / nursing intervention.  Covid Evaluation: Asymptomatic - no recent exposure (last 10 days) testing not required  Diagnosis: Intracerebral hemorrhage (HCC) [431.ICD-9-CM]  Admitting Physician: JONEL LONNI SQUIBB [[id number]]  Attending Physician: JONEL LONNI SQUIBB [[id number]]          B Medical/Surgery History Past Medical History:  Diagnosis Date   Carotid bruit    bilaterally   Dementia (HCC)    Hypercholesterolemia    Hypertension    Hypothyroidism    Past Surgical History:  Procedure Laterality Date   CARPAL TUNNEL RELEASE     CHOLECYSTECTOMY  1991   CORONARY/GRAFT ACUTE MI REVASCULARIZATION N/A 01/21/2019   Procedure: Coronary/Graft Acute MI Revascularization;  Surgeon: Florencio Cara BIRCH, MD;  Location: [facility] INVASIVE  CV LAB;  Service: Cardiovascular;  Laterality: N/A;   LEFT HEART CATH AND CORONARY ANGIOGRAPHY N/A 01/21/2019   Procedure: LEFT HEART CATH AND CORONARY ANGIOGRAPHY;  Surgeon: Florencio Cara BIRCH, MD;  Location: [facility] INVASIVE CV LAB;  Service: Cardiovascular;  Laterality: N/A;   THORACIC AORTA STENT  12/13   heart stent     A IV Location/Drains/Wounds Patient Lines/Drains/Airways Status     Active Line/Drains/Airways     Name Placement date Placement time Site Days   Peripheral IV 08/02/23 20 G Anterior;Distal;Right Forearm 08/02/23  0205  Forearm  less than 1   Wound / Incision (Open or Dehisced) 06/19/21 Other (Comment) Finger (Comment which one) Left;Posterior weeping 06/19/21  1631  Finger (Comment which one)  774            Intake/Output Last 24 hours No intake or output data in the 24 hours ending 08/02/23 0231  Labs/Imaging No results found for this or any previous visit (from the past 48 hours). CT HEAD WO CONTRAST ( ) Result Date: 08/02/2023 CLINICAL DATA:  Subarachnoid hemorrhage [facility]) 6 hour repeat EXAM: CT HEAD WITHOUT CONTRAST TECHNIQUE: Contiguous axial images were obtained from the base of the skull through the vertex without intravenous contrast. RADIATION DOSE REDUCTION: This exam was performed according to the departmental dose-optimization program which includes automated exposure control, adjustment of the mA and/or kV according to patient size and/or use of iterative reconstruction technique. COMPARISON:  CT head 08/01/2023 6:52 p.m. FINDINGS: Brain: No evidence of large-territorial acute infarction. No mass lesion. Redemonstration of combination of bilateral  frontal moderate volume subarachnoid and intraparenchymal hemorrhages, right greater than left intraparenchymal component on the right measures of to 8 mm (6:12) and on the left 7 cm (6:10). Blooming of the right frontal intraparenchymal hemorrhage (CT: 12). No mass effect or midline shift. No hydrocephalus.  Basilar cisterns are patent. Vascular: No hyperdense vessel. Atherosclerotic calcifications are present within the cavernous internal carotid arteries. Skull: No acute fracture or focal lesion. Sinuses/Orbits: Left sphenoid sinus mucosal thickening. Paranasal sinuses and mastoid air cells are clear. Bilateral lens replacement. Otherwise the orbits are unremarkable. Other: Left frontal scalp soft tissue defect with underlying subcutaneus soft tissue emphysema. IMPRESSION: Blooming right frontal intraparenchymal hemorrhage. Redemonstration of combination of bilateral frontal moderate volume subarachnoid and intraparenchymal hemorrhages, right greater than left. No herniation. Electronically Signed   By: Morgane  Naveau M.D.   On: 08/02/2023 01:37   CT Head Wo Contrast Result Date: 08/01/2023 CLINICAL DATA:  Clemens out of chair laceration to the head EXAM: CT HEAD WITHOUT CONTRAST TECHNIQUE: Contiguous axial images were obtained from the base of the skull through the vertex without intravenous contrast. RADIATION DOSE REDUCTION: This exam was performed according to the departmental dose-optimization program which includes automated exposure control, adjustment of the mA and/or kV according to patient size and/or use of iterative reconstruction technique. COMPARISON:  CT brain 02/13/2023 FINDINGS: Brain: No acute territorial infarction or intracranial mass is visualized. Multifocal scattered foci of bilateral extra-axial/subarachnoid blood involving the right greater than left frontal lobes and anterior to the left temporal lobe. Small focus of extra-axial blood at the left anterior cranial vertex, series 3, image 22. Trace volume right subdural hematoma at the cranial vertex measures 3 mm maximum on coronal series 4, image 42, and suspect also trace 1-2 mm subdural blood along the right convexity on coronal series 4, image 39. Moderate atrophy. Mild chronic small vessel ischemic changes of the white matter. Stable  ventricle size. Vascular: No hyperdense vessels.  Carotid vascular calcification Skull: No fracture Sinuses/Orbits: Mucosal thickening and fluid in the sphenoid sinuses. Other: Left forehead scalp laceration. Traumatic Brain Injury Risk Stratification Skull Fracture: No - Low/mBIG 1 Subdural Hematoma (SDH): <79mm - mBIG 1 Subarachnoid Hemorrhage [facility]): multifocal, bilateral - High/mBIG 3 Epidural Hematoma (EDH): No - Low/mBIG 1 Cerebral contusion, intra-axial, intraparenchymal Hemorrhage (IPH): No Intraventricular Hemorrhage (IVH): No - Low/mBIG 1 Midline Shift > 1mm or Edema/effacement of sulci/vents: No - Low/mBIG 1 ---------------------------------------------------- IMPRESSION: 1. Multifocal scattered foci of bilateral extra-axial/subarachnoid blood involving the right greater than left frontal lobes and anterior to the left temporal lobe. Small focus of extra-axial blood at the left anterior cranial vertex. Trace volume right subdural hematoma measuring up to 3 mm at the cranial vertex and suspect also trace 1-2 mm subdural blood along the right convexity. No significant mass effect or midline shift. 2. Atrophy and chronic small vessel ischemic changes of the white matter. Critical Value/emergent results were called by telephone at the time of interpretation on 08/01/2023 at 7:19 pm to provider KEVIN PADUCHOWSKI , who verbally acknowledged these results. Electronically Signed   By: Luke Bun M.D.   On: 08/01/2023 19:19    Pending Labs Unresulted Labs (From admission, onward)     Start     Ordered   08/02/23 0231  Comprehensive metabolic panel  ONCE - STAT,   STAT        08/02/23 0230   08/02/23 0231  CBC with Differential/Platelet  ONCE - STAT,   STAT  08/02/23 0230   08/02/23 0231  Protime-INR  ONCE - STAT,   STAT        08/02/23 0230            Vitals/Pain Today's Vitals   08/01/23 2200 08/01/23 2308 08/02/23 0200 08/02/23 0226  BP: (!) 168/86 (!) 170/86 (!) 182/119 (!) 155/84   Pulse: (!) 102 (!) 102 82 87  Resp:  20 18 18   Temp:  98.1 F (36.7 C)    TempSrc:  Oral    SpO2:  100% (!) 80% 98%  Weight:      Height:        Isolation Precautions No active isolations  Medications Medications  labetalol  (NORMODYNE ) injection 10 mg (10 mg Intravenous Given 08/02/23 0205)  lidocaine  (PF) (XYLOCAINE ) 1 % injection 5 mL (5 mLs Infiltration Given by Other 08/01/23 1933)  QUEtiapine  (SEROQUEL ) tablet 50 mg (50 mg Oral Given 08/01/23 2046)  LORazepam  (ATIVAN ) tablet 0.5 mg (0.5 mg Oral Given 08/01/23 2200)  metoprolol  tartrate (LOPRESSOR ) tablet 25 mg (25 mg Oral Given 08/01/23 2200)    Mobility manual wheelchair     Focused Assessments Neuro Assessment Handoff:  Swallow screen pass?        Neuro Assessment: Exceptions to WDL Neuro Checks:      Has TPA been given?  If patient is a Neuro Trauma and patient is going to OR before floor call report to 4N Charge nurse: [phone] or [phone]   R Recommendations: See Admitting Provider Note  Report given to:   Additional Notes: pt is a bit restless and does tug at monitoring cords and IV lines. Pt currently has mittens on. Pt has not been combative

## 2023-08-02 NOTE — Consult Note (Signed)
 Consultation Note Date: 08/02/2023   Patient Name: Amanda [name]  DOB: 1933/09/29  MRN: [id number]  Age / Sex: 89 y.o., female  PCP: Patient, No Pcp Per Referring Physician: No att. providers found  Reason for Consultation: Establishing goals of care   HPI/Brief Hospital Course: 89 y.o. female  with past medical history of advanced dementia, CAD, DM, HLD, carotid stenosis, hypothyroidism and CKD stage 3b admitted from [facility] on 08/01/2023 with witnessed fall from wheelchair and struck her head and sustained scalp laceration.  CTH on arrival to ED showed small SDH and small IPH, repeat CTH at 6 hours enlarging IPH  Neurosurgery consulted  Palliative medicine was consulted for assisting with goals of care conversations.  Subjective:  Extensive chart review has been completed prior to meeting patient including labs, vital signs, imaging, progress notes, orders, and available advanced directive documents from current and previous encounters.  Visited with Ms. Pingley at her bedside. She is resting in bed with eyes closed, attempts to open eyes with calling of her name, unable to follow commands. Grandson-Richard at bedside.  Introduced myself as a Publishing rights manager as a member of the palliative care team. Explained palliative medicine is specialized medical care for people living with serious illness. It focuses on providing relief from the symptoms and stress of a serious illness. The goal is to improve quality of life for both the patient and the family.   Richard shares Ms. Licea has been a resident at [facility] for 4-5 years. Over that time he has witnessed a significant decline in her mentation and function. Richard lives at home with Ms. Axe' husband-Bruce who is advanced age and in poor health. Richard shares he has been in contact with Bruce regarding Ms. Glade' condition. Richard shares [place] [facility] currently follows Ms.  Paccione at [facility].  We discussed patient's current illness and what it means in the larger context of patient's on-going co-morbidities. Natural disease trajectory and expectations at EOL were discussed.   The difference between aggressive medical intervention and comfort care was discussed.  Charlie is clear he wishes to continue with hospice path and is hopeful Ms. Castrogiovanni can return to [facility] with [facility] following. TOC and primary team aware, actively engaged in assisting with discharge back to [facility].  All questions/concerns addressed. PMT will continue to follow and support patient as needed.  Objective: Primary Diagnoses: Present on Admission:  Intracerebral hemorrhage (HCC)  Hypertension, essential  Dementia without behavioral disturbance (HCC)  Hypothyroidism  CAD (coronary artery disease)  Diabetes mellitus with circulatory complication (HCC)  Chronic kidney disease, stage 3a (HCC)   Physical Exam Constitutional:      Appearance: She is ill-appearing.     Comments: Unable to follow commands or open eyes to calling of her name  Pulmonary:     Effort: Pulmonary effort is normal. No respiratory distress.  Skin:    General: Skin is warm and dry.  Neurological:     Motor: Weakness present.     Vital Signs: BP (!) 175/102 (BP Location: Left Arm)   Pulse 79   Temp 98.2 F (36.8 C) (Axillary)   Resp 20   Ht 5' 4 (1.626 m)   Wt 57.2 kg   SpO2 98%   BMI 21.65 kg/m  Pain Scale: PAINAD       IO: Intake/output summary:  Intake/Output Summary (Last 24 hours) at 08/02/2023 1638 Last data filed at 08/02/2023 1300 Gross per 24 hour  Intake  0 ml  Output 2400 ml  Net -2400 ml    LBM: Last BM Date :  (PTA) Baseline Weight: Weight: 57.2 kg Most recent weight: Weight: 57.2 kg       Assessment and Plan  SUMMARY OF RECOMMENDATIONS   Return to [facility] and resume [facility] services  Thank you for this consult and allowing Palliative  Medicine to participate in the care of Cy GEANNIE Lindau. Palliative medicine will continue to follow and assist as needed.   Time Total: 40 minutes  Time spent includes: Detailed review of medical records (labs, imaging, vital signs), medically appropriate exam (mental status, respiratory, cardiac, skin), discussed with treatment team, counseling and educating patient, family and staff, documenting clinical information, medication management and coordination of care.   Signed by: Waddell Lesches, DNP, AGNP-C Palliative Medicine    Please contact Palliative Medicine Team phone at [phone] for questions and concerns.  For individual provider: See Tracey

## 2023-08-02 NOTE — ED Notes (Signed)
 This RN cleaned pt's laceration after EDP sutured pt's laceration. This RN placed a clean and dry gauze over sutures. Pt also has a skin tear on the left elbow that this RN dressed with xeroform per EDP request.

## 2023-08-02 NOTE — ED Notes (Signed)
 Mittens were placed on pt due to pt being restless and tugging at monitoring cords and IV lines.

## 2023-08-02 NOTE — ED Notes (Signed)
 Patient transported to CT

## 2023-08-02 NOTE — Assessment & Plan Note (Addendum)
 CT head shows few small foci of traumatic left frontal SAH and slightly enlarging left frontal IPH wtihout symptoms. - Stop aspirin  - Strict BP control, <160 mmHg - PRN labetaolol and hydralazine  -Neurochecks - Consult neurosurgery, appreciate expertise - Repeat CT head in 6 hours

## 2023-08-02 NOTE — ED Provider Notes (Signed)
.-----------------------------------------   2:30 AM on 08/02/2023 -----------------------------------------  Blood pressure (!) 155/84, pulse 87, temperature 98.1 F (36.7 C), temperature source Oral, resp. rate 18, height 5' 4 (1.626 m), weight 57.2 kg, SpO2 98%.  Assuming care from Dr. Dorothyann.  In short, Amanda [name] is a 89 y.o. female with a chief complaint of Fall .  Refer to the original H&P for additional details.  The current plan of care is to follow-up CT head at 1 AM.  Clinical course as below.  Labetalol  IV PRN ordered, repeat CT head ordered, discussed with hospitalist who will admit the patient.  She is admitted.  Clinical Course as of 08/02/23 0231  [place] Aug 02, 2023  0144 CT HEAD WO CONTRAST ( ) IMPRESSION: Blooming right frontal intraparenchymal hemorrhage. Redemonstration of combination of bilateral frontal moderate volume subarachnoid and intraparenchymal hemorrhages, right greater than left. No herniation.   [TT]  0144 Received a call from radiology that patient is having worsening right frontal intraparenchymal hemorrhage without herniation.  Will reach out to neurosurgery. [TT]  0151 Consulted neurosurgery who took a look at the knee images, says no surgical intervention, wants repeat CT head in 6 hours at 7 PM.  Recommended blood pressure goal is less than 160, okay for IV labetalol . [TT]    Clinical Course User Index [TT] Waymond Lorelle Cummins, MD      Waymond Lorelle Cummins, MD 08/02/23 [phone]

## 2023-08-02 NOTE — H&P (Signed)
 History and Physical    Patient: Amanda [name] FMW:[id number] DOB: 06/14/1933 DOA: 08/01/2023 DOS: the patient was seen and examined on 08/02/2023 PCP: Patient, No Pcp Per  Patient coming from: ALF/ILF  Chief Complaint:  Chief Complaint  Patient presents with   Fall       HPI:  89 y.o. F with dementia, lives in ALF, CAD, DM, HLD, carotid stenosis, hypothyroidism and CKD IIIa presented with fall out of her wheelchair.  Caveat that patient has advanced dementia and can provide no meaningful history.    Evidently was sitting in her wheelchair at her facility tonight, had a witnessed fall forward, struck her head, sustained a scalp laceration.  No changes to her health had been noticed prior to that.  In the ER, appeared to be at cognitive baseline.  CTH showed small SDH and small IPH.  At 6 hours, the IPH was enlarging, and so Neurogsurgery recommended BP target <160 mmHg and serial CT head.      Review of Systems  Unable to perform ROS: Dementia     Past Medical History:  Diagnosis Date   Carotid bruit    bilaterally   Dementia (HCC)    Hypercholesterolemia    Hypertension    Hypothyroidism    Past Surgical History:  Procedure Laterality Date   CARPAL TUNNEL RELEASE     CHOLECYSTECTOMY  1991   CORONARY/GRAFT ACUTE MI REVASCULARIZATION N/A 01/21/2019   Procedure: Coronary/Graft Acute MI Revascularization;  Surgeon: Florencio Cara BIRCH, MD;  Location: [facility] INVASIVE CV LAB;  Service: Cardiovascular;  Laterality: N/A;   LEFT HEART CATH AND CORONARY ANGIOGRAPHY N/A 01/21/2019   Procedure: LEFT HEART CATH AND CORONARY ANGIOGRAPHY;  Surgeon: Florencio Cara BIRCH, MD;  Location: [facility] INVASIVE CV LAB;  Service: Cardiovascular;  Laterality: N/A;   THORACIC AORTA STENT  12/13   heart stent   Social History:  reports that she has never smoked. She has never used smokeless tobacco. She reports that she does not drink alcohol and does not use drugs.  Allergies  Allergen Reactions    Atorvastatin  Other (See Comments)    Muscle Pain   Sulfa Antibiotics     Family History  Problem Relation Age of Onset   Hypertension Father    Lung cancer Father    Hypertension Mother    Dementia Mother    Arthritis Other    Breast cancer Cousin        mat cousin   Colon cancer Neg Hx     Prior to Admission medications   Medication Sig Start Date End Date Taking? Authorizing Provider  acetaminophen  (TYLENOL ) 650 MG CR tablet Take 650 mg by mouth every 8 (eight) hours as needed for pain.   Yes [provider]  aspirin  81 MG chewable tablet Chew 1 tablet (81 mg total) by mouth daily. 01/25/19  Yes Dickie Begun, MD  diclofenac  Sodium (VOLTAREN ) 1 % GEL Apply 2 g topically 4 (four) times daily. 09/27/22  Yes Viviann Pastor, MD  famotidine  (PEPCID ) 20 MG tablet Take 1 tablet (20 mg total) by mouth daily. Patient taking differently: Take 20 mg by mouth 2 (two) times daily. 11/17/21 08/02/23 Yes Goodman, Graydon, MD  Ferrous Sulfate (IRON) 325 (65 Fe) MG TABS Take 1 tablet by mouth 2 (two) times daily. 03/14/23  Yes [provider]  levothyroxine  (SYNTHROID ) 75 MCG tablet TAKE ONE-HALF TABLET EVERY DAY Patient taking differently: Take 75 mcg by mouth daily before breakfast. 04/16/20  Yes Glendia Shad, MD  lisinopril  (ZESTRIL ) 20 MG tablet TAKE (1) TABLET BY MOUTH EVERY DAY 01/07/21  Yes Glendia Shad, MD  loperamide (IMODIUM A-D) 2 MG tablet Take 1 tablet by mouth every 6 (six) hours as needed. 07/30/23  Yes [provider]  donepezil  (ARICEPT ) 10 MG tablet TAKE ONE TABLET AT BEDTIME. 04/30/20   Glendia Shad, MD  LORazepam  (ATIVAN ) 0.5 MG tablet 1 tablet in the am and 1/2 tablet q pm. 08/21/21   Glendia Shad, MD  metoprolol  tartrate (LOPRESSOR ) 25 MG tablet TAKE (1) TABLET BY MOUTH TWICE DAILY 02/11/21   Glendia Shad, MD  oxyCODONE -acetaminophen  (PERCOCET/ROXICET) 5-325 MG tablet Take 1 tablet by mouth every 6 (six) hours as needed for moderate pain or  severe pain. 06/19/21   Alexander, Natalie, DO  QUEtiapine  (SEROQUEL ) 25 MG tablet Take 1 tablet (25 mg total) by mouth at bedtime. 06/19/21   Alexander, Natalie, DO  sucralfate  (CARAFATE ) 1 g tablet Take 1 tablet (1 g total) by mouth 4 (four) times daily. 11/17/21 11/17/22  Floy Roberts, MD  Syringe/Needle, Disp, (SYRINGE 3CC/25GX1) 25G X 1 3 ML MISC Use as directed to administer b12 injections. 06/07/19   Glendia Shad, MD    Physical Exam: Vitals:   08/01/23 2200 08/01/23 2308 08/02/23 0200 08/02/23 0226  BP: (!) 168/86 (!) 170/86 (!) 182/119 (!) 155/84  Pulse: (!) 102 (!) 102 82 87  Resp:  20 18 18   Temp:  98.1 F (36.7 C)    TempSrc:  Oral    SpO2:  100% (!) 80% 98%  Weight:      Height:       Thin elderly female, lying in bed, restless, mittens on hands, does not make eye contact Pupils are equal and reactive.  There is bruising over the left eye, and a laceration of the left scalp which is now hemostatic.  Oropharynx appears tacky dry, no oral lesions that I can appreciate, on limited exam given confusion RRR, no murmurs, no peripheral edema, no JVD Respiratory rate normal, lung sounds normal without rales or wheezes Abdomen soft, no grimace to palpation, no rigidity She resists and all 4 extremities, which seem to have good tone.  Extraocular movements seem to go in all directions, pupils are equal and reactive.  Nursing report no neurological change since presentation.  She is babbling incoherently, and restless, does not follow commands well.     Data Reviewed: Message sent to neurosurgery Basic metabolic panel, LFTs, and CBC ordered CT head report reviewed    Assessment and Plan: * Intracerebral hemorrhage (HCC) CT head shows few small foci of traumatic left frontal SAH and slightly enlarging left frontal IPH wtihout symptoms. - Stop aspirin  - Strict BP control, <160 mmHg - PRN labetaolol and hydralazine  -Neurochecks - Consult neurosurgery, appreciate  expertise - Repeat CT head in 6 hours    Chronic kidney disease, stage 3a (HCC) Cr stable relative to baseline  Diabetes mellitus with circulatory complication (HCC) Diet controlled.  Glucose okay here.  Okay to defer accuchecks and SSI here.  CAD (coronary artery disease) - Hold aspirin  - Continue metoprolol   Hypothyroidism - Continue levothyroxine  - Check TSH  Dementia without behavioral disturbance (HCC) At baseline, oriented to self only.  Uses a wheelchair.  FAST 7.  Lives in facility - Continue home donepezil , quetiapine , lorazepam   Hypertension, essential Strict BP control as above - Continue lisinopril , metoprolol          Advance Care Planning: DNR, confirmed with grandson  Consults: Neurosurgery, Dr. Claudene  Family  Communication: Grandson by phone  Severity of Illness: The appropriate patient status for this patient is OBSERVATION. Observation status is judged to be reasonable and necessary in order to provide the required intensity of service to ensure the patient's safety. The patient's presenting symptoms, physical exam findings, and initial radiographic and laboratory data in the context of their medical condition is felt to place them at decreased risk for further clinical deterioration. Furthermore, it is anticipated that the patient will be medically stable for discharge from the hospital within 2 midnights of admission.   Author: Lonni SHAUNNA Dalton, MD 08/02/2023 3:05 AM  For on call review [email or web address].

## 2023-08-02 NOTE — Discharge Summary (Signed)
 Physician Discharge Summary   Patient: Amanda [name] MRN: [id number] DOB: July 04, 1933  Admit date:     08/01/2023  Discharge date: 08/02/23  Discharge Physician: Cresencio Fairly   PCP: Patient, No Pcp Per   Recommendations at discharge:    Hospice at [facility] care  Discharge Diagnoses: Principal Problem:   Intracerebral hemorrhage (HCC) Active Problems:   Hypertension, essential   Dementia without behavioral disturbance (HCC)   Hypothyroidism   CAD (coronary artery disease)   Diabetes mellitus with circulatory complication (HCC)   Chronic kidney disease, stage 3a [facility])  Hospital Course: 89 y.o. F with dementia, lives in ALF, CAD, DM, HLD, carotid stenosis, hypothyroidism and CKD IIIa presented with fall out of her wheelchair.  Caveat that patient has advanced dementia and can provide no meaningful history.    Evidently was sitting in her wheelchair at her facility tonight, had a witnessed fall forward, struck her head, sustained a scalp laceration.  No changes to her health had been noticed prior to that.  In the ER, appeared to be at cognitive baseline.  CTH showed small SDH and small IPH.  At 6 hours, the IPH was enlarging, and so Neurogsurgery recommended BP target <160 mmHg and serial CT head.  Assessment and Plan: * Intracerebral hemorrhage (HCC) CT head shows few small foci of traumatic left frontal SAH and slightly enlarging left frontal IPH wtihout symptoms. - Stop aspirin  - d/w neurosurgery who recommends conservative mgmt - Repeat CT head scan from this morning looks pretty stable, patient has bifrontal contusions which although small and nonsurgical can be pretty debilitating, especially given her dementia history   Chronic kidney disease, stage 3a (HCC) Diabetes mellitus with circulatory complication (HCC) CAD (coronary artery disease) Hypothyroidism - Continue levothyroxine   Dementia without behavioral disturbance (HCC) At baseline, oriented to self  only.  Uses a wheelchair.  FAST 7.  Lives in facility - Continue home donepezil , quetiapine , lorazepam   Hypertension, essential   Plan to get back to [facility] care with [facility]         Consultants: Neurosurgery  Disposition: [facility] with [facility] Diet recommendation:  Discharge Diet Orders (From admission, onward)     Start     Ordered   08/02/23 0000  Diet - low sodium heart healthy        08/02/23 1058           Regular diet DISCHARGE MEDICATION: Allergies as of 08/02/2023       Reactions   Atorvastatin  Other (See Comments)   Muscle Pain   Sulfa Antibiotics         Medication List     STOP taking these medications    acetaminophen  650 MG CR tablet Commonly known as: TYLENOL    aspirin  81 MG chewable tablet   diclofenac  Sodium 1 % Gel Commonly known as: VOLTAREN    famotidine  20 MG tablet Commonly known as: Pepcid    Iron 325 (65 Fe) MG Tabs   lisinopril  20 MG tablet Commonly known as: ZESTRIL    loperamide 2 MG tablet Commonly known as: IMODIUM A-D   omeprazole 20 MG capsule Commonly known as: PRILOSEC   oxyCODONE -acetaminophen  5-325 MG tablet Commonly known as: PERCOCET/ROXICET   sucralfate  1 g tablet Commonly known as: Carafate    traMADol 50 MG tablet Commonly known as: ULTRAM       TAKE these medications    donepezil  10 MG tablet Commonly known as: ARICEPT  TAKE ONE TABLET AT BEDTIME.  levothyroxine  75 MCG tablet Commonly known as: SYNTHROID  TAKE ONE-HALF TABLET EVERY DAY What changed: See the new instructions.   LORazepam  0.5 MG tablet Commonly known as: ATIVAN  1 tablet in the am and 1/2 tablet q pm. What changed: additional instructions   Melatonin Maximum Strength 5 MG Tabs Generic drug: melatonin Take 10 mg by mouth at bedtime.   metoprolol  tartrate 25 MG tablet Commonly known as: LOPRESSOR  TAKE (1) TABLET BY MOUTH TWICE DAILY   morphine  CONCENTRATE 10 mg / 0.5 ml  concentrated solution Take 5 mg by mouth every hour as needed for severe pain (pain score 7-10) or shortness of breath.   ondansetron  4 MG tablet Commonly known as: ZOFRAN  Take 4 mg by mouth every 8 (eight) hours as needed for nausea or vomiting.   polyethylene glycol 17 g packet Commonly known as: MIRALAX  / GLYCOLAX  Take 17 g by mouth daily.   QUEtiapine  50 MG tablet Commonly known as: SEROQUEL  Take 50-75 mg by mouth at bedtime. Take 50 mg daily and 75 mg at bedtime. What changed: Another medication with the same name was removed. Continue taking this medication, and follow the directions you see here.   SYRINGE 3CC/25GX1 25G X 1 3 ML Misc Use as directed to administer b12 injections.   traZODone  50 MG tablet Commonly known as: DESYREL  Take 25 mg by mouth at bedtime.        Discharge Exam: Filed Weights   08/01/23 1831  Weight: 57.2 kg   89 yo cachectic looking elderly female, lying in bed, restless, mittens on hands, does not make eye contact - bruising over the left eye, and a laceration of the left scalp which is now hemostatic.  Oropharynx appears tacky dry, no oral lesions Heart: RRR, no murmurs, no peripheral edema, no JVD Lungs Respiratory rate normal, lung sounds normal without rales or wheezes Abdomen soft benign She resists and all 4 extremities, which seem to have good tone.     Condition at discharge: poor  The results of significant diagnostics from this hospitalization (including imaging, microbiology, ancillary and laboratory) are listed below for reference.   Imaging Studies: CT Head Wo Contrast Result Date: 08/02/2023 CLINICAL DATA:  89 year old female status post fall. Intracranial hemorrhage. Subsequent encounter. EXAM: CT HEAD WITHOUT CONTRAST TECHNIQUE: Contiguous axial images were obtained from the base of the skull through the vertex without intravenous contrast. RADIATION DOSE REDUCTION: This exam was performed according to the departmental  dose-optimization program which includes automated exposure control, adjustment of the mA and/or kV according to patient size and/or use of iterative reconstruction technique. COMPARISON:  Head CT 0105 hours today and 1853 hours yesterday. FINDINGS: Brain: Right greater than left anterior frontal lobe hemorrhagic contusions and/or small volume subarachnoid hemorrhage has not significantly changed since presentation, and there is no significant anterior frontal lobe mass effect. No midline shift. No ventriculomegaly. Basilar cisterns are normal. However, small volume right posterior convexity subdural hematoma (series 2, image 19), 3-4 mm) and mildly increased since presentation. No new areas of acute hemorrhage. Stable gray-white matter differentiation throughout the brain. No cortically based acute infarct identified. Vascular: Calcified atherosclerosis at the skull base. No suspicious intracranial vascular hyperdensity. Skull: Stable.  No acute fracture identified. Sinuses/Orbits: Layering fluid in the left sphenoid sinus has not significantly changed since presentation. No central skull base fracture is identified. Tympanic cavities and mastoids remain well aerated. Other: Left anterior convexity scalp soft tissue injury with swelling and gas. Underlying left frontal bone appears intact. Orbits soft  tissues appears stable and negative. IMPRESSION: 1. Coup contrecoup injury: Small right posterior convexity subdural hematoma has mildly increased since presentation, up to 4 mm. Right > left anterior frontal lobe small hemorrhagic contusions and subarachnoid hemorrhage not significantly changed. 2. No significant intracranial mass effect or midline shift. 3. No new intracranial abnormality. Electronically Signed   By: VEAR Hurst M.D.   On: 08/02/2023 09:00   CT HEAD WO CONTRAST ( ) Result Date: 08/02/2023 CLINICAL DATA:  Subarachnoid hemorrhage [facility]) 6 hour repeat EXAM: CT HEAD WITHOUT CONTRAST TECHNIQUE: Contiguous  axial images were obtained from the base of the skull through the vertex without intravenous contrast. RADIATION DOSE REDUCTION: This exam was performed according to the departmental dose-optimization program which includes automated exposure control, adjustment of the mA and/or kV according to patient size and/or use of iterative reconstruction technique. COMPARISON:  CT head 08/01/2023 6:52 p.m. FINDINGS: Brain: No evidence of large-territorial acute infarction. No mass lesion. Redemonstration of combination of bilateral frontal moderate volume subarachnoid and intraparenchymal hemorrhages, right greater than left intraparenchymal component on the right measures of to 8 mm (6:12) and on the left 7 cm (6:10). Blooming of the right frontal intraparenchymal hemorrhage (CT: 12). No mass effect or midline shift. No hydrocephalus. Basilar cisterns are patent. Vascular: No hyperdense vessel. Atherosclerotic calcifications are present within the cavernous internal carotid arteries. Skull: No acute fracture or focal lesion. Sinuses/Orbits: Left sphenoid sinus mucosal thickening. Paranasal sinuses and mastoid air cells are clear. Bilateral lens replacement. Otherwise the orbits are unremarkable. Other: Left frontal scalp soft tissue defect with underlying subcutaneus soft tissue emphysema. IMPRESSION: Blooming right frontal intraparenchymal hemorrhage. Redemonstration of combination of bilateral frontal moderate volume subarachnoid and intraparenchymal hemorrhages, right greater than left. No herniation. Electronically Signed   By: Morgane  Naveau M.D.   On: 08/02/2023 01:37   CT Head Wo Contrast Result Date: 08/01/2023 CLINICAL DATA:  Clemens out of chair laceration to the head EXAM: CT HEAD WITHOUT CONTRAST TECHNIQUE: Contiguous axial images were obtained from the base of the skull through the vertex without intravenous contrast. RADIATION DOSE REDUCTION: This exam was performed according to the departmental  dose-optimization program which includes automated exposure control, adjustment of the mA and/or kV according to patient size and/or use of iterative reconstruction technique. COMPARISON:  CT brain 02/13/2023 FINDINGS: Brain: No acute territorial infarction or intracranial mass is visualized. Multifocal scattered foci of bilateral extra-axial/subarachnoid blood involving the right greater than left frontal lobes and anterior to the left temporal lobe. Small focus of extra-axial blood at the left anterior cranial vertex, series 3, image 22. Trace volume right subdural hematoma at the cranial vertex measures 3 mm maximum on coronal series 4, image 42, and suspect also trace 1-2 mm subdural blood along the right convexity on coronal series 4, image 39. Moderate atrophy. Mild chronic small vessel ischemic changes of the white matter. Stable ventricle size. Vascular: No hyperdense vessels.  Carotid vascular calcification Skull: No fracture Sinuses/Orbits: Mucosal thickening and fluid in the sphenoid sinuses. Other: Left forehead scalp laceration. Traumatic Brain Injury Risk Stratification Skull Fracture: No - Low/mBIG 1 Subdural Hematoma (SDH): <45mm - mBIG 1 Subarachnoid Hemorrhage [facility]): multifocal, bilateral - High/mBIG 3 Epidural Hematoma (EDH): No - Low/mBIG 1 Cerebral contusion, intra-axial, intraparenchymal Hemorrhage (IPH): No Intraventricular Hemorrhage (IVH): No - Low/mBIG 1 Midline Shift > 1mm or Edema/effacement of sulci/vents: No - Low/mBIG 1 ---------------------------------------------------- IMPRESSION: 1. Multifocal scattered foci of bilateral extra-axial/subarachnoid blood involving the right greater than left frontal lobes and anterior to  the left temporal lobe. Small focus of extra-axial blood at the left anterior cranial vertex. Trace volume right subdural hematoma measuring up to 3 mm at the cranial vertex and suspect also trace 1-2 mm subdural blood along the right convexity. No significant mass  effect or midline shift. 2. Atrophy and chronic small vessel ischemic changes of the white matter. Critical Value/emergent results were called by telephone at the time of interpretation on 08/01/2023 at 7:19 pm to provider KEVIN PADUCHOWSKI , who verbally acknowledged these results. Electronically Signed   By: Luke Bun M.D.   On: 08/01/2023 19:19    Microbiology: Results for orders placed or performed during the hospital encounter of 09/15/21  Urine Culture     Status: Abnormal   Collection Time: 09/16/21  1:22 AM   Specimen: Urine, Clean Catch  Result Value Ref Range Status   Specimen Description   Final    URINE, CLEAN CATCH Performed at [facility], [address] [place], [place] [address]    Special Requests   Final    NONE Performed at [facility], [address] [place], [place] [address]    Culture MULTIPLE SPECIES PRESENT, SUGGEST RECOLLECTION (A)  Final   Report Status 09/17/2021 FINAL  Final    Labs: CBC: Recent Labs  Lab 08/02/23 0248  WBC 8.2  NEUTROABS 6.5  HGB 10.2*  HCT 31.8*  MCV 86.9  PLT 278   Basic Metabolic Panel: Recent Labs  Lab 08/02/23 0248  NA 140  K 3.4*  CL 105  CO2 24  GLUCOSE 126*  BUN 28*  CREATININE 0.95  CALCIUM  9.5   Liver Function Tests: Recent Labs  Lab 08/02/23 0248  AST 22  ALT 19  ALKPHOS 87  BILITOT 1.1  PROT 6.3*  ALBUMIN 3.5   CBG: No results for input(s): GLUCAP in the last 168 hours.  Discharge time spent: greater than 30 minutes.  Signed: Cresencio Fairly, MD [facility] 08/02/2023

## 2023-08-02 NOTE — Assessment & Plan Note (Signed)
 Continue levothyroxine.  Check TSH

## 2023-08-02 NOTE — IPAL (Signed)
  Interdisciplinary Goals of Care Family Meeting   Date carried out: 08/02/2023  Location of the meeting: Bedside  Member's involved: Physician and Family Member or next of kin/Grandson  Durable Power of Insurance risk surveyor: Husband    Discussion: We discussed goals of care for [employer] .    Code status:   Code Status: Limited: Do not attempt resuscitation (DNR) -DNR-LIMITED -Do Not Intubate/DNI    Disposition: Home with Hospice/[place] with [facility]  Time spent for the meeting: 35 mins    Cresencio Fairly, MD  08/02/2023, 11:06 AM

## 2023-08-02 NOTE — Care Management Obs Status (Signed)
 MEDICARE OBSERVATION STATUS NOTIFICATION   Patient Details  Name: Amanda [name] MRN: [id number] Date of Birth: [birth year]   Medicare Observation Status Notification Given:  Yes    Doyne Ellinger I Lynden Flemmer, LCSW 08/02/2023, 10:07 AM

## 2023-08-02 NOTE — Evaluation (Signed)
 Physical Therapy Evaluation Patient Details Name: Amanda [name] MRN: [id number] DOB: [birth year] Today's Date: 08/02/2023  History of Present Illness  89 y/o female presented to ED on 08/01/23 for fall out of wheelchair and hit head. CTH showed small SDH and small IPH, however IPH enlarging. PMH: advanced dementia, HTN, CKD IIIa, carotid stenosis, CAD, DM  Clinical Impression  Patient admitted with the above. PTA, patient lives at [facility] facility and required assistance from staff to transfer in/out of wheelchair in which she is typically bound to due to frequent falls when ambulating. Clearance obtained from MD prior to session. Patient lethargic on arrival and grandson present. Not following commands during session. Required totalA+2 for bed mobility and able to sit EOB with minA. At end of session, Dr. Maree meeting with grandson with discussion of transitioning to hospice and requesting to sign off. No further skilled PT needs identified acutely. PT will sign off.       If plan is discharge home, recommend the following:     Can travel by private vehicle   No    Equipment Recommendations None recommended by PT  Recommendations for Other Services       Functional Status Assessment Patient has had a recent decline in their functional status and/or demonstrates limited ability to make significant improvements in function in a reasonable and predictable amount of time     Precautions / Restrictions Precautions Precautions: Fall Recall of Precautions/Restrictions: Impaired Restrictions Weight Bearing Restrictions Per Provider Order: No      Mobility  Bed Mobility Overal bed mobility: Needs Assistance Bed Mobility: Supine to Sit, Sit to Supine     Supine to sit: Total assist, +2 for physical assistance, +2 for safety/equipment Sit to supine: Total assist, +2 for physical assistance, +2 for safety/equipment        Transfers                   General  transfer comment: deferred    Ambulation/Gait                  Stairs            Wheelchair Mobility     Tilt Bed    Modified Rankin (Stroke Patients Only)       Balance                                             Pertinent Vitals/Pain Pain Assessment Pain Assessment: Faces Faces Pain Scale: No hurt Pain Intervention(s): Monitored during session    Home Living Family/patient expects to be discharged to:: Skilled nursing facility                   Additional Comments: at [facility]    Prior Function Prior Level of Function : Needs assist             Mobility Comments: assist from staff to transfer to/from w/c. Primarily w/c bound due to frequent falls when attempting to walk ADLs Comments: assist from staff     Extremity/Trunk Assessment   Upper Extremity Assessment Upper Extremity Assessment: Generalized weakness    Lower Extremity Assessment Lower Extremity Assessment: Generalized weakness       Communication   Communication Communication: Impaired    Cognition Arousal: Lethargic Behavior During Therapy: Flat affect   PT -  Cognitive impairments: History of cognitive impairments                         Following commands: Impaired Following commands impaired: Follows one step commands with increased time, Follows one step commands inconsistently     Cueing       General Comments      Exercises     Assessment/Plan    PT Assessment Patient does not need any further PT services  PT Problem List         PT Treatment Interventions      PT Goals (Current goals can be found in the Care Plan section)  Acute Rehab PT Goals Patient Stated Goal: did not state PT Goal Formulation: All assessment and education complete, DC therapy    Frequency       Co-evaluation               AM-PAC PT 6 Clicks Mobility  Outcome Measure Help needed turning from your back to  your side while in a flat bed without using bedrails?: Total Help needed moving from lying on your back to sitting on the side of a flat bed without using bedrails?: Total Help needed moving to and from a bed to a chair (including a wheelchair)?: Total Help needed standing up from a chair using your arms (e.g., wheelchair or bedside chair)?: Total Help needed to walk in hospital room?: Total Help needed climbing 3-5 steps with a railing? : Total 6 Click Score: 6    End of Session   Activity Tolerance: Patient limited by lethargy Patient left: in bed;with call bell/phone within reach;with bed alarm set;with family/visitor present Nurse Communication: Mobility status PT Visit Diagnosis: Muscle weakness (generalized) (M62.81)    Time: 8972-8959 PT Time Calculation (min) (ACUTE ONLY): 13 min   Charges:   PT Evaluation $PT Eval Moderate Complexity: 1 Mod   PT General Charges $$ ACUTE PT VISIT: 1 Visit         Maryanne Finder, PT, DPT Physical Therapist - [facility]  [facility]   Abisai Coble A Eural Holzschuh 08/02/2023, 11:54 AM

## 2023-08-02 NOTE — Hospital Course (Addendum)
 89 y.o. F with dementia, lives in ALF, CAD, DM, HLD, carotid stenosis, hypothyroidism and CKD IIIa presented with fall out of her wheelchair.  Caveat that patient has advanced dementia and can provide no meaningful history.    Evidently was sitting in her wheelchair at her facility tonight, had a witnessed fall forward, struck her head, sustained a scalp laceration.  No changes to her health had been noticed prior to that.  In the ER, appeared to be at cognitive baseline.  CTH showed small SDH and small IPH.  At 6 hours, the IPH was enlarging, and so Neurogsurgery recommended BP target <160 mmHg and serial CT head.

## 2023-08-02 NOTE — Progress Notes (Signed)
 OT Cancellation Note  Patient Details Name: Amanda [name] MRN: [id number] DOB: 1933/11/06   Cancelled Treatment:    Reason Eval/Treat Not Completed: Other (comment). Consult received, chart reviewed. Spoke with PT. Per Dr. Maree following PT evaluation, pt transitioning to hospice. Therapy to sign off. Please re-consult if additional needs arise.   [name] R., MPH, MS, OTR/L ascom [phone] 08/02/23, 10:47 AM

## 2023-08-02 NOTE — Consult Note (Signed)
 Consulting Department:  Inpatient medicine  Primary Physician:  Patient, No Pcp Per  Chief Complaint: Fall with intracranial hemorrhage  History of Present Illness: 08/02/2023 Amanda [name] is a 89 y.o. female who presents with the chief complaint of fall with an intracranial hemorrhage.  She has a complex past medical history including advanced dementia, hypertension, kidney disease, carotid stenosis, CAD, diabetes.  She fell while in transfer.  And was brought in.  Was quite sleepy during her admission.  Got head CT which demonstrated blooming bifrontal hematomas.  No reports of seizures.  Review of Systems:  A 10 point review of systems is negative, except for the pertinent positives and negatives detailed in the HPI.  Past Medical History: Past Medical History:  Diagnosis Date   Carotid bruit    bilaterally   Dementia (HCC)    Hypercholesterolemia    Hypertension    Hypothyroidism     Past Surgical History: Past Surgical History:  Procedure Laterality Date   CARPAL TUNNEL RELEASE     CHOLECYSTECTOMY  1991   CORONARY/GRAFT ACUTE MI REVASCULARIZATION N/A 01/21/2019   Procedure: Coronary/Graft Acute MI Revascularization;  Surgeon: Florencio Cara BIRCH, MD;  Location: [facility] INVASIVE CV LAB;  Service: Cardiovascular;  Laterality: N/A;   LEFT HEART CATH AND CORONARY ANGIOGRAPHY N/A 01/21/2019   Procedure: LEFT HEART CATH AND CORONARY ANGIOGRAPHY;  Surgeon: Florencio Cara BIRCH, MD;  Location: [facility] INVASIVE CV LAB;  Service: Cardiovascular;  Laterality: N/A;   THORACIC AORTA STENT  12/13   heart stent    Allergies: Allergies as of 08/01/2023 - Review Complete 08/01/2023  Allergen Reaction Noted   Atorvastatin  Other (See Comments) 02/03/2019   Sulfa antibiotics  11/11/2011    Medications:  Current Facility-Administered Medications:    acetaminophen  (TYLENOL ) tablet 650 mg, 650 mg, Oral, Q6H PRN **OR** acetaminophen  (TYLENOL ) suppository 650 mg, 650 mg, Rectal, Q6H PRN, Danford,  Lonni SQUIBB, MD   donepezil  (ARICEPT ) tablet 10 mg, 10 mg, Oral, QHS, Danford, Lonni SQUIBB, MD   hydrALAZINE  (APRESOLINE ) injection 5 mg, 5 mg, Intravenous, Q6H PRN, Danford, Lonni SQUIBB, MD   labetalol  (NORMODYNE ) injection 10 mg, 10 mg, Intravenous, Q2H PRN, Danford, Lonni SQUIBB, MD   levothyroxine  (SYNTHROID ) tablet 75 mcg, 75 mcg, Oral, Q0600, Danford, Lonni SQUIBB, MD   lisinopril  (ZESTRIL ) tablet 20 mg, 20 mg, Oral, Daily, Danford, Lonni SQUIBB, MD, 20 mg at 08/02/23 0913   LORazepam  (ATIVAN ) tablet 0.5 mg, 0.5 mg, Oral, BID, Danford, Lonni SQUIBB, MD, 0.5 mg at 08/02/23 9086   metoprolol  tartrate (LOPRESSOR ) tablet 25 mg, 25 mg, Oral, BID, Danford, Lonni SQUIBB, MD, 25 mg at 08/02/23 9086   morphine  CONCENTRATE 10 mg / 0.5 ml oral solution 5 mg, 5 mg, Oral, Q1H PRN, Danford, Lonni SQUIBB, MD   ondansetron  (ZOFRAN ) tablet 4 mg, 4 mg, Oral, Q6H PRN **OR** ondansetron  (ZOFRAN ) injection 4 mg, 4 mg, Intravenous, Q6H PRN, Danford, Lonni SQUIBB, MD   oxyCODONE -acetaminophen  (PERCOCET/ROXICET) 5-325 MG per tablet 1 tablet, 1 tablet, Oral, Q6H PRN, Danford, Lonni SQUIBB, MD   polyethylene glycol (MIRALAX  / GLYCOLAX ) packet 17 g, 17 g, Oral, Daily, Danford, Lonni SQUIBB, MD, 17 g at 08/02/23 0913   QUEtiapine  (SEROQUEL ) tablet 50 mg, 50 mg, Oral, q morning, Danford, Lonni SQUIBB, MD, 50 mg at 08/02/23 0913   QUEtiapine  (SEROQUEL ) tablet 75 mg, 75 mg, Oral, QHS, Shah, Vipul, MD   traZODone  (DESYREL ) tablet 25 mg, 25 mg, Oral, QHS, Danford, Lonni SQUIBB, MD   Social History: Social History   Tobacco  Use   Smoking status: Never   Smokeless tobacco: Never  Vaping Use   Vaping status: Never Used  Substance Use Topics   Alcohol use: No    Alcohol/week: 0.0 standard drinks of alcohol   Drug use: No    Family Medical History: Family History  Problem Relation Age of Onset   Hypertension Father    Lung cancer Father    Hypertension Mother    Dementia Mother    Arthritis  Other    Breast cancer Cousin        mat cousin   Colon cancer Neg Hx     Physical Examination: Vitals:   08/02/23 0806 08/02/23 1135  BP: (!) 131/48   Pulse:    Resp:    Temp: 97.6 F (36.4 C) 98.2 F (36.8 C)  SpO2: 99% 98%     General: Patient is well developed, well nourished, calm, collected, and in no apparent distress.  NEUROLOGICAL: Patient is resting comfortably in bed, spontaneously moving her extremities.  Per family members wishes did not perform a more invasive physical examination.  Imaging: CT Head Wo Contrast Result Date: 08/02/2023 CLINICAL DATA:  89 year old female status post fall. Intracranial hemorrhage. Subsequent encounter. EXAM: CT HEAD WITHOUT CONTRAST TECHNIQUE: Contiguous axial images were obtained from the base of the skull through the vertex without intravenous contrast. RADIATION DOSE REDUCTION: This exam was performed according to the departmental dose-optimization program which includes automated exposure control, adjustment of the mA and/or kV according to patient size and/or use of iterative reconstruction technique. COMPARISON:  Head CT 0105 hours today and 1853 hours yesterday. FINDINGS: Brain: Right greater than left anterior frontal lobe hemorrhagic contusions and/or small volume subarachnoid hemorrhage has not significantly changed since presentation, and there is no significant anterior frontal lobe mass effect. No midline shift. No ventriculomegaly. Basilar cisterns are normal. However, small volume right posterior convexity subdural hematoma (series 2, image 19), 3-4 mm) and mildly increased since presentation. No new areas of acute hemorrhage. Stable gray-white matter differentiation throughout the brain. No cortically based acute infarct identified. Vascular: Calcified atherosclerosis at the skull base. No suspicious intracranial vascular hyperdensity. Skull: Stable.  No acute fracture identified. Sinuses/Orbits: Layering fluid in the left sphenoid  sinus has not significantly changed since presentation. No central skull base fracture is identified. Tympanic cavities and mastoids remain well aerated. Other: Left anterior convexity scalp soft tissue injury with swelling and gas. Underlying left frontal bone appears intact. Orbits soft tissues appears stable and negative. IMPRESSION: 1. Coup contrecoup injury: Small right posterior convexity subdural hematoma has mildly increased since presentation, up to 4 mm. Right > left anterior frontal lobe small hemorrhagic contusions and subarachnoid hemorrhage not significantly changed. 2. No significant intracranial mass effect or midline shift. 3. No new intracranial abnormality. Electronically Signed   By: VEAR Hurst M.D.   On: 08/02/2023 09:00   CT HEAD WO CONTRAST ( ) Result Date: 08/02/2023 CLINICAL DATA:  Subarachnoid hemorrhage [facility]) 6 hour repeat EXAM: CT HEAD WITHOUT CONTRAST TECHNIQUE: Contiguous axial images were obtained from the base of the skull through the vertex without intravenous contrast. RADIATION DOSE REDUCTION: This exam was performed according to the departmental dose-optimization program which includes automated exposure control, adjustment of the mA and/or kV according to patient size and/or use of iterative reconstruction technique. COMPARISON:  CT head 08/01/2023 6:52 p.m. FINDINGS: Brain: No evidence of large-territorial acute infarction. No mass lesion. Redemonstration of combination of bilateral frontal moderate volume subarachnoid and intraparenchymal hemorrhages, right greater than  left intraparenchymal component on the right measures of to 8 mm (6:12) and on the left 7 cm (6:10). Blooming of the right frontal intraparenchymal hemorrhage (CT: 12). No mass effect or midline shift. No hydrocephalus. Basilar cisterns are patent. Vascular: No hyperdense vessel. Atherosclerotic calcifications are present within the cavernous internal carotid arteries. Skull: No acute fracture or focal lesion.  Sinuses/Orbits: Left sphenoid sinus mucosal thickening. Paranasal sinuses and mastoid air cells are clear. Bilateral lens replacement. Otherwise the orbits are unremarkable. Other: Left frontal scalp soft tissue defect with underlying subcutaneus soft tissue emphysema. IMPRESSION: Blooming right frontal intraparenchymal hemorrhage. Redemonstration of combination of bilateral frontal moderate volume subarachnoid and intraparenchymal hemorrhages, right greater than left. No herniation. Electronically Signed   By: Morgane  Naveau M.D.   On: 08/02/2023 01:37   CT Head Wo Contrast Result Date: 08/01/2023 CLINICAL DATA:  Clemens out of chair laceration to the head EXAM: CT HEAD WITHOUT CONTRAST TECHNIQUE: Contiguous axial images were obtained from the base of the skull through the vertex without intravenous contrast. RADIATION DOSE REDUCTION: This exam was performed according to the departmental dose-optimization program which includes automated exposure control, adjustment of the mA and/or kV according to patient size and/or use of iterative reconstruction technique. COMPARISON:  CT brain 02/13/2023 FINDINGS: Brain: No acute territorial infarction or intracranial mass is visualized. Multifocal scattered foci of bilateral extra-axial/subarachnoid blood involving the right greater than left frontal lobes and anterior to the left temporal lobe. Small focus of extra-axial blood at the left anterior cranial vertex, series 3, image 22. Trace volume right subdural hematoma at the cranial vertex measures 3 mm maximum on coronal series 4, image 42, and suspect also trace 1-2 mm subdural blood along the right convexity on coronal series 4, image 39. Moderate atrophy. Mild chronic small vessel ischemic changes of the white matter. Stable ventricle size. Vascular: No hyperdense vessels.  Carotid vascular calcification Skull: No fracture Sinuses/Orbits: Mucosal thickening and fluid in the sphenoid sinuses. Other: Left forehead scalp  laceration. Traumatic Brain Injury Risk Stratification Skull Fracture: No - Low/mBIG 1 Subdural Hematoma (SDH): <78mm - mBIG 1 Subarachnoid Hemorrhage [facility]): multifocal, bilateral - High/mBIG 3 Epidural Hematoma (EDH): No - Low/mBIG 1 Cerebral contusion, intra-axial, intraparenchymal Hemorrhage (IPH): No Intraventricular Hemorrhage (IVH): No - Low/mBIG 1 Midline Shift > 1mm or Edema/effacement of sulci/vents: No - Low/mBIG 1 ---------------------------------------------------- IMPRESSION: 1. Multifocal scattered foci of bilateral extra-axial/subarachnoid blood involving the right greater than left frontal lobes and anterior to the left temporal lobe. Small focus of extra-axial blood at the left anterior cranial vertex. Trace volume right subdural hematoma measuring up to 3 mm at the cranial vertex and suspect also trace 1-2 mm subdural blood along the right convexity. No significant mass effect or midline shift. 2. Atrophy and chronic small vessel ischemic changes of the white matter. Critical Value/emergent results were called by telephone at the time of interpretation on 08/01/2023 at 7:19 pm to provider KEVIN PADUCHOWSKI , who verbally acknowledged these results. Electronically Signed   By: Luke Bun M.D.   On: 08/01/2023 19:19     I have personally reviewed the images and agree with the above interpretation.  Labs:    Latest Ref Rng & Units 08/02/2023    2:48 AM 11/17/2021    4:56 PM 09/15/2021   10:23 PM  CBC  WBC 4.0 - 10.5 K/uL 8.2  14.2  8.2   Hemoglobin 12.0 - 15.0 g/dL 89.7  86.6  88.1   Hematocrit 36.0 - 46.0 % 31.8  41.2  38.7   Platelets 150 - 400 K/uL 278  441  301       Latest Ref Rng & Units 08/02/2023    2:48 AM 11/17/2021    4:56 PM 09/15/2021   10:23 PM  BMP  Glucose 70 - 99 mg/dL 873  872  847   BUN 8 - 23 mg/dL 28  52  31   Creatinine 0.44 - 1.00 mg/dL 9.04  8.54  8.82   Sodium 135 - 145 mmol/L 140  135  140   Potassium 3.5 - 5.1 mmol/L 3.4  5.7  4.3   Chloride 98 - 111  mmol/L 105  100  107   CO2 22 - 32 mmol/L 24  24  27    Calcium  8.9 - 10.3 mg/dL 9.5  89.9  8.9     INR  1.1 (07/06 0248)   Assessment and Plan: Ms. Chisum is a pleasant 89 y.o. female with advanced dementia and multiple other comorbidities.  Had a fall from transfer, struck her head.  Came in lethargic.  CTs demonstrated bifrontal contusions.  These blossomed on repeat CT scan.  They are nonsurgical.  Physical examination patient is somnolent but prior to admission was having progressive decline in her functional status.  I discussed with the family that bifrontal contusions are often debilitating even in people with a more robust baseline.  They have made the decision to have her transferred back to her memory care facility.  They expect that she will likely continue to decline from multiple issues in the near future.  They do not want follow-up in neurosurgery clinic at this time.  Would not want to go forward with any surgical interventions understandably.  Penne MICAEL Sharps, MD/MSCR Dept. of Neurosurgery

## 2023-08-02 NOTE — Assessment & Plan Note (Signed)
Cr stable relative to baseline 

## 2023-08-02 NOTE — Assessment & Plan Note (Signed)
 At baseline, oriented to self only.  Uses a wheelchair.  FAST 7.  Lives in facility - Continue home donepezil , quetiapine , lorazepam 

## 2023-08-02 NOTE — Assessment & Plan Note (Signed)
 Strict BP control as above - Continue lisinopril , metoprolol 

## 2023-08-02 NOTE — NC FL2 (Signed)
 [place]  MEDICAID FL2 LEVEL OF CARE FORM     IDENTIFICATION  Patient Name: Amanda [name] Birthdate: February 27, 1933 Sex: female Admission Date (Current Location): 08/01/2023  [facility] and [place] Number:  Chiropodist and Address:  [facility], [address] [place], [place] [address]      Provider Number: [id number]  Attending Physician Name and Address:  Maree Hue, MD  Relative Name and Phone Number:  Anacker,Richard Amedeo)  [phone] [facility])    Current Level of Care:  (Assisted Living Community) Recommended Level of Care: Assisted Living Facility Prior Approval Number:    Date Approved/Denied:   PASRR Number:    Discharge Plan: Other (Comment) (ALF)    Current Diagnoses: Patient Active Problem List   Diagnosis Date Noted   Intracerebral hemorrhage (HCC) 08/02/2023   Low back pain 09/30/2021   Nondisplaced fracture of distal phalanx of left middle finger, initial encounter for closed fracture 06/18/2021   Senile dementia, delirium, with behavioral disturbance (HCC) 06/18/2021   Finger infection 06/17/2021   COVID-19 virus infection    Chronic kidney disease, stage 3a (HCC)    Aortic atherosclerosis (HCC) 08/03/2020   Renal cyst 02/01/2019   Anemia 02/01/2019   Acute ST elevation myocardial infarction (STEMI) of inferior wall [facility])    STEMI involving right coronary artery (HCC) 01/21/2019   Unstable angina pectoris (HCC) 01/19/2019   Abdominal bruit 09/14/2017   Muscle ache 03/06/2017   Fall 12/31/2014   Hospice care 05/20/2014   Diabetes mellitus with circulatory complication (HCC) 05/17/2013   CAD (coronary artery disease) 05/15/2012   Osteopenia 12/14/2011   Hypertension, essential 11/13/2011   Hypercholesteremia 11/13/2011   Dementia without behavioral disturbance (HCC) 11/13/2011   Hypothyroidism 11/13/2011    Orientation RESPIRATION BLADDER Height & Weight        Normal Incontinent Weight: 126 lb 1.6 oz (57.2  kg) Height:  5' 4 (162.6 cm)  BEHAVIORAL SYMPTOMS/MOOD NEUROLOGICAL BOWEL NUTRITION STATUS      Incontinent Diet  AMBULATORY STATUS COMMUNICATION OF NEEDS Skin   Extensive Assist Verbally (Dificulty communicating) Skin abrasions (Face Buttocks)                       Personal Care Assistance Level of Assistance  Bathing, Feeding, Dressing Bathing Assistance: Maximum assistance Feeding assistance: Maximum assistance Dressing Assistance: Maximum assistance     Functional Limitations Info  Sight, Hearing, Speech Sight Info: Impaired Hearing Info: Impaired Speech Info: Impaired    SPECIAL CARE FACTORS FREQUENCY                       Contractures Contractures Info: Not present    Additional Factors Info  Code Status, Allergies Code Status Info: DNR Allergies Info: Allergies: Atorvastatin , Sulfa Antibiotics           Current Medications (08/02/2023):  This is the current hospital active medication list Current Facility-Administered Medications  Medication Dose Route Frequency Provider Last Rate Last Admin   acetaminophen  (TYLENOL ) tablet 650 mg  650 mg Oral Q6H PRN Danford, Lonni SQUIBB, MD       Or   acetaminophen  (TYLENOL ) suppository 650 mg  650 mg Rectal Q6H PRN Danford, Lonni SQUIBB, MD       donepezil  (ARICEPT ) tablet 10 mg  10 mg Oral QHS Danford, Lonni SQUIBB, MD       hydrALAZINE  (APRESOLINE ) injection 5 mg  5 mg Intravenous Q6H PRN Danford, Lonni SQUIBB, MD  labetalol  (NORMODYNE ) injection 10 mg  10 mg Intravenous Q2H PRN Danford, Lonni SQUIBB, MD       levothyroxine  (SYNTHROID ) tablet 75 mcg  75 mcg Oral Q0600 Jonel Lonni SQUIBB, MD       lisinopril  (ZESTRIL ) tablet 20 mg  20 mg Oral Daily Danford, Lonni SQUIBB, MD   20 mg at 08/02/23 0913   LORazepam  (ATIVAN ) tablet 0.5 mg  0.5 mg Oral BID Jonel Lonni SQUIBB, MD   0.5 mg at 08/02/23 0913   metoprolol  tartrate (LOPRESSOR ) tablet 25 mg  25 mg Oral BID Jonel Lonni SQUIBB, MD   25 mg  at 08/02/23 0913   morphine  CONCENTRATE 10 mg / 0.5 ml oral solution 5 mg  5 mg Oral Q1H PRN Danford, Lonni SQUIBB, MD       ondansetron  (ZOFRAN ) tablet 4 mg  4 mg Oral Q6H PRN Danford, Lonni SQUIBB, MD       Or   ondansetron  (ZOFRAN ) injection 4 mg  4 mg Intravenous Q6H PRN Danford, Lonni SQUIBB, MD       oxyCODONE -acetaminophen  (PERCOCET/ROXICET) 5-325 MG per tablet 1 tablet  1 tablet Oral Q6H PRN Danford, Lonni SQUIBB, MD       polyethylene glycol (MIRALAX  / GLYCOLAX ) packet 17 g  17 g Oral Daily Danford, Christopher P, MD   17 g at 08/02/23 0913   QUEtiapine  (SEROQUEL ) tablet 50 mg  50 mg Oral q morning Jonel Lonni SQUIBB, MD   50 mg at 08/02/23 0913   QUEtiapine  (SEROQUEL ) tablet 75 mg  75 mg Oral QHS Maree Hue, MD       traZODone  (DESYREL ) tablet 25 mg  25 mg Oral QHS Danford, Lonni SQUIBB, MD         Discharge Medications: Please see discharge summary for a list of discharge medications.  Relevant Imaging Results:  Relevant Lab Results:   Additional Information [id number]  [name], LCSW

## 2023-08-02 NOTE — Assessment & Plan Note (Signed)
 Hold aspirin.  Continue metoprolol .

## 2023-08-21 ENCOUNTER — Encounter: Payer: Self-pay | Admitting: Emergency Medicine

## 2023-08-21 ENCOUNTER — Other Ambulatory Visit: Payer: Self-pay

## 2023-08-21 ENCOUNTER — Emergency Department
Admission: EM | Admit: 2023-08-21 | Discharge: 2023-08-21 | Disposition: A | Discharge status: Home / Self Care | Source: Skilled Nursing Facility | Attending: Emergency Medicine | Admitting: Emergency Medicine

## 2023-08-21 DIAGNOSIS — S0101XA Laceration without foreign body of scalp, initial encounter: Secondary | ICD-10-CM | POA: Diagnosis not present

## 2023-08-21 DIAGNOSIS — S0990XA Unspecified injury of head, initial encounter: Secondary | ICD-10-CM | POA: Diagnosis present

## 2023-08-21 DIAGNOSIS — Y92129 Unspecified place in nursing home as the place of occurrence of the external cause: Secondary | ICD-10-CM | POA: Diagnosis not present

## 2023-08-21 DIAGNOSIS — F03C0 Unspecified dementia, severe, without behavioral disturbance, psychotic disturbance, mood disturbance, and anxiety: Secondary | ICD-10-CM | POA: Diagnosis not present

## 2023-08-21 DIAGNOSIS — W19XXXA Unspecified fall, initial encounter: Secondary | ICD-10-CM | POA: Diagnosis not present

## 2023-08-21 MED ORDER — LIDOCAINE HCL (PF) 1 % IJ SOLN
5.0000 mL | Freq: Once | INTRAMUSCULAR | Status: DC
  Filled 2023-08-21: qty 5

## 2023-08-21 NOTE — ED Notes (Signed)
 Report to [facility], no transportation available.

## 2023-08-21 NOTE — Discharge Instructions (Signed)
 Laceration repaired with absorbable sutures which will come apart on their own in 2 weeks.

## 2023-08-21 NOTE — ED Triage Notes (Signed)
 Pt to ER via EMS from [facility] with unwitnessed fall. Laceration to left upper eye, bleeding controlled at this time.  Dr. Viviann to bedside, laceration repaired, no other noted injuries.

## 2023-08-21 NOTE — ED Provider Notes (Signed)
 [facility] Provider Note    Event Date/Time   First MD Initiated Contact with Patient 08/21/23 [phone]     (approximate)   History   Fall   HPI  Amanda [name] is a 89 y.o. female  With advanced dementia, wheelchair-bound who was sent to the ED from her memory care facility due to a fall to the floor.  Has a head laceration.  Patient has limited ability to provide any history but denies any pain.     Physical Exam   Triage Vital Signs: ED Triage Vitals  Encounter Vitals Group     BP      Girls Systolic BP Percentile      Girls Diastolic BP Percentile      Boys Systolic BP Percentile      Boys Diastolic BP Percentile      Pulse      Resp      Temp      Temp src      SpO2      Weight      Height      Head Circumference      Peak Flow      Pain Score      Pain Loc      Pain Education      Exclude from Growth Chart     Most recent vital signs: Vitals:   08/21/23 0753  BP: 137/68  Pulse: (!) 58  Resp: 18  Temp: 97.8 F (36.6 C)  SpO2: 96%    General: Awake, no distress.  CV:  Good peripheral perfusion.  Regular rate rhythm Resp:  Normal effort.  Abd:  No distention.  Soft nontender Other:  Moving all extremities.  No bony tenderness.  Full range of motion.  No spinal tenderness.  3 cm linear laceration just above the left eyebrow.  EOMI, PERRL   ED Results / Procedures / Treatments   Labs (all labs ordered are listed, but only abnormal results are displayed) Labs Reviewed - No data to display   RADIOLOGY    PROCEDURES:  .Laceration Repair  Date/Time: 08/21/2023 7:57 AM  Performed by: Viviann Pastor, MD Authorized by: Viviann Pastor, MD   Consent:    Consent obtained:  Emergent situation Universal protocol:    Patient identity confirmed:  Arm band Laceration details:    Location:  Scalp   Scalp location:  Frontal   Length (cm):  3 Pre-procedure details:    Preparation:  Patient was prepped and  draped in usual sterile fashion Exploration:    Hemostasis achieved with:  Direct pressure   Wound exploration: entire depth of wound visualized     Wound extent: fascia not violated, no foreign body, no signs of injury, no nerve damage, no tendon damage, no underlying fracture and no vascular damage   Treatment:    Area cleansed with:  Povidone-iodine and saline   Irrigation solution:  Sterile saline Skin repair:    Repair method:  Sutures   Suture size:  4-0   Suture material:  Fast-absorbing gut   Suture technique:  Running   Number of sutures:  6 Approximation:    Approximation:  Close Repair type:    Repair type:  Simple Post-procedure details:    Dressing:  Open (no dressing)   Procedure completion:  Tolerated well, no immediate complications    MEDICATIONS ORDERED IN ED: Medications  lidocaine  (PF) (XYLOCAINE ) 1 % injection 5 mL (has no administration in time  range)     IMPRESSION / MDM / ASSESSMENT AND PLAN / ED COURSE  I reviewed the triage vital signs and the nursing notes.                             Patient presents with forehead laceration after fall.  Neuroimaging would not be fruitful.  She is otherwise nontoxic with reassuring exam, doubt hip fracture, spinal injury or other significant trauma.  Wound cleaned and repaired with absorbable sutures      FINAL CLINICAL IMPRESSION(S) / ED DIAGNOSES   Final diagnoses:  Laceration of scalp, initial encounter  Severe dementia without behavioral disturbance, psychotic disturbance, mood disturbance, or anxiety, unspecified dementia type (HCC)     Rx / DC Orders   ED Discharge Orders     None        Note:  This document was prepared using Dragon voice recognition software and may include unintentional dictation errors.   Viviann Pastor, MD 08/21/23 [phone]

## 2023-10-23 DIAGNOSIS — R300 Dysuria: Secondary | ICD-10-CM | POA: Diagnosis not present

## 2023-12-18 IMAGING — DX DG HAND COMPLETE 3+V*L*
3 series · 3 of 3 positions shown · non-contrast
Comparison: None Available.

CLINICAL DATA: Middle finger injury.

EXAM:
LEFT HAND - COMPLETE 3+ VIEW

[hand ap]
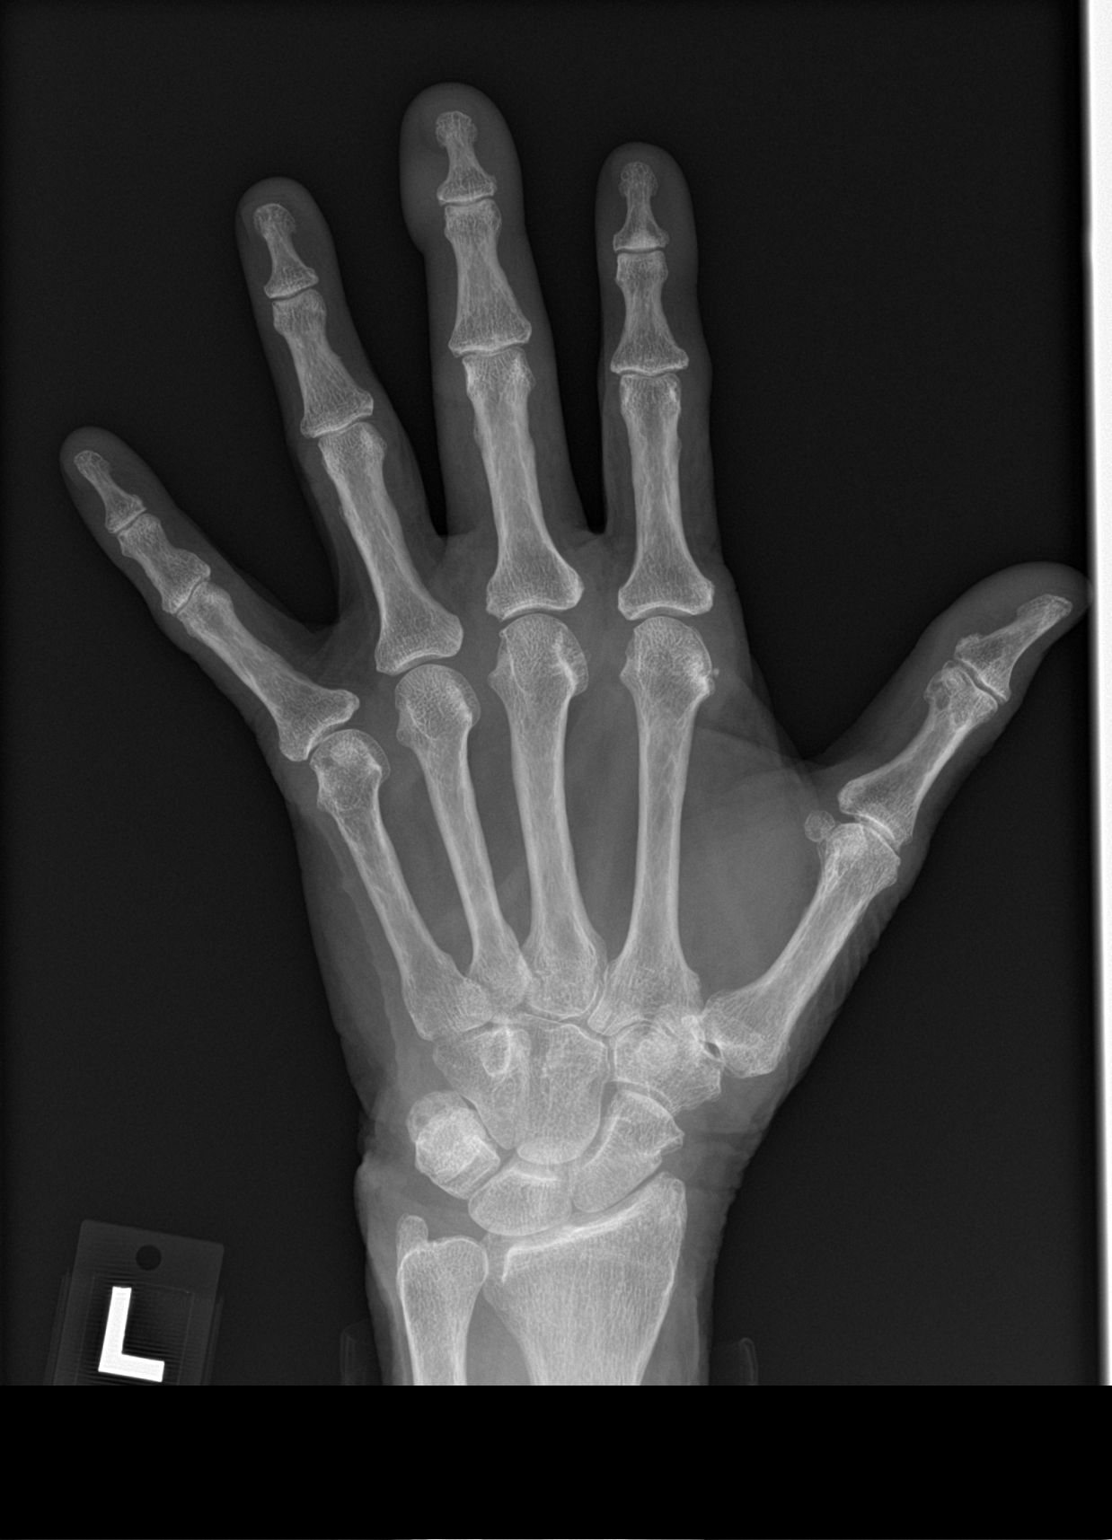

[hand obl]
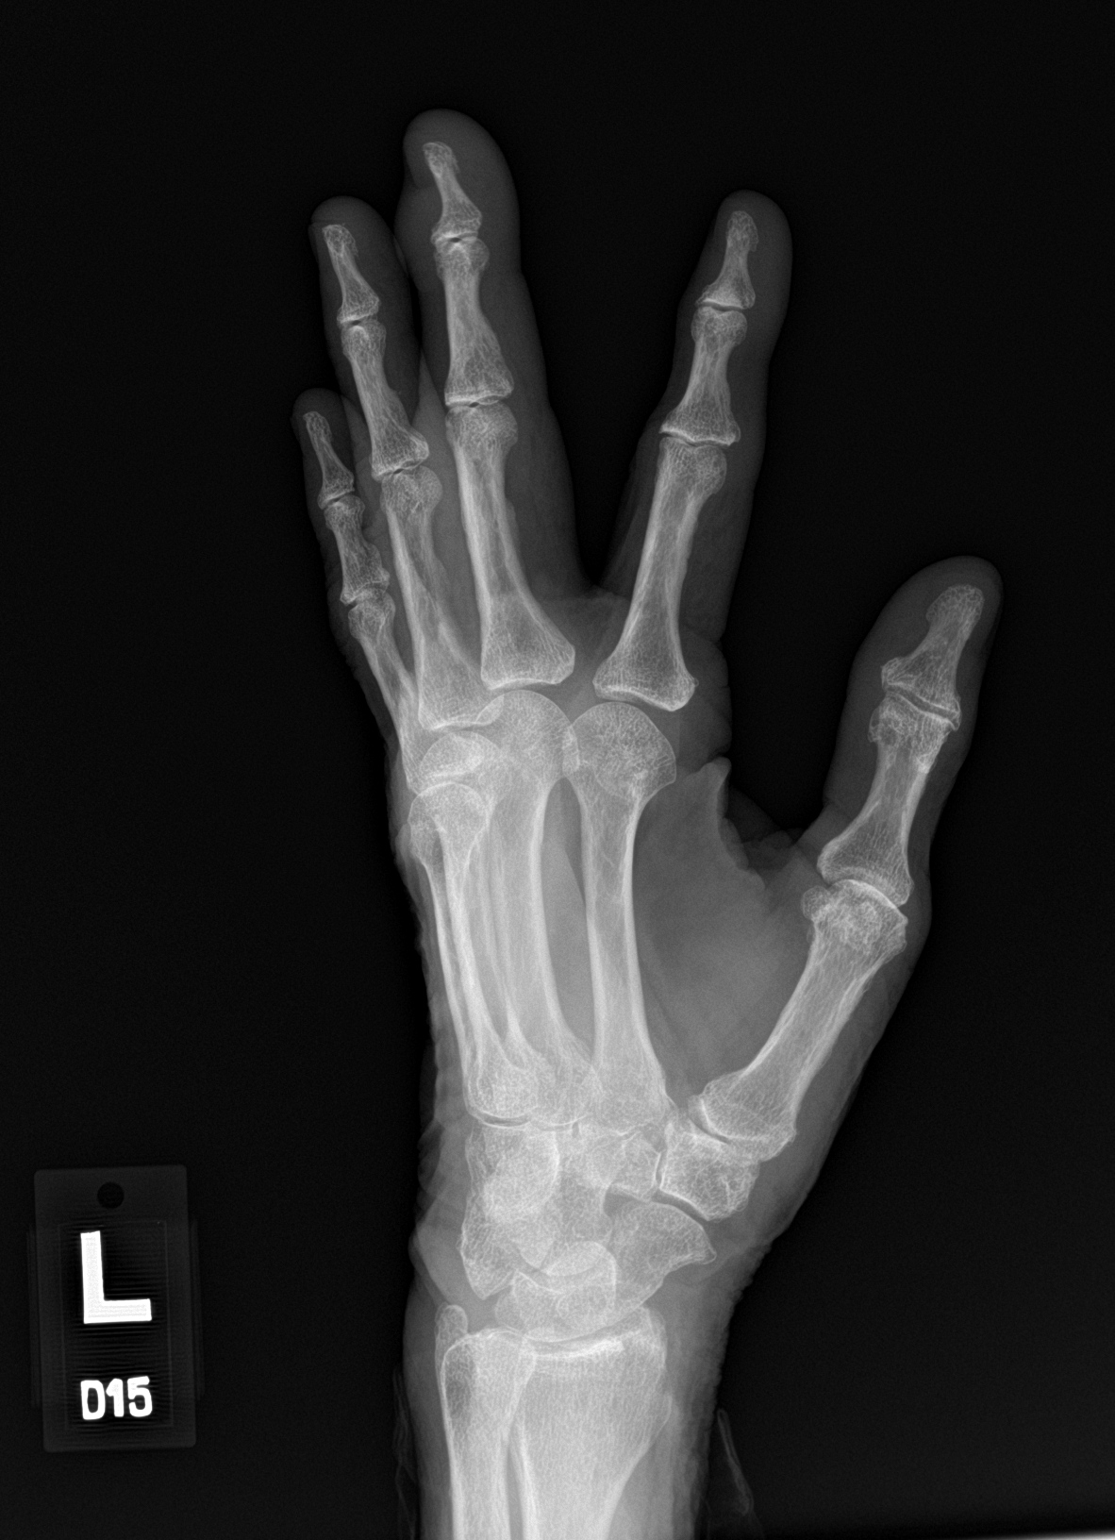

[hand lat]
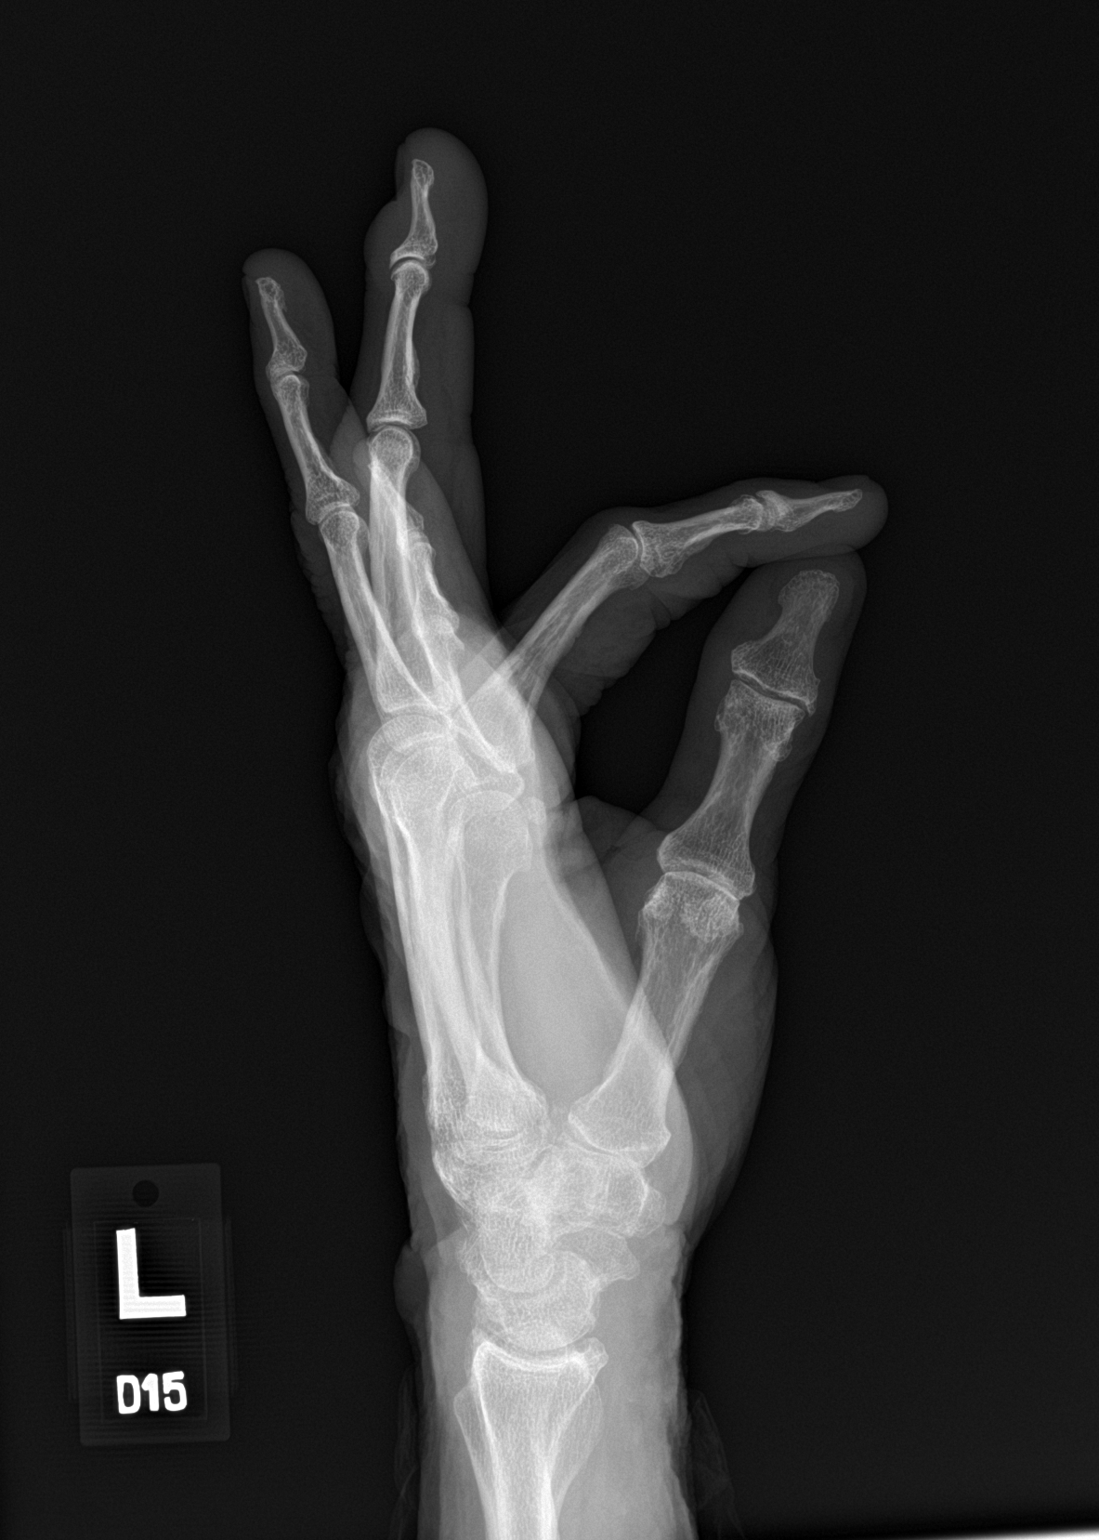

[3 of 3 positions shown; findings below may reference images not displayed]

FINDINGS: Cortical irregularity at the volar base of the third distal phalanx.
No dislocation. Mild osteoarthritis of the first CMC joint and first
IP joint. Asymmetric soft tissue swelling of the distal long finger.
No radiopaque foreign body identified.
IMPRESSION: 1. Asymmetric soft tissue swelling of the distal long finger.
Cortical irregularity at the volar base of the third distal phalanx
may reflect a tiny avulsion fracture.
